# Patient Record
Sex: Male | Born: 1960 | Race: White | Hispanic: No | Marital: Married | Smoking: Former smoker
Health system: Southern US, Community
[De-identification: ages and names within clinical notes are randomized; demographics above are authoritative.]

## PROBLEM LIST (undated history)

## (undated) DIAGNOSIS — E785 Hyperlipidemia, unspecified: Secondary | ICD-10-CM

## (undated) DIAGNOSIS — G4730 Sleep apnea, unspecified: Secondary | ICD-10-CM

## (undated) DIAGNOSIS — T7840XA Allergy, unspecified, initial encounter: Secondary | ICD-10-CM

## (undated) DIAGNOSIS — G709 Myoneural disorder, unspecified: Secondary | ICD-10-CM

## (undated) DIAGNOSIS — N183 Chronic kidney disease, stage 3 unspecified: Secondary | ICD-10-CM

## (undated) DIAGNOSIS — N186 End stage renal disease: Secondary | ICD-10-CM

## (undated) DIAGNOSIS — T8859XA Other complications of anesthesia, initial encounter: Secondary | ICD-10-CM

## (undated) DIAGNOSIS — I3139 Other pericardial effusion (noninflammatory): Secondary | ICD-10-CM

## (undated) DIAGNOSIS — E119 Type 2 diabetes mellitus without complications: Secondary | ICD-10-CM

## (undated) DIAGNOSIS — G4733 Obstructive sleep apnea (adult) (pediatric): Secondary | ICD-10-CM

## (undated) DIAGNOSIS — I313 Pericardial effusion (noninflammatory): Secondary | ICD-10-CM

## (undated) DIAGNOSIS — I10 Essential (primary) hypertension: Secondary | ICD-10-CM

## (undated) DIAGNOSIS — Z992 Dependence on renal dialysis: Secondary | ICD-10-CM

## (undated) DIAGNOSIS — I872 Venous insufficiency (chronic) (peripheral): Secondary | ICD-10-CM

## (undated) DIAGNOSIS — D649 Anemia, unspecified: Secondary | ICD-10-CM

## (undated) DIAGNOSIS — I509 Heart failure, unspecified: Secondary | ICD-10-CM

## (undated) HISTORY — DX: Obstructive sleep apnea (adult) (pediatric): G47.33

## (undated) HISTORY — DX: Myoneural disorder, unspecified: G70.9

## (undated) HISTORY — DX: Anemia, unspecified: D64.9

## (undated) HISTORY — DX: Hyperlipidemia, unspecified: E78.5

## (undated) HISTORY — PX: COLONOSCOPY: SHX174

## (undated) HISTORY — DX: Venous insufficiency (chronic) (peripheral): I87.2

## (undated) HISTORY — DX: Allergy, unspecified, initial encounter: T78.40XA

## (undated) HISTORY — DX: Essential (primary) hypertension: I10

## (undated) HISTORY — DX: Sleep apnea, unspecified: G47.30

## (undated) HISTORY — PX: CARDIAC CATHETERIZATION: SHX172

## (undated) HISTORY — PX: KNEE CARTILAGE SURGERY: SHX688

## (undated) HISTORY — PX: POLYPECTOMY: SHX149

## (undated) HISTORY — DX: Type 2 diabetes mellitus without complications: E11.9

## (undated) HISTORY — PX: OTHER SURGICAL HISTORY: SHX169

---
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
## 1984-01-27 HISTORY — PX: LUMBAR DISC SURGERY: SHX700

## 1985-01-26 HISTORY — PX: OTHER SURGICAL HISTORY: SHX169

## 1998-05-10 ENCOUNTER — Ambulatory Visit (HOSPITAL_COMMUNITY): Admission: RE | Admit: 1998-05-10 | Discharge: 1998-05-10 | Payer: Self-pay | Admitting: Family Medicine

## 1998-05-10 ENCOUNTER — Encounter: Payer: Self-pay | Admitting: Family Medicine

## 2000-03-02 ENCOUNTER — Ambulatory Visit (HOSPITAL_BASED_OUTPATIENT_CLINIC_OR_DEPARTMENT_OTHER): Admission: RE | Admit: 2000-03-02 | Discharge: 2000-03-02 | Payer: Self-pay | Admitting: Family Medicine

## 2000-04-01 ENCOUNTER — Ambulatory Visit (HOSPITAL_BASED_OUTPATIENT_CLINIC_OR_DEPARTMENT_OTHER): Admission: RE | Admit: 2000-04-01 | Discharge: 2000-04-01 | Payer: Self-pay | Admitting: *Deleted

## 2001-01-26 DIAGNOSIS — E119 Type 2 diabetes mellitus without complications: Secondary | ICD-10-CM

## 2001-01-26 HISTORY — DX: Type 2 diabetes mellitus without complications: E11.9

## 2001-06-28 ENCOUNTER — Encounter: Payer: Self-pay | Admitting: *Deleted

## 2001-06-28 ENCOUNTER — Encounter: Admission: RE | Admit: 2001-06-28 | Discharge: 2001-06-28 | Payer: Self-pay | Admitting: *Deleted

## 2003-12-19 ENCOUNTER — Emergency Department (HOSPITAL_COMMUNITY): Admission: EM | Admit: 2003-12-19 | Discharge: 2003-12-19 | Payer: Self-pay | Admitting: Emergency Medicine

## 2004-06-26 ENCOUNTER — Encounter: Admission: RE | Admit: 2004-06-26 | Discharge: 2004-06-26 | Payer: Self-pay | Admitting: Family Medicine

## 2004-07-15 ENCOUNTER — Ambulatory Visit: Payer: Self-pay | Admitting: Internal Medicine

## 2004-08-12 ENCOUNTER — Ambulatory Visit: Payer: Self-pay | Admitting: Internal Medicine

## 2005-09-05 ENCOUNTER — Other Ambulatory Visit: Payer: Self-pay

## 2005-09-05 ENCOUNTER — Inpatient Hospital Stay: Payer: Self-pay | Admitting: Internal Medicine

## 2005-09-08 ENCOUNTER — Ambulatory Visit: Payer: Self-pay | Admitting: Internal Medicine

## 2005-12-14 ENCOUNTER — Ambulatory Visit: Payer: Self-pay | Admitting: Internal Medicine

## 2007-02-05 ENCOUNTER — Encounter: Payer: Self-pay | Admitting: Internal Medicine

## 2007-02-10 ENCOUNTER — Ambulatory Visit: Payer: Self-pay | Admitting: Internal Medicine

## 2007-02-10 DIAGNOSIS — E785 Hyperlipidemia, unspecified: Secondary | ICD-10-CM | POA: Insufficient documentation

## 2007-02-10 DIAGNOSIS — I10 Essential (primary) hypertension: Secondary | ICD-10-CM | POA: Insufficient documentation

## 2007-02-25 ENCOUNTER — Encounter: Payer: Self-pay | Admitting: Internal Medicine

## 2007-03-18 ENCOUNTER — Telehealth: Payer: Self-pay | Admitting: Internal Medicine

## 2007-03-18 ENCOUNTER — Telehealth (INDEPENDENT_AMBULATORY_CARE_PROVIDER_SITE_OTHER): Payer: Self-pay | Admitting: *Deleted

## 2007-03-31 ENCOUNTER — Ambulatory Visit: Payer: Self-pay | Admitting: Internal Medicine

## 2007-03-31 DIAGNOSIS — J329 Chronic sinusitis, unspecified: Secondary | ICD-10-CM | POA: Insufficient documentation

## 2007-04-18 ENCOUNTER — Telehealth (INDEPENDENT_AMBULATORY_CARE_PROVIDER_SITE_OTHER): Payer: Self-pay | Admitting: *Deleted

## 2007-04-29 ENCOUNTER — Ambulatory Visit: Payer: Self-pay | Admitting: Internal Medicine

## 2007-08-18 ENCOUNTER — Telehealth (INDEPENDENT_AMBULATORY_CARE_PROVIDER_SITE_OTHER): Payer: Self-pay | Admitting: *Deleted

## 2007-09-05 ENCOUNTER — Encounter: Payer: Self-pay | Admitting: Internal Medicine

## 2007-09-12 ENCOUNTER — Ambulatory Visit: Payer: Self-pay | Admitting: Internal Medicine

## 2007-09-12 LAB — CONVERTED CEMR LAB: Hgb A1c MFr Bld: 7.8 % — ABNORMAL HIGH (ref 4.6–6.0)

## 2007-09-15 ENCOUNTER — Ambulatory Visit: Payer: Self-pay | Admitting: Internal Medicine

## 2007-09-15 DIAGNOSIS — R5383 Other fatigue: Secondary | ICD-10-CM

## 2007-09-15 DIAGNOSIS — R5381 Other malaise: Secondary | ICD-10-CM | POA: Insufficient documentation

## 2007-09-20 LAB — CONVERTED CEMR LAB
Alkaline Phosphatase: 73 units/L (ref 39–117)
Basophils Absolute: 0.1 10*3/uL (ref 0.0–0.1)
Basophils Relative: 1.3 % (ref 0.0–3.0)
Eosinophils Absolute: 0.2 10*3/uL (ref 0.0–0.7)
Eosinophils Relative: 2.8 % (ref 0.0–5.0)
GFR calc non Af Amer: 58 mL/min
HDL: 29.9 mg/dL — ABNORMAL LOW (ref >39.0)
Hgb A1c MFr Bld: 7.8 % — ABNORMAL HIGH (ref 4.6–6.0)
MCHC: 34.4 g/dL (ref 30.0–36.0)
Monocytes Relative: 3.6 % (ref 3.0–12.0)
Neutrophils Relative %: 66.7 % (ref 43.0–77.0)
Platelets: 202 10*3/uL (ref 150–400)
Potassium: 4.1 meq/L (ref 3.5–5.1)
Total Protein: 6.7 g/dL (ref 6.0–8.3)
Triglycerides: 274 mg/dL (ref 0–149)
WBC: 6.6 10*3/uL (ref 4.5–10.5)

## 2007-10-27 ENCOUNTER — Encounter: Payer: Self-pay | Admitting: Internal Medicine

## 2007-10-27 ENCOUNTER — Ambulatory Visit: Payer: Self-pay | Admitting: Internal Medicine

## 2007-10-27 ENCOUNTER — Ambulatory Visit: Payer: Self-pay | Admitting: Cardiology

## 2007-10-27 ENCOUNTER — Observation Stay (HOSPITAL_COMMUNITY): Admission: EM | Admit: 2007-10-27 | Discharge: 2007-10-28 | Payer: Self-pay | Admitting: Emergency Medicine

## 2007-11-03 ENCOUNTER — Encounter: Payer: Self-pay | Admitting: Internal Medicine

## 2007-11-09 ENCOUNTER — Telehealth: Payer: Self-pay | Admitting: Internal Medicine

## 2007-11-09 ENCOUNTER — Encounter: Payer: Self-pay | Admitting: Internal Medicine

## 2007-11-30 ENCOUNTER — Telehealth: Payer: Self-pay | Admitting: Internal Medicine

## 2008-01-05 ENCOUNTER — Ambulatory Visit: Payer: Self-pay | Admitting: Family Medicine

## 2008-01-05 DIAGNOSIS — R1013 Epigastric pain: Secondary | ICD-10-CM | POA: Insufficient documentation

## 2008-01-11 LAB — CONVERTED CEMR LAB
AST: 22 units/L (ref 0–37)
Albumin: 3.2 g/dL — ABNORMAL LOW (ref 3.5–5.2)
BUN: 16 mg/dL (ref 6–23)
Basophils Absolute: 0 10*3/uL (ref 0.0–0.1)
Chloride: 105 meq/L (ref 96–112)
Creatinine, Ser: 1 mg/dL (ref 0.4–1.5)
Eosinophils Absolute: 0.2 10*3/uL (ref 0.0–0.7)
Eosinophils Relative: 2.5 % (ref 0.0–5.0)
GFR calc Af Amer: 103 mL/min
GFR calc non Af Amer: 86 mL/min
Glucose, Bld: 121 mg/dL — ABNORMAL HIGH (ref 70–99)
HCT: 46.2 % (ref 39.0–52.0)
Lipase: 33 units/L (ref 11.0–59.0)
Lymphocytes Relative: 20.3 % (ref 12.0–46.0)
MCHC: 34.6 g/dL (ref 30.0–36.0)
MCV: 82.9 fL (ref 78.0–100.0)
Monocytes Absolute: 0.6 10*3/uL (ref 0.1–1.0)
Monocytes Relative: 6.8 % (ref 3.0–12.0)
Neutro Abs: 6 10*3/uL (ref 1.4–7.7)
RDW: 12.9 % (ref 11.5–14.6)
Sodium: 142 meq/L (ref 135–145)
WBC: 8.5 10*3/uL (ref 4.5–10.5)

## 2008-02-17 ENCOUNTER — Ambulatory Visit: Payer: Self-pay | Admitting: Internal Medicine

## 2008-02-17 DIAGNOSIS — N471 Phimosis: Secondary | ICD-10-CM | POA: Insufficient documentation

## 2008-02-17 DIAGNOSIS — N478 Other disorders of prepuce: Secondary | ICD-10-CM

## 2008-02-21 LAB — CONVERTED CEMR LAB
ALT: 44 units/L (ref 0–53)
AST: 39 units/L — ABNORMAL HIGH (ref 0–37)
BUN: 16 mg/dL (ref 6–23)
Calcium: 9.4 mg/dL (ref 8.4–10.5)
Cholesterol: 85 mg/dL (ref 0–200)
GFR calc non Af Amer: 77 mL/min
Hgb A1c MFr Bld: 8.5 % — ABNORMAL HIGH (ref 4.6–6.0)
Phosphorus: 3.8 mg/dL (ref 2.3–4.6)
Total Bilirubin: 1.7 mg/dL — ABNORMAL HIGH (ref 0.3–1.2)
Triglycerides: 128 mg/dL (ref 0–149)
VLDL: 26 mg/dL (ref 0–40)

## 2008-03-19 ENCOUNTER — Ambulatory Visit: Payer: Self-pay | Admitting: Internal Medicine

## 2008-03-20 LAB — CONVERTED CEMR LAB
BUN: 17 mg/dL (ref 6–23)
GFR calc Af Amer: 103 mL/min
Phosphorus: 3.6 mg/dL (ref 2.3–4.6)
Potassium: 3.5 meq/L (ref 3.5–5.1)

## 2008-06-08 ENCOUNTER — Ambulatory Visit: Payer: Self-pay | Admitting: Internal Medicine

## 2008-06-08 DIAGNOSIS — L538 Other specified erythematous conditions: Secondary | ICD-10-CM | POA: Insufficient documentation

## 2008-06-18 ENCOUNTER — Telehealth: Payer: Self-pay | Admitting: Internal Medicine

## 2008-08-02 ENCOUNTER — Telehealth: Payer: Self-pay | Admitting: Internal Medicine

## 2008-08-06 ENCOUNTER — Ambulatory Visit: Payer: Self-pay | Admitting: Internal Medicine

## 2008-08-07 LAB — CONVERTED CEMR LAB
ALT: 32 units/L (ref 0–53)
Albumin: 3.8 g/dL (ref 3.5–5.2)
Alkaline Phosphatase: 91 units/L (ref 39–117)
Basophils Absolute: 0 10*3/uL (ref 0.0–0.1)
Calcium: 9.6 mg/dL (ref 8.4–10.5)
Cholesterol: 159 mg/dL (ref 0–200)
Creatinine,U: 166.1 mg/dL
Eosinophils Relative: 3.1 % (ref 0.0–5.0)
Glucose, Bld: 106 mg/dL — ABNORMAL HIGH (ref 70–99)
HCT: 49.5 % (ref 39.0–52.0)
HDL: 37.9 mg/dL — ABNORMAL LOW (ref >39.00)
Hemoglobin: 17 g/dL (ref 13.0–17.0)
Hgb A1c MFr Bld: 10.3 % — ABNORMAL HIGH (ref 4.6–6.5)
MCV: 84.5 fL (ref 78.0–100.0)
Microalb, Ur: 142.2 mg/dL — ABNORMAL HIGH (ref 0.0–1.9)
Monocytes Relative: 3.5 % (ref 3.0–12.0)
Neutro Abs: 5.1 10*3/uL (ref 1.4–7.7)
Platelets: 194 10*3/uL (ref 150.0–400.0)
RBC: 5.85 M/uL — ABNORMAL HIGH (ref 4.22–5.81)
RDW: 13.7 % (ref 11.5–14.6)
TSH: 0.67 microintl units/mL (ref 0.35–5.50)
Total Bilirubin: 1.6 mg/dL — ABNORMAL HIGH (ref 0.3–1.2)
Total CHOL/HDL Ratio: 4
Total Protein: 7 g/dL (ref 6.0–8.3)
Triglycerides: 296 mg/dL — ABNORMAL HIGH (ref 0.0–149.0)
VLDL: 59.2 mg/dL — ABNORMAL HIGH (ref 0.0–40.0)

## 2008-08-28 ENCOUNTER — Encounter: Payer: Self-pay | Admitting: Internal Medicine

## 2008-08-30 ENCOUNTER — Encounter (INDEPENDENT_AMBULATORY_CARE_PROVIDER_SITE_OTHER): Payer: Self-pay | Admitting: *Deleted

## 2008-09-27 ENCOUNTER — Ambulatory Visit (HOSPITAL_BASED_OUTPATIENT_CLINIC_OR_DEPARTMENT_OTHER): Admission: RE | Admit: 2008-09-27 | Discharge: 2008-09-27 | Payer: Self-pay | Admitting: Urology

## 2008-10-05 ENCOUNTER — Encounter: Payer: Self-pay | Admitting: Internal Medicine

## 2008-11-12 ENCOUNTER — Ambulatory Visit: Payer: Self-pay | Admitting: Internal Medicine

## 2009-01-01 ENCOUNTER — Telehealth: Payer: Self-pay | Admitting: Internal Medicine

## 2009-01-28 ENCOUNTER — Encounter: Payer: Self-pay | Admitting: Internal Medicine

## 2009-01-28 ENCOUNTER — Ambulatory Visit: Payer: Self-pay | Admitting: Family Medicine

## 2009-01-28 ENCOUNTER — Encounter: Admission: RE | Admit: 2009-01-28 | Discharge: 2009-04-28 | Payer: Self-pay | Admitting: Internal Medicine

## 2009-01-28 DIAGNOSIS — M5380 Other specified dorsopathies, site unspecified: Secondary | ICD-10-CM | POA: Insufficient documentation

## 2009-02-06 ENCOUNTER — Ambulatory Visit: Payer: Self-pay | Admitting: Internal Medicine

## 2009-02-06 ENCOUNTER — Telehealth: Payer: Self-pay | Admitting: Internal Medicine

## 2009-03-01 ENCOUNTER — Telehealth: Payer: Self-pay | Admitting: Family Medicine

## 2009-03-04 ENCOUNTER — Telehealth: Payer: Self-pay | Admitting: Internal Medicine

## 2009-03-25 ENCOUNTER — Ambulatory Visit: Payer: Self-pay | Admitting: Internal Medicine

## 2009-04-05 ENCOUNTER — Ambulatory Visit: Payer: Self-pay | Admitting: Internal Medicine

## 2009-04-05 DIAGNOSIS — R109 Unspecified abdominal pain: Secondary | ICD-10-CM | POA: Insufficient documentation

## 2009-04-05 LAB — CONVERTED CEMR LAB
Protein, U semiquant: >=300
Urobilinogen, UA: 0.2

## 2009-10-11 ENCOUNTER — Ambulatory Visit: Payer: Self-pay | Admitting: Internal Medicine

## 2009-10-11 DIAGNOSIS — J019 Acute sinusitis, unspecified: Secondary | ICD-10-CM | POA: Insufficient documentation

## 2009-10-15 ENCOUNTER — Encounter: Admission: RE | Admit: 2009-10-15 | Discharge: 2009-10-15 | Payer: Self-pay | Admitting: Occupational Medicine

## 2009-11-13 ENCOUNTER — Ambulatory Visit: Payer: Self-pay | Admitting: Internal Medicine

## 2009-11-13 LAB — HM DIABETES FOOT EXAM

## 2009-11-14 LAB — CONVERTED CEMR LAB
Albumin: 4 g/dL (ref 3.5–5.2)
Alkaline Phosphatase: 114 units/L (ref 39–117)
BUN: 27 mg/dL — ABNORMAL HIGH (ref 6–23)
Basophils Relative: 0.5 % (ref 0.0–3.0)
Bilirubin, Direct: 0.2 mg/dL (ref 0.0–0.3)
CO2: 30 meq/L (ref 19–32)
Chloride: 99 meq/L (ref 96–112)
Cholesterol: 123 mg/dL (ref 0–200)
Creatinine, Ser: 1.1 mg/dL (ref 0.4–1.5)
GFR calc non Af Amer: 80.01 mL/min (ref >60)
Glucose, Bld: 383 mg/dL — ABNORMAL HIGH (ref 70–99)
HCT: 50.6 % (ref 39.0–52.0)
Hgb A1c MFr Bld: 11.9 % — ABNORMAL HIGH (ref 4.6–6.5)
Lymphocytes Relative: 25.8 % (ref 12.0–46.0)
Neutrophils Relative %: 62.3 % (ref 43.0–77.0)
Phosphorus: 3.7 mg/dL (ref 2.3–4.6)
Platelets: 199 10*3/uL (ref 150.0–400.0)
RBC: 5.94 M/uL — ABNORMAL HIGH (ref 4.22–5.81)
TSH: 0.74 microintl units/mL (ref 0.35–5.50)
Total Bilirubin: 1.4 mg/dL — ABNORMAL HIGH (ref 0.3–1.2)
Total Protein: 6.9 g/dL (ref 6.0–8.3)
Triglycerides: 736 mg/dL — ABNORMAL HIGH (ref 0.0–149.0)
VLDL: 147.2 mg/dL — ABNORMAL HIGH (ref 0.0–40.0)

## 2010-01-06 ENCOUNTER — Encounter: Payer: Self-pay | Admitting: Internal Medicine

## 2010-01-06 LAB — HM DIABETES EYE EXAM

## 2010-02-16 ENCOUNTER — Encounter: Payer: Self-pay | Admitting: Family Medicine

## 2010-02-25 NOTE — Progress Notes (Signed)
Summary: needs written scripts  Phone Note Call from Patient Call back at [phone]   Caller: Patient Call For: Claris Gower MD Summary of Call: Pt is going to [place] next month and needs written scripts for all his meds that he can show to officials there.  He is not in a hurry for this. Please call when ready. Initial call taken by: Marty Heck CMA,  March 04, 2009 10:51 AM  Follow-up for Phone Call        does he just need a list or actual prescriptions Claris Gower MD  March 04, 2009 1:27 PM   patient just need a list that show's that Dr. Silvio Pate prescribed these rx's. Per Dr. Silvio Pate ok to send list to patient's home address. Follow-up by: Edwin Dada CMA Deborra Medina),  March 05, 2009 12:20 PM
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
## 2010-02-25 NOTE — Letter (Signed)
Summary: Patient Did Not Keep Appt./[facility]  Patient Did Not Keep Appt./[facility]   Imported By: Edmonia [name] 02/11/2009 10:41:38  _____________________________________________________________________  External Attachment:    Type:   Image     Comment:   External Document  Appended Document: Patient Did Not Keep Appt./[facility] cancelled due to pulled back

## 2010-02-25 NOTE — Letter (Signed)
Summary: [facility]  [facility]   Imported By: Edmonia [name] 01/30/2009 10:06:31  _____________________________________________________________________  External Attachment:    Type:   Image     Comment:   External Document  Appended Document: [facility] initial eval done
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
## 2010-02-25 NOTE — Progress Notes (Signed)
Summary: Muscle spasms  Phone Note Call from Patient   Caller: Patient Call For: Claris Gower MD Summary of Call: Patient called and said that he is still having muscle spasms in his back and it is no better since last week.  Offered him an appt with Dr. Lorelei Pont tomorrow, but he advised me that he has a 2:00 appt today with Dr. Silvio Pate.   Initial call taken by: Sherrian Divers CMA [facility]),  February 06, 2009 11:13 AM
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
## 2010-02-25 NOTE — Progress Notes (Signed)
Summary: Lisinopril   Phone Note Refill Request Call back at [phone] Message from:  Patient on March 01, 2009 9:43 AM  Refills Requested: Medication #1:  LISINOPRIL-HYDROCHLOROTHIAZIDE 20-12.5 MG TABS 2 tabs daily [facility]  Initial call taken by: Lacretia Nicks,  March 01, 2009 9:43 AM  Follow-up for Phone Call        Refilled by other means. Follow-up by: Christena Deem CMA (AAMA),  March 01, 2009 10:29 AM    Prescriptions: LISINOPRIL-HYDROCHLOROTHIAZIDE 20-12.5 MG TABS (LISINOPRIL-HYDROCHLOROTHIAZIDE) 2 tabs daily  #60 x 12   Entered and Authorized by:   Arnette Norris MD   Signed by:   Arnette Norris MD on 03/01/2009   Method used:   [facility]        [facility] [facility]       [address]       [place], [place]  [address]       Ph: [phone] or [phone]       Fax: [phone]   RxID:   [id number]
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
## 2010-03-03 ENCOUNTER — Other Ambulatory Visit: Payer: Self-pay

## 2010-03-05 NOTE — Letter (Signed)
Summary: [place]  [facility]   Imported By: Jamelle Haring 02/28/2010 11:38:57  _____________________________________________________________________  External Attachment:    Type:   Image     Comment:   External Document  Appended Document: [facility]    Clinical Lists Changes  Observations: Added new observation of DIAB EYE EX: No diabetic retinopathy.   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

## 2010-03-12 ENCOUNTER — Encounter: Payer: Self-pay | Admitting: *Deleted

## 2010-03-12 DIAGNOSIS — I872 Venous insufficiency (chronic) (peripheral): Secondary | ICD-10-CM

## 2010-03-12 DIAGNOSIS — G4733 Obstructive sleep apnea (adult) (pediatric): Secondary | ICD-10-CM

## 2010-03-12 DIAGNOSIS — E119 Type 2 diabetes mellitus without complications: Secondary | ICD-10-CM

## 2010-03-12 DIAGNOSIS — E785 Hyperlipidemia, unspecified: Secondary | ICD-10-CM

## 2010-03-12 DIAGNOSIS — I10 Essential (primary) hypertension: Secondary | ICD-10-CM

## 2010-04-26 ENCOUNTER — Other Ambulatory Visit: Payer: Self-pay | Admitting: Internal Medicine

## 2010-04-28 ENCOUNTER — Other Ambulatory Visit: Payer: Self-pay | Admitting: *Deleted

## 2010-04-29 ENCOUNTER — Other Ambulatory Visit: Payer: Self-pay | Admitting: *Deleted

## 2010-04-29 MED ORDER — "INSULIN SYRINGE-NEEDLE U-100 30G X 1/2"" 1 ML MISC": Status: DC

## 2010-05-02 LAB — POCT I-STAT 4, (NA,K, GLUC, HGB,HCT)
HCT: 49 % (ref 39.0–52.0)
Sodium: 140 mEq/L (ref 135–145)

## 2010-05-07 ENCOUNTER — Ambulatory Visit (INDEPENDENT_AMBULATORY_CARE_PROVIDER_SITE_OTHER): Payer: 59 | Admitting: Internal Medicine

## 2010-05-07 ENCOUNTER — Encounter: Payer: Self-pay | Admitting: Internal Medicine

## 2010-05-07 VITALS — BP 148/80 | HR 70 | Temp 98.6°F | Ht 72.0 in | Wt 282.0 lb

## 2010-05-07 DIAGNOSIS — I10 Essential (primary) hypertension: Secondary | ICD-10-CM

## 2010-05-07 DIAGNOSIS — Z0000 Encounter for general adult medical examination without abnormal findings: Secondary | ICD-10-CM | POA: Insufficient documentation

## 2010-05-07 DIAGNOSIS — IMO0001 Reserved for inherently not codable concepts without codable children: Secondary | ICD-10-CM

## 2010-05-07 DIAGNOSIS — E785 Hyperlipidemia, unspecified: Secondary | ICD-10-CM

## 2010-05-07 LAB — CBC WITH DIFFERENTIAL/PLATELET
Basophils Absolute: 0 10*3/uL (ref 0.0–0.1)
Eosinophils Absolute: 0.4 10*3/uL (ref 0.0–0.7)
Eosinophils Relative: 4.9 % (ref 0.0–5.0)
HCT: 49.3 % (ref 39.0–52.0)
Hemoglobin: 16.9 g/dL (ref 13.0–17.0)
Lymphs Abs: 2.1 10*3/uL (ref 0.7–4.0)
MCHC: 34.3 g/dL (ref 30.0–36.0)
MCV: 85.4 fl (ref 78.0–100.0)
Monocytes Absolute: 0.5 10*3/uL (ref 0.1–1.0)
Neutrophils Relative %: 62.5 % (ref 43.0–77.0)

## 2010-05-07 LAB — LIPID PANEL
Cholesterol: 110 mg/dL (ref 0–200)
Triglycerides: 471 mg/dL — ABNORMAL HIGH (ref 0.0–149.0)
VLDL: 94.2 mg/dL — ABNORMAL HIGH (ref 0.0–40.0)

## 2010-05-07 LAB — HEPATIC FUNCTION PANEL
ALT: 32 U/L (ref 0–53)
Albumin: 3.7 g/dL (ref 3.5–5.2)
Alkaline Phosphatase: 92 U/L (ref 39–117)
Total Protein: 6.7 g/dL (ref 6.0–8.3)

## 2010-05-07 LAB — MICROALBUMIN / CREATININE URINE RATIO
Creatinine,U: 89.9 mg/dL
Microalb, Ur: 100 mg/dL — ABNORMAL HIGH (ref 0.0–1.9)

## 2010-05-07 LAB — BASIC METABOLIC PANEL
Creatinine, Ser: 1.2 mg/dL (ref 0.4–1.5)
GFR: 68.45 mL/min (ref >60.00)

## 2010-05-07 LAB — TSH: TSH: 0.74 u[IU]/mL (ref 0.35–5.50)

## 2010-05-07 LAB — HEMOGLOBIN A1C: Hgb A1c MFr Bld: 12.2 % — ABNORMAL HIGH (ref 4.6–6.5)
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
## 2010-05-13 ENCOUNTER — Telehealth: Payer: Self-pay | Admitting: *Deleted

## 2010-05-13 NOTE — Telephone Encounter (Signed)
Spoke with patient and schedule lab appt in 66mths

## 2010-05-13 NOTE — Telephone Encounter (Signed)
Message copied by Edwin Dada on Tue May 13, 2010  2:31 PM ------      Message from: Viviana Simpler      Created: Fri May 09, 2010  8:10 AM       Please set up an A1c in about 3 months

## 2010-05-15 ENCOUNTER — Other Ambulatory Visit: Payer: Self-pay | Admitting: Internal Medicine

## 2010-05-20 ENCOUNTER — Other Ambulatory Visit: Payer: Self-pay | Admitting: Internal Medicine

## 2010-06-10 NOTE — Op Note (Signed)
NAME:  Samuel Reed, Samuel Reed               ACCOUNT NO.:  [id number]   MEDICAL RECORD NO.:  [id number]          PATIENT TYPE:  AMB   LOCATION:  [facility]                         FACILITY:  [facility]   PHYSICIAN:  Sigmund I. Gaynelle Arabian, M.D.DATE OF BIRTH:  1960/02/18   DATE OF PROCEDURE:  09/27/2008  DATE OF DISCHARGE:                               OPERATIVE REPORT   PREOPERATIVE DIAGNOSIS:  Diabetic phimosis.   POSTOPERATIVE DIAGNOSIS:  Diabetic phimosis.   OPERATION:  Circumcision.   SURGEON:  Sigmund I. Gaynelle Arabian, M.D.   ANESTHESIA:  General LMA.   PREPARATION:  After appropriate preanesthesia, the patient was brought  to the operating room and placed on the operating room table in the  dorsal supine position where general LMA anesthesia was introduced.  He  remained in the supine position, but in some Trendelenburg position,  where the penis was prepped with Betadine solution and draped in the  usual fashion.  The foreskin could not be reduced over the glans,  therefore Betadine was left on the table to wash the penis when dorsal  slit circumcision was accomplished.   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   His history is a 50 year old diabetic male, with painful intercourse  secondary to scarring of the foreskin because of repeated trauma and  poor blood supply.  He is now for circumcision.   PROCEDURE:  A dorsal slit was accomplished after clamping the foreskin  at the 12 o'clock position, and incising the foreskin.  Circumcising  markings and incisions were made in the pericoronal and periglandular  area, and thickened, redundant tight foreskin was removed.  The base of  the penis was injected with 0.25 plain Marcaine.  Following this, no  bleeding was noted.  The penis was then closed with four separate  quadrants created with 4-0 Monocryl suture, and each quadrant closed  with interrupted 4-0 Monocryl suture.  Dermabond  was placed around the  penis.  The patient tolerated well.  He was awakened and taken to the  recovery room in good condition.      Sigmund I. Gaynelle Arabian, M.D.  Electronically Signed     SIT/MEDQ  D:  09/27/2008  T:  09/27/2008  Job:  [id number]   cc:   Earnstine Regal, Dr.  Dion Body

## 2010-06-13 NOTE — Discharge Summary (Signed)
NAMEMarland Kitchen  REID, BASTYR NO.:  [id number]   MEDICAL RECORD NO.:  [id number]          PATIENT TYPE:  OBV   LOCATION:  [id number]                         FACILITY:  [facility]   PHYSICIAN:  Valerie A. Asa Lente, MDDATE OF BIRTH:  [birth year]   DATE OF ADMISSION:  10/27/2007  DATE OF DISCHARGE:  10/28/2007                               DISCHARGE SUMMARY   DISCHARGE DIAGNOSES:  1. Chest pain of unclear etiology, cardiac enzymes negative at this      admission, a 2-D echo nondiagnostic with plans for outpatient      Myoview.  2. Hypertriglyceridemia.  3. Diabetes type 2.  4. Hypertension.  5. Chronic venous insufficiency.  6. Mild hypokalemia.  7. Obesity.   HISTORY OF PRESENT ILLNESS:  Mr. Raman is a 50 year old male who was  admitted on October 27, 2007, with a chief complaint of chest pain.  He  presented to the emergency department with a complaint of chest pain  which woke him from sleep at 1 a.m. in the morning.  He noted that the  pain started on the right side and radiated down to his right arm.  He  denies associated nausea.  Pain persisted until nitroglycerin was given  approximately 1 hour prior to his pill taking at which time, his pain  dropped to 2/10.  He noted positive shortness of breath last night, some  exacerbation of his chest pain with deep inspiration.  He also reported  negative cardiac catheterization 2 years ago at [facility] and negative  catheterization 4 years ago performed by Dr. Rollene Fare.  He was admitted  for further evaluation and treatment.   PAST MEDICAL HISTORY:  1. Diabetes type 2.  2. Hyperlipidemia.  3. Hypertension.  4. Chronic venous insufficiency.  5. Diskectomy in 1986.  6. Back procedure in 1987.  7. Cardiac cath 2006, reportedly negative with Dr. Rollene Fare.  8. Cardiac cath, August 2007, negative in [place] per patient.   COURSE OF HOSPITALIZATION:  Chest pain.  The patient was admitted.  He  underwent serial cardiac  enzymes which were negative x3.  A 2-D echo was  limited study during this admission with difficulty assessing all the  walls.  Cardiology consult was requested, and the patient was seen by  [facility].  It was recommended that he have an  outpatient Myoview which was scheduled by their team.  He was ultimately  discharged to home.   DISCHARGE MEDICATIONS:  1. Aspirin 81 mg p.o. daily.  2. Metoprolol 100 mg p.o. b.i.d.  3. Hydralazine 25 mg p.o. 4 times daily.  4. Metformin 1000 mg p.o. b.i.d.  5. Actos 45 mg p.o. daily.  6. Micardis HCT 80/12.5 p.o. b.i.d.  7. Novolin 70/30, 50 units each a.m. and 40 units each p.m.  8. TriCor 145 mg p.o. daily.  9. Crestor 20 mg p.o. daily.  10.Darvocet-N 100, 1-2 tab p.o. q.6 h. p.r.n. pain.   PERTINENT LABORATORY DATA:  At the time of his discharge, serial cardiac  enzymes negative x3.  Hemoglobin 16.1, hematocrit 47.9, BUN 16,  creatinine 1.1, and potassium 3.4.  FOLLOWUP:  The patient instructed to follow up with Dr. Donnelly Stager and to  have Myoview performed on November 03, 2007, at 8 a.m., to follow up on  November 09, 2007, with Dr. Einar Gip.      Debbrah Alar, NP      Jannifer Rodney. Asa Lente, MD  Electronically Signed    MO/MEDQ  D:  12/16/2007  T:  12/17/2007  Job:  [id number]

## 2010-06-24 ENCOUNTER — Other Ambulatory Visit: Payer: Self-pay | Admitting: *Deleted

## 2010-06-24 MED ORDER — FLUTICASONE PROPIONATE 50 MCG/ACT NA SUSP: 2.0000 | Freq: Two times a day (BID) | NASAL | Status: DC

## 2010-08-04 ENCOUNTER — Other Ambulatory Visit: Payer: 59

## 2010-08-11 ENCOUNTER — Other Ambulatory Visit (INDEPENDENT_AMBULATORY_CARE_PROVIDER_SITE_OTHER): Payer: 59 | Admitting: Internal Medicine

## 2010-08-11 DIAGNOSIS — E119 Type 2 diabetes mellitus without complications: Secondary | ICD-10-CM

## 2010-08-12 ENCOUNTER — Telehealth: Payer: Self-pay | Admitting: *Deleted

## 2010-08-12 MED ORDER — SITAGLIPTIN PHOSPHATE 100 MG PO TABS: 100.0000 mg | ORAL_TABLET | Freq: Every day | ORAL | Status: DC

## 2010-08-12 NOTE — Telephone Encounter (Signed)
Message copied by Despina Hidden on Tue Aug 12, 2010  9:08 AM ------      Message from: Viviana Simpler I      Created: Mon Aug 11, 2010  1:56 PM       Please call      It is very frustrating but the sugar control is not any better      A1c up to 12.7%      Please start [place] 100mg  daily (1 year Rx)      Please make sure he has follow up appt within 3 months----we will recheck labs then      He should call if any problems---stomach upset is probably the most likely side effect

## 2010-08-12 NOTE — Telephone Encounter (Signed)
rx sent to pharmacy by e-script , Spoke with patient and advised results

## 2010-09-17 ENCOUNTER — Other Ambulatory Visit: Payer: Self-pay | Admitting: Internal Medicine

## 2010-09-18 ENCOUNTER — Encounter: Payer: Self-pay | Admitting: Internal Medicine

## 2010-10-28 LAB — CBC
HCT: 47.9
MCHC: 33.7
MCV: 84.2
Platelets: 200
RDW: 13.8

## 2010-10-28 LAB — POCT I-STAT, CHEM 8
BUN: 16
Calcium, Ion: 1.07 — ABNORMAL LOW
Chloride: 107
Glucose, Bld: 198 — ABNORMAL HIGH

## 2010-10-28 LAB — GLUCOSE, CAPILLARY
Glucose-Capillary: 124 — ABNORMAL HIGH
Glucose-Capillary: 177 — ABNORMAL HIGH
Glucose-Capillary: 188 — ABNORMAL HIGH

## 2010-10-28 LAB — LIPID PANEL
HDL: 22 — ABNORMAL LOW
Triglycerides: 426 — ABNORMAL HIGH
VLDL: UNDETERMINED

## 2010-10-28 LAB — POCT CARDIAC MARKERS
CKMB, poc: <1 — ABNORMAL LOW
Myoglobin, poc: 78.2

## 2010-10-28 LAB — CARDIAC PANEL(CRET KIN+CKTOT+MB+TROPI)
CK, MB: 1.6
Relative Index: INVALID
Total CK: 82
Troponin I: 0.02

## 2010-10-28 LAB — DIFFERENTIAL
Basophils Absolute: 0
Eosinophils Absolute: 0.2
Eosinophils Relative: 3
Lymphocytes Relative: 28
Lymphs Abs: 1.7
Monocytes Absolute: 0.4

## 2010-10-28 LAB — HEMOGLOBIN A1C: Mean Plasma Glucose: 174

## 2010-11-10 ENCOUNTER — Ambulatory Visit: Payer: Self-pay | Admitting: Internal Medicine

## 2010-11-21 ENCOUNTER — Ambulatory Visit: Payer: Self-pay | Admitting: Internal Medicine

## 2010-11-28 ENCOUNTER — Ambulatory Visit: Payer: Self-pay | Admitting: Internal Medicine

## 2010-12-01 ENCOUNTER — Ambulatory Visit: Payer: Self-pay | Admitting: Internal Medicine

## 2010-12-05 ENCOUNTER — Ambulatory Visit: Payer: Self-pay | Admitting: Internal Medicine

## 2010-12-10 ENCOUNTER — Encounter: Payer: Self-pay | Admitting: Internal Medicine

## 2010-12-10 ENCOUNTER — Ambulatory Visit (INDEPENDENT_AMBULATORY_CARE_PROVIDER_SITE_OTHER): Payer: 59 | Admitting: Internal Medicine

## 2010-12-10 VITALS — BP 174/83 | HR 66 | Temp 98.1°F | Ht 72.0 in | Wt 280.0 lb

## 2010-12-10 DIAGNOSIS — G4733 Obstructive sleep apnea (adult) (pediatric): Secondary | ICD-10-CM

## 2010-12-10 DIAGNOSIS — IMO0001 Reserved for inherently not codable concepts without codable children: Secondary | ICD-10-CM

## 2010-12-10 DIAGNOSIS — I10 Essential (primary) hypertension: Secondary | ICD-10-CM

## 2010-12-10 DIAGNOSIS — N529 Male erectile dysfunction, unspecified: Secondary | ICD-10-CM

## 2010-12-10 DIAGNOSIS — E785 Hyperlipidemia, unspecified: Secondary | ICD-10-CM

## 2010-12-10 LAB — BASIC METABOLIC PANEL
BUN: 15 mg/dL (ref 6–23)
CO2: 29 mEq/L (ref 19–32)
Chloride: 101 mEq/L (ref 96–112)
Creatinine, Ser: 1.1 mg/dL (ref 0.4–1.5)

## 2010-12-10 LAB — HEMOGLOBIN A1C: Hgb A1c MFr Bld: 9.9 % — ABNORMAL HIGH (ref 4.6–6.5)

## 2010-12-10 MED ORDER — AMLODIPINE BESYLATE 10 MG PO TABS: 10.0000 mg | ORAL_TABLET | Freq: Every day | ORAL | Status: DC

## 2010-12-10 MED ORDER — VARDENAFIL HCL 20 MG PO TABS: 10.0000 mg | ORAL_TABLET | Freq: Every day | ORAL | Status: DC | PRN

## 2010-12-10 NOTE — Assessment & Plan Note (Signed)
Having significant problems with ED Will try levitra 20

## 2010-12-10 NOTE — Assessment & Plan Note (Signed)
Lab Results  Component Value Date   LDLCALC 29 02/17/2008   Chronically high triglycerides but excellent LDL

## 2010-12-10 NOTE — Patient Instructions (Signed)
Please increase the amlodipine to 10 mg daily
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
## 2010-12-10 NOTE — Assessment & Plan Note (Addendum)
BP Readings from Last 3 Encounters:  12/10/10 174/83  05/07/10 148/80  11/13/09 160/98   Repeat 170/90 by me on right Will double the amlodipine Plan BP check in 2 months if not going to endocrine

## 2010-12-10 NOTE — Assessment & Plan Note (Signed)
Lab Results  Component Value Date   HGBA1C 12.7* 08/11/2010   Tolerating januvia but I don't think it will bring his levels down adequately Still has some dietary indiscretions but hard to tell how much If A1c still above 10%, will refer to Dr Loanne Drilling

## 2010-12-10 NOTE — Assessment & Plan Note (Signed)
Does well on the CPAP Doesn't seem to be causing problems with appetite or BP

## 2010-12-15 ENCOUNTER — Other Ambulatory Visit: Payer: Self-pay | Admitting: *Deleted

## 2010-12-15 MED ORDER — FLUTICASONE PROPIONATE 50 MCG/ACT NA SUSP: 2.0000 | Freq: Two times a day (BID) | NASAL | Status: DC

## 2011-01-02 ENCOUNTER — Ambulatory Visit: Payer: 59 | Admitting: Internal Medicine

## 2011-01-16 ENCOUNTER — Ambulatory Visit (INDEPENDENT_AMBULATORY_CARE_PROVIDER_SITE_OTHER): Payer: 59 | Admitting: Family Medicine

## 2011-01-16 ENCOUNTER — Encounter: Payer: Self-pay | Admitting: Family Medicine

## 2011-01-16 VITALS — BP 152/88 | HR 76 | Temp 98.4°F | Wt 290.0 lb

## 2011-01-16 DIAGNOSIS — R21 Rash and other nonspecific skin eruption: Secondary | ICD-10-CM | POA: Insufficient documentation

## 2011-01-16 MED ORDER — ACYCLOVIR 800 MG PO TABS: 800.0000 mg | ORAL_TABLET | Freq: Every day | ORAL | Status: DC

## 2011-01-16 MED ORDER — FLUTICASONE PROPIONATE 50 MCG/ACT NA SUSP: 2.0000 | Freq: Two times a day (BID) | NASAL | Status: DC

## 2011-01-16 NOTE — Progress Notes (Signed)
  Subjective:    Patient ID: Samuel Reed, male    DOB: May 05, 1960, 50 y.o.   MRN: [id number]  HPI CC: ? Herpes  Having right sided abd discomfort.  Starts RUQ, runs to flank.  Very superficial, pain to touch not deep palpation.  No rash.  Discomfort described as burning tingling.  Has been going on for 3-4 days.  Feeling mild indigestion.  Not associated with any food.  No fevers/chills, n/v/d/c, BM changes, no urinary changes, dysuria, frequency or urgency.  No blood in stool.    Never had shingles.  Has had chicken pox as child.  No sick contacts at home.  No smokers at home.  Lab Results  Component Value Date   HGBA1C 9.9* 12/10/2010  Sugars running high.  Working hard on this.  Review of Systems Per HPI    Objective:   Physical Exam  Nursing note and vitals reviewed. Constitutional: He appears well-developed and well-nourished. No distress.       obese  Cardiovascular: Normal rate, regular rhythm, normal heart sounds and intact distal pulses.   No murmur heard. Pulmonary/Chest: Effort normal and breath sounds normal. No respiratory distress. He has no wheezes. He has no rales.  Abdominal: Soft. Bowel sounds are normal. He exhibits no distension. There is no tenderness. There is no guarding.  Musculoskeletal: He exhibits edema (1+ pitting edema BLE).  Skin: Skin is warm and dry.          Papules or tiny vesicles on left mid back, some in clusters with surrounding erythema  Psychiatric: He has a normal mood and affect.       Assessment & Plan:

## 2011-01-16 NOTE — Assessment & Plan Note (Signed)
Given story and new rash on back, will treat as shingles. Update [place] if not improving as expected. Pt opts for cheaper antiviral.

## 2011-01-16 NOTE — Patient Instructions (Signed)
Looks like mild case of shingles. Take acyclovir 800mg  5 times daily for 7 days. Update [place] if spreading or not improving as expected. For discomfort use ibuprofen or tylenol.  Shingles Shingles is caused by the same virus that causes chickenpox (varicella zoster virus or VZV). Shingles often occurs many years or decades after having chickenpox. That is why it is more common in adults older than 50 years. The virus reactivates and breaks out as an infection in a nerve root. SYMPTOMS   The initial feeling (sensations) may be pain. This pain is usually described as:   Burning.   Stabbing.   Throbbing.   Tingling in the nerve root.   A red rash will follow in a couple days. The rash may occur in any area of the body and is usually on one side (unilateral) of the body in a band or belt-like pattern. The rash usually starts out as very small blisters (vesicles). They will dry up after 7 to 10 days. This is not usually a significant problem except for the pain it causes.   Long-lasting (chronic) pain is more likely in an elderly person. It can last months to years. This condition is called postherpetic neuralgia.  Shingles can be an extremely severe infection in someone with AIDS, a weakened immune system, or with forms of leukemia. It can also be severe if you are taking transplant medicines or other medicines that weaken the immune system. TREATMENT  Your caregiver will often treat you with:  Antiviral drugs.   Anti-inflammatory drugs.   Pain medicines.  Bed rest is very important in preventing the pain associated with herpes zoster (postherpetic neuralgia). Application of heat in the form of a hot water bottle or electric heating pad or gentle pressure with the hand is recommended to help with the pain or discomfort. PREVENTION  A varicella zoster vaccine is available to help protect against the virus. The Food and Drug Administration approved the varicella zoster vaccine for individuals  89 years of age and older. HOME CARE INSTRUCTIONS   Cool compresses to the area of rash may be helpful.   Only take over-the-counter or prescription medicines for pain, discomfort, or fever as directed by your caregiver.   Avoid contact with:   Babies.   Pregnant women.   Children with eczema.   Elderly people with transplants.   People with chronic illnesses, such as leukemia and AIDS.   If the area involved is on your face, you may receive a referral for follow-up to a specialist. It is very important to keep all follow-up appointments. This will help avoid eye complications, chronic pain, or disability.  SEEK IMMEDIATE MEDICAL CARE IF:   You develop any pain (headache) in the area of the face or eye. This must be followed carefully by your caregiver or ophthalmologist. An infection in part of your eye (cornea) can be very serious. It could lead to blindness.   You do not have pain relief from prescribed medicines.   Your redness or swelling spreads.   The area involved becomes very swollen and painful.   You have a fever.   You notice any red or painful lines extending away from the affected area toward your heart (lymphangitis).   Your condition is worsening or has changed.  Document Released: 01/12/2005 Document Revised: 09/24/2010 Document Reviewed: 12/17/2008 [facility] Patient Information 2012 [place].

## 2011-01-23 ENCOUNTER — Encounter: Payer: Self-pay | Admitting: Internal Medicine

## 2011-01-23 ENCOUNTER — Ambulatory Visit (INDEPENDENT_AMBULATORY_CARE_PROVIDER_SITE_OTHER): Payer: 59 | Admitting: Internal Medicine

## 2011-01-23 VITALS — BP 138/70 | HR 72 | Temp 98.2°F | Ht 72.0 in | Wt 283.0 lb

## 2011-01-23 DIAGNOSIS — B029 Zoster without complications: Secondary | ICD-10-CM

## 2011-01-23 DIAGNOSIS — I10 Essential (primary) hypertension: Secondary | ICD-10-CM

## 2011-01-23 DIAGNOSIS — Z23 Encounter for immunization: Secondary | ICD-10-CM

## 2011-01-23 MED ORDER — ACYCLOVIR 800 MG PO TABS: 800.0000 mg | ORAL_TABLET | Freq: Every day | ORAL | Status: AC

## 2011-01-23 NOTE — Assessment & Plan Note (Signed)
BP Readings from Last 3 Encounters:  01/23/11 138/70  01/16/11 152/88  12/10/10 174/83   Better today No changes in meds for now

## 2011-01-23 NOTE — Assessment & Plan Note (Signed)
still with some active lesions Will extend the acyclovir another week
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
## 2011-02-28 ENCOUNTER — Other Ambulatory Visit: Payer: Self-pay | Admitting: Internal Medicine

## 2011-03-16 ENCOUNTER — Other Ambulatory Visit: Payer: Self-pay | Admitting: Internal Medicine

## 2011-03-16 ENCOUNTER — Telehealth: Payer: Self-pay | Admitting: Internal Medicine

## 2011-03-16 MED ORDER — INSULIN GLARGINE 100 UNIT/ML ~~LOC~~ SOLN: 100.0000 [IU] | Freq: Every day | SUBCUTANEOUS | Status: DC

## 2011-03-16 NOTE — Telephone Encounter (Signed)
rx sent to pharmacy by e-script  

## 2011-04-14 ENCOUNTER — Other Ambulatory Visit: Payer: Self-pay | Admitting: Internal Medicine

## 2011-05-13 ENCOUNTER — Other Ambulatory Visit: Payer: Self-pay | Admitting: Internal Medicine

## 2011-06-02 ENCOUNTER — Other Ambulatory Visit: Payer: Self-pay | Admitting: Internal Medicine

## 2011-06-03 ENCOUNTER — Other Ambulatory Visit: Payer: Self-pay | Admitting: Internal Medicine

## 2011-06-08 ENCOUNTER — Other Ambulatory Visit: Payer: Self-pay | Admitting: Internal Medicine

## 2011-06-19 ENCOUNTER — Encounter: Payer: 59 | Admitting: Internal Medicine

## 2011-06-26 ENCOUNTER — Ambulatory Visit (INDEPENDENT_AMBULATORY_CARE_PROVIDER_SITE_OTHER): Payer: 59 | Admitting: Internal Medicine

## 2011-06-26 ENCOUNTER — Encounter: Payer: Self-pay | Admitting: Internal Medicine

## 2011-06-26 VITALS — BP 144/80 | HR 69 | Temp 98.6°F | Ht 72.0 in | Wt 277.0 lb

## 2011-06-26 DIAGNOSIS — E785 Hyperlipidemia, unspecified: Secondary | ICD-10-CM

## 2011-06-26 DIAGNOSIS — Z0000 Encounter for general adult medical examination without abnormal findings: Secondary | ICD-10-CM

## 2011-06-26 DIAGNOSIS — IMO0001 Reserved for inherently not codable concepts without codable children: Secondary | ICD-10-CM

## 2011-06-26 DIAGNOSIS — I10 Essential (primary) hypertension: Secondary | ICD-10-CM

## 2011-06-26 DIAGNOSIS — G4733 Obstructive sleep apnea (adult) (pediatric): Secondary | ICD-10-CM

## 2011-06-26 LAB — LIPID PANEL
HDL: 32.9 mg/dL — ABNORMAL LOW (ref >39.00)
VLDL: 80 mg/dL — ABNORMAL HIGH (ref 0.0–40.0)

## 2011-06-26 LAB — CBC WITH DIFFERENTIAL/PLATELET
Basophils Absolute: 0 10*3/uL (ref 0.0–0.1)
Eosinophils Absolute: 0.2 10*3/uL (ref 0.0–0.7)
Lymphocytes Relative: 29.5 % (ref 12.0–46.0)
MCHC: 33.8 g/dL (ref 30.0–36.0)
MCV: 84.8 fl (ref 78.0–100.0)
Monocytes Absolute: 0.5 10*3/uL (ref 0.1–1.0)
Neutro Abs: 4.2 10*3/uL (ref 1.4–7.7)
Neutrophils Relative %: 59.8 % (ref 43.0–77.0)
RDW: 13.2 % (ref 11.5–14.6)

## 2011-06-26 LAB — HEPATIC FUNCTION PANEL
ALT: 32 U/L (ref 0–53)
Bilirubin, Direct: 0 mg/dL (ref 0.0–0.3)
Total Bilirubin: 1.6 mg/dL — ABNORMAL HIGH (ref 0.3–1.2)
Total Protein: 6.6 g/dL (ref 6.0–8.3)

## 2011-06-26 LAB — BASIC METABOLIC PANEL
CO2: 30 mEq/L (ref 19–32)
Calcium: 9.1 mg/dL (ref 8.4–10.5)
Chloride: 100 mEq/L (ref 96–112)
Creatinine, Ser: 1.2 mg/dL (ref 0.4–1.5)
Glucose, Bld: 220 mg/dL — ABNORMAL HIGH (ref 70–99)

## 2011-06-26 LAB — MICROALBUMIN / CREATININE URINE RATIO
Creatinine,U: 116.4 mg/dL
Microalb Creat Ratio: 143.5 mg/g — ABNORMAL HIGH (ref 0.0–30.0)
Microalb, Ur: 167.1 mg/dL — ABNORMAL HIGH (ref 0.0–1.9)

## 2011-06-26 LAB — HEMOGLOBIN A1C: Hgb A1c MFr Bld: 12 % — ABNORMAL HIGH (ref 4.6–6.5)

## 2011-06-26 NOTE — Assessment & Plan Note (Signed)
Doing well on CPAP

## 2011-06-26 NOTE — Assessment & Plan Note (Signed)
Doing better Working on fitness and has lost weight Cancer screening starts next year

## 2011-06-26 NOTE — Assessment & Plan Note (Signed)
BP Readings from Last 3 Encounters:  06/26/11 144/80  01/23/11 138/70  01/16/11 152/88   Not quite at goal No changes for now If persistent proteinuria, consider doxazosin

## 2011-06-26 NOTE — Assessment & Plan Note (Signed)
Lab Results  Component Value Date   LDLCALC 29 02/17/2008   Will recheck labs
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
## 2011-06-26 NOTE — Assessment & Plan Note (Signed)
hopefully still better control If A1c still over 9%, will increase and split his lantus dose

## 2011-07-02 ENCOUNTER — Other Ambulatory Visit: Payer: Self-pay | Admitting: *Deleted

## 2011-07-02 MED ORDER — DOXAZOSIN MESYLATE 4 MG PO TABS: 4.0000 mg | ORAL_TABLET | Freq: Every day | ORAL | Status: DC

## 2011-07-09 ENCOUNTER — Telehealth: Payer: Self-pay

## 2011-07-09 NOTE — Telephone Encounter (Signed)
Pt going to [place] 07/30/11; wants to know immunizations needed.[employer] travel health recommendations. Pt to go to [place] for assistance with immunizations.(we do not have needed immunizations).

## 2011-07-28 ENCOUNTER — Other Ambulatory Visit: Payer: Self-pay | Admitting: *Deleted

## 2011-07-28 MED ORDER — SITAGLIPTIN PHOSPHATE 100 MG PO TABS: 100.0000 mg | ORAL_TABLET | Freq: Every day | ORAL | Status: DC

## 2011-07-29 ENCOUNTER — Telehealth: Payer: Self-pay

## 2011-07-29 ENCOUNTER — Encounter: Payer: Self-pay | Admitting: *Deleted

## 2011-07-29 NOTE — Telephone Encounter (Signed)
Pt needs med list on letterhead to be picked up this afternoon; pt leaving for [place] in AM. Letterhead with notation copy of current med list attached. Pt said that would be OK and will pick up this afternoon. Letter and list  left at front desk.

## 2011-08-25 ENCOUNTER — Other Ambulatory Visit: Payer: Self-pay | Admitting: *Deleted

## 2011-08-25 MED ORDER — SITAGLIPTIN PHOSPHATE 100 MG PO TABS: 100.0000 mg | ORAL_TABLET | Freq: Every day | ORAL | Status: DC

## 2011-09-24 ENCOUNTER — Ambulatory Visit (INDEPENDENT_AMBULATORY_CARE_PROVIDER_SITE_OTHER): Payer: 59 | Admitting: Internal Medicine

## 2011-09-24 ENCOUNTER — Encounter: Payer: Self-pay | Admitting: Internal Medicine

## 2011-09-24 VITALS — BP 150/80 | HR 81 | Temp 98.5°F | Ht 72.0 in | Wt 281.0 lb

## 2011-09-24 DIAGNOSIS — IMO0001 Reserved for inherently not codable concepts without codable children: Secondary | ICD-10-CM

## 2011-09-24 DIAGNOSIS — M7660 Achilles tendinitis, unspecified leg: Secondary | ICD-10-CM

## 2011-09-24 DIAGNOSIS — L989 Disorder of the skin and subcutaneous tissue, unspecified: Secondary | ICD-10-CM
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
## 2011-09-24 NOTE — Assessment & Plan Note (Signed)
May have had slight folliculitis  Better now Will just observe

## 2011-09-24 NOTE — Assessment & Plan Note (Signed)
Has increased insulin May need to get referral to endocrine for more aggressive regimen He would like to wait till next appt to work harder on his lifestyle

## 2011-09-24 NOTE — Patient Instructions (Signed)
Please set up appointment with Dr Lorelei Pont to evaluate left Achilles pain

## 2011-09-24 NOTE — Assessment & Plan Note (Signed)
Will try ice and he has been using aleve Will set up with Dr Lorelei Pont for evaluation

## 2011-09-26 ENCOUNTER — Other Ambulatory Visit: Payer: Self-pay | Admitting: Internal Medicine

## 2011-09-30 ENCOUNTER — Ambulatory Visit: Payer: 59 | Admitting: Family Medicine

## 2011-10-01 ENCOUNTER — Ambulatory Visit (INDEPENDENT_AMBULATORY_CARE_PROVIDER_SITE_OTHER): Payer: 59 | Admitting: Family Medicine

## 2011-10-01 ENCOUNTER — Encounter: Payer: Self-pay | Admitting: Family Medicine

## 2011-10-01 VITALS — BP 124/74 | HR 92 | Temp 98.8°F | Resp 18 | Wt 284.5 lb

## 2011-10-01 DIAGNOSIS — M7660 Achilles tendinitis, unspecified leg: Secondary | ICD-10-CM

## 2011-10-01 MED ORDER — NITROGLYCERIN 0.2 MG/HR TD PT24: MEDICATED_PATCH | TRANSDERMAL | Status: DC
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
## 2011-10-01 NOTE — Patient Instructions (Signed)
Achilles Rehab  Calf raises on a step First lower and then raise on 1 foot If this is painful lower on 1 foot but do the heel raise on both feet  Begin with 3 sets of 10 repetitions  Increase by 5 repetitions every 3 days  Goal is 3 sets of 30 repetitions  Do with both knee straight and knee at 20 degrees of flexion  If pain persists at 3 sets of 30 - add backpack with 5 lbs Increase by 5 lbs per week to max of 30 lbs  F/u 5-6 weeks

## 2011-10-05 ENCOUNTER — Ambulatory Visit: Payer: 59 | Admitting: Family Medicine

## 2011-10-12 ENCOUNTER — Telehealth: Payer: Self-pay | Admitting: *Deleted

## 2011-10-12 ENCOUNTER — Telehealth: Payer: Self-pay | Admitting: Internal Medicine

## 2011-10-12 MED ORDER — INSULIN GLARGINE 100 UNIT/ML ~~LOC~~ SOLN: 65.0000 [IU] | Freq: Two times a day (BID) | SUBCUTANEOUS | Status: DC

## 2011-10-12 NOTE — Telephone Encounter (Signed)
Patient calling stating that he's running out of lantus early, per pt he takes 65 units in the morning and 65 units in the evening, pt states he have not increased and he didn't check to see if his vials where completely full when he left the pharmacy, pt will run out today and can't get refills until 10/22/2011, would like to know if we have any samples of lantus? If we don't he will be out of lantus for 9 days. Please advise

## 2011-10-12 NOTE — Telephone Encounter (Signed)
This was already handled.

## 2011-10-12 NOTE — Telephone Encounter (Signed)
Spoke with patient and advised results rx sent to pharmacy by e-script  

## 2011-10-12 NOTE — Telephone Encounter (Signed)
Caller: Shaurya/Patient; Phone: [phone]; Reason for Call: Patient calling to get a refill on his insulin, states not able to get it refilled until the 18th of Sept.  Patient is completely out, states doctor has him taking 65 units in the morning and night.  Please call him back.  Thanks

## 2011-10-12 NOTE — Telephone Encounter (Signed)
If we have samples, okay to give to hold him through  With 130 units daily, he needs 4000 units per month but that may not be quite enough if he doesn't use up every single bit out of the vials May want to refill at 70 bid so he can get 5000 units a month  Phone in enough for him to get to hold him over, even if he has to pay cash. Make sure pharmacist will fill

## 2011-11-13 ENCOUNTER — Other Ambulatory Visit: Payer: Self-pay

## 2011-11-13 MED ORDER — FLUTICASONE PROPIONATE 50 MCG/ACT NA SUSP: NASAL | Status: DC

## 2011-11-13 NOTE — Telephone Encounter (Signed)
Yes, he takes it bid Okay to fill double quantity for a year

## 2011-11-13 NOTE — Telephone Encounter (Signed)
Pt request larger quantity to 62 gto [facility]. Presently 16 g only last 1/2 a month.Please advise.

## 2011-11-13 NOTE — Telephone Encounter (Signed)
rx sent to pharmacy by e-script  

## 2011-11-18 ENCOUNTER — Ambulatory Visit: Payer: 59 | Admitting: Family Medicine

## 2011-11-18 DIAGNOSIS — Z0289 Encounter for other administrative examinations: Secondary | ICD-10-CM

## 2011-12-27 ENCOUNTER — Other Ambulatory Visit: Payer: Self-pay | Admitting: Internal Medicine

## 2011-12-30 ENCOUNTER — Other Ambulatory Visit: Payer: Self-pay | Admitting: Internal Medicine

## 2012-01-01 ENCOUNTER — Ambulatory Visit: Payer: 59 | Admitting: Internal Medicine

## 2012-01-22 ENCOUNTER — Ambulatory Visit (INDEPENDENT_AMBULATORY_CARE_PROVIDER_SITE_OTHER): Payer: 59 | Admitting: Internal Medicine

## 2012-01-22 ENCOUNTER — Encounter: Payer: Self-pay | Admitting: Internal Medicine

## 2012-01-22 VITALS — BP 158/80 | HR 74 | Temp 98.2°F | Ht 72.0 in | Wt 290.0 lb

## 2012-01-22 DIAGNOSIS — E785 Hyperlipidemia, unspecified: Secondary | ICD-10-CM

## 2012-01-22 DIAGNOSIS — I10 Essential (primary) hypertension: Secondary | ICD-10-CM

## 2012-01-22 DIAGNOSIS — E1129 Type 2 diabetes mellitus with other diabetic kidney complication: Secondary | ICD-10-CM

## 2012-01-22 DIAGNOSIS — E1165 Type 2 diabetes mellitus with hyperglycemia: Secondary | ICD-10-CM

## 2012-01-22 MED ORDER — "INSULIN SYRINGE-NEEDLE U-100 30G X 1/2"" 1 ML MISC": Status: DC

## 2012-01-22 MED ORDER — HYDRALAZINE HCL 25 MG PO TABS: 25.0000 mg | ORAL_TABLET | Freq: Three times a day (TID) | ORAL | Status: DC

## 2012-01-22 NOTE — Assessment & Plan Note (Signed)
BP Readings from Last 3 Encounters:  01/22/12 158/80  10/01/11 124/74  09/24/11 150/80   Recheck on right 162/90 Will add hydralazine 25 tid

## 2012-01-22 NOTE — Assessment & Plan Note (Signed)
Persistent elevated sugars probably (hasn't been checking) + microalbuminuria Not taking care of himself Will go ahead with referral to endocrine
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
## 2012-01-22 NOTE — Assessment & Plan Note (Signed)
Lab Results  Component Value Date   LDLCALC 29 02/17/2008   At least this is at goal  LDL in May 2013-- 30 Has high triglycerides from metabolic syndrome

## 2012-01-26 ENCOUNTER — Encounter: Payer: Self-pay | Admitting: *Deleted

## 2012-02-05 ENCOUNTER — Encounter: Payer: Self-pay | Admitting: Internal Medicine

## 2012-02-05 ENCOUNTER — Ambulatory Visit (INDEPENDENT_AMBULATORY_CARE_PROVIDER_SITE_OTHER): Payer: 59 | Admitting: Internal Medicine

## 2012-02-05 VITALS — BP 138/80 | HR 77 | Temp 98.3°F | Resp 16 | Ht 73.0 in | Wt 294.0 lb

## 2012-02-05 DIAGNOSIS — E119 Type 2 diabetes mellitus without complications: Secondary | ICD-10-CM

## 2012-02-05 DIAGNOSIS — E1165 Type 2 diabetes mellitus with hyperglycemia: Secondary | ICD-10-CM

## 2012-02-05 DIAGNOSIS — E1129 Type 2 diabetes mellitus with other diabetic kidney complication: Secondary | ICD-10-CM

## 2012-02-05 NOTE — Patient Instructions (Addendum)
Please return in a month with your sugar log. Download MyFitnessPal - and try to use it. Continue your current diabetic regimen.  Patient instructions for Diabetes mellitus type 2:  DIET AND EXERCISE Diet and exercise is an important part of diabetic treatment.  We recommended aerobic exercise in the form of brisk walking (working between 40-60% of maximal aerobic capacity, similar to brisk walking) for 150 minutes per week (such as 30 minutes five days per week) along with 3 times per week performing 'resistance' training (using various gauge rubber tubes with handles) 5-10 exercises involving the major muscle groups (upper body, lower body and core) performing 10-15 repetitions (or near fatigue) each exercise. Start at half the above goal but build slowly to reach the above goals. If limited by weight, joint pain, or disability, we recommend daily walking in a swimming pool with water up to waist to reduce pressure from joints while allow for adequate exercise.    BLOOD GLUCOSES Monitoring your blood glucoses is important for continued management of your diabetes. Please check your blood glucoses: fasting, before meals and at bedtime (you can rotate these measurements)  HYPOGLYCEMIA (low blood sugar) Hypoglycemia is usually a reaction to not eating, exercising, or taking too much insulin/ other diabetes drugs.  Symptoms include tremors, sweating, hunger, confusion, headache, etc. Treat IMMEDIATELY with 15 grams of Carbs:   4 glucose tablets    cup regular juice/soda   2 tablespoons raisins   4 teaspoons sugar   1 tablespoon honey Recheck blood glucose in 15 mins and repeat above if still symptomatic/blood glucose <100. Please contact our office at [phone] if you have questions about how to next handle your insulin.  RECOMMENDATIONS TO REDUCE YOUR RISK OF DIABETIC COMPLICATIONS: * Take your prescribed MEDICATION(S). * Follow a DIABETIC diet: Complex carbs, fiber rich foods, heart  healthy fish twice weekly, (monounsaturated and polyunsaturated) fats * AVOID saturated/trans fats, high fat foods, >2,300 mg salt per day. * EXERCISE at least 5 times a week for 30 minutes or preferably daily.  * DO NOT SMOKE OR DRINK more than 1 drink a day. * Check your FEET every day. Do not wear tightfitting shoes. Contact us if you develop an ulcer * See your EYE doctor once a year or more if needed * Get a FLU shot once a year * Get a PNEUMONIA vaccine every 5 years and once after age 31 years  GOALS:  * Your Hemoglobin A1c of 7%  * Your Systolic BP should be [id number] or lower  * Your Diastolic BP should be 80 or lower  * Your HDL (Good Cholesterol) should be 40 or higher  * Your LDL (Bad Cholesterol) should be 100 or lower  * Your Triglycerides should be 150 or lower  * Your Urine microalbumin (kidney function) of <30 * Your Body Mass Index should be 25 or lower  We will be glad to help you achieve these goals. Our telephone number is: [phone].
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
## 2012-02-17 ENCOUNTER — Other Ambulatory Visit: Payer: Self-pay | Admitting: Family Medicine

## 2012-02-17 MED ORDER — GLUCOSE BLOOD VI STRP: 1.0000 | ORAL_STRIP | Status: DC | PRN

## 2012-02-17 MED ORDER — GLUCOSE BLOOD VI STRP: ORAL_STRIP | Status: DC

## 2012-02-19 ENCOUNTER — Other Ambulatory Visit: Payer: Self-pay | Admitting: Internal Medicine

## 2012-02-22 ENCOUNTER — Encounter: Payer: Self-pay | Admitting: Internal Medicine

## 2012-02-26 ENCOUNTER — Encounter: Payer: 59 | Attending: Internal Medicine | Admitting: *Deleted

## 2012-02-26 ENCOUNTER — Encounter: Payer: Self-pay | Admitting: *Deleted

## 2012-02-26 VITALS — Ht 73.0 in | Wt 291.2 lb

## 2012-02-26 DIAGNOSIS — Z713 Dietary counseling and surveillance: Secondary | ICD-10-CM | POA: Insufficient documentation

## 2012-02-26 DIAGNOSIS — E1129 Type 2 diabetes mellitus with other diabetic kidney complication: Secondary | ICD-10-CM

## 2012-02-26 DIAGNOSIS — N058 Unspecified nephritic syndrome with other morphologic changes: Secondary | ICD-10-CM | POA: Insufficient documentation

## 2012-02-26 DIAGNOSIS — E1165 Type 2 diabetes mellitus with hyperglycemia: Secondary | ICD-10-CM | POA: Insufficient documentation

## 2012-02-26 DIAGNOSIS — E119 Type 2 diabetes mellitus without complications: Secondary | ICD-10-CM

## 2012-02-26 NOTE — Patient Instructions (Addendum)
Plan:  Aim for 3 Carb Choices per meal (45 grams) +/- 1 either way  Aim for 1-2 Carbs per snack if hungry  Consider reading food labels for Total Carbohydrate of foods Continue with your current activity plan as tolerated Consider checking BG at alternate times per day as directed by MD

## 2012-02-26 NOTE — Progress Notes (Signed)
  Medical Nutrition Therapy:  Appt start time: 0800 end time:  0930.  Assessment:  Primary concerns today: patient here for diabetes education. He is here with his wife who appears supportive. 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 Enjoys being outside in warmer weather. Wife shops and prepares the meals. SMBG once a day usually after work and if feels funny. Range is improving from 260 mg/dl down in low 100's. He does report a low BG of 65 mg/dl during the night this past week.  MEDICATIONS: see list   DIETARY INTAKE:  Usual eating pattern includes 3 meals and 1-3 snacks per day.  Everyday foods include good variety of all food groups with excessive restriction this past month.  Avoided foods include high fat and high sugar foods.    24-hr recall: these are since last appt with MD 1.5 months ago B ( AM): skips usually, trying to eat 1 egg on 1 piece of whole wheat bread, coffee with creamer or glass of 2% milk  Snk ( AM): [place] yogurt or fresh fruit  L ( PM): brings from home a salad with oil based dressing, diet Coke Snk ( PM): more fresh fruit or raw vegetables D ( PM): lean meat, sauteed vegetables, salad (avoiding starches now) Snk ( PM): 1 graham cracker OR yogurt OR fresh fruit Beverages: coffee, diet coke,   Usual physical activity: very physical with job, goes to gym 3 days a week where he walks 2 miles on treadmill & 25 minutes of weight lifting  Estimated energy needs: 1600 calories 180 g carbohydrates 120 g protein 44 g fat  Progress Towards Goal(s):  In progress.   Nutritional Diagnosis:  NB-1.1 Food and nutrition-related knowledge deficit As related to diabetes management.  As evidenced by A1c of 10.2% on 01/22/12. NI-1.5 Excessive energy intake As related to activity level.  As evidenced by BMI of 38.5.    Intervention:  Nutrition counseling and diabetes education initiated. Discussed basic physiology of  diabetes, SMBG and rationale of checking BG at alternate times of day, A1c, Carb Counting and reading food labels, and benefits of increased activity. Also provided handout and brief explanation of his current diabetes medications.  Plan:  Aim for 3 Carb Choices per meal (45 grams) +/- 1 either way  Aim for 1-2 Carbs per snack if hungry  Consider reading food labels for Total Carbohydrate of foods Continue with your current activity plan as tolerated Consider checking BG at alternate times per day as directed by MD   Handouts given during visit include: Living Well with Diabetes Carb Counting and Food Label handouts Meal Plan Card  Discusses [employer] for phone for more nutrition information  Monitoring/Evaluation:  Dietary intake, exercise, reading food labels, and body weight in 2 month(s).

## 2012-03-01 ENCOUNTER — Other Ambulatory Visit: Payer: Self-pay

## 2012-03-01 DIAGNOSIS — N529 Male erectile dysfunction, unspecified: Secondary | ICD-10-CM

## 2012-03-01 NOTE — Telephone Encounter (Signed)
Pt left v/m requesting refill on levitra to [facility] [address].Please advise.

## 2012-03-02 MED ORDER — VARDENAFIL HCL 20 MG PO TABS: 10.0000 mg | ORAL_TABLET | Freq: Every day | ORAL | Status: DC | PRN

## 2012-03-02 NOTE — Telephone Encounter (Signed)
rx sent to pharmacy by e-script  

## 2012-03-02 NOTE — Telephone Encounter (Signed)
Okay to refill #6 x 11

## 2012-03-09 NOTE — Telephone Encounter (Signed)
Pt wants to know status of Levitra refill; advised pt it was sent on 03/02/12 to pharmacy and Margarita Grizzle at [facility] said rx ready for pick up. Pt advised.

## 2012-03-11 ENCOUNTER — Ambulatory Visit: Payer: 59 | Admitting: Internal Medicine

## 2012-04-04 ENCOUNTER — Telehealth: Payer: Self-pay | Admitting: Internal Medicine

## 2012-04-04 MED ORDER — METFORMIN HCL 1000 MG PO TABS: 1000.0000 mg | ORAL_TABLET | Freq: Two times a day (BID) | ORAL | Status: DC

## 2012-04-04 NOTE — Telephone Encounter (Signed)
The patient missed his last appointment due to snow and now has rescheduled for 04/22/12, but he is out of metformin and needs a rx sent to the [facility] in [place].  The patient may be reached at [phone] with any questions.

## 2012-04-14 ENCOUNTER — Ambulatory Visit (INDEPENDENT_AMBULATORY_CARE_PROVIDER_SITE_OTHER): Payer: 59 | Admitting: Family Medicine

## 2012-04-14 ENCOUNTER — Ambulatory Visit (INDEPENDENT_AMBULATORY_CARE_PROVIDER_SITE_OTHER)
Admission: RE | Admit: 2012-04-14 | Discharge: 2012-04-14 | Disposition: A | Discharge status: Home / Self Care | Payer: 59 | Source: Ambulatory Visit | Attending: Family Medicine | Admitting: Family Medicine

## 2012-04-14 VITALS — BP 140/70 | HR 69 | Temp 98.1°F | Ht 73.0 in | Wt 288.5 lb

## 2012-04-14 DIAGNOSIS — M25579 Pain in unspecified ankle and joints of unspecified foot: Secondary | ICD-10-CM

## 2012-04-14 DIAGNOSIS — M25572 Pain in left ankle and joints of left foot: Secondary | ICD-10-CM

## 2012-04-14 DIAGNOSIS — R21 Rash and other nonspecific skin eruption: Secondary | ICD-10-CM

## 2012-04-14 MED ORDER — FLUOCINONIDE 0.05 % EX OINT: TOPICAL_OINTMENT | Freq: Two times a day (BID) | CUTANEOUS | Status: DC
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
## 2012-04-15 ENCOUNTER — Emergency Department (HOSPITAL_COMMUNITY)
Admission: EM | Admit: 2012-04-15 | Discharge: 2012-04-15 | Disposition: A | Discharge status: Home / Self Care | Payer: 59 | Attending: Emergency Medicine | Admitting: Emergency Medicine

## 2012-04-15 ENCOUNTER — Emergency Department (HOSPITAL_COMMUNITY): Payer: 59

## 2012-04-15 ENCOUNTER — Encounter: Payer: Self-pay | Admitting: Family Medicine

## 2012-04-15 ENCOUNTER — Encounter (HOSPITAL_COMMUNITY): Payer: Self-pay

## 2012-04-15 DIAGNOSIS — Z7982 Long term (current) use of aspirin: Secondary | ICD-10-CM | POA: Insufficient documentation

## 2012-04-15 DIAGNOSIS — E785 Hyperlipidemia, unspecified: Secondary | ICD-10-CM | POA: Insufficient documentation

## 2012-04-15 DIAGNOSIS — R109 Unspecified abdominal pain: Secondary | ICD-10-CM | POA: Insufficient documentation

## 2012-04-15 DIAGNOSIS — R000 Tachycardia, unspecified: Secondary | ICD-10-CM | POA: Insufficient documentation

## 2012-04-15 DIAGNOSIS — Z79899 Other long term (current) drug therapy: Secondary | ICD-10-CM | POA: Insufficient documentation

## 2012-04-15 DIAGNOSIS — Z794 Long term (current) use of insulin: Secondary | ICD-10-CM | POA: Insufficient documentation

## 2012-04-15 DIAGNOSIS — E119 Type 2 diabetes mellitus without complications: Secondary | ICD-10-CM | POA: Insufficient documentation

## 2012-04-15 DIAGNOSIS — IMO0002 Reserved for concepts with insufficient information to code with codable children: Secondary | ICD-10-CM | POA: Insufficient documentation

## 2012-04-15 DIAGNOSIS — R0602 Shortness of breath: Secondary | ICD-10-CM | POA: Insufficient documentation

## 2012-04-15 DIAGNOSIS — I10 Essential (primary) hypertension: Secondary | ICD-10-CM | POA: Insufficient documentation

## 2012-04-15 DIAGNOSIS — G4733 Obstructive sleep apnea (adult) (pediatric): Secondary | ICD-10-CM | POA: Insufficient documentation

## 2012-04-15 DIAGNOSIS — R071 Chest pain on breathing: Secondary | ICD-10-CM | POA: Insufficient documentation

## 2012-04-15 DIAGNOSIS — R630 Anorexia: Secondary | ICD-10-CM | POA: Insufficient documentation

## 2012-04-15 DIAGNOSIS — I872 Venous insufficiency (chronic) (peripheral): Secondary | ICD-10-CM | POA: Insufficient documentation

## 2012-04-15 DIAGNOSIS — R1110 Vomiting, unspecified: Secondary | ICD-10-CM

## 2012-04-15 LAB — CBC WITH DIFFERENTIAL/PLATELET
Basophils Absolute: 0 10*3/uL (ref 0.0–0.1)
Eosinophils Relative: 2 % (ref 0–5)
Lymphocytes Relative: 5 % — ABNORMAL LOW (ref 12–46)
Lymphs Abs: 0.7 10*3/uL (ref 0.7–4.0)
MCV: 78.5 fL (ref 78.0–100.0)
Neutro Abs: 11.3 10*3/uL — ABNORMAL HIGH (ref 1.7–7.7)
Platelets: 182 10*3/uL (ref 150–400)
RBC: 6.08 MIL/uL — ABNORMAL HIGH (ref 4.22–5.81)
RDW: 12.8 % (ref 11.5–15.5)
WBC: 12.8 10*3/uL — ABNORMAL HIGH (ref 4.0–10.5)

## 2012-04-15 LAB — POCT I-STAT TROPONIN I: Troponin i, poc: 0 ng/mL (ref 0.00–0.08)

## 2012-04-15 LAB — COMPREHENSIVE METABOLIC PANEL
ALT: 28 U/L (ref 0–53)
AST: 20 U/L (ref 0–37)
Alkaline Phosphatase: 107 U/L (ref 39–117)
CO2: 24 mEq/L (ref 19–32)
Chloride: 100 mEq/L (ref 96–112)
GFR calc Af Amer: >90 mL/min (ref >90)
GFR calc non Af Amer: 83 mL/min — ABNORMAL LOW (ref >90)
Glucose, Bld: 409 mg/dL — ABNORMAL HIGH (ref 70–99)
Potassium: 3.6 mEq/L (ref 3.5–5.1)
Sodium: 138 mEq/L (ref 135–145)
Total Bilirubin: 1.5 mg/dL — ABNORMAL HIGH (ref 0.3–1.2)

## 2012-04-15 LAB — URINE MICROSCOPIC-ADD ON

## 2012-04-15 LAB — URINALYSIS, ROUTINE W REFLEX MICROSCOPIC
Bilirubin Urine: NEGATIVE
Glucose, UA: >1000 mg/dL — AB
Ketones, ur: NEGATIVE mg/dL
Protein, ur: >300 mg/dL — AB
Urobilinogen, UA: 0.2 mg/dL (ref 0.0–1.0)

## 2012-04-15 MED ORDER — SODIUM CHLORIDE 0.9 % IV BOLUS (SEPSIS)
1000.0000 mL | Freq: Once | INTRAVENOUS | Status: AC
  Administered 2012-04-15: 1000 mL via INTRAVENOUS

## 2012-04-15 MED ORDER — ONDANSETRON 4 MG PO TBDP: ORAL_TABLET | ORAL | Status: DC

## 2012-04-15 MED ORDER — MORPHINE SULFATE 4 MG/ML IJ SOLN
4.0000 mg | Freq: Once | INTRAMUSCULAR | Status: AC
  Administered 2012-04-15: 4 mg via INTRAVENOUS
  Filled 2012-04-15: qty 1

## 2012-04-15 MED ORDER — HYDROMORPHONE HCL PF 1 MG/ML IJ SOLN
1.0000 mg | Freq: Once | INTRAMUSCULAR | Status: AC
  Administered 2012-04-15: 1 mg via INTRAVENOUS
  Filled 2012-04-15: qty 1

## 2012-04-15 MED ORDER — OXYCODONE-ACETAMINOPHEN 5-325 MG PO TABS: 2.0000 | ORAL_TABLET | ORAL | Status: DC | PRN

## 2012-04-15 MED ORDER — ONDANSETRON HCL 4 MG/2ML IJ SOLN
4.0000 mg | Freq: Once | INTRAMUSCULAR | Status: AC
  Administered 2012-04-15: 4 mg via INTRAVENOUS
  Filled 2012-04-15: qty 2

## 2012-04-15 MED ORDER — IOHEXOL 300 MG/ML  SOLN
100.0000 mL | Freq: Once | INTRAMUSCULAR | Status: AC | PRN
  Administered 2012-04-15: 100 mL via INTRAVENOUS

## 2012-04-15 MED ORDER — IOHEXOL 300 MG/ML  SOLN
50.0000 mL | Freq: Once | INTRAMUSCULAR | Status: AC | PRN
  Administered 2012-04-15: 50 mL via ORAL
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
## 2012-04-15 NOTE — ED Notes (Signed)
Pt presents with 3 day h/o epigastric pain.  Pt reports pain has been constant and radiates through to his back.  +nausea; pt reports abdominal distension.  +shortness of breath and bilateral lower leg edema

## 2012-04-15 NOTE — ED Notes (Signed)
Patient transported to CT 

## 2012-04-15 NOTE — ED Provider Notes (Signed)
Care assumed at sign out from Dr. Maryan Rued. Sign out was to reassess patient after CT ab/pel. CT unremarkable, patient felt better now, still slightly tender in RLQ but appendix normal. I think he may have gastroenteritis. Will d/c home on zofran prn and percocet for pain. I recommend GI f/u if his pain still persists after a week.   Wandra Arthurs, MD 04/15/12 [phone]

## 2012-04-22 ENCOUNTER — Ambulatory Visit: Payer: 59 | Admitting: Internal Medicine

## 2012-04-26 ENCOUNTER — Telehealth: Payer: Self-pay

## 2012-04-26 NOTE — Telephone Encounter (Signed)
Pt request call back with ankle xray report.Please advise.

## 2012-04-26 NOTE — Telephone Encounter (Signed)
Call  I apologize, i thought that I had reviewed in the office. There is no significant arthritis. Unremarkable xray.   Owens Loffler, MD 04/26/2012, 4:32 PM

## 2012-04-27 NOTE — Telephone Encounter (Signed)
Patient advised.

## 2012-04-29 ENCOUNTER — Ambulatory Visit: Payer: 59 | Admitting: Internal Medicine

## 2012-04-29 ENCOUNTER — Ambulatory Visit: Payer: 59 | Admitting: *Deleted

## 2012-05-06 ENCOUNTER — Ambulatory Visit: Payer: 59 | Admitting: Internal Medicine

## 2012-05-06 LAB — HM DIABETES EYE EXAM

## 2012-05-10 ENCOUNTER — Ambulatory Visit: Payer: 59 | Admitting: Family Medicine

## 2012-05-20 ENCOUNTER — Encounter: Payer: Self-pay | Admitting: Internal Medicine

## 2012-05-20 ENCOUNTER — Ambulatory Visit: Payer: 59 | Admitting: *Deleted

## 2012-05-20 ENCOUNTER — Ambulatory Visit (INDEPENDENT_AMBULATORY_CARE_PROVIDER_SITE_OTHER): Payer: 59 | Admitting: Internal Medicine

## 2012-05-20 VITALS — BP 140/80 | HR 67 | Temp 97.9°F | Wt 290.0 lb

## 2012-05-20 DIAGNOSIS — I10 Essential (primary) hypertension: Secondary | ICD-10-CM

## 2012-05-20 DIAGNOSIS — M25562 Pain in left knee: Secondary | ICD-10-CM | POA: Insufficient documentation

## 2012-05-20 DIAGNOSIS — J329 Chronic sinusitis, unspecified: Secondary | ICD-10-CM

## 2012-05-20 DIAGNOSIS — M25569 Pain in unspecified knee: Secondary | ICD-10-CM

## 2012-05-20 DIAGNOSIS — E1129 Type 2 diabetes mellitus with other diabetic kidney complication: Secondary | ICD-10-CM

## 2012-05-20 DIAGNOSIS — E1165 Type 2 diabetes mellitus with hyperglycemia: Secondary | ICD-10-CM

## 2012-05-20 NOTE — Assessment & Plan Note (Signed)
BP Readings from Last 3 Encounters:  05/20/12 140/80  04/15/12 151/71  04/14/12 140/70   Acceptable for now  Hopefully can improve with lifestyle changes

## 2012-05-20 NOTE — Assessment & Plan Note (Signed)
Limiting his activity Exam suggests meniscus injury Will set up with ortho

## 2012-05-20 NOTE — Assessment & Plan Note (Signed)
Dr Cruzita Lederer now managing Discussed that it is essential that he work towards weight loss

## 2012-05-20 NOTE — Patient Instructions (Signed)
Please try loratadine 10mg  1-2 tabs daily or cetirizine 10mg  daily for your allergy symptoms.
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
## 2012-05-20 NOTE — Assessment & Plan Note (Signed)
Worse now with pollen Discussed antihistamines

## 2012-05-23 ENCOUNTER — Telehealth: Payer: Self-pay | Admitting: *Deleted

## 2012-05-23 NOTE — Telephone Encounter (Signed)
Discussed with him Not sure MRI is clearly indicated but he didn't think the doc did much of an exam  Will set him up with Dr Lorelei Pont instead of another orthopedist because I know he will do a thorough exam  Samuel Reed, Please set him up an appt with Dr Lorelei Pont to evaluate his knee

## 2012-05-23 NOTE — Telephone Encounter (Signed)
Patient calling asking to be sent to another ortho dr., per pt they didn't see eye to eye, pt wanted an MRI but the dr. Lenon Ahmadi he didn't need one, he got a left ankle brace and knee brace and per pt he told them he couldn't wear the ankle brace because his ankle swells and it cut into his ankle and pt took it off. Pt would like to go to a dr that will give him a MRI. Pt states they also wanted to give him cortisone injections and he didn't want that because it takes 2 weeks to work and it will run his blood sugar up per pt. Please advise

## 2012-05-24 ENCOUNTER — Other Ambulatory Visit: Payer: Self-pay | Admitting: Internal Medicine

## 2012-05-24 NOTE — Telephone Encounter (Signed)
I spoke with patient and he scheduled appointment with Dr. Lorelei Pont on 05/26/12 at 9:00.

## 2012-05-26 ENCOUNTER — Ambulatory Visit (INDEPENDENT_AMBULATORY_CARE_PROVIDER_SITE_OTHER): Payer: 59 | Admitting: Family Medicine

## 2012-05-26 ENCOUNTER — Ambulatory Visit (INDEPENDENT_AMBULATORY_CARE_PROVIDER_SITE_OTHER)
Admission: RE | Admit: 2012-05-26 | Discharge: 2012-05-26 | Disposition: A | Discharge status: Home / Self Care | Payer: 59 | Source: Ambulatory Visit | Attending: Family Medicine | Admitting: Family Medicine

## 2012-05-26 ENCOUNTER — Encounter: Payer: Self-pay | Admitting: Family Medicine

## 2012-05-26 VITALS — BP 136/78 | HR 78 | Temp 98.3°F | Wt 293.0 lb

## 2012-05-26 DIAGNOSIS — M7660 Achilles tendinitis, unspecified leg: Secondary | ICD-10-CM

## 2012-05-26 DIAGNOSIS — M25569 Pain in unspecified knee: Secondary | ICD-10-CM

## 2012-05-26 DIAGNOSIS — M7662 Achilles tendinitis, left leg: Secondary | ICD-10-CM

## 2012-05-26 DIAGNOSIS — M25562 Pain in left knee: Secondary | ICD-10-CM

## 2012-05-26 MED ORDER — NITROGLYCERIN 0.2 MG/HR TD PT24: MEDICATED_PATCH | TRANSDERMAL | Status: DC
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
## 2012-05-26 NOTE — Patient Instructions (Addendum)
F/u 6 weeks  REFERRAL: GO THE THE FRONT ROOM AT THE ENTRANCE OF OUR CLINIC, NEAR CHECK IN. ASK FOR MARION. SHE WILL HELP YOU SET UP YOUR REFERRAL. DATE: TIME: 

## 2012-05-27 ENCOUNTER — Ambulatory Visit (INDEPENDENT_AMBULATORY_CARE_PROVIDER_SITE_OTHER): Payer: 59 | Admitting: Internal Medicine

## 2012-05-27 ENCOUNTER — Encounter: Payer: Self-pay | Admitting: Internal Medicine

## 2012-05-27 VITALS — BP 132/68 | HR 68 | Temp 98.6°F | Resp 10 | Wt 288.0 lb

## 2012-05-27 DIAGNOSIS — E1129 Type 2 diabetes mellitus with other diabetic kidney complication: Secondary | ICD-10-CM

## 2012-05-27 DIAGNOSIS — E119 Type 2 diabetes mellitus without complications: Secondary | ICD-10-CM

## 2012-05-27 LAB — HEMOGLOBIN A1C: Hgb A1c MFr Bld: 9 % — ABNORMAL HIGH (ref 4.6–6.5)

## 2012-05-27 NOTE — Progress Notes (Signed)
Subjective:     Patient ID: Samuel Reed, male   DOB: [birth year], 52 y.o.   MRN: [id number]  HPI [name] is a 52 y/o man returning for management of DM2, dx 2003, insulin-dependent, uncontrolled, with complications (nephropathy: GFR 72, MAU-143 in 05/2011; retinopathy OU per eye exam 08/25/2011, neuropathy - numbness and tingling in feet). He missed his last appt with me.  Last HbA1C: Lab Results  Component Value Date   HGBA1C 10.2* 01/22/2012   He is on a regimen of: - Lantus 65 units q12h - Metformin 1000 mg bid - Januvia 100 mg daily - Glipizide XL 10 mg daily  He was in the ED on 04/15/2012 for N/V/AP, consider to have had a gall bladder stone that caused pancreatitis. He was given oxycodone, but he does not like it, no pain now. Sugars were high, 409. He had a steroid injection in left knee.   Not checking sugars in the last month. He did very good after he saw me last, lost 8 lbs - to 281 lbs, changed his diet, started to exercise at the [facility]. He and his wife saw nutrition mid-February.   He has hypoglycemia awareness, but unclear at what sugar level. It does mean that he was able to feel CBGs at 90. No recent lows.   He had an eye exam 3 weeks ago, reportedly mild DR. He is on Lisinopril 40 mg daily for BP management and microalbuminuria (MAU). Last ACR value 143, previously 111, previously 856. He also has HyperTG-emia, last TG level 400 in 05/2011 (prev. 471, prev. 736). LDL levels are low, 29, on Crestor 20 mg daily. He also has OSA and is on CPAP.   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 Dr Rollene Fare saw him in the past for CP >> a cath was clean. He has OSA and wears CPAP every night. CP resolved after addition of CPAP.   Review of Systems Constitutional: no weight gain, no fatigue, no subjective hyperthermia/hypothermia; no increased thirst or urination Eyes: no blurry vision, no xerophthalmia ENT: no sore throat, no nodules palpated in throat, no  dysphagia/odynophagia, no hoarseness Cardiovascular: no CP/SOB/palpitations/ + leg swelling Respiratory: no cough/SOB,  has a stuffy nose Gastrointestinal: no N/V/D/C Musculoskeletal: + muscle/+ joint aches Skin: no rashes Neurological: no tremors/numbness/tingling/dizziness Psychiatric: no depression/anxiety Low libido  I reviewed pt's medications, allergies, PMH, social hx, family hx and no changes required, except as mentioned aboveabove.  Objective:   Physical Exam BP 132/68  Pulse 68  Temp(Src) 98.6 F (37 C) (Oral)  Resp 10  Wt 288 lb (130.636 kg)  BMI 38.01 kg/m2  SpO2 97% Wt Readings from Last 3 Encounters:  05/27/12 288 lb (130.636 kg)  05/26/12 293 lb (132.904 kg)  05/20/12 290 lb (131.543 kg)   Constitutional: obese, in NAD Eyes: PERRLA, EOMI, no exophthalmos ENT: moist mucous membranes, no thyromegaly, no cervical lymphadenopathy Cardiovascular: RRR, No MRG, LE swelling pitting 2+ bilat. Respiratory: CTA B Gastrointestinal: abdomen soft, NT, ND, BS+ Musculoskeletal: no deformities, strength intact in all 4 Skin: moist, warm, stasis dermatitis bilat. LE Neurological: no tremor with outstretched hands, DTR normal in all 4  Assessment:     1. 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 he was doing great approximately a month after with our last visit, per his report, with sugars that were almost at goal, mostly <120. However, since, he has fallen off the wagon, gained few pounds back, and could not exercise due to his left knee pain. He also got a steroid injection. He was in the emergency room with what appeared to be gastroenteritis, he initially told me that it was attributed to  pancreatitis, however, per my review of the chart, this was ruled out with a normal lipase and a normal CT of the abdomen/pelvis. We did discuss about the fact that Januvia is not recommended in patient with history of pancreatitis. - we discussed briefly about mealtime insulin, we might need to do this in the near future - I strongly encouraged him to get back to the dietary changes that he started at her last visit (he also had nutrition advice right after our visit and he feels that this helped a lot) - I encouraged him to try to exercise, by going to the gym, and trying at least water aerobics which are milder on his knee and would help his venous stasis which is quite severe, with bilateral lower extremity edema and stasis dermatitis - I gave him new sugar logs and advised him to check his sugars twice a day, in a.m. And before main meal is at bedtime, rotating check times - will continue same diabetic regimen for now - would check her hemoglobin A1c today - will see him back in 2 weeks with his log   Office Visit on 05/27/2012  Component Date Value Range Status  . HM Diabetic Eye Exam 05/06/2012 Reportedly mild DR - Dr. Suzanna Obey   Final  . Hemoglobin A1C 05/27/2012 9.0* 4.6 - 6.5 % Final   Glycemic Control Guidelines for People with Diabetes:Non Diabetic:  <6%Goal of Therapy: <7%Additional Action Suggested:  >8%    HbA1C improved.

## 2012-05-27 NOTE — Patient Instructions (Addendum)
Please return in 2 weeks for another appointment (06/10/2012 at 8 AM). Please do not forget your sugar log. You can continue to check twice a day, first check in a.m., and the rest before the other meals or before bedtime, rotating checks. I will send you the results of your hemoglobin A1c through my chart.

## 2012-06-10 ENCOUNTER — Ambulatory Visit (INDEPENDENT_AMBULATORY_CARE_PROVIDER_SITE_OTHER): Payer: 59 | Admitting: Internal Medicine

## 2012-06-10 ENCOUNTER — Encounter: Payer: Self-pay | Admitting: Internal Medicine

## 2012-06-10 VITALS — BP 138/82 | HR 68 | Temp 98.4°F | Resp 10 | Ht 72.0 in | Wt 281.0 lb

## 2012-06-10 DIAGNOSIS — E1165 Type 2 diabetes mellitus with hyperglycemia: Secondary | ICD-10-CM

## 2012-06-10 DIAGNOSIS — E1129 Type 2 diabetes mellitus with other diabetic kidney complication: Secondary | ICD-10-CM

## 2012-06-10 MED ORDER — INSULIN GLARGINE 100 UNIT/ML ~~LOC~~ SOLN: 60.0000 [IU] | Freq: Two times a day (BID) | SUBCUTANEOUS | Status: DC

## 2012-06-10 NOTE — Patient Instructions (Signed)
Please return in 2 months with your sugar log. KEEP UP THE GOOD WORK!

## 2012-06-10 NOTE — Progress Notes (Signed)
Subjective:     Patient ID: Samuel Reed, male   DOB: 04-21-1960, 52 y.o.   MRN: [id number]  HPI [name] is a 52 y/o man returning for management of DM2, dx 2003, insulin-dependent, uncontrolled, with complications (nephropathy: GFR 72, MAU-143 in 05/2011; retinopathy OU per eye exam 08/25/2011, neuropathy - numbness and tingling in feet). Last visit was 2 weeks ago.  Last HbA1C: Lab Results  Component Value Date   HGBA1C 9.0* 05/27/2012   He is on a regimen of: - Lantus 60 units q12h, pt decreased the does from 65 since last visit due to lows - Metformin 1000 mg bid - Januvia 100 mg daily - Glipizide XL 10 mg daily  He lost 12 lbs in last 2 weeks, by watching his diet, and cutting down on starches. He also entered a weight loss competition with one of his coworkers. He won the competition this month.  He brings a sugar log for the last week, and the sugars are: - A.m.: 166, 132, 174, 386, 204, 132 - Before dinner: 92, 252, 117, 213, 97 - Nighttime: 123, 164, 116 He has hypoglycemia awareness, but unclear at what sugar level. It does mean that he was able to feel CBGs at 90. No recent lows that I can see his log in the last week.   He had an eye exam 1 mo ago, reportedly mild DR. He is on Lisinopril 40 mg daily for BP management and microalbuminuria (MAU). Last ACR value 143, previously 111, previously 856. He also has HyperTG-emia, last TG level 400 in 05/2011 (prev. 471, prev. 736). LDL levels are low, 29, on Crestor 20 mg daily. He also has OSA and is on CPAP. 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 Dr Rollene Fare saw him in the past for CP >> a cath was clean. He has OSA and wears CPAP every night. CP resolved after addition of CPAP.   Review of Systems Constitutional: no weight gain, no fatigue, no subjective hyperthermia/hypothermia; no increased thirst or urination Eyes: no blurry vision, no xerophthalmia ENT: no sore throat, no nodules palpated in throat, no  dysphagia/odynophagia, no hoarseness Cardiovascular: no CP/SOB/palpitations/ + leg swelling Respiratory: no cough/SOB,  has a stuffy nose Gastrointestinal: no N/V/D/C Musculoskeletal: + muscle/+ joint aches Skin: no rashes Neurological: no tremors/numbness/tingling/dizziness Psychiatric: no depression/anxiety Low libido  I reviewed pt's medications, allergies, PMH, social hx, family hx and no changes required, except as mentioned above.  Objective:   Physical Exam BP 138/82  Pulse 68  Temp(Src) 98.4 F (36.9 C) (Oral)  Resp 10  Ht 6' (1.829 m)  Wt 281 lb (127.461 kg)  BMI 38.1 kg/m2  SpO2 96% Wt Readings from Last 3 Encounters:  06/10/12 281 lb (127.461 kg)  05/27/12 288 lb (130.636 kg)  05/26/12 293 lb (132.904 kg)   Constitutional: obese, in NAD Eyes: PERRLA, EOMI, no exophthalmos ENT: moist mucous membranes, no thyromegaly, no cervical lymphadenopathy Cardiovascular: RRR, No MRG, LE swelling pitting 2+ bilat. Respiratory: CTA B Gastrointestinal: abdomen soft, NT, ND, BS+ Musculoskeletal: no deformities, strength intact in all 4 Skin: moist, warm, stasis dermatitis bilat. LE Neurological: no tremor with outstretched hands, DTR normal in all 4 Foot exam performed at last visit Assessment:     1. 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 We reviewed patient's log together, and he has good sugars except for occasional spikes in the 200s and even one 386. He notices that these sugars appear when he overeats or when he eats starches (bread). He has cut down on these and doing a great job losing weight. He lost 12 pounds since last visit 2 weeks ago, which is excellent. I congratulated him  about this - He is trying to avoid the foods that he noticed that will cause the high sugars, so for now, I would not change his regimen, but we discussed about the need to cut down on his Lantus and stop the glipizide if his sugars start to drop. He did not start to walk yet due to the pain in his left knee and ankle, but he plans to start doing this soon. - sent Lantus refill to his pharmacy (vials) - HbA1C was checked last time and this improved, although it is still high, at 9%. - given more sugar logs - Return to clinic in 2 months with his sugar log

## 2012-06-11 ENCOUNTER — Other Ambulatory Visit: Payer: Self-pay | Admitting: Internal Medicine

## 2012-06-25 ENCOUNTER — Other Ambulatory Visit: Payer: Self-pay | Admitting: Internal Medicine

## 2012-07-12 ENCOUNTER — Other Ambulatory Visit: Payer: Self-pay | Admitting: Internal Medicine

## 2012-07-24 ENCOUNTER — Other Ambulatory Visit: Payer: Self-pay | Admitting: Internal Medicine

## 2012-08-20 ENCOUNTER — Other Ambulatory Visit: Payer: Self-pay | Admitting: Internal Medicine

## 2012-08-22 ENCOUNTER — Other Ambulatory Visit: Payer: Self-pay | Admitting: Internal Medicine

## 2012-09-04 ENCOUNTER — Other Ambulatory Visit: Payer: Self-pay | Admitting: Family Medicine

## 2012-09-05 NOTE — Telephone Encounter (Signed)
Are Samuel instructions for this correct? Please advise

## 2012-09-05 NOTE — Telephone Encounter (Signed)
Dr Lorelei Pont prescribed this and knows how he wanted to use it  I will refer the request to him

## 2012-09-06 ENCOUNTER — Encounter: Payer: Self-pay | Admitting: *Deleted

## 2012-09-06 NOTE — Telephone Encounter (Signed)
Yes, instructions are correct. This is the intro dose, you can use 1/4 - 1 patch, but with the #30, it gives them some leeway.  This can be continued indefinitely, and some of the chronic achilles patients with large nodules find it helps a lot with pain. (angiogenesis)  Dee, please refill #30, 2 refills

## 2012-09-07 NOTE — Telephone Encounter (Signed)
rx sent to pharmacy by e-script  

## 2012-09-07 NOTE — Telephone Encounter (Signed)
Okay to send 

## 2012-10-05 ENCOUNTER — Encounter: Payer: Self-pay | Admitting: Family Medicine

## 2012-10-05 ENCOUNTER — Ambulatory Visit (INDEPENDENT_AMBULATORY_CARE_PROVIDER_SITE_OTHER): Payer: 59 | Admitting: Family Medicine

## 2012-10-05 VITALS — BP 128/70 | HR 56 | Temp 98.0°F | Ht 73.0 in | Wt 290.2 lb

## 2012-10-05 DIAGNOSIS — IMO0002 Reserved for concepts with insufficient information to code with codable children: Secondary | ICD-10-CM

## 2012-10-05 DIAGNOSIS — M25569 Pain in unspecified knee: Secondary | ICD-10-CM

## 2012-10-05 DIAGNOSIS — S83207A Unspecified tear of unspecified meniscus, current injury, left knee, initial encounter: Secondary | ICD-10-CM

## 2012-10-05 DIAGNOSIS — M25562 Pain in left knee: Secondary | ICD-10-CM

## 2012-10-05 DIAGNOSIS — M2392 Unspecified internal derangement of left knee: Secondary | ICD-10-CM

## 2012-10-05 DIAGNOSIS — M2390 Unspecified internal derangement of unspecified knee: Secondary | ICD-10-CM

## 2012-10-05 NOTE — Patient Instructions (Addendum)
REFERRAL: GO THE THE FRONT ROOM AT THE ENTRANCE OF OUR CLINIC, NEAR CHECK IN. ASK FOR MARION. SHE WILL HELP YOU SET UP YOUR REFERRAL. DATE: TIME:  

## 2012-10-05 NOTE — Progress Notes (Addendum)
Therapist, music at [facility] [place] [address] Phone: [phone] Fax: [phone]  Date:  10/05/2012   Name:  Samuel Reed   DOB:  1960-07-21   MRN:  [id number] Gender: male Age: 52 y.o.  Primary Physician:  Viviana Simpler, MD  Evaluating MD: Owens Loffler, MD  Chief Complaint: Knee Pain   History of Present Illness:  Samuel Reed is a 52 y.o. very pleasant male patient who presents with the following:  Patient presents with approx 1 year h/o L sided knee pain after no occult injury. No audible pop was heard. The patient has not had an effusion. No symptomatic giving-way. No mechanical clicking. Joint has not locked up. Patient has been able to walk but is limping sometimes. The patient does have pain going up and down stairs or rising from a seated position.  It started hurting a lot more when he had to use a sledgehammer in the last few weeks.  We did 1 intraarticular injection a few months which helped only for a short time.  Pain location:  Anterior and lateral and medial Current physical activity: minimal, but squats and bends a lot with work Prior Knee Surgery: none Current pain meds: none, prn motrin Bracing: hinged brace intermittently  Left knee is worsening. Nothing done differently.  Ankle and achilles are great.   Past Medical History, Surgical History, Social History, Family History, Problem List, Medications, and Allergies have been reviewed and updated if relevant.  Current Outpatient Prescriptions on File Prior to Visit  Medication Sig Dispense Refill  . amLODipine (NORVASC) 10 MG tablet Take 10 mg by mouth daily.      [name] aspirin 325 MG EC tablet Take 325 mg by mouth daily.      . CRESTOR 20 MG tablet TAKE 1 BY MOUTH ONCE DAILY  30 tablet  11  . doxazosin (CARDURA) 4 MG tablet TAKE 1 TABLET (4 MG TOTAL) BY MOUTH AT BEDTIME.  90 tablet  2  . fluticasone (FLONASE) 50 MCG/ACT nasal spray Place 2 sprays into the nose 2 (two)  times daily.      [name] glipiZIDE (GLUCOTROL XL) 10 MG 24 hr tablet Take 10 mg by mouth daily.      [name] glucose blood (FREESTYLE LITE) test strip Use as instructed.  Test 3 times daily.  100 each  1  . hydrALAZINE (APRESOLINE) 25 MG tablet Take 1 tablet (25 mg total) by mouth 3 (three) times daily.  90 tablet  11  . Insulin Syringe-Needle U-100 (B-D INS SYR ULTRAFINE 1CC/30G) 30G X 1/2" 1 ML MISC Use to check blood glucose levels twice daily  100 each  11  . JANUVIA 100 MG tablet TAKE 1 TABLET (100 MG TOTAL) BY MOUTH DAILY.  90 tablet  3  . Lancets (FREESTYLE) lancets 1 each by Other route 3 (three) times daily. Use as instructed       . LANTUS 100 UNIT/ML injection INJECT 65 UNITS INTO THE SKIN 2 (TWO) TIMES DAILY. INCREASE AS DIRECTED  30 mL  11  . lisinopril-hydrochlorothiazide (PRINZIDE,ZESTORETIC) 20-12.5 MG per tablet Take 2 tablets by mouth daily.      . metFORMIN (GLUCOPHAGE) 1000 MG tablet Take 1 tablet (1,000 mg total) by mouth 2 (two) times daily with a meal.  60 tablet  5  . metoprolol (LOPRESSOR) 100 MG tablet Take 100 mg by mouth 2 (two) times daily.      . rosuvastatin (CRESTOR) 20 MG tablet Take 20 mg  by mouth daily.      . fluocinonide ointment (LIDEX) 0.05 % Apply topically 2 (two) times daily.  60 g  1  . nitroGLYCERIN (NITRODUR - DOSED IN MG/24 HR) 0.2 mg/hr patch APPLY 1/4 OF A PATCH TO THE AFFECTED AREA AND CHANGE EVERY 24 HOURS (RE: TENDINOPATHY)  30 patch  2  . ondansetron (ZOFRAN ODT) 4 MG disintegrating tablet 4mg  ODT q4 hours prn nausea/vomit  8 tablet  0  . oxyCODONE-acetaminophen (PERCOCET) 5-325 MG per tablet Take 2 tablets by mouth every 4 (four) hours as needed for pain.  10 tablet  0  . vardenafil (LEVITRA) 20 MG tablet Take 10-20 mg by mouth daily as needed for erectile dysfunction.       No current facility-administered medications on file prior to visit.    Review of Systems:  GEN: No fevers, chills. Nontoxic. Primarily MSK c/o today. MSK: Detailed in the  HPI GI: tolerating PO intake without difficulty Neuro: No numbness, parasthesias, or tingling associated. Otherwise the pertinent positives of the ROS are noted above.    Physical Examination: BP 128/70  Pulse 56  Temp(Src) 98 F (36.7 C) (Oral)  Ht 6\' 1"  (1.854 m)  Wt 290 lb 4 oz (131.657 kg)  BMI 38.3 kg/m2  GEN: WDWN, NAD, Non-toxic, Alert & Oriented x 3 HEENT: Atraumatic, Normocephalic.   Ears and Nose: No external deformity. EXTR: No clubbing/cyanosis/edema   PSYCH: Normally interactive. Conversant. Not depressed or anxious appearing.  Calm demeanor.   Knee:  L Gait: Normal heel toe pattern, notable antalgia ROM: loss 3 deg ext -105 Effusion: neg Echymosis or edema: none Patellar tendon NT Painful PLICA: neg Patellar grind: negative Medial and lateral patellar facet loading: PAIN medial and lateral joint lines: LATERAL PAIN and now medial pain Mcmurray's painful Flexion-pinch painful Varus and valgus stress: stable Lachman: neg Ant and Post drawer: neg Hip abduction, IR, ER: WNL Hip flexion str: 5/5 Hip abd: 5/5 Quad: 5/5 VMO atrophy:No Hamstring concentric and eccentric: 5/5    Dg Knee Ap/lat W/sunrise Left  05/26/2012  *RADIOLOGY REPORT*  Clinical Data: Pain  DG KNEE - 3 VIEWS  Comparison: None.  Findings: Frontal, lateral, and sunrise patellar images were obtained.  There is no fracture, dislocation, or effusion.  Joint spaces appear intact.  No erosive change.  There is a prominent spur arising from the anterior superior patella.  IMPRESSION: Spur arising from the anterior superior patella.  There is no appreciable joint space narrowing.  No fracture or effusion.   Original Report Authenticated By: Lowella Grip, M.D.       Assessment and Plan:  Left knee pain - Plan: MR Knee Left  Wo Contrast  Derangement, knee internal, left - Plan: MR Knee Left  Wo Contrast  Failure to improve with conservative care. Plain films with minimal OA in a highly active  patient. Obtain an MRI of the left knee to evaluate for occult internal derangement, most likely meniscal tear.    Addendum: MRI returned.  Consult orthopedic surgery for meniscal tear. Multiple other minor findings as well.  Mr Knee Left  Wo Contrast  10/09/2012   CLINICAL DATA:  Knee pain.  EXAM: MRI OF THE LEFT KNEE WITHOUT CONTRAST  TECHNIQUE: Multiplanar, multisequence MR imaging of the knee was performed. No intravenous contrast was administered.  COMPARISON:  Radiographs 05/26/2012  FINDINGS: MENISCI  Medial meniscus: Free edge and inferior articular surface tears involving the posterior horn along with intrasubstance mucinous degeneration.  Lateral meniscus: Mild degenerative  changes but no discrete meniscal tear.  LIGAMENTS  Cruciates:  Intact. Mild mucoid degeneration of the ACL.  Collaterals:  Intact. Mild MCL and pes anserine bursitis.  CARTILAGE  Patellofemoral:  Mild degenerative chondrosis.  Medial:  Moderate degenerative chondrosis.  Lateral:  Mild to moderate degenerative chondrosis.  Joint:  A small joint effusion is noted. Mild synovial thickening.  Popliteal Fossa:  Small Baker's cyst.  Extensor Mechanism: The patella retinacular structures are intact and the quadriceps and patellar tendons are intact.  Bones: No acute bony findings. Minimal tricompartmental spurring changes. No osteochondral abnormality.  IMPRESSION: Mild tricompartmental degenerative changes.  Free edge and inferior articular surface tears involving the posterior horn of the medial meniscus.  Intact ligamentous structures. Mild mucoid degeneration of the ACL.  Mild MCL and pes anserine bursitis.  Small joint effusion and small Baker's cyst.   Electronically Signed   By: Kalman Jewels M.D.   On: 10/09/2012 12:38    Orders Today:  Orders Placed This Encounter  Procedures  . MR Knee Left  Wo Contrast    290 LBS/NO NEEDS [facility]    Standing Status: Future     Number of Occurrences:      Standing  Expiration Date: 12/05/2013    Order Specific Question:  Does the patient have a pacemaker, internal devices, implants, aneury    Answer:  No    Order Specific Question:  Preferred imaging location?    Answer:  [facility]    Order Specific Question:  Reason for exam:    Answer:  L knee MRI, concern for medial meniscal tear    Updated Medication List: (Includes new medications, updates to list, dose adjustments) No orders of the defined types were placed in this encounter.    Medications Discontinued: Medications Discontinued During This Encounter  Medication Reason  . JANUVIA 123XX123 MG tablet Duplicate  . insulin glargine (LANTUS) 100 UNIT/ML injection Duplicate  . LANTUS 100 UNIT/ML injection Duplicate  . metoprolol (LOPRESSOR) 123XX123 MG tablet Duplicate  . metoprolol (LOPRESSOR) 123XX123 MG tablet Duplicate      Signed, Ami Thornsberry T. Azaya Goedde, MD 10/05/2012 1:44 PM

## 2012-10-07 ENCOUNTER — Ambulatory Visit
Admission: RE | Admit: 2012-10-07 | Discharge: 2012-10-07 | Disposition: A | Discharge status: Home / Self Care | Payer: 59 | Source: Ambulatory Visit | Attending: Family Medicine | Admitting: Family Medicine

## 2012-10-07 ENCOUNTER — Other Ambulatory Visit: Payer: Self-pay | Admitting: Family Medicine

## 2012-10-07 DIAGNOSIS — Z139 Encounter for screening, unspecified: Secondary | ICD-10-CM

## 2012-10-09 ENCOUNTER — Ambulatory Visit
Admission: RE | Admit: 2012-10-09 | Discharge: 2012-10-09 | Disposition: A | Discharge status: Home / Self Care | Payer: 59 | Source: Ambulatory Visit | Attending: Family Medicine | Admitting: Family Medicine

## 2012-10-09 DIAGNOSIS — M25562 Pain in left knee: Secondary | ICD-10-CM

## 2012-10-09 DIAGNOSIS — M2392 Unspecified internal derangement of left knee: Secondary | ICD-10-CM

## 2012-10-11 ENCOUNTER — Telehealth: Payer: Self-pay

## 2012-10-11 NOTE — Addendum Note (Signed)
Addended by: Owens Loffler on: 10/11/2012 10:40 AM   Modules accepted: Orders

## 2012-10-11 NOTE — Telephone Encounter (Signed)
Pt left v/m; pt said lt knee was causing pt more pain; pt request call back ASAP with MRI results.Please advise.

## 2012-10-12 NOTE — Telephone Encounter (Signed)
Sent via mychart - you may need to explain to him

## 2012-10-12 NOTE — Telephone Encounter (Signed)
Pt called and having problems getting into mychart. Patient notified as instructed by telephone from MRI results. Pt said pain in knee increasing and Rosaria Ferries is speaking with pt now about ortho referral.

## 2012-10-12 NOTE — Telephone Encounter (Signed)
Patient notified as instructed by telephone.  Awaiting call from [facility] with appointment to orthopedist.

## 2012-10-15 ENCOUNTER — Other Ambulatory Visit: Payer: Self-pay | Admitting: Internal Medicine

## 2012-10-18 ENCOUNTER — Ambulatory Visit (INDEPENDENT_AMBULATORY_CARE_PROVIDER_SITE_OTHER): Payer: 59 | Admitting: Internal Medicine

## 2012-10-18 ENCOUNTER — Encounter: Payer: Self-pay | Admitting: Internal Medicine

## 2012-10-18 VITALS — BP 130/86 | HR 65 | Temp 96.7°F | Wt 283.8 lb

## 2012-10-18 DIAGNOSIS — Z01810 Encounter for preprocedural cardiovascular examination: Secondary | ICD-10-CM

## 2012-10-18 NOTE — Assessment & Plan Note (Signed)
No symptoms No change in exercise tolerance---though knee has limited him some EKG shows early repolarization changes---no signs of ischemia  No contraindications to this low risk procedure No limitations
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
## 2012-11-03 ENCOUNTER — Other Ambulatory Visit: Payer: Self-pay | Admitting: Internal Medicine

## 2012-11-04 ENCOUNTER — Other Ambulatory Visit: Payer: Self-pay | Admitting: *Deleted

## 2012-11-04 MED ORDER — VARDENAFIL HCL 20 MG PO TABS: 10.0000 mg | ORAL_TABLET | Freq: Every day | ORAL | Status: DC | PRN

## 2012-11-04 NOTE — Telephone Encounter (Signed)
Sent in

## 2012-11-04 NOTE — Telephone Encounter (Signed)
Samuel Reed requests refill.  Samuel Reed states Dr. Lorelei Pont gave him some patches for his knee and told him not to mix the Levitra with that medication but he has not used the patches in 2 months so there is no contraindication now.  Please advise.

## 2012-11-25 ENCOUNTER — Ambulatory Visit (INDEPENDENT_AMBULATORY_CARE_PROVIDER_SITE_OTHER): Payer: 59 | Admitting: Internal Medicine

## 2012-11-25 ENCOUNTER — Encounter: Payer: Self-pay | Admitting: Internal Medicine

## 2012-11-25 VITALS — BP 142/88 | HR 67 | Temp 98.5°F | Ht 72.25 in | Wt 285.0 lb

## 2012-11-25 DIAGNOSIS — I10 Essential (primary) hypertension: Secondary | ICD-10-CM

## 2012-11-25 DIAGNOSIS — Z1211 Encounter for screening for malignant neoplasm of colon: Secondary | ICD-10-CM

## 2012-11-25 DIAGNOSIS — Z125 Encounter for screening for malignant neoplasm of prostate: Secondary | ICD-10-CM

## 2012-11-25 DIAGNOSIS — E1121 Type 2 diabetes mellitus with diabetic nephropathy: Secondary | ICD-10-CM

## 2012-11-25 DIAGNOSIS — Z0000 Encounter for general adult medical examination without abnormal findings: Secondary | ICD-10-CM

## 2012-11-25 DIAGNOSIS — E785 Hyperlipidemia, unspecified: Secondary | ICD-10-CM

## 2012-11-25 DIAGNOSIS — I872 Venous insufficiency (chronic) (peripheral): Secondary | ICD-10-CM

## 2012-11-25 DIAGNOSIS — Z23 Encounter for immunization: Secondary | ICD-10-CM

## 2012-11-25 DIAGNOSIS — E1129 Type 2 diabetes mellitus with other diabetic kidney complication: Secondary | ICD-10-CM

## 2012-11-25 DIAGNOSIS — N058 Unspecified nephritic syndrome with other morphologic changes: Secondary | ICD-10-CM

## 2012-11-25 LAB — BASIC METABOLIC PANEL
CO2: 31 mEq/L (ref 19–32)
Chloride: 101 mEq/L (ref 96–112)
Creatinine, Ser: 1.1 mg/dL (ref 0.4–1.5)
Potassium: 4 mEq/L (ref 3.5–5.1)
Sodium: 140 mEq/L (ref 135–145)

## 2012-11-25 LAB — CBC WITH DIFFERENTIAL/PLATELET
Basophils Relative: 0.6 % (ref 0.0–3.0)
Eosinophils Absolute: 0.5 10*3/uL (ref 0.0–0.7)
Eosinophils Relative: 6.5 % — ABNORMAL HIGH (ref 0.0–5.0)
HCT: 43.2 % (ref 39.0–52.0)
Hemoglobin: 14.7 g/dL (ref 13.0–17.0)
Lymphs Abs: 2 10*3/uL (ref 0.7–4.0)
MCHC: 34.1 g/dL (ref 30.0–36.0)
MCV: 83.5 fl (ref 78.0–100.0)
Monocytes Absolute: 0.4 10*3/uL (ref 0.1–1.0)
Neutro Abs: 4.3 10*3/uL (ref 1.4–7.7)
RBC: 5.17 Mil/uL (ref 4.22–5.81)

## 2012-11-25 LAB — HEPATIC FUNCTION PANEL
ALT: 29 U/L (ref 0–53)
AST: 26 U/L (ref 0–37)
Total Protein: 6.6 g/dL (ref 6.0–8.3)

## 2012-11-25 LAB — LIPID PANEL
Cholesterol: 157 mg/dL (ref 0–200)
Total CHOL/HDL Ratio: 4

## 2012-11-25 LAB — PSA: PSA: 0.65 ng/mL (ref 0.10–4.00)

## 2012-11-25 LAB — LDL CHOLESTEROL, DIRECT: Direct LDL: 64.3 mg/dL

## 2012-11-25 NOTE — Assessment & Plan Note (Signed)
Discussed fitness Will check PSA after discussion colonoscopy

## 2012-11-25 NOTE — Assessment & Plan Note (Signed)
Microalbuminuria On ACEI

## 2012-11-25 NOTE — Assessment & Plan Note (Signed)
BP Readings from Last 3 Encounters:  11/25/12 142/88  10/18/12 130/86  10/05/12 128/70   Up slightly Working on weight loss

## 2012-11-25 NOTE — Assessment & Plan Note (Signed)
Hopefully better control Will check labs
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
## 2012-11-25 NOTE — Addendum Note (Signed)
Addended by: Tammi Sou on: 11/25/2012 12:29 PM   Modules accepted: Orders

## 2012-11-25 NOTE — Assessment & Plan Note (Signed)
On statin Due for labs

## 2012-11-25 NOTE — Assessment & Plan Note (Signed)
Mild edema since the knee surgery

## 2012-12-06 ENCOUNTER — Other Ambulatory Visit: Payer: Self-pay | Admitting: Internal Medicine

## 2013-01-31 ENCOUNTER — Other Ambulatory Visit: Payer: Self-pay | Admitting: Internal Medicine

## 2013-02-03 ENCOUNTER — Telehealth: Payer: Self-pay

## 2013-02-03 ENCOUNTER — Encounter: Payer: Self-pay | Admitting: Internal Medicine

## 2013-02-03 ENCOUNTER — Ambulatory Visit (AMBULATORY_SURGERY_CENTER): Payer: 59

## 2013-02-03 VITALS — Ht 72.0 in | Wt 295.5 lb

## 2013-02-03 DIAGNOSIS — Z1211 Encounter for screening for malignant neoplasm of colon: Secondary | ICD-10-CM

## 2013-02-03 MED ORDER — MOVIPREP 100 G PO SOLR: ORAL | Status: DC

## 2013-02-03 NOTE — Telephone Encounter (Signed)
Pt left v/m that currently at [facility] endoscopy for pre check for colonoscopy and pt request current med list faxed to [phone] Dr Hilarie Fredrickson is physician. Left v/m for pt done.

## 2013-02-09 ENCOUNTER — Other Ambulatory Visit: Payer: Self-pay | Admitting: Internal Medicine

## 2013-02-17 ENCOUNTER — Ambulatory Visit (AMBULATORY_SURGERY_CENTER): Payer: 59 | Admitting: Internal Medicine

## 2013-02-17 ENCOUNTER — Encounter: Payer: Self-pay | Admitting: Internal Medicine

## 2013-02-17 VITALS — BP 152/89 | HR 65 | Temp 96.7°F | Resp 19 | Ht 72.0 in | Wt 295.0 lb

## 2013-02-17 DIAGNOSIS — Z1211 Encounter for screening for malignant neoplasm of colon: Secondary | ICD-10-CM

## 2013-02-17 DIAGNOSIS — D126 Benign neoplasm of colon, unspecified: Secondary | ICD-10-CM

## 2013-02-17 LAB — GLUCOSE, CAPILLARY
GLUCOSE-CAPILLARY: 132 mg/dL — AB (ref 70–99)
Glucose-Capillary: 147 mg/dL — ABNORMAL HIGH (ref 70–99)

## 2013-02-17 MED ORDER — SODIUM CHLORIDE 0.9 % IV SOLN: 500.0000 mL | INTRAVENOUS | Status: DC

## 2013-02-17 NOTE — Progress Notes (Signed)
Called to room to assist during endoscopic procedure.  Patient ID and intended procedure confirmed with present staff. Received instructions for my participation in the procedure from the performing physician.  

## 2013-02-17 NOTE — Patient Instructions (Signed)
YOU HAD AN ENDOSCOPIC PROCEDURE TODAY AT THE [facility]: Refer to the procedure report that was given to you for any specific questions about what was found during the examination.  If the procedure report does not answer your questions, please call your gastroenterologist to clarify.  If you requested that your care partner not be given the details of your procedure findings, then the procedure report has been included in a sealed envelope for you to review at your convenience later.  YOU SHOULD EXPECT: Some feelings of bloating in the abdomen. Passage of more gas than usual.  Walking can help get rid of the air that was put into your GI tract during the procedure and reduce the bloating. If you had a lower endoscopy (such as a colonoscopy or flexible sigmoidoscopy) you may notice spotting of blood in your stool or on the toilet paper. If you underwent a bowel prep for your procedure, then you may not have a normal bowel movement for a few days.  DIET: Your first meal following the procedure should be a light meal and then it is ok to progress to your normal diet.  A half-sandwich or bowl of soup is an example of a good first meal.  Heavy or fried foods are harder to digest and may make you feel nauseous or bloated.  Likewise meals heavy in dairy and vegetables can cause extra gas to form and this can also increase the bloating.  Drink plenty of fluids but you should avoid alcoholic beverages for 24 hours.  ACTIVITY: Your care partner should take you home directly after the procedure.  You should plan to take it easy, moving slowly for the rest of the day.  You can resume normal activity the day after the procedure however you should NOT DRIVE or use heavy machinery for 24 hours (because of the sedation medicines used during the test).    SYMPTOMS TO REPORT IMMEDIATELY: A gastroenterologist can be reached at any hour.  During normal business hours, 8:30 AM to 5:00 PM Monday through Friday,  call [phone].  After hours and on weekends, please call the GI answering service at [phone] who will take a message and have the physician on call contact you.   Following lower endoscopy (colonoscopy or flexible sigmoidoscopy):  Excessive amounts of blood in the stool  Significant tenderness or worsening of abdominal pains  Swelling of the abdomen that is new, acute  Fever of 100F or higher  FOLLOW UP: If any biopsies were taken you will be contacted by phone or by letter within the next 1-3 weeks.  Call your gastroenterologist if you have not heard about the biopsies in 3 weeks.  Our staff will call the home number listed on your records the next business day following your procedure to check on you and address any questions or concerns that you may have at that time regarding the information given to you following your procedure. This is a courtesy call and so if there is no answer at the home number and we have not heard from you through the emergency physician on call, we will assume that you have returned to your regular daily activities without incident.  SIGNATURES/CONFIDENTIALITY: You and/or your care partner have signed paperwork which will be entered into your electronic medical record.  These signatures attest to the fact that that the information above on your After Visit Summary has been reviewed and is understood.  Full responsibility of the confidentiality of this   discharge information lies with you and/or your care-partner.  Recommendations Await pathology results Hold aspirin, aspirin products, and anti-inflammatory medication for 1 week Next colonoscopy determined by pathology results

## 2013-02-17 NOTE — Progress Notes (Signed)
Procedure ends, to recovery, report given and VSS. 

## 2013-02-17 NOTE — Op Note (Signed)
[place]  [address] [place], [address]   COLONOSCOPY PROCEDURE REPORT  [name], Samuel Reed  MR#: [id number] BIRTHDATE: 08-22-1960 , 52  yrs. old GENDER: Male ENDOSCOPIST: Jerene Bears, MD REFERRED [id number] Silvio Pate, M.D. PROCEDURE DATE:  02/17/2013 PROCEDURE:   Colonoscopy with snare polypectomy and Colonoscopy with cold biopsy polypectomy First Screening Colonoscopy - Avg.  risk and is 50 yrs.  old or older Yes.  Prior Negative Screening - Now for repeat screening. N/A  History of Adenoma - Now for follow-up colonoscopy & has been > or = to 3 yrs.  N/A  Polyps Removed Today? Yes. ASA CLASS:   Class III INDICATIONS:average risk screening and first colonoscopy. MEDICATIONS: MAC sedation, administered by CRNA and Propofol (Diprivan) 340 mg IV  DESCRIPTION OF PROCEDURE:   After the risks benefits and alternatives of the procedure were thoroughly explained, informed consent was obtained.  A digital rectal exam revealed no rectal mass.   The LB CF-H180AL Loaner [id number]  endoscope was introduced through the anus and advanced to the cecum, which was identified by both the appendix and ileocecal valve. No adverse events experienced.   The quality of the prep was good, using MoviPrep The instrument was then slowly withdrawn as the colon was fully examined.   COLON FINDINGS: Two sessile polyps measuring 3 mm in size were found at the cecum and in the transverse colon.  Polypectomy was performed with cold forceps.  All resections were complete and all polyp tissue was completely retrieved.   Two sessile polyps measuring 4-6 mm in size were found in the descending colon and sigmoid colon.  Polypectomy was performed using cold snare.  All resections were complete and all polyp tissue was completely retrieved.  Retroflexed views revealed no abnormalities. The time to cecum=3 minutes 13 seconds.  Withdrawal time=15 minutes 38 seconds.  The scope was withdrawn  and the procedure completed. COMPLICATIONS: There were no complications.  ENDOSCOPIC IMPRESSION: 1.   Two sessile polyps measuring 3 mm in size were found at the cecum and in the transverse colon; Polypectomy was performed with cold forceps 2.   Two sessile polyps measuring 4-6 mm in size were found in the descending colon and sigmoid colon; Polypectomy was performed using cold snare  RECOMMENDATIONS: 1.  Await pathology results 2.  Hold aspirin, aspirin products, and anti-inflammatory medication for 1 week. 3.  If the polyps removed today are proven to be adenomatous (pre-cancerous) polyps, you will need a colonoscopy in 3 years. Otherwise you should continue to follow colorectal cancer screening guidelines for "routine risk" patients with a colonoscopy in 10 years.  You will receive a letter within 1-2 weeks with the results of your biopsy as well as final recommendations.  Please call my office if you have not received a letter after 3 weeks.   eSigned:  Jerene Bears, MD 02/17/2013 9:09 AM   cc: The Patient and [name], MD   PATIENT NAME:  Samuel Reed, Samuel Reed MR#: [id number]

## 2013-02-20 ENCOUNTER — Telehealth: Payer: Self-pay | Admitting: *Deleted

## 2013-02-20 NOTE — Telephone Encounter (Signed)
  Follow up Call-  Call back number 02/17/2013  Post procedure Call Back phone  # [phone]  Permission to leave phone message Yes     Patient questions:  Do you have a fever, pain , or abdominal swelling? no Pain Score  0 *  Have you tolerated food without any problems? yes  Have you been able to return to your normal activities? yes  Do you have any questions about your discharge instructions: Diet   no Medications  no Follow up visit  no  Do you have questions or concerns about your Care? no  Actions: * If pain score is 4 or above: No action needed, pain <4.

## 2013-02-22 ENCOUNTER — Encounter: Payer: Self-pay | Admitting: Internal Medicine

## 2013-03-09 ENCOUNTER — Other Ambulatory Visit: Payer: Self-pay | Admitting: Internal Medicine

## 2013-03-09 ENCOUNTER — Other Ambulatory Visit: Payer: Self-pay | Admitting: Family Medicine

## 2013-03-09 NOTE — Telephone Encounter (Signed)
I will forward to Dr Lorelei Pont to see if he wants to continue this

## 2013-03-09 NOTE — Telephone Encounter (Signed)
i believe that he was on this for his achilles tendon, but this was some time ago. i think he should be able to stop it.

## 2013-03-09 NOTE — Telephone Encounter (Signed)
Last office visit 11/18/2012.  Ok to refill?

## 2013-03-09 NOTE — Telephone Encounter (Signed)
Please decline this

## 2013-05-09 ENCOUNTER — Other Ambulatory Visit: Payer: Self-pay | Admitting: Internal Medicine

## 2013-05-10 ENCOUNTER — Ambulatory Visit (INDEPENDENT_AMBULATORY_CARE_PROVIDER_SITE_OTHER): Payer: 59 | Admitting: Family Medicine

## 2013-05-10 ENCOUNTER — Encounter: Payer: Self-pay | Admitting: Family Medicine

## 2013-05-10 VITALS — BP 160/76 | HR 67 | Temp 97.9°F | Ht 72.25 in

## 2013-05-10 DIAGNOSIS — M25561 Pain in right knee: Secondary | ICD-10-CM

## 2013-05-10 DIAGNOSIS — M2391 Unspecified internal derangement of right knee: Secondary | ICD-10-CM

## 2013-05-10 DIAGNOSIS — M2390 Unspecified internal derangement of unspecified knee: Secondary | ICD-10-CM

## 2013-05-10 DIAGNOSIS — M25569 Pain in unspecified knee: Secondary | ICD-10-CM

## 2013-05-10 NOTE — Progress Notes (Signed)
Date:  05/10/2013   Name:  Weston Whittaker   DOB:  Mar 28, 1960   MRN:  [id number]  PCP:  Viviana Simpler, MD  Evaluating MD: Owens Loffler, MD  This 53 y.o. male patient presents with 1 month h/o R sided knee pain after no occult injury. No audible pop was heard. The patient has had minimal effusion. No symptomatic giving-way. No mechanical clicking. Joint has not locked up. Patient has been able to walk but is limping. The patient does have pain going up and down stairs or rising from a seated position.   Pain location: medial Current physical activity: mechanic Prior Knee Surgery: L arthroscopy, partial menisectomy and chondroplasty, 2014 by Dr. Mardelle Matte Current pain meds: none Bracing: none  The PMH, PSH, Social History, Family History, Medications, and allergies have been reviewed in [facility], and have been updated if relevant.  ROS: no acute illness or fever MSK: above GI: tol po intake without nausea or vomitting. Neuro: no numbness, tingling, or radiculopathy O/w per hpi  PHYSICAL EXAM  Blood pressure 160/76, pulse 67, temperature 97.9 F (36.6 C), temperature source Oral, height 6' 0.25" (1.835 m), SpO2 97.00%.  GEN: Well-developed,well-nourished,in no acute distress; alert,appropriate and cooperative throughout examination HEENT: Normocephalic and atraumatic without obvious abnormalities. Ears, externally no deformities PULM: Breathing comfortably in no respiratory distress EXT: No clubbing, cyanosis, or edema PSYCH: Normally interactive. Cooperative during the interview. Pleasant. Friendly and conversant. Not anxious or depressed appearing. Normal, full affect.  Gait: antalgic ROM:  0-110 Effusion: mild Echymosis or edema: none Patellar tendon NT Painful PLICA: neg Patellar grind: negative Medial and lateral patellar facet loading: negative medial and lateral joint lines: medial joint line tenderness Mcmurray's pain Flexion-pinch pos, mild Varus and valgus stress:  stable Lachman: neg Ant and Post drawer: neg Hip abduction, IR, ER: WNL Hip flexion str: 5/5 Hip abd: 5/5 Quad: 5/5 VMO atrophy:No Hamstring concentric and eccentric: 5/5    Assessment and Plan: Right knee pain  Internal derangement of right knee  Most likely medial meniscal tear. Cannot exclude pure arthritic flare. Treat conservatively with ice, anti-inflammatories and inject with corticosteroid.  Knee Injection, R Patient verbally consented to procedure. Risks (including potential rare risk of infection), benefits, and alternatives explained. Sterilely prepped with Chloraprep. Ethyl cholride used for anesthesia. 8 cc Lidocaine 1% mixed with 1 1/2 cc of Depo-Medrol 40 mg injected using the anteromedial approach without difficulty. No complications with procedure and tolerated well. Patient had decreased pain post-injection.   Signed,  Maud Deed. Maryfer Tauzin, MD, [place] at [facility] [place] [address] Phone: [phone] Fax: [phone]   Patient's Medications  New Prescriptions   No medications on file  Previous Medications   AMLODIPINE (NORVASC) 10 MG TABLET    TAKE 1 TABLET (10 MG TOTAL) BY MOUTH DAILY.   ASPIRIN 325 MG EC TABLET    Take 325 mg by mouth daily.   CRESTOR 20 MG TABLET    TAKE 1 BY MOUTH ONCE DAILY   DOXAZOSIN (CARDURA) 4 MG TABLET    TAKE 1 TABLET (4 MG TOTAL) BY MOUTH AT BEDTIME.   FLUTICASONE (FLONASE) 50 MCG/ACT NASAL SPRAY    USE 2 SPRAYS IN EACH NOSTRIL 2 TIMES A DAY   GLIPIZIDE (GLUCOTROL XL) 10 MG 24 HR TABLET    Take 10 mg by mouth daily.   GLUCOSE BLOOD (FREESTYLE LITE) TEST STRIP    Use as instructed.  Test 3 times daily.  HYDRALAZINE (APRESOLINE) 25 MG TABLET    TAKE 1 TABLET (25 MG TOTAL) BY MOUTH 3 (THREE) TIMES DAILY.   INSULIN SYRINGE-NEEDLE U-100 (B-D INS SYR ULTRAFINE 1CC/30G) 30G X 1/2" 1 ML MISC    Use to check blood glucose levels twice daily   JANUVIA 100 MG TABLET    TAKE 1  TABLET (100 MG TOTAL) BY MOUTH DAILY.   LANCETS (FREESTYLE) LANCETS    1 each by Other route 3 (three) times daily. Use as instructed    LANTUS 100 UNIT/ML INJECTION    INJECT 65 UNITS INTO THE SKIN 2 (TWO) TIMES DAILY. INCREASE AS DIRECTED   LISINOPRIL-HYDROCHLOROTHIAZIDE (PRINZIDE,ZESTORETIC) 20-12.5 MG PER TABLET    TAKE 2 TABLETS BY MOUTH EVERY DAY   METFORMIN (GLUCOPHAGE) 1000 MG TABLET    TAKE 1 TABLET (1,000 MG TOTAL) BY MOUTH 2 (TWO) TIMES DAILY WITH A MEAL.   METOPROLOL (LOPRESSOR) 100 MG TABLET    Take 100 mg by mouth 2 (two) times daily.  Modified Medications   No medications on file  Discontinued Medications   VARDENAFIL (LEVITRA) 20 MG TABLET    Take 0.5-1 tablets (10-20 mg total) by mouth daily as needed for erectile dysfunction.

## 2013-05-10 NOTE — Progress Notes (Signed)
Pre visit review using our clinic review tool, if applicable. No additional management support is needed unless otherwise documented below in the visit note. 

## 2013-05-19 ENCOUNTER — Ambulatory Visit: Payer: 59 | Admitting: Internal Medicine

## 2013-05-23 ENCOUNTER — Ambulatory Visit: Payer: 59 | Admitting: Internal Medicine

## 2013-05-30 ENCOUNTER — Other Ambulatory Visit: Payer: Self-pay | Admitting: *Deleted

## 2013-05-30 ENCOUNTER — Ambulatory Visit (INDEPENDENT_AMBULATORY_CARE_PROVIDER_SITE_OTHER): Payer: 59 | Admitting: Internal Medicine

## 2013-05-30 ENCOUNTER — Encounter: Payer: Self-pay | Admitting: Internal Medicine

## 2013-05-30 VITALS — BP 132/72 | HR 75 | Temp 98.7°F | Resp 12 | Wt 292.0 lb

## 2013-05-30 DIAGNOSIS — E1165 Type 2 diabetes mellitus with hyperglycemia: Principal | ICD-10-CM

## 2013-05-30 DIAGNOSIS — E1129 Type 2 diabetes mellitus with other diabetic kidney complication: Secondary | ICD-10-CM

## 2013-05-30 MED ORDER — ACCU-CHEK AVIVA PLUS W/DEVICE KIT: PACK | Status: DC

## 2013-05-30 MED ORDER — ACCU-CHEK SOFTCLIX LANCETS MISC: Status: DC

## 2013-05-30 MED ORDER — LINAGLIPTIN 5 MG PO TABS: 5.0000 mg | ORAL_TABLET | Freq: Every day | ORAL | Status: DC

## 2013-05-30 MED ORDER — GLUCOSE BLOOD VI STRP: ORAL_STRIP | Status: DC

## 2013-05-30 MED ORDER — INSULIN DETEMIR 100 UNIT/ML ~~LOC~~ SOLN: 60.0000 [IU] | Freq: Every day | SUBCUTANEOUS | Status: DC

## 2013-05-30 NOTE — Patient Instructions (Addendum)
Please stop Lantus and start Levemir 60 units 2x a day. Stop Januvia and start Tradjenta 5 mg in am. Continue Metformin 1000 mg 2x a day. Continue Glipizide XL 10 mg daily.  Start Checking sugars at least 2x a day, at different times of the day, rotating checks.  Please stop at the lab.  Please return in 1 month with your sugar log.

## 2013-05-30 NOTE — Progress Notes (Signed)
Subjective:     Patient ID: Samuel Reed, male   DOB: 1960/02/01, 53 y.o.   MRN: [id number]  HPI Samuel Reed is a 53 y.o. man returning for management of DM2, dx 2003, insulin-dependent, uncontrolled, with complications (nephropathy: GFR 72, MAU-143 in 05/2011; retinopathy OU per eye exam 08/25/2011, neuropathy - numbness and tingling in feet). Last visit was 1 year ago.  Last HbA1C: Lab Results  Component Value Date   HGBA1C 9.5* 11/25/2012   He is on a regimen of: - Lantus 60 units q12h (vial) - was on 65 >> lows - Metformin 1000 mg bid - Januvia 100 mg daily - Glipizide XL 10 mg daily  He does not check sugars >> does not have a meter (it is too old). Reviewed checks from last time: - A.m.: 166, 132, 174, 386, 204, 132 - Before dinner: 92, 252, 117, 213, 97 - Nighttime: 123, 164, 116 He has hypoglycemia awareness, but unclear at what sugar level.  He had an eye exam 11/2012 ago, reportedly mild DR.   He has mild CKD. He is on Lisinopril 40 mg daily for BP management and microalbuminuria (MAU).  Lab Results  Component Value Date   BUN 18 11/25/2012   CREATININE 1.1 11/25/2012   He also has HyperTG-emia. On Crestor 20 mg daily.  Lab Results  Component Value Date   CHOL 157 11/25/2012   HDL 36.00* 11/25/2012   LDLCALC 29 02/17/2008   LDLDIRECT 64.3 11/25/2012   TRIG 366.0* 11/25/2012   CHOLHDL 4 11/25/2012   He also has OSA and is on CPAP. 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 Dr Rollene Fare saw him in the past for CP >> a cath was clean. He has OSA and wears CPAP every night. CP resolved after addition of CPAP.   Review of Systems Constitutional: no weight gain, + fatigue, no subjective hyperthermia/hypothermia; no increased thirst or urination Eyes: no blurry vision, no xerophthalmia ENT: no sore throat, no nodules palpated in throat, no dysphagia/odynophagia, no hoarseness Cardiovascular: no CP/SOB/palpitations/ + leg swelling Respiratory: no cough/SOB,  has a  stuffy nose Gastrointestinal: no N/V/D/C Musculoskeletal: no muscle/ joint aches Skin: no rashes Neurological: no tremors/numbness/tingling/dizziness Psychiatric: no depression/anxiety Low libido, diff. With erections  I reviewed pt's medications, allergies, PMH, social hx, family hx and no changes required, except as mentioned above.  Objective:   Physical Exam BP 132/72  Pulse 75  Temp(Src) 98.7 F (37.1 C) (Oral)  Resp 12  Wt 292 lb (132.45 kg)  SpO2 96% Wt Readings from Last 3 Encounters:  05/30/13 292 lb (132.45 kg)  02/17/13 295 lb (133.811 kg)  02/03/13 295 lb 8 oz (134.038 kg)   Constitutional: obese, in NAD Eyes: PERRLA, EOMI, no exophthalmos ENT: moist mucous membranes, no thyromegaly, no cervical lymphadenopathy Cardiovascular: RRR, No MRG, LE swelling pitting 2+ bilat. Respiratory: CTA B Gastrointestinal: abdomen soft, NT, ND, BS+ Musculoskeletal: no deformities, strength intact in all 4 Skin: moist, warm, stasis dermatitis bilat. LE Neurological: no tremor with outstretched hands, DTR normal in all 4  Assessment:     1. 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  - we will maintain current regimen and advise him to start checking sugars - also referred to nutrition - we will change his regimen only to allow for lower tier level: Patient Instructions  Please stop Lantus and start Levemir 60 units 2x a day. Stop Januvia and start Tradjenta 5 mg in am. Continue Metformin 1000 mg 2x a day. Continue Glipizide XL 10 mg daily. Start Checking sugars at least 2x a day, at different times of the day, rotating checks. Please stop at  the lab. Please return in 1 month with your sugar log. - sent Levemir vials and Tradjenta to the pharmacy - will check HbA1C and BMP today - given more sugar logs - Return to clinic in 1 months with his sugar log   Office Visit on 05/30/2013  Component Date Value Ref Range Status  . Hemoglobin A1C 05/30/2013 9.9* 4.6 - 6.5 % Final   Glycemic Control Guidelines for People with Diabetes:Non Diabetic:  <6%Goal of Therapy: <7%Additional Action Suggested:  >8%   . Sodium 05/30/2013 139  135 - 145 mEq/L Final  . Potassium 05/30/2013 3.8  3.5 - 5.1 mEq/L Final  . Chloride 05/30/2013 102  96 - 112 mEq/L Final  . CO2 05/30/2013 27  19 - 32 mEq/L Final  . Glucose, Bld 05/30/2013 336* 70 - 99 mg/dL Final  . BUN 05/30/2013 33* 6 - 23 mg/dL Final  . Creatinine, Ser 05/30/2013 1.9* 0.4 - 1.5 mg/dL Final  . Calcium 05/30/2013 9.2  8.4 - 10.5 mg/dL Final  . GFR 05/30/2013 41.02* >60.00 mL/min Final   Msg sent: Dear Samuel Reed,  The HbA1c has increased and the blood sugar is also very high. As a consequence, the kidney function is down. Please check your sugars, stay well hydrated and continue your medicines. If, at next visit, the creatinine (kidney test) is still higher than 1.5, we need to stop Metformin.  Sincerely,  Philemon Kingdom MD

## 2013-05-31 LAB — BASIC METABOLIC PANEL
BUN: 33 mg/dL — ABNORMAL HIGH (ref 6–23)
CALCIUM: 9.2 mg/dL (ref 8.4–10.5)
CHLORIDE: 102 meq/L (ref 96–112)
CO2: 27 meq/L (ref 19–32)
Creatinine, Ser: 1.9 mg/dL — ABNORMAL HIGH (ref 0.4–1.5)
GFR: 41.02 mL/min — ABNORMAL LOW (ref >60.00)
Glucose, Bld: 336 mg/dL — ABNORMAL HIGH (ref 70–99)
Potassium: 3.8 mEq/L (ref 3.5–5.1)
Sodium: 139 mEq/L (ref 135–145)

## 2013-05-31 LAB — HEMOGLOBIN A1C: HEMOGLOBIN A1C: 9.9 % — AB (ref 4.6–6.5)

## 2013-06-11 ENCOUNTER — Other Ambulatory Visit: Payer: Self-pay | Admitting: Internal Medicine

## 2013-06-22 ENCOUNTER — Ambulatory Visit: Payer: 59 | Admitting: Internal Medicine

## 2013-06-30 ENCOUNTER — Ambulatory Visit: Payer: 59 | Admitting: Internal Medicine

## 2013-07-04 ENCOUNTER — Telehealth: Payer: Self-pay | Admitting: Internal Medicine

## 2013-07-04 ENCOUNTER — Other Ambulatory Visit: Payer: Self-pay | Admitting: *Deleted

## 2013-07-04 MED ORDER — INSULIN DETEMIR 100 UNIT/ML ~~LOC~~ SOLN: 60.0000 [IU] | Freq: Every day | SUBCUTANEOUS | Status: DC

## 2013-07-04 NOTE — Telephone Encounter (Signed)
Patient states that Dr. Cruzita Lederer changed his insulin to Levemir  Its working great; patient takes 60 units and pen is only giving patient 15 days   Patient is needing a refill today as he will be out   Thank You :)

## 2013-07-20 ENCOUNTER — Ambulatory Visit: Payer: 59 | Admitting: Internal Medicine

## 2013-07-21 ENCOUNTER — Other Ambulatory Visit: Payer: Self-pay | Admitting: Internal Medicine

## 2013-08-04 ENCOUNTER — Ambulatory Visit: Payer: 59 | Admitting: *Deleted

## 2013-08-09 ENCOUNTER — Ambulatory Visit: Payer: 59 | Admitting: Family Medicine

## 2013-08-16 ENCOUNTER — Other Ambulatory Visit: Payer: Self-pay | Admitting: Internal Medicine

## 2013-08-18 ENCOUNTER — Ambulatory Visit: Payer: 59 | Admitting: Internal Medicine

## 2013-08-18 DIAGNOSIS — Z0289 Encounter for other administrative examinations: Secondary | ICD-10-CM

## 2013-08-23 ENCOUNTER — Telehealth: Payer: Self-pay | Admitting: Cardiovascular Disease

## 2013-08-24 ENCOUNTER — Telehealth: Payer: Self-pay | Admitting: Internal Medicine

## 2013-08-24 NOTE — Telephone Encounter (Signed)
Pt left voicemail at Triage: Pt received his last Cpap machine from [facility], but they are no longer in business, pt has called the [facility] times and no one is returning his call. Pt said he needs new Cpap machine and is requesting that Dr. Silvio Pate help him get it

## 2013-08-25 NOTE — Telephone Encounter (Signed)
Rx mailed to patient.

## 2013-08-25 NOTE — Telephone Encounter (Signed)
Message left notifying patient. Asked him to call and advise if he wanted me to mail Rx or if he wanted to pick it up.

## 2013-08-25 NOTE — Telephone Encounter (Signed)
Rx written  Some insurances require specific information about the initial sleep study. If he has a copy, or remembers where it was done, we should try to get a hold of that

## 2013-08-29 NOTE — Telephone Encounter (Signed)
Closed encounter °

## 2013-09-08 LAB — HM DIABETES EYE EXAM

## 2013-09-15 ENCOUNTER — Other Ambulatory Visit: Payer: Self-pay | Admitting: Internal Medicine

## 2013-09-19 ENCOUNTER — Encounter: Payer: Self-pay | Admitting: Internal Medicine

## 2013-09-23 ENCOUNTER — Other Ambulatory Visit: Payer: Self-pay | Admitting: Internal Medicine

## 2013-09-27 ENCOUNTER — Other Ambulatory Visit: Payer: Self-pay | Admitting: Internal Medicine

## 2013-10-10 ENCOUNTER — Other Ambulatory Visit: Payer: Self-pay | Admitting: Internal Medicine

## 2013-10-11 ENCOUNTER — Other Ambulatory Visit: Payer: Self-pay

## 2013-10-11 MED ORDER — METOPROLOL TARTRATE 100 MG PO TABS: 100.0000 mg | ORAL_TABLET | Freq: Two times a day (BID) | ORAL | Status: DC

## 2013-10-11 NOTE — Telephone Encounter (Signed)
Pt request 3 month refill metoprolol to [facility]; pt gets better pricing for 3 month rx and pt already has appt scheduled for 10/16/13. Advised pt done.

## 2013-10-13 ENCOUNTER — Ambulatory Visit: Payer: 59 | Admitting: Internal Medicine

## 2013-10-16 ENCOUNTER — Ambulatory Visit: Payer: 59 | Admitting: Internal Medicine

## 2013-10-18 ENCOUNTER — Encounter: Payer: Self-pay | Admitting: Internal Medicine

## 2013-10-18 ENCOUNTER — Ambulatory Visit (INDEPENDENT_AMBULATORY_CARE_PROVIDER_SITE_OTHER): Payer: 59 | Admitting: Internal Medicine

## 2013-10-18 VITALS — BP 134/66 | HR 69 | Temp 98.6°F | Wt 266.0 lb

## 2013-10-18 DIAGNOSIS — E1129 Type 2 diabetes mellitus with other diabetic kidney complication: Secondary | ICD-10-CM

## 2013-10-18 DIAGNOSIS — E785 Hyperlipidemia, unspecified: Secondary | ICD-10-CM

## 2013-10-18 DIAGNOSIS — E1165 Type 2 diabetes mellitus with hyperglycemia: Principal | ICD-10-CM

## 2013-10-18 DIAGNOSIS — I10 Essential (primary) hypertension: Secondary | ICD-10-CM

## 2013-10-18 LAB — CBC WITH DIFFERENTIAL/PLATELET
BASOS PCT: 0.5 % (ref 0.0–3.0)
Basophils Absolute: 0 10*3/uL (ref 0.0–0.1)
Eosinophils Absolute: 0.3 10*3/uL (ref 0.0–0.7)
Eosinophils Relative: 3.4 % (ref 0.0–5.0)
HCT: 42.9 % (ref 39.0–52.0)
Hemoglobin: 14.7 g/dL (ref 13.0–17.0)
LYMPHS PCT: 21.4 % (ref 12.0–46.0)
Lymphs Abs: 1.9 10*3/uL (ref 0.7–4.0)
MCHC: 34.3 g/dL (ref 30.0–36.0)
MCV: 83.3 fl (ref 78.0–100.0)
MONO ABS: 0.4 10*3/uL (ref 0.1–1.0)
Monocytes Relative: 5.1 % (ref 3.0–12.0)
NEUTROS PCT: 69.6 % (ref 43.0–77.0)
Neutro Abs: 6 10*3/uL (ref 1.4–7.7)
Platelets: 206 10*3/uL (ref 150.0–400.0)
RBC: 5.15 Mil/uL (ref 4.22–5.81)
RDW: 13.5 % (ref 11.5–15.5)
WBC: 8.6 10*3/uL (ref 4.0–10.5)

## 2013-10-18 LAB — COMPREHENSIVE METABOLIC PANEL
ALBUMIN: 4 g/dL (ref 3.5–5.2)
ALK PHOS: 99 U/L (ref 39–117)
ALT: 25 U/L (ref 0–53)
AST: 20 U/L (ref 0–37)
BUN: 37 mg/dL — ABNORMAL HIGH (ref 6–23)
CO2: 28 mEq/L (ref 19–32)
Calcium: 9.9 mg/dL (ref 8.4–10.5)
Chloride: 97 mEq/L (ref 96–112)
Creatinine, Ser: 1.5 mg/dL (ref 0.4–1.5)
GFR: 51.39 mL/min — ABNORMAL LOW (ref >60.00)
GLUCOSE: 396 mg/dL — AB (ref 70–99)
POTASSIUM: 4.6 meq/L (ref 3.5–5.1)
Sodium: 135 mEq/L (ref 135–145)
Total Bilirubin: 1 mg/dL (ref 0.2–1.2)
Total Protein: 7.5 g/dL (ref 6.0–8.3)

## 2013-10-18 LAB — LIPID PANEL
CHOL/HDL RATIO: 6
CHOLESTEROL: 123 mg/dL (ref 0–200)
HDL: 21.6 mg/dL — ABNORMAL LOW (ref >39.00)
NonHDL: 101.4
VLDL: 130.2 mg/dL — ABNORMAL HIGH (ref 0.0–40.0)

## 2013-10-18 LAB — HEMOGLOBIN A1C: Hgb A1c MFr Bld: 13.1 % — ABNORMAL HIGH (ref 4.6–6.5)

## 2013-10-18 LAB — LDL CHOLESTEROL, DIRECT: Direct LDL: 28.8 mg/dL

## 2013-10-18 LAB — T4, FREE: Free T4: 0.82 ng/dL (ref 0.60–1.60)
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
## 2013-10-18 NOTE — Assessment & Plan Note (Signed)
No problems with statin 

## 2013-10-18 NOTE — Assessment & Plan Note (Signed)
BP Readings from Last 3 Encounters:  10/18/13 134/66  05/30/13 132/72  05/10/13 160/76   Good control now

## 2013-10-18 NOTE — Progress Notes (Signed)
Pre visit review using our clinic review tool, if applicable. No additional management support is needed unless otherwise documented below in the visit note. 

## 2013-10-18 NOTE — Assessment & Plan Note (Signed)
Has really made lifestyle changes Will recheck a1c

## 2013-10-25 ENCOUNTER — Other Ambulatory Visit: Payer: Self-pay | Admitting: Internal Medicine

## 2013-10-27 ENCOUNTER — Ambulatory Visit (INDEPENDENT_AMBULATORY_CARE_PROVIDER_SITE_OTHER): Payer: 59 | Admitting: Internal Medicine

## 2013-10-27 ENCOUNTER — Encounter: Payer: Self-pay | Admitting: Internal Medicine

## 2013-10-27 VITALS — BP 132/64 | HR 86 | Temp 98.0°F | Resp 12 | Wt 269.0 lb

## 2013-10-27 DIAGNOSIS — IMO0002 Reserved for concepts with insufficient information to code with codable children: Secondary | ICD-10-CM

## 2013-10-27 DIAGNOSIS — E1165 Type 2 diabetes mellitus with hyperglycemia: Secondary | ICD-10-CM

## 2013-10-27 DIAGNOSIS — E1129 Type 2 diabetes mellitus with other diabetic kidney complication: Secondary | ICD-10-CM

## 2013-10-27 MED ORDER — INSULIN ASPART 100 UNIT/ML FLEXPEN: 7.0000 [IU] | PEN_INJECTOR | Freq: Three times a day (TID) | SUBCUTANEOUS | Status: DC

## 2013-10-27 MED ORDER — INSULIN PEN NEEDLE 32G X 4 MM MISC: Status: DC

## 2013-10-27 NOTE — Patient Instructions (Signed)
Continue: - Levemir 60 units once a day - Metformin 1000 mg 2x a day Stop: - Tradjenta - Glipizide XL  Start: - Insulin NovoLog:  7 units with a smaller meal 9 units with a regular meal 11 units with a larger meal Please inject NovoLog 15 min before a meal.  Please return in 1 month with your sugar log.

## 2013-10-27 NOTE — Progress Notes (Signed)
Subjective:     Patient ID: Samuel Reed, male   DOB: 1960-12-22, 53 y.o.   MRN: [id number]  Rash   Mr Badenhop is a 53 y.o. man returning for management of DM2, dx 2003, insulin-dependent, uncontrolled, with complications (nephropathy: GFR 72, MAU-143 in 05/2011; retinopathy OU per eye exam 08/25/2011, neuropathy - numbness and tingling in feet). Last visit was 5 mo ago!  Last HbA1C: Lab Results  Component Value Date   HGBA1C 13.1* 10/18/2013   HGBA1C 9.9* 05/30/2013   HGBA1C 9.5* 11/25/2012   He is on a regimen of: - Levemir 60 units once a day - Metformin 1000 mg bid - Tradjenta 5 mg daily - Glipizide XL 10 mg daily  He did not bring log >> sugars range 125-225, 442 x1  - A.m.: 166, 132, 174, 386, 204, 132 >> n/c - 2h after lunch: 125-225 - Before dinner: 92, 252, 117, 213, 97 >> n/c - Nighttime: 123, 164, 116 ?? >> n/c He has hypoglycemia awareness, but unclear at what sugar level.  He had an eye exam 11/2012, reportedly mild DR.   Melas: - b'fast: cheerios, cornflake, splenda, 2% milk (drinks a lot of milk) - lunch: sandwich + chips or sweet carrots - dinner: pizza; chicken + green beans; salad + chicken breast; less starches - snacks: nutri grain bar; cottage cheese  He has mild CKD. He is on Lisinopril 40 mg daily for BP management and microalbuminuria (MAU).  Lab Results  Component Value Date   BUN 37* 10/18/2013   CREATININE 1.5 10/18/2013   He also has HyperTG-emia. On Crestor 20 mg daily.  Lab Results  Component Value Date   CHOL 123 10/18/2013   HDL 21.60* 10/18/2013   LDLCALC 29 02/17/2008   LDLDIRECT 28.8 10/18/2013   TRIG 651.0 Triglyceride is over 400; calculations on Lipids are invalid.* 10/18/2013   CHOLHDL 6 10/18/2013   He also has OSA and is on CPAP. 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 Dr Rollene Fare saw him in the past for CP >> a cath was clean. He has OSA and wears CPAP every night. CP resolved after addition of CPAP.    Review of Systems    Constitutional: + weight loss, no fatigue, no subjective hyperthermia/hypothermia; + increased urination at night Eyes: no blurry vision, no xerophthalmia ENT: no sore throat, no nodules palpated in throat, no dysphagia/odynophagia, no hoarseness Cardiovascular: no CP/SOB/palpitations/ + leg swelling Respiratory: no cough/SOB,  has a stuffy nose Gastrointestinal: no N/V/D/C Musculoskeletal: no muscle/ joint aches Skin: no rashes Neurological: no tremors/numbness/tingling/dizziness +diff. With erections  I reviewed pt's medications, allergies, PMH, social hx, family hx and no changes required, except as mentioned above.  Objective:   Physical Exam BP 132/64  Pulse 86  Temp(Src) 98 F (36.7 C) (Oral)  Resp 12  Wt 269 lb (122.018 kg)  SpO2 97% Wt Readings from Last 3 Encounters:  10/27/13 269 lb (122.018 kg)  10/18/13 266 lb (120.657 kg)  05/30/13 292 lb (132.45 kg)   Constitutional: obese, in NAD Eyes: PERRLA, EOMI, no exophthalmos ENT: moist mucous membranes, no thyromegaly, no cervical lymphadenopathy Cardiovascular: RRR, No MRG, LE swelling pitting 2+ bilat. Respiratory: CTA B Gastrointestinal: abdomen soft, NT, ND, BS+ Musculoskeletal: no deformities, strength intact in all 4 Skin: moist, warm, stasis dermatitis bilat. LE Neurological: no tremor with outstretched hands, DTR normal in all 4  Assessment:     1. 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 Sugars much worse per HbA1c. - We will need mealtime insulin: Patient Instructions  Continue: - Levemir 60 units once a day - Metformin 1000 mg 2x a day Stop: - Tradjenta - Glipizide XL  Start: - Insulin NovoLog:  7 units  with a smaller meal 9 units with a regular meal 11 units with a larger meal Please inject NovoLog 15 min before a meal.  Please return in 1 month with your sugar log.   - sent pens and pen needles to the pharmacy - given more sugar logs - Return to clinic in 1 month with his sugar log

## 2013-10-30 ENCOUNTER — Other Ambulatory Visit: Payer: Self-pay | Admitting: Internal Medicine

## 2013-10-30 ENCOUNTER — Encounter: Payer: Self-pay | Admitting: Internal Medicine

## 2013-11-08 ENCOUNTER — Other Ambulatory Visit: Payer: Self-pay | Admitting: Internal Medicine

## 2013-11-30 ENCOUNTER — Other Ambulatory Visit: Payer: Self-pay | Admitting: *Deleted

## 2013-11-30 MED ORDER — METFORMIN HCL 1000 MG PO TABS: 1000.0000 mg | ORAL_TABLET | Freq: Two times a day (BID) | ORAL | Status: DC

## 2013-12-01 ENCOUNTER — Ambulatory Visit (INDEPENDENT_AMBULATORY_CARE_PROVIDER_SITE_OTHER): Payer: 59 | Admitting: Internal Medicine

## 2013-12-01 ENCOUNTER — Encounter: Payer: Self-pay | Admitting: Internal Medicine

## 2013-12-01 VITALS — BP 120/68 | HR 80 | Temp 98.5°F | Wt 264.6 lb

## 2013-12-01 VITALS — BP 146/72 | HR 88 | Temp 98.0°F | Ht 72.0 in | Wt 265.0 lb

## 2013-12-01 DIAGNOSIS — E08319 Diabetes mellitus due to underlying condition with unspecified diabetic retinopathy without macular edema: Secondary | ICD-10-CM

## 2013-12-01 DIAGNOSIS — E1121 Type 2 diabetes mellitus with diabetic nephropathy: Secondary | ICD-10-CM

## 2013-12-01 DIAGNOSIS — E1129 Type 2 diabetes mellitus with other diabetic kidney complication: Secondary | ICD-10-CM

## 2013-12-01 DIAGNOSIS — IMO0002 Reserved for concepts with insufficient information to code with codable children: Secondary | ICD-10-CM

## 2013-12-01 DIAGNOSIS — E785 Hyperlipidemia, unspecified: Secondary | ICD-10-CM

## 2013-12-01 DIAGNOSIS — E11319 Type 2 diabetes mellitus with unspecified diabetic retinopathy without macular edema: Secondary | ICD-10-CM | POA: Insufficient documentation

## 2013-12-01 DIAGNOSIS — I10 Essential (primary) hypertension: Secondary | ICD-10-CM

## 2013-12-01 DIAGNOSIS — E1165 Type 2 diabetes mellitus with hyperglycemia: Secondary | ICD-10-CM

## 2013-12-01 DIAGNOSIS — Z0000 Encounter for general adult medical examination without abnormal findings: Secondary | ICD-10-CM

## 2013-12-01 DIAGNOSIS — E781 Pure hyperglyceridemia: Secondary | ICD-10-CM

## 2013-12-01 LAB — HM DIABETES FOOT EXAM

## 2013-12-01 MED ORDER — GLUCOSE BLOOD VI STRP: ORAL_STRIP | Status: DC

## 2013-12-01 MED ORDER — INSULIN DETEMIR 100 UNIT/ML ~~LOC~~ SOLN: SUBCUTANEOUS | Status: DC

## 2013-12-01 MED ORDER — SILDENAFIL CITRATE 20 MG PO TABS: 60.0000 mg | ORAL_TABLET | Freq: Every day | ORAL | Status: DC | PRN

## 2013-12-01 MED ORDER — INSULIN ASPART 100 UNIT/ML FLEXPEN: 9.0000 [IU] | PEN_INJECTOR | Freq: Three times a day (TID) | SUBCUTANEOUS | Status: DC

## 2013-12-01 NOTE — Assessment & Plan Note (Signed)
UTD on preventative care and immunizations Really working on lifestyle
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
## 2013-12-01 NOTE — Assessment & Plan Note (Signed)
Mild  Following with eye doctor  Also mild neuropathy

## 2013-12-01 NOTE — Assessment & Plan Note (Signed)
BP Readings from Last 3 Encounters:  12/01/13 146/72  10/27/13 132/64  10/18/13 134/66   Up slightly today Will hold off on med changes since generally okay

## 2013-12-01 NOTE — Progress Notes (Signed)
Pre visit review using our clinic review tool, if applicable. No additional management support is needed unless otherwise documented below in the visit note. 

## 2013-12-01 NOTE — Assessment & Plan Note (Signed)
Lab Results  Component Value Date   LDLCALC 29 02/17/2008   Good control

## 2013-12-01 NOTE — Assessment & Plan Note (Signed)
On ACEI  stable

## 2013-12-01 NOTE — Assessment & Plan Note (Signed)
Seems to be improving with new regimen Follows with Dr Cruzita Lederer

## 2013-12-01 NOTE — Progress Notes (Signed)
Subjective:     Patient ID: Samuel Reed, male   DOB: August 15, 1960, 53 y.o.   MRN: [id number]  Rash   Samuel Reed is a 53 y.o. man returning for management of DM2, dx 2003, insulin-dependent, uncontrolled, with complications (nephropathy: GFR 72, MAU-143 in 05/2011; retinopathy OU per eye exam 08/25/2011, neuropathy - numbness and tingling in feet). Last visit was 1 mo ago.  Last HbA1C: Lab Results  Component Value Date   HGBA1C 13.1* 10/18/2013   HGBA1C 9.9* 05/30/2013   HGBA1C 9.5* 11/25/2012   He is on a regimen of: - Levemir 60 units once a day - Metformin 1000 mg bid - Mealtime insulin NovoLog - started 10/2013:  7 units with a smaller meal 9 units with a regular meal 11 units with a larger meal We stopped Tradjenta and Glipizide XL in 10/2013.  He did bring log >> improved: - A.m.: 166, 132, 174, 386, 204, 132 >> n/c >> 117-220, 249, 283 - before lunch: 179, 198 - 2h after lunch: 125-225 >> n/c - Before dinner: 92, 252, 117, 213, 97 >> n/c >> 191, 195, 255 - Nighttime: 123, 164, 116 ?? >> n/c >> 151,  205, 340 He has hypoglycemia awareness, but unclear at what sugar level.  He had an eye exam 09/09/2013, mild DR.   Melas: - b'fast: cheerios, cornflake, splenda, 2% milk (drinks a lot of milk) - lunch: sandwich + chips or sweet carrots - dinner: pizza; chicken + green beans; salad + chicken breast; less starches - snacks: nutri grain bar; cottage cheese  He is working out at [employer] 3x a week.  He has CKD. He is on Lisinopril 40 mg daily for BP management and microalbuminuria (MAU).  Lab Results  Component Value Date   BUN 37* 10/18/2013   CREATININE 1.5 10/18/2013   He also has HyperTG-emia. On Crestor 20 mg daily.  Lab Results  Component Value Date   CHOL 123 10/18/2013   HDL 21.60* 10/18/2013   LDLCALC 29 02/17/2008   LDLDIRECT 28.8 10/18/2013   TRIG * 10/18/2013    651.0 Triglyceride is over 400; calculations on Lipids are invalid.   CHOLHDL 6  10/18/2013   He also has OSA and is on CPAP. 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 Dr Rollene Fare saw him in the past for CP >> a cath was clean. He has OSA and wears CPAP every night. CP resolved after addition of CPAP.   Review of Systems Constitutional: + weight loss, no fatigue, no subjective hyperthermia/hypothermia ENT: no sore throat, no nodules palpated in throat, no dysphagia/odynophagia, no hoarseness Cardiovascular: no CP/SOB/palpitations/ + leg swelling Respiratory: no cough/SOB,  has a stuffy nose Gastrointestinal: no N/V/D/C Musculoskeletal: no muscle/+ joint aches Skin: no rashes Neurological: no tremors/numbness/tingling/dizziness +diff. With erections, + low libido   I reviewed pt's medications, allergies, PMH, social hx, family hx and no changes required, except as mentioned above.  Objective:   Physical Exam BP 120/68 mmHg  Pulse 80  Temp(Src) 98.5 F (36.9 C) (Oral)  Wt 264 lb 9.6 oz (120.022 kg) Body mass index is 35.88 kg/(m^2).  Wt Readings from Last 3 Encounters:  12/01/13 264 lb 9.6 oz (120.022 kg)  12/01/13 265 lb (120.203 kg)  10/27/13 269 lb (122.018 kg)   Constitutional: obese, in NAD Eyes: PERRLA, EOMI, no exophthalmos ENT: moist mucous membranes, no thyromegaly, no cervical lymphadenopathy Cardiovascular: RRR, No MRG, LE swelling pitting 2+ bilat. Respiratory: CTA B Gastrointestinal: abdomen  soft, NT, ND, BS+ Musculoskeletal: no deformities, strength intact in all 4 Skin: moist, warm, stasis dermatitis bilat. LE Neurological: no tremor with outstretched hands, DTR normal in all 4  Assessment:     1. DM2, insulin-dependent, uncontrolled, with complications  - nephropathy (GFR 72, MAU) - on Lisinopril - background retinopathy OU with small infarcts in each eye - no tx other than CBG control for now - [facility] - neuropathy  2. HTG    Plan:  1. OP - pt with uncontrolled DM2, returning after another long absence, w/o a  sugar log. Sugars much better (per log review) after starting mealtime insulin. - We will need mealtime insulin:  Patient Instructions  Continue: - Levemir 60 units once a day - Metformin 1000 mg 2x a day Increase  - Insulin NovoLog as follows:  9 units with a smaller meal 12 units with a regular meal 15 units with a larger meal Please inject NovoLog 15 min before a meal.  Please return in 2 months with your sugar log.   - had the flu vaccine this season - given more sugar logs - refilled strips and Levemir vials - no labs for today, reviewed HbA1c from last visit - Return to clinic in 1 month with his sugar log  2. HTG - reviewed his latest Lipid panel >> Tg high - suggested Fish oil

## 2013-12-01 NOTE — Patient Instructions (Signed)
Continue: - Levemir 60 units once a day - Metformin 1000 mg 2x a day Increase  - Insulin NovoLog as follows:  9 units with a smaller meal 12 units with a regular meal 15 units with a larger meal Please inject NovoLog 15 min before a meal.  Please return in 2 months with your sugar log.

## 2014-01-18 ENCOUNTER — Other Ambulatory Visit: Payer: Self-pay | Admitting: Internal Medicine

## 2014-02-02 ENCOUNTER — Ambulatory Visit: Payer: 59 | Admitting: Internal Medicine

## 2014-02-12 ENCOUNTER — Other Ambulatory Visit: Payer: Self-pay | Admitting: Internal Medicine

## 2014-02-13 ENCOUNTER — Encounter: Payer: Self-pay | Admitting: Internal Medicine

## 2014-02-13 ENCOUNTER — Ambulatory Visit (INDEPENDENT_AMBULATORY_CARE_PROVIDER_SITE_OTHER): Payer: 59 | Admitting: Internal Medicine

## 2014-02-13 VITALS — BP 144/82 | HR 73 | Temp 98.4°F | Wt 273.0 lb

## 2014-02-13 DIAGNOSIS — J0110 Acute frontal sinusitis, unspecified: Secondary | ICD-10-CM

## 2014-02-13 MED ORDER — AZITHROMYCIN 250 MG PO TABS: ORAL_TABLET | ORAL | Status: DC
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
## 2014-02-13 NOTE — Progress Notes (Signed)
Pre visit review using our clinic review tool, if applicable. No additional management support is needed unless otherwise documented below in the visit note. 

## 2014-02-13 NOTE — Patient Instructions (Signed)

## 2014-02-18 ENCOUNTER — Other Ambulatory Visit: Payer: Self-pay | Admitting: Internal Medicine

## 2014-02-28 ENCOUNTER — Other Ambulatory Visit: Payer: Self-pay | Admitting: Internal Medicine

## 2014-03-05 ENCOUNTER — Telehealth: Payer: Self-pay | Admitting: Internal Medicine

## 2014-03-05 ENCOUNTER — Other Ambulatory Visit: Payer: Self-pay | Admitting: *Deleted

## 2014-03-05 MED ORDER — INSULIN ASPART 100 UNIT/ML FLEXPEN
9.0000 [IU] | PEN_INJECTOR | Freq: Three times a day (TID) | SUBCUTANEOUS | Status: DC
Start: 2014-03-05 — End: 2014-06-26

## 2014-03-05 NOTE — Telephone Encounter (Signed)
Done

## 2014-03-05 NOTE — Telephone Encounter (Signed)
Will see him then 

## 2014-03-05 NOTE — Telephone Encounter (Signed)
[facility] scheduled appt with Dr Silvio Pate 03/06/14 at 12:30 pm for bilateral leg swelling.

## 2014-03-05 NOTE — Telephone Encounter (Signed)
Pt needs approval for his insulin today he is out

## 2014-03-05 NOTE — Telephone Encounter (Signed)
PLEASE NOTE: All timestamps contained within this report are represented as [place] Standard Time. CONFIDENTIALTY NOTICE: This fax transmission is intended only for the addressee. It contains information that is legally privileged, confidential or otherwise protected from use or disclosure. If you are not the intended recipient, you are strictly prohibited from reviewing, disclosing, copying using or disseminating any of this information or taking any action in reliance on or regarding this information. If you have received this fax in error, please notify us immediately by telephone so that we can arrange for its return to [place]. Phone: [phone], Toll-Free: [phone], Fax: [phone] Page: 1 of 1 Call Id: [id number] [place] Patient Name: SETHE GALYEN DOB: 02-25-1960 Initial Comment caller states that he has swelling in both feet and legs, chest discomfort Nurse Assessment Nurse: Loletta Specter, RN, Wells Guiles Date/Time (Eastern Time): 03/05/2014 3:10:18 PM Confirm and document reason for call. If symptomatic, describe symptoms. ---caller states that he has swelling in both feet and legs, occasional chest discomfort but not today.. Has the patient traveled out of the country within the last 30 days? ---Not Applicable Does the patient require triage? ---Yes Related visit to physician within the last 2 weeks? ---Yes Does the PT have any chronic conditions? (i.e. diabetes, asthma, etc.) ---Yes List chronic conditions. ---HTN, diabetes. Guidelines Guideline Title Affirmed Question Affirmed Notes Chest Pain [1] Chest pain lasts > 5 minutes AND [2] occurred > 3 days ago (72 hours) AND [3] NO chest pain or cardiac symptoms now Final Disposition User See Physician within [place], Therapist, sports, [employer]

## 2014-03-06 ENCOUNTER — Encounter: Payer: Self-pay | Admitting: Internal Medicine

## 2014-03-06 ENCOUNTER — Ambulatory Visit (INDEPENDENT_AMBULATORY_CARE_PROVIDER_SITE_OTHER): Payer: 59 | Admitting: Internal Medicine

## 2014-03-06 VITALS — BP 154/80 | HR 72 | Temp 98.4°F | Wt 270.2 lb

## 2014-03-06 DIAGNOSIS — I872 Venous insufficiency (chronic) (peripheral): Secondary | ICD-10-CM | POA: Insufficient documentation
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
## 2014-03-06 NOTE — Patient Instructions (Signed)
Please get support (compression) socks to wear on days when you will be on your feet a lot.  Low-Sodium Eating Plan Sodium raises blood pressure and causes water to be held in the body. Getting less sodium from food will help lower your blood pressure, reduce any swelling, and protect your heart, liver, and kidneys. We get sodium by adding salt (sodium chloride) to food. Most of our sodium comes from canned, boxed, and frozen foods. Restaurant foods, fast foods, and pizza are also very high in sodium. Even if you take medicine to lower your blood pressure or to reduce fluid in your body, getting less sodium from your food is important. WHAT IS MY PLAN? Most people should limit their sodium intake to 2,300 mg a day. Your health care provider recommends that you limit your sodium intake to __________ a day.  WHAT DO I NEED TO KNOW ABOUT THIS EATING PLAN? For the low-sodium eating plan, you will follow these general guidelines:  Choose foods with a % Daily Value for sodium of less than 5% (as listed on the food label).   Use salt-free seasonings or herbs instead of table salt or sea salt.   Check with your health care provider or pharmacist before using salt substitutes.   Eat fresh foods.  Eat more vegetables and fruits.  Limit canned vegetables. If you do use them, rinse them well to decrease the sodium.   Limit cheese to 1 oz (28 g) per day.   Eat lower-sodium products, often labeled as "lower sodium" or "no salt added."  Avoid foods that contain monosodium glutamate (MSG). MSG is sometimes added to [place] food and some canned foods.  Check food labels (Nutrition Facts labels) on foods to learn how much sodium is in one serving.  Eat more home-cooked food and less restaurant, buffet, and fast food.  When eating at a restaurant, ask that your food be prepared with less salt or none, if possible.  HOW DO I READ FOOD LABELS FOR SODIUM INFORMATION? The Nutrition Facts label  lists the amount of sodium in one serving of the food. If you eat more than one serving, you must multiply the listed amount of sodium by the number of servings. Food labels may also identify foods as:  Sodium free--Less than 5 mg in a serving.  Very low sodium--35 mg or less in a serving.  Low sodium--140 mg or less in a serving.  Light in sodium--50% less sodium in a serving. For example, if a food that usually has 300 mg of sodium is changed to become light in sodium, it will have 150 mg of sodium.  Reduced sodium--25% less sodium in a serving. For example, if a food that usually has 400 mg of sodium is changed to reduced sodium, it will have 300 mg of sodium. WHAT FOODS CAN I EAT? Grains Low-sodium cereals, including oats, puffed wheat and rice, and shredded wheat cereals. Low-sodium crackers. Unsalted rice and pasta. Lower-sodium bread.  Vegetables Frozen or fresh vegetables. Low-sodium or reduced-sodium canned vegetables. Low-sodium or reduced-sodium tomato sauce and paste. Low-sodium or reduced-sodium tomato and vegetable juices.  Fruits Fresh, frozen, and canned fruit. Fruit juice.  Meat and Other Protein Products Low-sodium canned tuna and salmon. Fresh or frozen meat, poultry, seafood, and fish. Lamb. Unsalted nuts. Dried beans, peas, and lentils without added salt. Unsalted canned beans. Homemade soups without salt. Eggs.  Dairy Milk. Soy milk. Ricotta cheese. Low-sodium or reduced-sodium cheeses. Yogurt.  Condiments Fresh and dried herbs and  spices. Salt-free seasonings. Onion and garlic powders. Low-sodium varieties of mustard and ketchup. Lemon juice.  Fats and Oils Reduced-sodium salad dressings. Unsalted butter.  Other Unsalted popcorn and pretzels.  The items listed above may not be a complete list of recommended foods or beverages. Contact your dietitian for more options. WHAT FOODS ARE NOT RECOMMENDED? Grains Instant hot cereals. Bread stuffing, pancake,  and biscuit mixes. Croutons. Seasoned rice or pasta mixes. Noodle soup cups. Boxed or frozen macaroni and cheese. Self-rising flour. Regular salted crackers. Vegetables Regular canned vegetables. Regular canned tomato sauce and paste. Regular tomato and vegetable juices. Frozen vegetables in sauces. Salted french fries. Olives. Samuel Reed. Relishes. Sauerkraut. Salsa. Meat and Other Protein Products Salted, canned, smoked, spiced, or pickled meats, seafood, or fish. Bacon, ham, sausage, hot dogs, corned beef, chipped beef, and packaged luncheon meats. Salt pork. Jerky. Pickled herring. Anchovies, regular canned tuna, and sardines. Salted nuts. Dairy Processed cheese and cheese spreads. Cheese curds. Blue cheese and cottage cheese. Buttermilk.  Condiments Onion and garlic salt, seasoned salt, table salt, and sea salt. Canned and packaged gravies. Worcestershire sauce. Tartar sauce. Barbecue sauce. Teriyaki sauce. Soy sauce, including reduced sodium. Steak sauce. Fish sauce. Oyster sauce. Cocktail sauce. Horseradish. Regular ketchup and mustard. Meat flavorings and tenderizers. Bouillon cubes. Hot sauce. Tabasco sauce. Marinades. Taco seasonings. Relishes. Fats and Oils Regular salad dressings. Salted butter. Margarine. Ghee. Bacon fat.  Other Potato and tortilla chips. Corn chips and puffs. Salted popcorn and pretzels. Canned or dried soups. Pizza. Frozen entrees and pot pies.  The items listed above may not be a complete list of foods and beverages to avoid. Contact your dietitian for more information. Document Released: 07/04/2001 Document Revised: 01/17/2013 Document Reviewed: 11/16/2012 [facility] Patient Information 2015 [place], [place]. This information is not intended to replace advice given to you by your health care provider. Make sure you discuss any questions you have with your health care provider.

## 2014-03-06 NOTE — Assessment & Plan Note (Signed)
Goes back 5 years but now worse Stopped using salt Had fairly normal cath ~ 5 years ago [facility]) Normal renal/hepatic function in September  Reassured Low salt diet Support socks

## 2014-03-06 NOTE — Progress Notes (Signed)
Pre visit review using our clinic review tool, if applicable. No additional management support is needed unless otherwise documented below in the visit note. 

## 2014-03-11 ENCOUNTER — Other Ambulatory Visit: Payer: Self-pay | Admitting: Internal Medicine

## 2014-03-14 ENCOUNTER — Other Ambulatory Visit: Payer: Self-pay | Admitting: Internal Medicine

## 2014-03-16 ENCOUNTER — Ambulatory Visit: Payer: 59 | Admitting: Internal Medicine

## 2014-04-05 ENCOUNTER — Ambulatory Visit: Payer: 59 | Admitting: Internal Medicine

## 2014-04-08 ENCOUNTER — Other Ambulatory Visit: Payer: Self-pay | Admitting: Internal Medicine

## 2014-05-17 ENCOUNTER — Ambulatory Visit (INDEPENDENT_AMBULATORY_CARE_PROVIDER_SITE_OTHER): Payer: 59 | Admitting: Internal Medicine

## 2014-05-17 ENCOUNTER — Encounter: Payer: Self-pay | Admitting: Internal Medicine

## 2014-05-17 VITALS — BP 144/76 | HR 83 | Temp 98.5°F | Resp 12 | Wt 273.6 lb

## 2014-05-17 DIAGNOSIS — E1129 Type 2 diabetes mellitus with other diabetic kidney complication: Secondary | ICD-10-CM

## 2014-05-17 DIAGNOSIS — IMO0002 Reserved for concepts with insufficient information to code with codable children: Secondary | ICD-10-CM

## 2014-05-17 DIAGNOSIS — E1165 Type 2 diabetes mellitus with hyperglycemia: Secondary | ICD-10-CM | POA: Diagnosis not present

## 2014-05-17 LAB — HEMOGLOBIN A1C: Hgb A1c MFr Bld: 13 % — ABNORMAL HIGH (ref 4.6–6.5)

## 2014-05-17 NOTE — Patient Instructions (Signed)
Please continue: - Metformin 1000 mg 2x a day - Levemir 60 units at bedtime - Mealtime insulin NovoLog:  9 units with a smaller meal 12-15 with a regular meal 20 units with a larger meal  Please stop at the lab.  Please return in 1 month with your sugar log.

## 2014-05-17 NOTE — Progress Notes (Signed)
Subjective:     Patient ID: [name], male   DOB: Jan 14, 1961, 54 y.o.   MRN: [id number]  HPI Mr Wellmann is a 54 y.o. man returning for management of DM2, dx 2003, insulin-dependent, uncontrolled, with complications (nephropathy: GFR 72, MAU-143 in 05/2011; retinopathy OU per eye exam 08/25/2011, neuropathy - numbness and tingling in feet). Last visit was 5 mo ago.  Last HbA1C: Lab Results  Component Value Date   HGBA1C 13.1* 10/18/2013   HGBA1C 9.9* 05/30/2013   HGBA1C 9.5* 11/25/2012   He is on a regimen of: - Metformin 1000 mg bid - Levemir 60 units at bedtime - Mealtime insulin NovoLog - started 10/2013:  9 units with a smaller meal 12-15 with a regular meal 20 units with a larger meal We stopped Tradjenta and Glipizide XL in 10/2013.  He does not bring a sugar log, did not check sugars for the last 3 months. - A.m.: 166, 132, 174, 386, 204, 132 >> n/c >> 117-220, 249, 283 - before lunch: 179, 198 - 2h after lunch: 125-225 >> n/c - Before dinner: 92, 252, 117, 213, 97 >> n/c >> 191, 195, 255 - Nighttime: 123, 164, 116 ?? >> n/c >> 151,  205, 340 He has hypoglycemia awareness, but unclear at what sugar level.  Meals - he increased meal portions since last visit.  No blurry vision, increased urination or thirst or weight loss.  He has CKD. He is on Lisinopril 40 mg daily for BP management and microalbuminuria (MAU).  Lab Results  Component Value Date   BUN 37* 10/18/2013   CREATININE 1.5 10/18/2013   He also has HyperTG-emia. On Crestor 20 mg daily.  Lab Results  Component Value Date   CHOL 123 10/18/2013   HDL 21.60* 10/18/2013   LDLCALC 29 02/17/2008   LDLDIRECT 28.8 10/18/2013   TRIG * 10/18/2013    651.0 Triglyceride is over 400; calculations on Lipids are invalid.   CHOLHDL 6 10/18/2013   Last eye exam was in 08/2013.  He also has OSA and is on CPAP. 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 Dr Rollene Fare saw him in the past for CP >> a cath was  clean. He has OSA and wears CPAP every night. CP resolved after addition of CPAP.   Review of Systems Constitutional: + weight gain, no fatigue, no subjective hyperthermia/hypothermia, + nocturia 1-2x (chronic) ENT: no sore throat, no nodules palpated in throat, no dysphagia/odynophagia, no hoarseness Cardiovascular: no CP/SOB/palpitations/ + leg swelling Respiratory: no cough/SOB,  has a stuffy nose Gastrointestinal: no N/V/D/C Musculoskeletal: no muscle/+ joint aches Skin: no rashes Neurological: no tremors/numbness/tingling/dizziness +diff. With erections, + low libido  I reviewed pt's medications, allergies, PMH, social hx, family hx, and changes were documented in the history of present illness. Otherwise, unchanged from my initial visit note.  Objective:   Physical Exam BP 144/76 mmHg  Pulse 83  Temp(Src) 98.5 F (36.9 C) (Oral)  Resp 12  Wt 273 lb 9.6 oz (124.104 kg)  SpO2 96% Body mass index is 37.1 kg/(m^2).  Wt Readings from Last 3 Encounters:  05/17/14 273 lb 9.6 oz (124.104 kg)  03/06/14 270 lb 4 oz (122.585 kg)  02/13/14 273 lb (123.832 kg)   Constitutional: obese, in NAD Eyes: PERRLA, EOMI, no exophthalmos ENT: moist mucous membranes, no thyromegaly, no cervical lymphadenopathy Cardiovascular: RRR, No MRG, LE swelling pitting 1+ bilat. Respiratory: CTA B Gastrointestinal: abdomen soft, NT, ND, BS+ Musculoskeletal: no deformities, strength intact  in all 4 Skin: moist, warm, stasis dermatitis bilat. LE Neurological: no tremor with outstretched hands, DTR normal in all 4  Assessment:     1. 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 DM2 - pt with uncontrolled DM2, returning after another long absence, w/o a sugar log and w/o checking sugars in the last  3 mo. Strongly advised him to get back on track with checking sugars and I will see him in 1 mo. I cannot adjust his regimen yet.  Patient Instructions  Please continue: - Metformin 1000 mg 2x a day - Levemir 60 units at bedtime - Mealtime insulin NovoLog:  9 units with a smaller meal 12-15 with a regular meal 20 units with a larger meal  Please stop at the lab.  Please return in 1 month with your sugar log.   - given more sugar logs - check HbA1c today - Return to clinic in 1 month with his sugar log  Office Visit on 05/17/2014  Component Date Value Ref Range Status  . Hgb A1c MFr Bld 05/17/2014 13.0* 4.6 - 6.5 % Final   Glycemic Control Guidelines for People with Diabetes:Non Diabetic:  <6%Goal of Therapy: <7%Additional Action Suggested:  >8%    HbA1c very high. See plan above.

## 2014-05-23 ENCOUNTER — Other Ambulatory Visit: Payer: Self-pay | Admitting: Internal Medicine

## 2014-05-30 ENCOUNTER — Other Ambulatory Visit: Payer: Self-pay | Admitting: Internal Medicine

## 2014-06-12 ENCOUNTER — Other Ambulatory Visit: Payer: Self-pay | Admitting: Internal Medicine

## 2014-06-15 ENCOUNTER — Other Ambulatory Visit: Payer: Self-pay | Admitting: Internal Medicine

## 2014-06-16 ENCOUNTER — Emergency Department
Admission: EM | Admit: 2014-06-16 | Discharge: 2014-06-16 | Disposition: A | Discharge status: Home / Self Care | Payer: 59 | Attending: Emergency Medicine | Admitting: Emergency Medicine

## 2014-06-16 ENCOUNTER — Encounter: Payer: Self-pay | Admitting: *Deleted

## 2014-06-16 DIAGNOSIS — I10 Essential (primary) hypertension: Secondary | ICD-10-CM | POA: Insufficient documentation

## 2014-06-16 DIAGNOSIS — H5711 Ocular pain, right eye: Secondary | ICD-10-CM

## 2014-06-16 DIAGNOSIS — E119 Type 2 diabetes mellitus without complications: Secondary | ICD-10-CM | POA: Diagnosis not present

## 2014-06-16 DIAGNOSIS — Z7951 Long term (current) use of inhaled steroids: Secondary | ICD-10-CM | POA: Insufficient documentation

## 2014-06-16 DIAGNOSIS — Z79899 Other long term (current) drug therapy: Secondary | ICD-10-CM | POA: Diagnosis not present

## 2014-06-16 DIAGNOSIS — Z794 Long term (current) use of insulin: Secondary | ICD-10-CM | POA: Diagnosis not present

## 2014-06-16 DIAGNOSIS — R51 Headache: Secondary | ICD-10-CM | POA: Insufficient documentation

## 2014-06-16 DIAGNOSIS — Z7982 Long term (current) use of aspirin: Secondary | ICD-10-CM | POA: Diagnosis not present

## 2014-06-16 MED ORDER — SULFAMETHOXAZOLE-TRIMETHOPRIM 800-160 MG PO TABS
1.0000 | ORAL_TABLET | Freq: Once | ORAL | Status: AC
  Administered 2014-06-16: 1 via ORAL

## 2014-06-16 MED ORDER — SULFAMETHOXAZOLE-TRIMETHOPRIM 800-160 MG PO TABS
ORAL_TABLET | ORAL | Status: AC
  Filled 2014-06-16: qty 1

## 2014-06-16 MED ORDER — OXYCODONE-ACETAMINOPHEN 5-325 MG PO TABS
ORAL_TABLET | ORAL | Status: DC
Start: 2014-06-16 — End: 2014-06-16
  Filled 2014-06-16: qty 2

## 2014-06-16 MED ORDER — OXYCODONE-ACETAMINOPHEN 5-325 MG PO TABS
2.0000 | ORAL_TABLET | Freq: Once | ORAL | Status: AC
  Administered 2014-06-16: 2 via ORAL

## 2014-06-16 MED ORDER — OXYCODONE-ACETAMINOPHEN 5-325 MG PO TABS: 1.0000 | ORAL_TABLET | Freq: Four times a day (QID) | ORAL | Status: DC | PRN

## 2014-06-16 MED ORDER — SULFAMETHOXAZOLE-TRIMETHOPRIM 800-160 MG PO TABS: 1.0000 | ORAL_TABLET | Freq: Two times a day (BID) | ORAL | Status: DC
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
## 2014-06-16 NOTE — Discharge Instructions (Signed)
Sinus Headache A sinus headache happens when your sinuses become clogged or puffy (swollen). Sinus headaches can be mild or severe. HOME CARE  Take your medicines (antibiotics) as told. Finish them even if you start to feel better.  Only take medicine as told by your doctor.  Use a nose spray if you feel stuffed up (congested). GET HELP RIGHT AWAY IF:  You have a fever.  You have trouble seeing.  You suddenly have pain in your face or head.  You start to twitch or shake (seizure).  You are confused.  You get headaches more than once a week.  Light or sound bothers you.  You feel sick to your stomach (nauseous) or throw up (vomit).  Your headaches do not get better with treatment. MAKE SURE YOU:  Understand these instructions.  Will watch your condition.  Will get help right away if you are not doing well or get worse. Document Released: 05/14/2010 Document Revised: 04/06/2011 Document Reviewed: 05/14/2010 [facility] Patient Information 2015 [place], [place]. This information is not intended to replace advice given to you by your health care provider. Make sure you discuss any questions you have with your health care provider.

## 2014-06-16 NOTE — ED Notes (Addendum)
Pt c/o sinus pain, hx of sinus infections. Pt states pain started yesterday midday, pain woke him an hour ago. Pt took ibuprofen 400 mg when he woke an hour ago w/o relief.

## 2014-06-19 ENCOUNTER — Ambulatory Visit: Payer: 59 | Admitting: Internal Medicine

## 2014-06-26 ENCOUNTER — Other Ambulatory Visit: Payer: Self-pay | Admitting: Internal Medicine

## 2014-07-03 ENCOUNTER — Other Ambulatory Visit: Payer: Self-pay | Admitting: Internal Medicine

## 2014-07-05 ENCOUNTER — Telehealth: Payer: Self-pay | Admitting: *Deleted

## 2014-07-05 MED ORDER — METFORMIN HCL 1000 MG PO TABS: ORAL_TABLET | ORAL | Status: DC

## 2014-07-05 NOTE — Telephone Encounter (Signed)
Refill sent to pharmacy.   

## 2014-07-10 ENCOUNTER — Other Ambulatory Visit: Payer: Self-pay | Admitting: Internal Medicine

## 2014-07-23 ENCOUNTER — Other Ambulatory Visit: Payer: Self-pay

## 2014-07-25 ENCOUNTER — Other Ambulatory Visit: Payer: Self-pay | Admitting: Internal Medicine

## 2014-08-20 ENCOUNTER — Ambulatory Visit: Payer: 59 | Admitting: Internal Medicine

## 2014-09-01 ENCOUNTER — Other Ambulatory Visit: Payer: Self-pay | Admitting: Internal Medicine

## 2014-09-07 ENCOUNTER — Other Ambulatory Visit (INDEPENDENT_AMBULATORY_CARE_PROVIDER_SITE_OTHER): Payer: 59 | Admitting: *Deleted

## 2014-09-07 ENCOUNTER — Ambulatory Visit (INDEPENDENT_AMBULATORY_CARE_PROVIDER_SITE_OTHER): Payer: 59 | Admitting: Internal Medicine

## 2014-09-07 ENCOUNTER — Encounter: Payer: Self-pay | Admitting: Internal Medicine

## 2014-09-07 VITALS — BP 138/70 | HR 77 | Temp 98.9°F | Resp 12 | Wt 256.8 lb

## 2014-09-07 DIAGNOSIS — E1165 Type 2 diabetes mellitus with hyperglycemia: Secondary | ICD-10-CM | POA: Diagnosis not present

## 2014-09-07 DIAGNOSIS — E1129 Type 2 diabetes mellitus with other diabetic kidney complication: Secondary | ICD-10-CM

## 2014-09-07 DIAGNOSIS — IMO0002 Reserved for concepts with insufficient information to code with codable children: Secondary | ICD-10-CM

## 2014-09-07 LAB — POCT GLYCOSYLATED HEMOGLOBIN (HGB A1C): Hemoglobin A1C: 13.3

## 2014-09-07 NOTE — Patient Instructions (Signed)
Please continue: - Metformin 1000 mg 2x a day - Levemir 60 units at bedtime - Mealtime insulin NovoLog:  9 units with a smaller meal 12-15 with a regular meal 20 units with a larger meal  Please return in 1 month with your sugar log.   Please send me your sugar log in 2 weeks.

## 2014-09-07 NOTE — Progress Notes (Signed)
Subjective:     Patient ID: Samuel Reed, male   DOB: [birth year], 54 y.o.   MRN: [id number]  HPI Samuel Reed is a 54 y.o. man returning for management of DM2, dx 2003, insulin-dependent, uncontrolled, with complications (nephropathy: GFR 72, MAU-143 in 05/2011; retinopathy OU per eye exam 08/25/2011, neuropathy - numbness and tingling in feet). Last visit was 4 mo ago.  He started to cut back on carbs, drinking more water. Also started to exercise 3x a week - situps, squats, leg lifts, stretching, bending, walking.   Last HbA1C: Lab Results  Component Value Date   HGBA1C 13.0* 05/17/2014   HGBA1C 13.1* 10/18/2013   HGBA1C 9.9* 05/30/2013   He is on a regimen of: - Metformin 1000 mg bid - Levemir 60 units at bedtime - Mealtime insulin NovoLog - started 10/2013 - may miss the lunch insulin.  9 units with a smaller meal 12-15 with a regular meal 20 units with a larger meal We stopped Tradjenta and Glipizide XL in 10/2013.  He does not bring a sugar log, he again did not check sugars for the last 4 months. Reviewed sugars from last year: - A.m.: 166, 132, 174, 386, 204, 132 >> n/c >> 117-220, 249, 283 - before lunch: 179, 198 - 2h after lunch: 125-225 >> n/c - Before dinner: 92, 252, 117, 213, 97 >> n/c >> 191, 195, 255 - Nighttime: 123, 164, 116 ?? >> n/c >> 151,  205, 340 He has hypoglycemia awareness, but unclear at what sugar level.  He has CKD. He is on Lisinopril 40 mg daily for BP management and microalbuminuria (MAU).  Lab Results  Component Value Date   BUN 37* 10/18/2013   CREATININE 1.5 10/18/2013   He also has HyperTG-emia. On Crestor 20 mg daily.  Lab Results  Component Value Date   CHOL 123 10/18/2013   HDL 21.60* 10/18/2013   LDLCALC 29 02/17/2008   LDLDIRECT 28.8 10/18/2013   TRIG * 10/18/2013    651.0 Triglyceride is over 400; calculations on Lipids are invalid.   CHOLHDL 6 10/18/2013   Last eye exam was in 08/2013.  He also has OSA and is on CPAP. 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 Dr Rollene Fare saw him in the past for CP >> a cath was clean. He has OSA and wears CPAP every night. CP resolved after addition of CPAP.   Review of Systems  Skin: Positive for rash.  Error >> no rash.  Constitutional: + weight loss, no fatigue, no subjective hyperthermia/hypothermia ENT: no sore throat, no nodules palpated in throat, no dysphagia/odynophagia, no hoarseness Cardiovascular: no CP/SOB/palpitations/ + leg swelling Respiratory: no cough/SOB,  has a stuffy nose Gastrointestinal: no N/V/D/C Musculoskeletal: no muscle/+ joint aches Skin: no rashes Neurological: no tremors/numbness/tingling/dizziness +diff. With erections, + low libido  I reviewed pt's medications, allergies, PMH, social hx, family hx, and changes were documented in the history of present illness. Otherwise, unchanged from my initial visit note.  Objective:   Physical Exam BP 138/70 mmHg  Pulse 77  Temp(Src) 98.9 F (37.2 C) (Oral)  Resp 12  Wt 256 lb 12.8 oz (116.484 kg)  SpO2 97% Body mass index is 35.83 kg/(m^2).  Wt Readings from Last 3 Encounters:  09/07/14 256 lb 12.8 oz (116.484 kg)  06/16/14 271 lb (122.925 kg)  05/17/14 273 lb 9.6 oz (124.104 kg)   Constitutional: obese, in NAD Eyes: PERRLA, EOMI, no exophthalmos ENT: moist mucous membranes, no thyromegaly,  no cervical lymphadenopathy Cardiovascular: RRR, No MRG, LE swelling pitting 1+ bilat. Respiratory: CTA B Gastrointestinal: abdomen soft, NT, ND, BS+ Musculoskeletal: no deformities, strength intact in all 4 Skin: moist, warm, stasis dermatitis bilat. LE Neurological: no tremor with outstretched hands, DTR normal in all 4  Assessment:     1. 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 DM2 - pt with uncontrolled DM2, returning after 4 rather than 1 mo, w/o a sugar log and w/o checking sugars in the last 4 mo. Strongly advised him to get back on track with checking sugars and I will see him in 1 mo. I cannot adjust his regimen yet. I advised him to fax me his log in 2 weeks. Patient Instructions  Please continue: - Metformin 1000 mg 2x a day - Levemir 60 units at bedtime - Mealtime insulin NovoLog:  9 units with a smaller meal 12-15 with a regular meal 20 units with a larger meal  Please return in 1 month with your sugar log.   Please send me your sugar log in 2 weeks.  - given more sugar logs - check HbA1c today >> 13.3% (higher!) - Return to clinic in 1 month with his sugar log

## 2014-09-11 ENCOUNTER — Other Ambulatory Visit: Payer: Self-pay | Admitting: Internal Medicine

## 2014-09-21 LAB — HM DIABETES EYE EXAM

## 2014-09-28 ENCOUNTER — Other Ambulatory Visit: Payer: Self-pay | Admitting: Internal Medicine

## 2014-10-17 ENCOUNTER — Encounter: Payer: Self-pay | Admitting: Internal Medicine

## 2014-10-18 ENCOUNTER — Telehealth: Payer: Self-pay | Admitting: Internal Medicine

## 2014-10-18 ENCOUNTER — Telehealth: Payer: Self-pay | Admitting: *Deleted

## 2014-10-18 MED ORDER — INSULIN DETEMIR 100 UNIT/ML ~~LOC~~ SOLN: 65.0000 [IU] | Freq: Every day | SUBCUTANEOUS | Status: DC

## 2014-10-18 NOTE — Telephone Encounter (Signed)
OK to call it in with 65 units daily.

## 2014-10-18 NOTE — Telephone Encounter (Signed)
Pt called stating that he has to pay 2 co-pays when he gets his Levemir. The pharmacy advised him that b/c he is taking 60 units at bedtime, the amount is not enough to justify the ins to cover for the amount that he needs to have only 1 co-pay. He asked if he could get an rx for 5 more units (total of 65 units), so he will only have to pay 1 co-pay for it? Pt stated his blood sugar is running around 130's in the AM. Please advise.

## 2014-10-22 NOTE — Telephone Encounter (Signed)
Error

## 2014-10-26 ENCOUNTER — Ambulatory Visit: Payer: 59 | Admitting: Internal Medicine

## 2014-11-12 ENCOUNTER — Ambulatory Visit: Payer: 59 | Admitting: Primary Care

## 2014-11-28 ENCOUNTER — Other Ambulatory Visit: Payer: Self-pay | Admitting: Internal Medicine

## 2014-12-07 ENCOUNTER — Encounter: Payer: Self-pay | Admitting: Internal Medicine

## 2014-12-07 ENCOUNTER — Ambulatory Visit (INDEPENDENT_AMBULATORY_CARE_PROVIDER_SITE_OTHER): Payer: 59 | Admitting: Internal Medicine

## 2014-12-07 VITALS — BP 158/80 | HR 86 | Temp 97.5°F | Ht 71.0 in | Wt 267.0 lb

## 2014-12-07 DIAGNOSIS — E1142 Type 2 diabetes mellitus with diabetic polyneuropathy: Secondary | ICD-10-CM

## 2014-12-07 DIAGNOSIS — Z0000 Encounter for general adult medical examination without abnormal findings: Secondary | ICD-10-CM

## 2014-12-07 DIAGNOSIS — Z794 Long term (current) use of insulin: Secondary | ICD-10-CM

## 2014-12-07 DIAGNOSIS — N183 Chronic kidney disease, stage 3 (moderate): Secondary | ICD-10-CM | POA: Diagnosis not present

## 2014-12-07 DIAGNOSIS — E1122 Type 2 diabetes mellitus with diabetic chronic kidney disease: Secondary | ICD-10-CM

## 2014-12-07 DIAGNOSIS — IMO0002 Reserved for concepts with insufficient information to code with codable children: Secondary | ICD-10-CM

## 2014-12-07 DIAGNOSIS — E1165 Type 2 diabetes mellitus with hyperglycemia: Secondary | ICD-10-CM

## 2014-12-07 DIAGNOSIS — E785 Hyperlipidemia, unspecified: Secondary | ICD-10-CM

## 2014-12-07 DIAGNOSIS — I10 Essential (primary) hypertension: Secondary | ICD-10-CM | POA: Diagnosis not present

## 2014-12-07 LAB — CBC WITH DIFFERENTIAL/PLATELET
Basophils Absolute: 0 10*3/uL (ref 0.0–0.1)
Basophils Relative: 0.5 % (ref 0.0–3.0)
EOS PCT: 4.4 % (ref 0.0–5.0)
Eosinophils Absolute: 0.4 10*3/uL (ref 0.0–0.7)
HCT: 45.5 % (ref 39.0–52.0)
Hemoglobin: 15.1 g/dL (ref 13.0–17.0)
LYMPHS ABS: 2 10*3/uL (ref 0.7–4.0)
Lymphocytes Relative: 21.1 % (ref 12.0–46.0)
MCHC: 33.3 g/dL (ref 30.0–36.0)
MCV: 84.6 fl (ref 78.0–100.0)
MONO ABS: 0.5 10*3/uL (ref 0.1–1.0)
MONOS PCT: 5.7 % (ref 3.0–12.0)
NEUTROS ABS: 6.4 10*3/uL (ref 1.4–7.7)
NEUTROS PCT: 68.3 % (ref 43.0–77.0)
PLATELETS: 217 10*3/uL (ref 150.0–400.0)
RBC: 5.38 Mil/uL (ref 4.22–5.81)
RDW: 13.5 % (ref 11.5–15.5)
WBC: 9.3 10*3/uL (ref 4.0–10.5)

## 2014-12-07 LAB — COMPREHENSIVE METABOLIC PANEL
ALK PHOS: 95 U/L (ref 39–117)
ALT: 25 U/L (ref 0–53)
AST: 19 U/L (ref 0–37)
Albumin: 3.5 g/dL (ref 3.5–5.2)
BILIRUBIN TOTAL: 0.8 mg/dL (ref 0.2–1.2)
BUN: 26 mg/dL — AB (ref 6–23)
CO2: 30 meq/L (ref 19–32)
Calcium: 9.4 mg/dL (ref 8.4–10.5)
Chloride: 99 mEq/L (ref 96–112)
Creatinine, Ser: 1.51 mg/dL — ABNORMAL HIGH (ref 0.40–1.50)
GFR: 51.56 mL/min — AB (ref >60.00)
GLUCOSE: 328 mg/dL — AB (ref 70–99)
Potassium: 3.5 mEq/L (ref 3.5–5.1)
SODIUM: 138 meq/L (ref 135–145)
TOTAL PROTEIN: 6.3 g/dL (ref 6.0–8.3)

## 2014-12-07 LAB — HEMOGLOBIN A1C: Hgb A1c MFr Bld: 11.4 % — ABNORMAL HIGH (ref 4.6–6.5)

## 2014-12-07 LAB — LDL CHOLESTEROL, DIRECT: Direct LDL: 47 mg/dL

## 2014-12-07 LAB — LIPID PANEL
CHOLESTEROL: 227 mg/dL — AB (ref 0–200)
HDL: 27.6 mg/dL — ABNORMAL LOW (ref >39.00)
Total CHOL/HDL Ratio: 8
Triglycerides: 822 mg/dL — ABNORMAL HIGH (ref 0.0–149.0)

## 2014-12-07 LAB — HM DIABETES FOOT EXAM

## 2014-12-07 MED ORDER — SILDENAFIL CITRATE 20 MG PO TABS: 60.0000 mg | ORAL_TABLET | Freq: Every day | ORAL | Status: DC | PRN

## 2014-12-07 MED ORDER — HYDRALAZINE HCL 50 MG PO TABS: 50.0000 mg | ORAL_TABLET | Freq: Three times a day (TID) | ORAL | Status: DC
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
## 2014-12-07 NOTE — Assessment & Plan Note (Addendum)
BP Readings from Last 3 Encounters:  12/07/14 158/80  09/07/14 138/70  06/16/14 159/76   High today Plans to work on holistic program to bring down BP--diet, etc Will increase hydralazine and may do more if not down with his visits with DR Cruzita Lederer

## 2014-12-07 NOTE — Assessment & Plan Note (Signed)
Very early Just need to work on diabetic control

## 2014-12-07 NOTE — Assessment & Plan Note (Signed)
Will recheck labs 

## 2014-12-07 NOTE — Assessment & Plan Note (Signed)
Working with Dr Cruzita Lederer

## 2014-12-07 NOTE — Progress Notes (Signed)
Pre visit review using our clinic review tool, if applicable. No additional management support is needed unless otherwise documented below in the visit note. 

## 2014-12-07 NOTE — Assessment & Plan Note (Signed)
Needs to continue to work on fitness Had flu vaccine Discussed PSA--will defer to next year Colon due 1/18 due to polyps

## 2014-12-12 ENCOUNTER — Other Ambulatory Visit: Payer: Self-pay | Admitting: Internal Medicine

## 2014-12-12 ENCOUNTER — Encounter: Payer: Self-pay | Admitting: Internal Medicine

## 2014-12-12 ENCOUNTER — Ambulatory Visit (INDEPENDENT_AMBULATORY_CARE_PROVIDER_SITE_OTHER): Payer: 59 | Admitting: Internal Medicine

## 2014-12-12 VITALS — BP 138/82 | HR 71 | Temp 98.4°F | Resp 12 | Wt 275.6 lb

## 2014-12-12 DIAGNOSIS — N183 Chronic kidney disease, stage 3 (moderate): Secondary | ICD-10-CM

## 2014-12-12 DIAGNOSIS — IMO0002 Reserved for concepts with insufficient information to code with codable children: Secondary | ICD-10-CM

## 2014-12-12 DIAGNOSIS — E1165 Type 2 diabetes mellitus with hyperglycemia: Secondary | ICD-10-CM | POA: Diagnosis not present

## 2014-12-12 DIAGNOSIS — Z794 Long term (current) use of insulin: Secondary | ICD-10-CM

## 2014-12-12 DIAGNOSIS — E1122 Type 2 diabetes mellitus with diabetic chronic kidney disease: Secondary | ICD-10-CM

## 2014-12-12 NOTE — Patient Instructions (Addendum)
Please continue: - Metformin 1000 mg 2x a day - Levemir 65 units at bedtime  Increase: - Mealtime insulin NovoLog:  9 units with a smaller meal 12 with a regular meal 15-18 units with a larger meal  Please return in 1.5 month with your sugar log.

## 2014-12-12 NOTE — Progress Notes (Signed)
Subjective:     Patient ID: Samuel Reed, male   DOB: [birth year], 54 y.o.   MRN: [id number]  HPI Samuel Reed is a 54 y.o. man returning for management of DM2, dx 2003, insulin-dependent, uncontrolled, with complications (nephropathy; retinopathy OU; neuropathy). Last visit was 3 mo ago, did not return in 1 month as advised.  He is walking 2 mi 2x a week. He is also working on his diet. He is now determined to get his DM under control.  Last HbA1C: Lab Results  Component Value Date   HGBA1C 11.4* 12/07/2014   HGBA1C 13.3 09/07/2014   HGBA1C 13.0* 05/17/2014   He is on a regimen of: - Metformin 1000 mg bid - Levemir 65 units at bedtime - Mealtime insulin NovoLog - started 10/2013 - may miss the lunch insulin.  9 units with a smaller meal 12-15 with a regular meal 20 units with a larger meal We stopped Tradjenta and Glipizide XL in 10/2013.  He does not bring a sugar log (at last visit he had no checks...) >> better: - A.m.: 166, 132, 174, 386, 204, 132 >> n/c >> 117-220, 249, 283 >> 130-180, 310 (snacked at night) - before lunch: 179, 198 >> 150s - 2h after lunch: 125-225 >> n/c - Before dinner: 92, 252, 117, 213, 97 >> n/c >> 191, 195, 255 >> 130s - Nighttime: 123, 164, 116 ?? >> n/c >> 151,  205, 340 >> 150-175 He has hypoglycemia awareness, but unclear at what sugar level.  He has CKD. He is on Lisinopril 40 mg daily for BP management and microalbuminuria (MAU).  Lab Results  Component Value Date   BUN 26* 12/07/2014   CREATININE 1.51* 12/07/2014   He also has HyperTG-emia. On Crestor 20 mg daily.  Lab Results  Component Value Date   CHOL 227* 12/07/2014   HDL 27.60* 12/07/2014   LDLCALC 29 02/17/2008   LDLDIRECT 47.0 12/07/2014   TRIG * 12/07/2014    822.0 Triglyceride is over 400; calculations on Lipids are invalid.   CHOLHDL 8 12/07/2014   Last eye exam was in 09/21/2014. Improved retinopathy.  He also has OSA and is on CPAP. He has been found to have an old  MI on an EKG. Dr Rollene Fare saw him in the past for CP >> a cath was clean. He has OSA and wears CPAP every night. CP resolved after addition of CPAP.   Review of Systems  Constitutional: + weight gain, no fatigue, no subjective hyperthermia/hypothermia ENT: no sore throat, no nodules palpated in throat, no dysphagia/odynophagia, no hoarseness Cardiovascular: no CP/SOB/palpitations/ + B leg swelling Respiratory: no cough/SOB,  has a stuffy nose Gastrointestinal: no N/V/D/C Musculoskeletal: no muscle/+ joint aches Skin: no rashes Neurological: no tremors/numbness/tingling/dizziness + diff with erections  I reviewed pt's medications, allergies, PMH, social hx, family hx, and changes were documented in the history of present illness. Otherwise, unchanged from my initial visit note.  Objective:   Physical Exam BP 138/82 mmHg  Pulse 71  Temp(Src) 98.4 F (36.9 C) (Oral)  Resp 12  Wt 275 lb 9.6 oz (125.011 kg)  SpO2 95% Body mass index is 38.46 kg/(m^2).  Wt Readings from Last 3 Encounters:  12/12/14 275 lb 9.6 oz (125.011 kg)  12/07/14 267 lb (121.11 kg)  09/07/14 256 lb 12.8 oz (116.484 kg)   Constitutional: obese, in NAD Eyes: PERRLA, EOMI, no exophthalmos ENT: moist mucous membranes, no thyromegaly, no cervical lymphadenopathy Cardiovascular: RRR, No MRG, LE swelling pitting 1+ bilat.  Respiratory: CTA B Gastrointestinal: abdomen soft, NT, ND, BS+ Musculoskeletal: no deformities, strength intact in all 4 Skin: moist, warm, stasis dermatitis bilat. LE Neurological: no tremor with outstretched hands, DTR normal in all 4  Assessment:     1. DM2, insulin-dependent, uncontrolled, with complications  - nephropathy (GFR 72, MAU) - on Lisinopril - background retinopathy OU with small infarcts in each eye - no tx other than CBG control for now - [facility] [place] - neuropathy    Plan:  1. DM2 - pt with uncontrolled DM2, returning after 3 rather than 1 mo, but sugars are  better and his HbA1c checked few days ago was lower by 2%! Will need slightly higher meatime insulin.  Patient Instructions  Please continue: - Metformin 1000 mg 2x a day - Levemir 65 units at bedtime  Increase: - Mealtime insulin NovoLog:  9 units with a smaller meal 12 with a regular meal 15-18 units with a larger meal  Please return in 1.5 month with your sugar log.  - given more sugar logs - reviewed HbA1c trend: 13.3% >> 11.4% (improving) - UTD with eye exams and flu shot - Return to clinic in 1.5 month with his sugar log

## 2014-12-14 ENCOUNTER — Ambulatory Visit: Payer: 59 | Admitting: Internal Medicine

## 2015-01-01 ENCOUNTER — Other Ambulatory Visit: Payer: Self-pay | Admitting: Internal Medicine

## 2015-01-06 ENCOUNTER — Other Ambulatory Visit: Payer: Self-pay | Admitting: Internal Medicine

## 2015-01-16 ENCOUNTER — Other Ambulatory Visit: Payer: Self-pay

## 2015-01-16 MED ORDER — SILDENAFIL CITRATE 20 MG PO TABS: 60.0000 mg | ORAL_TABLET | Freq: Every day | ORAL | Status: DC | PRN

## 2015-01-16 NOTE — Telephone Encounter (Signed)
rx sent to pharmacy by e-script  

## 2015-01-16 NOTE — Telephone Encounter (Signed)
Approved:#[phone] x 11

## 2015-01-16 NOTE — Telephone Encounter (Signed)
Pt left v/m; pt has lost the sildenafil rx written in 11/2014 and pt request another rx sent to [facility].Please advise.

## 2015-02-01 ENCOUNTER — Other Ambulatory Visit: Payer: Self-pay | Admitting: Internal Medicine

## 2015-02-08 ENCOUNTER — Ambulatory Visit: Payer: 59 | Admitting: Internal Medicine

## 2015-02-11 ENCOUNTER — Other Ambulatory Visit: Payer: Self-pay | Admitting: Internal Medicine

## 2015-02-12 ENCOUNTER — Encounter (HOSPITAL_COMMUNITY)
Admission: EM | Disposition: A | Discharge status: Home / Self Care | Payer: 59 | Source: Home / Self Care | Attending: Emergency Medicine

## 2015-02-12 ENCOUNTER — Observation Stay (HOSPITAL_COMMUNITY)
Admission: EM | Admit: 2015-02-12 | Discharge: 2015-02-12 | Disposition: A | Discharge status: Home / Self Care | Payer: 59 | Attending: Cardiology | Admitting: Cardiology

## 2015-02-12 ENCOUNTER — Telehealth: Payer: Self-pay | Admitting: Internal Medicine

## 2015-02-12 ENCOUNTER — Encounter (HOSPITAL_COMMUNITY): Payer: Self-pay | Admitting: Emergency Medicine

## 2015-02-12 ENCOUNTER — Emergency Department (HOSPITAL_COMMUNITY): Payer: 59

## 2015-02-12 DIAGNOSIS — Z7982 Long term (current) use of aspirin: Secondary | ICD-10-CM | POA: Diagnosis not present

## 2015-02-12 DIAGNOSIS — E1122 Type 2 diabetes mellitus with diabetic chronic kidney disease: Secondary | ICD-10-CM | POA: Insufficient documentation

## 2015-02-12 DIAGNOSIS — Z79899 Other long term (current) drug therapy: Secondary | ICD-10-CM | POA: Insufficient documentation

## 2015-02-12 DIAGNOSIS — I2510 Atherosclerotic heart disease of native coronary artery without angina pectoris: Secondary | ICD-10-CM | POA: Diagnosis not present

## 2015-02-12 DIAGNOSIS — Z7951 Long term (current) use of inhaled steroids: Secondary | ICD-10-CM | POA: Diagnosis not present

## 2015-02-12 DIAGNOSIS — Z794 Long term (current) use of insulin: Secondary | ICD-10-CM | POA: Diagnosis not present

## 2015-02-12 DIAGNOSIS — I2584 Coronary atherosclerosis due to calcified coronary lesion: Secondary | ICD-10-CM | POA: Insufficient documentation

## 2015-02-12 DIAGNOSIS — R079 Chest pain, unspecified: Secondary | ICD-10-CM | POA: Diagnosis present

## 2015-02-12 DIAGNOSIS — E1165 Type 2 diabetes mellitus with hyperglycemia: Secondary | ICD-10-CM | POA: Insufficient documentation

## 2015-02-12 DIAGNOSIS — E782 Mixed hyperlipidemia: Secondary | ICD-10-CM | POA: Insufficient documentation

## 2015-02-12 DIAGNOSIS — I129 Hypertensive chronic kidney disease with stage 1 through stage 4 chronic kidney disease, or unspecified chronic kidney disease: Secondary | ICD-10-CM | POA: Insufficient documentation

## 2015-02-12 DIAGNOSIS — I872 Venous insufficiency (chronic) (peripheral): Secondary | ICD-10-CM | POA: Diagnosis not present

## 2015-02-12 DIAGNOSIS — E668 Other obesity: Secondary | ICD-10-CM | POA: Diagnosis not present

## 2015-02-12 DIAGNOSIS — N183 Chronic kidney disease, stage 3 (moderate): Secondary | ICD-10-CM | POA: Insufficient documentation

## 2015-02-12 DIAGNOSIS — I252 Old myocardial infarction: Secondary | ICD-10-CM | POA: Insufficient documentation

## 2015-02-12 DIAGNOSIS — G4733 Obstructive sleep apnea (adult) (pediatric): Secondary | ICD-10-CM | POA: Insufficient documentation

## 2015-02-12 DIAGNOSIS — IMO0002 Reserved for concepts with insufficient information to code with codable children: Secondary | ICD-10-CM

## 2015-02-12 DIAGNOSIS — R0789 Other chest pain: Secondary | ICD-10-CM | POA: Diagnosis not present

## 2015-02-12 HISTORY — PX: CARDIAC CATHETERIZATION: SHX172

## 2015-02-12 LAB — I-STAT TROPONIN, ED
TROPONIN I, POC: 0.03 ng/mL (ref 0.00–0.08)
Troponin i, poc: 0.01 ng/mL (ref 0.00–0.08)

## 2015-02-12 LAB — BASIC METABOLIC PANEL
Anion gap: 11 (ref 5–15)
BUN: 29 mg/dL — AB (ref 6–20)
CHLORIDE: 103 mmol/L (ref 101–111)
CO2: 23 mmol/L (ref 22–32)
CREATININE: 1.81 mg/dL — AB (ref 0.61–1.24)
Calcium: 9.2 mg/dL (ref 8.9–10.3)
GFR calc Af Amer: 48 mL/min — ABNORMAL LOW (ref >60)
GFR calc non Af Amer: 41 mL/min — ABNORMAL LOW (ref >60)
Glucose, Bld: 484 mg/dL — ABNORMAL HIGH (ref 65–99)
Potassium: 3.9 mmol/L (ref 3.5–5.1)
SODIUM: 137 mmol/L (ref 135–145)

## 2015-02-12 LAB — GLUCOSE, CAPILLARY: Glucose-Capillary: 339 mg/dL — ABNORMAL HIGH (ref 65–99)

## 2015-02-12 LAB — CBC
HCT: 41.9 % (ref 39.0–52.0)
Hemoglobin: 14.9 g/dL (ref 13.0–17.0)
MCH: 28.9 pg (ref 26.0–34.0)
MCHC: 35.6 g/dL (ref 30.0–36.0)
MCV: 81.4 fL (ref 78.0–100.0)
PLATELETS: 198 10*3/uL (ref 150–400)
RBC: 5.15 MIL/uL (ref 4.22–5.81)
RDW: 13 % (ref 11.5–15.5)
WBC: 8.1 10*3/uL (ref 4.0–10.5)

## 2015-02-12 LAB — PROTIME-INR
INR: 1.02 (ref 0.00–1.49)
PROTHROMBIN TIME: 13.6 s (ref 11.6–15.2)

## 2015-02-12 SURGERY — LEFT HEART CATH AND CORONARY ANGIOGRAPHY
Anesthesia: LOCAL

## 2015-02-12 MED ORDER — AMLODIPINE BESYLATE 5 MG PO TABS: 10.0000 mg | ORAL_TABLET | Freq: Every day | ORAL | Status: DC

## 2015-02-12 MED ORDER — MIDAZOLAM HCL 2 MG/2ML IJ SOLN
INTRAMUSCULAR | Status: DC | PRN
  Administered 2015-02-12: 2 mg via INTRAVENOUS

## 2015-02-12 MED ORDER — IOHEXOL 350 MG/ML SOLN
INTRAVENOUS | Status: DC | PRN
  Administered 2015-02-12: 30 mL via INTRA_ARTICULAR

## 2015-02-12 MED ORDER — HEPARIN (PORCINE) IN NACL 2-0.9 UNIT/ML-% IJ SOLN
INTRAMUSCULAR | Status: AC
  Filled 2015-02-12: qty 1000

## 2015-02-12 MED ORDER — SODIUM CHLORIDE 0.9 % IJ SOLN
3.0000 mL | Freq: Two times a day (BID) | INTRAMUSCULAR | Status: DC
Start: 2015-02-12 — End: 2015-02-12

## 2015-02-12 MED ORDER — METFORMIN HCL 1000 MG PO TABS: ORAL_TABLET | ORAL | Status: DC

## 2015-02-12 MED ORDER — INSULIN DETEMIR 100 UNIT/ML ~~LOC~~ SOLN: 60.0000 [IU] | Freq: Every day | SUBCUTANEOUS | Status: DC

## 2015-02-12 MED ORDER — INSULIN ASPART 100 UNIT/ML FLEXPEN: 12.0000 [IU] | PEN_INJECTOR | Freq: Three times a day (TID) | SUBCUTANEOUS | Status: DC

## 2015-02-12 MED ORDER — HYDRALAZINE HCL 20 MG/ML IJ SOLN: 10.0000 mg | Freq: Four times a day (QID) | INTRAMUSCULAR | Status: DC | PRN

## 2015-02-12 MED ORDER — LABETALOL HCL 5 MG/ML IV SOLN
INTRAVENOUS | Status: AC
  Filled 2015-02-12: qty 4

## 2015-02-12 MED ORDER — SODIUM CHLORIDE 0.9 % WEIGHT BASED INFUSION: 3.0000 mL/kg/h | INTRAVENOUS | Status: DC

## 2015-02-12 MED ORDER — LABETALOL HCL 5 MG/ML IV SOLN
10.0000 mg | Freq: Once | INTRAVENOUS | Status: AC
  Administered 2015-02-12: 10 mg via INTRAVENOUS

## 2015-02-12 MED ORDER — HEPARIN SODIUM (PORCINE) 1000 UNIT/ML IJ SOLN
INTRAMUSCULAR | Status: DC | PRN
  Administered 2015-02-12: 8000 [IU] via INTRAVENOUS

## 2015-02-12 MED ORDER — SODIUM CHLORIDE 0.9 % IV BOLUS (SEPSIS)
1000.0000 mL | Freq: Once | INTRAVENOUS | Status: AC
  Administered 2015-02-12: 1000 mL via INTRAVENOUS

## 2015-02-12 MED ORDER — OMEPRAZOLE 40 MG PO CPDR: 40.0000 mg | DELAYED_RELEASE_CAPSULE | Freq: Every day | ORAL | Status: DC

## 2015-02-12 MED ORDER — FLUTICASONE PROPIONATE 50 MCG/ACT NA SUSP: 2.0000 | Freq: Every day | NASAL | Status: DC

## 2015-02-12 MED ORDER — SODIUM CHLORIDE 0.9 % IV SOLN: 250.0000 mL | INTRAVENOUS | Status: DC | PRN

## 2015-02-12 MED ORDER — HEPARIN BOLUS VIA INFUSION
4000.0000 [IU] | Freq: Once | INTRAVENOUS | Status: AC
  Administered 2015-02-12: 4000 [IU] via INTRAVENOUS
  Filled 2015-02-12: qty 4000

## 2015-02-12 MED ORDER — HEPARIN SODIUM (PORCINE) 1000 UNIT/ML IJ SOLN
INTRAMUSCULAR | Status: AC
  Filled 2015-02-12: qty 1

## 2015-02-12 MED ORDER — METOPROLOL TARTRATE 25 MG PO TABS: 100.0000 mg | ORAL_TABLET | Freq: Two times a day (BID) | ORAL | Status: DC

## 2015-02-12 MED ORDER — VERAPAMIL HCL 2.5 MG/ML IV SOLN
INTRA_ARTERIAL | Status: DC | PRN
  Administered 2015-02-12: 17:00:00 via INTRA_ARTERIAL

## 2015-02-12 MED ORDER — NITROGLYCERIN 0.4 MG SL SUBL
0.4000 mg | SUBLINGUAL_TABLET | SUBLINGUAL | Status: DC | PRN
  Administered 2015-02-12 (×2): 0.4 mg via SUBLINGUAL
  Filled 2015-02-12: qty 1

## 2015-02-12 MED ORDER — HYDRALAZINE HCL 25 MG PO TABS
50.0000 mg | ORAL_TABLET | Freq: Three times a day (TID) | ORAL | Status: DC
Start: 2015-02-12 — End: 2015-02-12

## 2015-02-12 MED ORDER — AMLODIPINE BESYLATE 5 MG PO TABS
ORAL_TABLET | ORAL | Status: AC
Start: 2015-02-12 — End: 2015-02-12
  Administered 2015-02-12: 5 mg
  Filled 2015-02-12: qty 2

## 2015-02-12 MED ORDER — MIDAZOLAM HCL 2 MG/2ML IJ SOLN
INTRAMUSCULAR | Status: AC
  Filled 2015-02-12: qty 2

## 2015-02-12 MED ORDER — HEPARIN (PORCINE) IN NACL 100-0.45 UNIT/ML-% IJ SOLN
1400.0000 [IU]/h | INTRAMUSCULAR | Status: DC
  Administered 2015-02-12: 1400 [IU]/h via INTRAVENOUS
  Filled 2015-02-12: qty 250

## 2015-02-12 MED ORDER — ASPIRIN EC 325 MG PO TBEC: 325.0000 mg | DELAYED_RELEASE_TABLET | Freq: Every day | ORAL | Status: DC

## 2015-02-12 MED ORDER — SODIUM CHLORIDE 0.9 % IJ SOLN: 3.0000 mL | INTRAMUSCULAR | Status: DC | PRN

## 2015-02-12 MED ORDER — AMLODIPINE BESYLATE 10 MG PO TABS: 10.0000 mg | ORAL_TABLET | Freq: Every day | ORAL | Status: DC

## 2015-02-12 MED ORDER — FENTANYL CITRATE (PF) 100 MCG/2ML IJ SOLN
INTRAMUSCULAR | Status: DC | PRN
  Administered 2015-02-12: 50 ug via INTRAVENOUS

## 2015-02-12 MED ORDER — DOXAZOSIN MESYLATE 4 MG PO TABS: 4.0000 mg | ORAL_TABLET | Freq: Every day | ORAL | Status: DC

## 2015-02-12 MED ORDER — LISINOPRIL-HYDROCHLOROTHIAZIDE 20-12.5 MG PO TABS: 2.0000 | ORAL_TABLET | Freq: Every day | ORAL | Status: DC

## 2015-02-12 MED ORDER — LIDOCAINE HCL (PF) 1 % IJ SOLN
INTRAMUSCULAR | Status: AC
  Filled 2015-02-12: qty 30

## 2015-02-12 MED ORDER — GLUCOSE BLOOD VI STRP: 1.0000 | ORAL_STRIP | Status: DC | PRN

## 2015-02-12 MED ORDER — FENTANYL CITRATE (PF) 100 MCG/2ML IJ SOLN
INTRAMUSCULAR | Status: AC
  Filled 2015-02-12: qty 2

## 2015-02-12 MED ORDER — NITROGLYCERIN IN D5W 200-5 MCG/ML-% IV SOLN
5.0000 ug/min | Freq: Once | INTRAVENOUS | Status: AC
  Administered 2015-02-12: 5 ug/min via INTRAVENOUS
  Filled 2015-02-12: qty 250

## 2015-02-12 MED ORDER — SODIUM CHLORIDE 0.9 % WEIGHT BASED INFUSION: 1.0000 mL/kg/h | INTRAVENOUS | Status: DC

## 2015-02-12 MED ORDER — ROSUVASTATIN CALCIUM 20 MG PO TABS: 20.0000 mg | ORAL_TABLET | Freq: Every day | ORAL | Status: DC

## 2015-02-12 MED ORDER — METFORMIN HCL 500 MG PO TABS: 1000.0000 mg | ORAL_TABLET | Freq: Two times a day (BID) | ORAL | Status: DC

## 2015-02-12 SURGICAL SUPPLY — 8 items
CATH OPTITORQUE TIG 4.0 5F (CATHETERS) ×2 IMPLANT
DEVICE RAD COMP TR BAND LRG (VASCULAR PRODUCTS) ×2 IMPLANT
GLIDESHEATH SLEND A-KIT 6F 20G (SHEATH) ×2 IMPLANT
KIT HEART LEFT (KITS) ×2 IMPLANT
PACK CARDIAC CATHETERIZATION (CUSTOM PROCEDURE TRAY) ×2 IMPLANT
TRANSDUCER W/STOPCOCK (MISCELLANEOUS) ×2 IMPLANT
TUBING CIL FLEX 10 FLL-RA (TUBING) ×2 IMPLANT
WIRE SAFE-T 1.5MM-J .035X260CM (WIRE) ×2 IMPLANT

## 2015-02-12 NOTE — Interval H&P Note (Signed)
History and Physical Interval Note:  02/12/2015 4:14 PM  Samuel Reed  has presented today for surgery, with the diagnosis of CP  The various methods of treatment have been discussed with the patient and family. After consideration of risks, benefits and other options for treatment, the patient has consented to  Procedure(s): Left Heart Cath and Coronary Angiography (N/A) and possible PCI as a surgical intervention .  The patient's history has been reviewed, patient examined, no change in status, stable for surgery.  I have reviewed the patient's chart and labs.  Questions were answered to the patient's satisfaction.   Cath Lab Visit (complete for each Cath Lab visit)  Clinical Evaluation Leading to the Procedure:   ACS: Yes.    Non-ACS:    Anginal Classification: CCS IV  Anti-ischemic medical therapy: Maximal Therapy (2 or more classes of medications)  Non-Invasive Test Results: No non-invasive testing performed  Prior CABG: No previous CABG        Adrian Prows
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
## 2015-02-12 NOTE — ED Notes (Signed)
Pt requesting to hold off on 3rd NTG, PA aware.

## 2015-02-12 NOTE — ED Notes (Signed)
Cards at bedside, to keep pt NPO, possible cath this evening.

## 2015-02-12 NOTE — H&P (Signed)
Samuel Reed is an 55 y.o. male.   Chief Complaint: Chest pain HPI: Samuel Reed  is a 55 y.o. male with a history of MI in 2005, states he had a cath at that time and no stents were placed. He also has a history of uncontrolled diabetes, last HbA1c >11%, HTN, and mixed hyperlipidemia. He states he woke this morning around 4:30am with midsternal chest pain with radiation to the right chest. He states this felt similar to the pain he had when he presented in 2005. He has continued to have symptoms over the years but nothing as severe as this morning. He took a Administrator and symptoms improved so he went to work. While there, he had to walk from one building to another and chest pain became significantly worse and was then associated with shortness of breath so he came to the ED. Ntg gtt was started with almost immediate relief in chest pain.   Past Medical History  Diagnosis Date  . Diabetes mellitus type II 2003  . HLD (hyperlipidemia)   . HTN (hypertension) 1980's  . Chronic venous insufficiency   . OSA (obstructive sleep apnea)     Past Surgical History  Procedure Laterality Date  . Lumbar disc surgery  1986  . "bone removal" from back  1987  . Knee cartilage surgery      left knee  . Cardiac catheterization      Family History  Problem Relation Age of Onset  . COPD Father   . Cancer Father     prostate  . Prostate cancer Father   . Diabetes Mother   . Coronary artery disease Paternal Grandfather    Social History:  reports that he has never smoked. He has never used smokeless tobacco. He reports that he does not drink alcohol or use illicit drugs.  Allergies: No Known Allergies  Review of Systems - History obtained from the patient General ROS: negative for - chills, fatigue or fever Respiratory ROS: positive for - shortness of breath negative for - cough, hemoptysis or orthopnea Cardiovascular ROS: positive for - chest pain and edema negative for - palpitations or rapid  heart rate Neurological ROS: no TIA or stroke symptoms  Blood pressure 167/74, pulse 78, temperature 97.9 F (36.6 C), temperature source Oral, resp. rate 15, weight 125 kg (275 lb 9.2 oz), SpO2 98 %.   General appearance: alert, cooperative, appears stated age and moderately obese Eyes: negative Neck: no adenopathy, no carotid bruit, no JVD, supple, symmetrical, trachea midline and thyroid not enlarged, symmetric, no tenderness/mass/nodules Resp: clear to auscultation bilaterally Chest wall: no tenderness Cardio: regular rate and rhythm, S1, S2 normal, no murmur, click, rub or gallop GI: soft, non-tender; bowel sounds normal; no masses,  no organomegaly Extremities: edema 2+ bilaterally, chronic Pulses: 2+ and symmetric Skin: Skin color, texture, turgor normal. No rashes or lesions Neurologic: Grossly normal  Results for orders placed or performed during the hospital encounter of 02/12/15 (from the past 48 hour(s))  Basic metabolic panel     Status: Abnormal   Collection Time: 02/12/15 10:04 AM  Result Value Ref Range   Sodium 137 135 - 145 mmol/L   Potassium 3.9 3.5 - 5.1 mmol/L   Chloride 103 101 - 111 mmol/L   CO2 23 22 - 32 mmol/L   Glucose, Bld 484 (H) 65 - 99 mg/dL   BUN 29 (H) 6 - 20 mg/dL   Creatinine, Ser 1.81 (H) 0.61 - 1.24 mg/dL  Calcium 9.2 8.9 - 10.3 mg/dL   GFR calc non Af Amer 41 (L) >60 mL/min   GFR calc Af Amer 48 (L) >60 mL/min    Comment: (NOTE) The eGFR has been calculated using the CKD EPI equation. This calculation has not been validated in all clinical situations. eGFR's persistently <60 mL/min signify possible Chronic Kidney Disease.    Anion gap 11 5 - 15  CBC     Status: None   Collection Time: 02/12/15 10:04 AM  Result Value Ref Range   WBC 8.1 4.0 - 10.5 K/uL   RBC 5.15 4.22 - 5.81 MIL/uL   Hemoglobin 14.9 13.0 - 17.0 g/dL   HCT 41.9 39.0 - 52.0 %   MCV 81.4 78.0 - 100.0 fL   MCH 28.9 26.0 - 34.0 pg   MCHC 35.6 30.0 - 36.0 g/dL   RDW  13.0 11.5 - 15.5 %   Platelets 198 150 - 400 K/uL  I-stat troponin, ED (not at [facility])     Status: None   Collection Time: 02/12/15 10:12 AM  Result Value Ref Range   Troponin i, poc 0.03 0.00 - 0.08 ng/mL   Comment 3            Comment: Due to the release kinetics of cTnI, a negative result within the first hours of the onset of symptoms does not rule out myocardial infarction with certainty. If myocardial infarction is still suspected, repeat the test at appropriate intervals.    Dg Chest 2 View  02/12/2015  CLINICAL DATA:  Right side chest pain and shortness of breath for 6 hours, history of diabetes EXAM: CHEST  2 VIEW COMPARISON:  10/27/2007 FINDINGS: Cardiomediastinal silhouette is unremarkable. No acute infiltrate or pleural effusion. No pulmonary edema. Mild degenerative changes thoracic spine. IMPRESSION: No active cardiopulmonary disease. Mild degenerative changes thoracic spine. Electronically Signed   By: Lahoma Crocker M.D.   On: 02/12/2015 10:44    Labs:   Lab Results  Component Value Date   WBC 8.1 02/12/2015   HGB 14.9 02/12/2015   HCT 41.9 02/12/2015   MCV 81.4 02/12/2015   PLT 198 02/12/2015    Recent Labs Lab 02/12/15 1004  NA 137  K 3.9  CL 103  CO2 23  BUN 29*  CREATININE 1.81*  CALCIUM 9.2  GLUCOSE 484*    Lipid Panel     Component Value Date/Time   CHOL 227* 12/07/2014 1123   TRIG * 12/07/2014 1123    822.0 Triglyceride is over 400; calculations on Lipids are invalid.   HDL 27.60* 12/07/2014 1123   CHOLHDL 8 12/07/2014 1123   VLDL 130.2* 10/18/2013 1056   LDLCALC 29 02/17/2008 1320    BNP (last 3 results) No results for input(s): BNP in the last 8760 hours.  HEMOGLOBIN A1C Lab Results  Component Value Date   HGBA1C 11.4* 12/07/2014   MPG 246* 01/22/2012    Cardiac Panel (last 3 results) No results for input(s): CKTOTAL, CKMB, TROPONINI, RELINDX in the last 8760 hours.  Lab Results  Component Value Date   CKTOTAL 69 10/28/2007    CKMB 1.5 10/28/2007   TROPONINI 0.02        NO INDICATION OF MYOCARDIAL INJURY. 10/28/2007     TSH No results for input(s): TSH in the last 8760 hours.  EKG 02/12/2015: SR with 1st degree AV block at a rate of 70bpm, LAD, LAFB, PRWP. No change from prior EKG.   (Not in a hospital admission)  Current facility-administered medications:  .  amLODipine (NORVASC) tablet 10 mg, 10 mg, Oral, Daily, Larene Pickett, PA-C .  aspirin EC tablet 325 mg, 325 mg, Oral, Daily, Larene Pickett, PA-C .  doxazosin (CARDURA) tablet 4 mg, 4 mg, Oral, QHS, Larene Pickett, PA-C .  fluticasone [facility]) 50 MCG/ACT nasal spray 2 spray, 2 spray, Each Nare, Daily, Larene Pickett, PA-C .  glucose blood test strip STRP 1 each, 1 each, Other, PRN, Larene Pickett, PA-C .  heparin ADULT infusion 100 units/mL (25000 units/250 mL), 1,400 Units/hr, Intravenous, Continuous, Lauren D Bajbus, RPH .  heparin bolus via infusion 4,000 Units, 4,000 Units, Intravenous, Once, Lauren D Bajbus, RPH .  hydrALAZINE (APRESOLINE) tablet 50 mg, 50 mg, Oral, TID, Larene Pickett, PA-C .  insulin aspart (NOVOLOG) FlexPen 12-20 Units, 12-20 Units, Subcutaneous, TID WC, Larene Pickett, PA-C .  insulin detemir (LEVEMIR) injection 60 Units, 60 Units, Subcutaneous, QHS, Larene Pickett, PA-C .  lisinopril-hydrochlorothiazide (PRINZIDE,ZESTORETIC) 20-12.5 MG per tablet 2 tablet, 2 tablet, Oral, Daily, Larene Pickett, PA-C .  metFORMIN (GLUCOPHAGE) tablet 1,000 mg, 1,000 mg, Oral, BID WC, Larene Pickett, PA-C .  metoprolol tartrate (LOPRESSOR) tablet 100 mg, 100 mg, Oral, BID, Larene Pickett, PA-C .  nitroGLYCERIN (NITROSTAT) SL tablet 0.4 mg, 0.4 mg, Sublingual, Q5 Min x 3 PRN, Larene Pickett, PA-C, 0.4 mg at 02/12/15 1316 .  rosuvastatin (CRESTOR) tablet 20 mg, 20 mg, Oral, Daily, Larene Pickett, PA-C  Current outpatient prescriptions:  .  amLODipine (NORVASC) 10 MG tablet, TAKE 1 TABLET (10 MG TOTAL) BY MOUTH DAILY., Disp: 90 tablet,  Rfl: 3 .  aspirin 325 MG EC tablet, Take 325 mg by mouth daily., Disp: , Rfl:  .  CRESTOR 20 MG tablet, TAKE 1 BY MOUTH ONCE DAILY, Disp: 30 tablet, Rfl: 5 .  doxazosin (CARDURA) 4 MG tablet, TAKE 1 TABLET (4 MG TOTAL) BY MOUTH AT BEDTIME., Disp: 90 tablet, Rfl: 3 .  fluticasone (FLONASE) 50 MCG/ACT nasal spray, USE 2 SPRAYS IN EACH NOSTRIL 2 TIMES A DAY, Disp: 32 g, Rfl: 3 .  hydrALAZINE (APRESOLINE) 50 MG tablet, Take 1 tablet (50 mg total) by mouth 3 (three) times daily., Disp: 90 tablet, Rfl: 11 .  LEVEMIR 100 UNIT/ML injection, INJECT 0.6 MLS (60 UNITS TOTAL) INTO THE SKIN AT BEDTIME., Disp: 20 mL, Rfl: 1 .  lisinopril-hydrochlorothiazide (PRINZIDE,ZESTORETIC) 20-12.5 MG per tablet, TAKE 2 TABLETS BY MOUTH EVERY DAY, Disp: 60 tablet, Rfl: 11 .  metFORMIN (GLUCOPHAGE) 1000 MG tablet, TAKE 1 TABLET (1,000 MG TOTAL) BY MOUTH 2 (TWO) TIMES DAILY WITH A MEAL., Disp: 60 tablet, Rfl: 2 .  metoprolol (LOPRESSOR) 100 MG tablet, TAKE 1 TABLET (100 MG TOTAL) BY MOUTH 2 (TWO) TIMES DAILY., Disp: 180 tablet, Rfl: 3 .  NOVOLOG FLEXPEN 100 UNIT/ML FlexPen, INJECT 12-20 UNITS INTO THE SKIN 3 (THREE) TIMES DAILY WITH MEALS., Disp: 15 pen, Rfl: 0 .  sildenafil (REVATIO) 20 MG tablet, Take 3-5 tablets (60-100 mg total) by mouth daily as needed., Disp: 50 tablet, Rfl: 11 .  ACCU-CHEK SOFTCLIX LANCETS lancets, Use to test blood sugar 2 times daily as instructed. Dx code: 20.42, Disp: 100 each, Rfl: 10 .  glucose blood (ACCU-CHEK AVIVA PLUS) test strip, Use to test blood sugar 3 times daily as instructed. Dx code: 80.42, Disp: 300 each, Rfl: 3 .  Insulin Pen Needle 32G X 4 MM MISC, Use 3x a day, Disp: 100 each, Rfl: 11 .  Insulin  Syringe-Needle U-100 (B-D INS SYR ULTRAFINE 1CC/30G) 30G X 1/2" 1 ML MISC, Use to inject insulin once daily dx: E11.29, Disp: 100 each, Rfl: 3    Assessment/Plan 1. Chest Pain 2. Uncontrolled Diabetes, Type II, on insulin 3. Mixed Hyperlipidemia 4. Benign Essential Hypertension 5.  OSA on CPAP  Recommendation: Symptoms suggestive of [place] and is nitrate responsive in a very high risk pt with uncontrolled DM, accelerated hypertension, and mixed hyperlipidemia. Schedule for coronary angiogram and possible angioplasty. We discussed regarding risks, benefits, alternatives to this including stress testing, CTA and continued medical therapy. Patient wants to proceed. Understands <1-2% risk of death, stroke, MI, urgent CABG, bleeding, infection, renal failure but not limited to these. He took 3107m ASA this morning. Heparin gtt and Ntg gtt initiated.  ARachel Bo NP-C 02/12/2015, 2:39 PM [facility] Pager: [phone]: [phone]

## 2015-02-12 NOTE — ED Notes (Signed)
2 RN unable to obtain 2nd IV, NP at bedside attempting

## 2015-02-12 NOTE — Telephone Encounter (Signed)
Patient Name: Samuel Reed  DOB: Jan 12, 1961    Initial Comment caller states he is having right chest pain and right neck pain - had a EKG at the clinic at his work site - was told he wasnt having a heart attack but they are sending him home - states he had a heart attack in [place]: [name], RN, Alveta Heimlich Date/Time (Eastern Time): 02/12/2015 9:17:52 AM  Confirm and document reason for call. If symptomatic, describe symptoms. You must click the next button to save text entered. ---Caller states that he has chest pressure achy pain in his right chest area which began this AM. He has this on occasions. They did an EKG at work and was told it was not a heart attack. The pain goes up into his neck. This leaves him tired. He still has the pain at this time. Denies difficulty breathing.  Has the patient traveled out of the country within the last 30 days? ---No  Does the patient have any new or worsening symptoms? ---Yes  Will a triage be completed? ---Yes  Related visit to physician within the last 2 weeks? ---No  Does the PT have any chronic conditions? (i.e. diabetes, asthma, etc.) ---Yes  List chronic conditions. ---Diabetes, HTN  Is this a behavioral health or substance abuse call? ---No     Guidelines    Guideline Title Affirmed Question Affirmed Notes  Chest Pain [1] Chest pain lasts > 5 minutes AND [2] history of heart disease (i.e., heart attack, bypass surgery, angina, angioplasty, CHF; not just a heart murmur)    Final Disposition User   Call EMS 911 Now [name], RN, [place] states that he does not want to call 911, he wants to go to see the doctor instead. Advised him that with his symptoms and his prior history of MI with HTN, I have to recommend he call 911. Over the phone, I cannot eliminate anything. Its up to him if he wants to call them or not. He does not. I advised him that someone from the office will be contacting him. He verbalized understanding.   I called the backline and gave the information to [place]. She transferred me to [facility]. I provided her with the information including contact info. She states that they do not have any appointments available, but she will call him.   Disagree/Comply: Disagree  Disagree/Comply Reason: Disagree with instructions

## 2015-02-12 NOTE — Telephone Encounter (Signed)
I will check on his records at the ER

## 2015-02-12 NOTE — Progress Notes (Signed)
ANTICOAGULATION CONSULT NOTE - Initial Consult  Pharmacy Consult for heparin Indication: chest pain/ACS  No Known Allergies  Patient Measurements: Weight: 275 lb 9.2 oz (125 kg) Heparin Dosing Weight: 104kg  Vital Signs: Temp: 97.9 F (36.6 C) (01/17 0959) Temp Source: Oral (01/17 0959) BP: 160/83 mmHg (01/17 1415) Pulse Rate: 67 (01/17 1415)  Labs:  Recent Labs  02/12/15 1004  HGB 14.9  HCT 41.9  PLT 198  CREATININE 1.81*    Estimated Creatinine Clearance: 63.6 mL/min (by C-G formula based on Cr of 1.81).   Medical History: Past Medical History  Diagnosis Date  . Diabetes mellitus type II 2003  . HLD (hyperlipidemia)   . HTN (hypertension) 1980's  . Chronic venous insufficiency   . OSA (obstructive sleep apnea)     Assessment: 30 YOM with history of CAD who had CP at work- EKG done at work did not show ST changes. Pt refused to call 911- boss brought him to ED. To start IV heparin for ACS. He is not on anticoagulation PTA. Baseline Hgb 14.9, plts 198- no bleeding noted.   Goal of Therapy:  Heparin level 0.3-0.7 units/ml Monitor platelets by anticoagulation protocol: Yes   Plan:  -heparin bolus with 4000 units IV x1, then start infusion at 1400 units/hr -first level in 6h -daily HL and CBC -follow cards plans -follow s/s bleeding  Keyairra Kolinski D. Joliyah Lippens, PharmD, BCPS Clinical Pharmacist Pager: [phone] 02/12/2015 2:28 PM

## 2015-02-12 NOTE — ED Notes (Signed)
Pt sts right sided CP with radiation into neck starting this am

## 2015-02-12 NOTE — Discharge Instructions (Signed)
Radial Site Care °Refer to this sheet in the next few weeks. These instructions provide you with information about caring for yourself after your procedure. Your health care provider may also give you more specific instructions. Your treatment has been planned according to current medical practices, but problems sometimes occur. Call your health care provider if you have any problems or questions after your procedure. °WHAT TO EXPECT AFTER THE PROCEDURE °After your procedure, it is typical to have the following: °· Bruising at the radial site that usually fades within 1-2 weeks. °· Blood collecting in the tissue (hematoma) that may be painful to the touch. It should usually decrease in size and tenderness within 1-2 weeks. °HOME CARE INSTRUCTIONS °· Take medicines only as directed by your health care provider. °· You may shower 24-48 hours after the procedure or as directed by your health care provider. Remove the bandage (dressing) and gently wash the site with plain soap and water. Pat the area dry with a clean towel. Do not rub the site, because this may cause bleeding. °· Do not take baths, swim, or use a hot tub until your health care provider approves. °· Check your insertion site every day for redness, swelling, or drainage. °· Do not apply powder or lotion to the site. °· Do not flex or bend the affected arm for 24 hours or as directed by your health care provider. °· Do not push or pull heavy objects with the affected arm for 24 hours or as directed by your health care provider. °· Do not lift over 10 lb (4.5 kg) for 5 days after your procedure or as directed by your health care provider. °· Ask your health care provider when it is okay to: °¨ Return to work or school. °¨ Resume usual physical activities or sports. °¨ Resume sexual activity. °· Do not drive home if you are discharged the same day as the procedure. Have someone else drive you. °· You may drive 24 hours after the procedure unless otherwise  instructed by your health care provider. °· Do not operate machinery or power tools for 24 hours after the procedure. °· If your procedure was done as an outpatient procedure, which means that you went home the same day as your procedure, a responsible adult should be with you for the first 24 hours after you arrive home. °· Keep all follow-up visits as directed by your health care provider. This is important. °SEEK MEDICAL CARE IF: °· You have a fever. °· You have chills. °· You have increased bleeding from the radial site. Hold pressure on the site. CALL 911 °SEEK IMMEDIATE MEDICAL CARE IF: °· You have unusual pain at the radial site. °· You have redness, warmth, or swelling at the radial site. °· You have drainage (other than a small amount of blood on the dressing) from the radial site. °· The radial site is bleeding, and the bleeding does not stop after 30 minutes of holding steady pressure on the site. °· Your arm or hand becomes pale, cool, tingly, or numb. °  °This information is not intended to replace advice given to you by your health care provider. Make sure you discuss any questions you have with your health care provider. °  °Document Released: 02/14/2010 Document Revised: 02/02/2014 Document Reviewed: 07/31/2013 °Elsevier Interactive Patient Education ©2016 [employer] ° °

## 2015-02-12 NOTE — Telephone Encounter (Signed)
Spoke with pt; presently pt is feeling worse and he will not call 911 but pts supervisor is taking him to [facility] ED now.

## 2015-02-12 NOTE — Discharge Summary (Signed)
Physician Discharge Summary  Patient ID: Samuel Reed MRN: [id number] DOB/AGE: 1960-04-01 55 y.o.  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 Normal LVEF.  Hospital Course: Patient was admitted this afternoon with chest pain radiation to the neck, in 2005 he has had coronary angiography similar presentation and was found to have no significant CAD. He kept having chest discomfort which is relieved with nitroglycerin, due to his multiple cardio vascular risk factors it is best felt that he should be directly taken to the cardiac catheterization lab to invalid coronary anatomy. He underwent coronary angiography with 30 mL of contrast, no significant CAD, no ulcerated lesions or thrombus was evident. Hence felt that the chest pain was probably noncardiac, probably related to either esophageal spasm or GERD hence felt stable for discharge.   Recommendations on discharge: I have started the patient on omeprazole 40 mg by mouth every before meals daily. I will see back in the office in 2 weeks.  Discharge Exam: Blood pressure 167/87, pulse 87, temperature 97.9 F (36.6 C), temperature source Oral, resp. rate 18, weight 125 kg (275 lb 9.2 oz), SpO2 0 %.  General appearance: alert, cooperative, appears stated age and moderately obese Eyes: negative Neck: no adenopathy, no carotid bruit, no JVD, supple, symmetrical, trachea midline and thyroid not enlarged, symmetric, no tenderness/mass/nodules Resp: clear to auscultation bilaterally Chest wall: no tenderness Cardio: regular rate and rhythm, S1, S2 normal, no murmur, click, rub or gallop GI: soft,  non-tender; bowel sounds normal; no masses, no organomegaly Extremities: edema 2+ bilaterally, chronic Pulses: 2+ and symmetric Skin: Skin color, texture, turgor normal. No rashes or lesions Neurologic: Grossly normal  Labs:   Lab Results  Component Value Date   WBC 8.1 02/12/2015   HGB 14.9 02/12/2015   HCT 41.9 02/12/2015   MCV 81.4 02/12/2015   PLT 198 02/12/2015    Recent Labs Lab 02/12/15 1004  NA 137  K 3.9  CL 103  CO2 23  BUN 29*  CREATININE 1.81*  CALCIUM 9.2  GLUCOSE 484*    Lipid Panel     Component Value Date/Time   CHOL 227* 12/07/2014 1123   TRIG * 12/07/2014 1123    822.0 Triglyceride is over 400; calculations on Lipids are invalid.   HDL 27.60* 12/07/2014 1123   CHOLHDL 8 12/07/2014 1123   VLDL 130.2* 10/18/2013 1056   LDLCALC 29 02/17/2008 1320    BNP (last 3 results) No results for input(s): BNP in the last 8760 hours.  HEMOGLOBIN A1C Lab Results  Component Value Date   HGBA1C 11.4* 12/07/2014   MPG 246* 01/22/2012    Cardiac Panel (last 3 results) No results for input(s): CKTOTAL, CKMB, TROPONINI, RELINDX in the last 8760 hours.  Lab Results  Component Value Date   CKTOTAL 69 10/28/2007   CKMB 1.5 10/28/2007   TROPONINI 0.02        NO INDICATION OF MYOCARDIAL INJURY. 10/28/2007     EKG: Sinus rhythm with first-degree AV block at a rate of 72 bpm, left axis deviation, left anterior fascicular block. Poor R-wave progression, IVCD, borderline criteria for LVH. Normal QT interval..   Radiology: Dg Chest 2 View  02/12/2015  CLINICAL DATA:  Right side  chest pain and shortness of breath for 6 hours, history of diabetes EXAM: CHEST  2 VIEW COMPARISON:  10/27/2007 FINDINGS: Cardiomediastinal silhouette is unremarkable. No acute infiltrate or pleural effusion. No pulmonary edema. Mild degenerative changes thoracic spine. IMPRESSION: No active cardiopulmonary disease. Mild degenerative changes thoracic spine. Electronically Signed   By:  Lahoma Crocker M.D.   On: 02/12/2015 10:44    FOLLOW UP PLANS AND APPOINTMENTS: Follow-up with Dr. Einar Gip in 2 weeks. Patient can return to work on Monday, 02/18/2015.    Medication List    TAKE these medications        ACCU-CHEK SOFTCLIX LANCETS lancets  Use to test blood sugar 2 times daily as instructed. Dx code: 250.42     amLODipine 10 MG tablet  Commonly known as:  NORVASC  TAKE 1 TABLET (10 MG TOTAL) BY MOUTH DAILY.     aspirin 325 MG EC tablet  Take 325 mg by mouth daily.     CRESTOR 20 MG tablet  Generic drug:  rosuvastatin  TAKE 1 BY MOUTH ONCE DAILY     doxazosin 4 MG tablet  Commonly known as:  CARDURA  TAKE 1 TABLET (4 MG TOTAL) BY MOUTH AT BEDTIME.     fluticasone 50 MCG/ACT nasal spray  Commonly known as:  FLONASE  USE 2 SPRAYS IN EACH NOSTRIL 2 TIMES A DAY     glucose blood test strip  Commonly known as:  ACCU-CHEK AVIVA PLUS  Use to test blood sugar 3 times daily as instructed. Dx code: 250.42     hydrALAZINE 50 MG tablet  Commonly known as:  APRESOLINE  Take 1 tablet (50 mg total) by mouth 3 (three) times daily.     Insulin Pen Needle 32G X 4 MM Misc  Use 3x a day     Insulin Syringe-Needle U-100 30G X 1/2" 1 ML Misc  Commonly known as:  B-D INS SYR ULTRAFINE 1CC/30G  Use to inject insulin once daily dx: E11.29     LEVEMIR 100 UNIT/ML injection  Generic drug:  insulin detemir  INJECT 0.6 MLS (60 UNITS TOTAL) INTO THE SKIN AT BEDTIME.     lisinopril-hydrochlorothiazide 20-12.5 MG tablet  Commonly known as:  PRINZIDE,ZESTORETIC  TAKE 2 TABLETS BY MOUTH EVERY DAY     metFORMIN 1000 MG tablet  Commonly known as:  GLUCOPHAGE  TAKE 1 TABLET (1,000 MG TOTAL) BY MOUTH 2 (TWO) TIMES DAILY WITH A MEAL.  Start taking on:  02/14/2015     metoprolol 100 MG tablet  Commonly known as:  LOPRESSOR  TAKE 1 TABLET (100 MG TOTAL) BY MOUTH 2 (TWO) TIMES DAILY.     NOVOLOG FLEXPEN 100 UNIT/ML FlexPen  Generic drug:  insulin aspart  INJECT 12-20 UNITS INTO THE  SKIN 3 (THREE) TIMES DAILY WITH MEALS.     omeprazole 40 MG capsule  Commonly known as:  PRILOSEC  Take 1 capsule (40 mg total) by mouth daily.     sildenafil 20 MG tablet  Commonly known as:  REVATIO  Take 3-5 tablets (60-100 mg total) by mouth daily as needed.          Adrian Prows, MD 02/12/2015, 4:56 PM  Pager: [phone] Office: [phone] If no answer: [phone]

## 2015-02-12 NOTE — Progress Notes (Signed)
See mar/ meds given

## 2015-02-12 NOTE — Progress Notes (Signed)
Dr Einar Gip was called regarding HTN and glucose, orders followed for htn, pt to continue DM meds at home.

## 2015-02-13 ENCOUNTER — Encounter (HOSPITAL_COMMUNITY): Payer: Self-pay | Admitting: Cardiology

## 2015-02-13 MED FILL — Heparin Sodium (Porcine) 2 Unit/ML in Sodium Chloride 0.9%: INTRAMUSCULAR | Qty: 500 | Status: AC

## 2015-02-23 ENCOUNTER — Other Ambulatory Visit: Payer: Self-pay | Admitting: Internal Medicine

## 2015-03-06 ENCOUNTER — Other Ambulatory Visit: Payer: Self-pay | Admitting: Internal Medicine

## 2015-03-25 ENCOUNTER — Ambulatory Visit: Payer: 59 | Admitting: Internal Medicine

## 2015-04-08 ENCOUNTER — Other Ambulatory Visit: Payer: Self-pay | Admitting: Internal Medicine

## 2015-04-21 ENCOUNTER — Other Ambulatory Visit: Payer: Self-pay | Admitting: Internal Medicine

## 2015-04-26 ENCOUNTER — Other Ambulatory Visit: Payer: Self-pay | Admitting: Internal Medicine

## 2015-04-26 NOTE — Telephone Encounter (Signed)
Pt requests his novolog be sent into [facility] in [place]

## 2015-05-06 ENCOUNTER — Other Ambulatory Visit: Payer: Self-pay | Admitting: Internal Medicine

## 2015-06-03 ENCOUNTER — Other Ambulatory Visit: Payer: Self-pay | Admitting: Internal Medicine

## 2015-06-04 ENCOUNTER — Other Ambulatory Visit: Payer: Self-pay | Admitting: Internal Medicine

## 2015-06-15 ENCOUNTER — Other Ambulatory Visit: Payer: Self-pay | Admitting: Internal Medicine

## 2015-06-23 ENCOUNTER — Other Ambulatory Visit: Payer: Self-pay | Admitting: Internal Medicine

## 2015-06-28 ENCOUNTER — Encounter: Payer: Self-pay | Admitting: Internal Medicine

## 2015-06-28 ENCOUNTER — Ambulatory Visit (INDEPENDENT_AMBULATORY_CARE_PROVIDER_SITE_OTHER): Payer: 59 | Admitting: Internal Medicine

## 2015-06-28 VITALS — BP 136/72 | HR 65 | Temp 98.0°F | Resp 12 | Wt 275.0 lb

## 2015-06-28 DIAGNOSIS — E1165 Type 2 diabetes mellitus with hyperglycemia: Secondary | ICD-10-CM

## 2015-06-28 DIAGNOSIS — N183 Chronic kidney disease, stage 3 (moderate): Secondary | ICD-10-CM

## 2015-06-28 DIAGNOSIS — E119 Type 2 diabetes mellitus without complications: Secondary | ICD-10-CM | POA: Insufficient documentation

## 2015-06-28 DIAGNOSIS — E1142 Type 2 diabetes mellitus with diabetic polyneuropathy: Secondary | ICD-10-CM | POA: Insufficient documentation

## 2015-06-28 DIAGNOSIS — N185 Chronic kidney disease, stage 5: Secondary | ICD-10-CM | POA: Insufficient documentation

## 2015-06-28 DIAGNOSIS — E1122 Type 2 diabetes mellitus with diabetic chronic kidney disease: Secondary | ICD-10-CM | POA: Diagnosis not present

## 2015-06-28 DIAGNOSIS — Z794 Long term (current) use of insulin: Secondary | ICD-10-CM

## 2015-06-28 DIAGNOSIS — IMO0002 Reserved for concepts with insufficient information to code with codable children: Secondary | ICD-10-CM

## 2015-06-28 LAB — LIPID PANEL
CHOL/HDL RATIO: 4
CHOLESTEROL: 136 mg/dL (ref 0–200)
HDL: 32 mg/dL — ABNORMAL LOW (ref >39.00)
NonHDL: 103.56
Triglycerides: 325 mg/dL — ABNORMAL HIGH (ref 0.0–149.0)
VLDL: 65 mg/dL — ABNORMAL HIGH (ref 0.0–40.0)

## 2015-06-28 LAB — LDL CHOLESTEROL, DIRECT: Direct LDL: 36 mg/dL

## 2015-06-28 LAB — COMPLETE METABOLIC PANEL WITH GFR
ALBUMIN: 3.4 g/dL — AB (ref 3.6–5.1)
ALT: 17 U/L (ref 9–46)
AST: 14 U/L (ref 10–35)
Alkaline Phosphatase: 91 U/L (ref 40–115)
BUN: 30 mg/dL — AB (ref 7–25)
CALCIUM: 8.8 mg/dL (ref 8.6–10.3)
CHLORIDE: 103 mmol/L (ref 98–110)
CO2: 24 mmol/L (ref 20–31)
CREATININE: 1.8 mg/dL — AB (ref 0.70–1.33)
GFR, Est African American: 49 mL/min — ABNORMAL LOW (ref >=60)
GFR, Est Non African American: 42 mL/min — ABNORMAL LOW (ref >=60)
Glucose, Bld: 202 mg/dL — ABNORMAL HIGH (ref 65–99)
Potassium: 3.8 mmol/L (ref 3.5–5.3)
SODIUM: 139 mmol/L (ref 135–146)
Total Bilirubin: 0.8 mg/dL (ref 0.2–1.2)
Total Protein: 5.8 g/dL — ABNORMAL LOW (ref 6.1–8.1)

## 2015-06-28 LAB — POCT GLYCOSYLATED HEMOGLOBIN (HGB A1C): HEMOGLOBIN A1C: 11.3

## 2015-06-28 MED ORDER — METFORMIN HCL 1000 MG PO TABS: ORAL_TABLET | ORAL | Status: DC

## 2015-06-28 NOTE — Patient Instructions (Signed)
Please continue: - Metformin 1000 mg 2x a day - Levemir 65 units at bedtime - Mealtime insulin NovoLog 10-20 units depending on the size of the meal.  Please stop at the lab.  Please return in 3 months with your sugar log.   Please consider the following ways to cut down carbs and fat and increase fiber and micronutrients in your diet: - substitute whole grain for white bread or pasta - substitute brown rice for white rice - substitute 90-calorie flatbread pieces for slices of bread when possible - substitute sweet potatoes or yams for white potatoes - substitute humus for margarine - substitute tofu for cheese when possible - substitute almond or rice milk for regular milk - substitute dark chocolate for other sweets when possible - substitute water - can add lemon/orange/lime/kiwi slices for taste - for diet sodas (artificial sweeteners will trick your body that you can eat sweets without getting calories and will lead you to overeating and weight gain in the long run) - do not skip breakfast or other meals (this will slow down the metabolism and will result in more weight gain over time)  - can try smoothies made from fruit and almond/rice milk in am instead of regular breakfast - can also try old-fashioned (not instant) oatmeal made with almond/rice milk in am - order the dressing on the side when eating salad at a restaurant (pour less than half of the dressing on the salad) - eat as little meat as possible  - can try juicing, but should not forget that juicing will get rid of the fiber, so would alternate with eating raw veg./fruits or drinking smoothies - use as little oil as possible, even when using olive oil - can dress a salad with a mix of balsamic vinegar and lemon juice, for e.g. - use agave nectar, stevia sugar, or regular sugar rather than artificial sweateners - steam or broil/roast veggies  - snack on veggies/fruit/nuts (unsalted, preferably) when possible, rather than  processed foods - reduce or eliminate aspartame in diet (it is in diet sodas, chewing gum, etc) Read the labels!  Try to read Dr. Janene Harvey book: "Program for Reversing Diabetes" for the vegan concept and other ideas for healthy eating.

## 2015-06-28 NOTE — Progress Notes (Signed)
Subjective:     Patient ID: Samuel Reed, male   DOB: 1960/09/26, 55 y.o.   MRN: [id number]  Diabetes   Samuel Reed is a 55 y.o. man returning for management of DM2, dx 2003, insulin-dependent, uncontrolled, with complications (nephropathy; retinopathy OU; neuropathy). Last visit was 7 mo ago, did not return in 1.5 months as advised...  Patient was admitted with chest pain in 01/2015. He had a cardiac cath in 01/2015 >> no stents or other interventions suggested. His cardiologist is Dr. Einar Gip: Conclusion     Prox to Mid LAD lesion, 30% mildly stenosed, but otherwise normal coronary arteries.  The left ventricular systolic function is normal. 30 mL contrast used.   Last HbA1C: Lab Results  Component Value Date   HGBA1C 11.4* 12/07/2014   HGBA1C 13.3 09/07/2014   HGBA1C 13.0* 05/17/2014   He is on a regimen of: - Metformin 1000 mg bid - Levemir 65 units at bedtime - Mealtime insulin NovoLog - started 10/2013 - may miss the lunch insulin.  9 units with a smaller meal 10-12 with a regular meal >> 20 units with a larger meal We stopped Tradjenta and Glipizide XL in 10/2013.  He does not bring a sugar log (at last visit he had no checks...) >> not checking now. From last visit: - A.m.: 166, 132, 174, 386, 204, 132 >> n/c >> 117-220, 249, 283 >> 130-180, 310 (snacked at night)  - before lunch: 179, 198 >> 150s - 2h after lunch: 125-225 >> n/c - Before dinner: 92, 252, 117, 213, 97 >> n/c >> 191, 195, 255 >> 130s - Nighttime: 123, 164, 116 ?? >> n/c >> 151,  205, 340 >> 150-175 He has hypoglycemia awareness, but unclear at what sugar level.  He has CKD. He is on Lisinopril 40 mg daily for BP management and microalbuminuria (MAU).  Lab Results  Component Value Date   BUN 29* 02/12/2015   CREATININE 1.81* 02/12/2015  On Lisinopril. He also has HyperTG-emia. On Crestor 20 mg daily.  Lab Results  Component Value Date   CHOL 227* 12/07/2014   HDL 27.60* 12/07/2014   LDLCALC 29  02/17/2008   LDLDIRECT 47.0 12/07/2014   TRIG * 12/07/2014    822.0 Triglyceride is over 400; calculations on Lipids are invalid.   CHOLHDL 8 12/07/2014  On Rosuvastatin 20 mg daily. Last eye exam was in 09/21/2014. Improved retinopathy.  He also has OSA and is on CPAP. 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 Dr Rollene Fare saw him in the past for CP >> a cath was clean. He has OSA and wears CPAP every night.   Review of Systems  Constitutional: + weight gain, no fatigue, no subjective hyperthermia/hypothermia ENT: no sore throat, no nodules palpated in throat, no dysphagia/odynophagia, no hoarseness Cardiovascular: no CP/SOB/palpitations/ + B leg swelling Respiratory: no cough/SOB,  has a stuffy nose Gastrointestinal: no N/V/D/C Musculoskeletal: no muscle/+ joint aches Skin: + rash - L leg (post.) Neurological: no tremors/numbness/tingling/dizziness  I reviewed pt's medications, allergies, PMH, social hx, family hx, and changes were documented in the history of present illness. Otherwise, unchanged from my initial visit note.  Objective:   Physical Exam Pulse 65  Temp(Src) 98 F (36.7 C) (Oral)  Resp 12  Wt 275 lb (124.739 kg)  SpO2 97% Body mass index is 38.37 kg/(m^2).  Wt Readings from Last 3 Encounters:  06/28/15 275 lb (124.739 kg)  02/12/15 275 lb 9.2 oz (125 kg)  12/12/14  275 lb 9.6 oz (125.011 kg)   Constitutional: obese, in NAD Eyes: PERRLA, EOMI, no exophthalmos ENT: moist mucous membranes, no thyromegaly, no cervical lymphadenopathy Cardiovascular: RRR, No MRG, LE swelling pitting 1+ bilat. Respiratory: CTA B Gastrointestinal: abdomen soft, NT, ND, BS+ Musculoskeletal: no deformities, strength intact in all 4 Skin: moist, warm, stasis dermatitis bilat. 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 DM2 - pt with uncontrolled DM2, returning after a longer absence. He is not compliant with office visits and sugar checks ("I am in denial about my diabetes"). HbA1c is 11.2% today, terrible. We have a long discussion about consequences of uncontrolled DM2 and the need to start gaining control over this. He agrees and would also want to start a vegetarian diet, about which he heard a lot of good things. I gave him materials about this and strongly advised him to do it.  - He also needs to start checking sugars, w/o which I cannot adjust his insulin doses... - will check a CMP and Lipid panel. If GFR still low, may need to decrease Metformin dose. Patient Instructions  Please continue: - Metformin 1000 mg 2x a day - Levemir 65 units at bedtime - Mealtime insulin NovoLog 10-20 units depending on the size of the meal.  Please stop at the lab.  Please return in 3 months with your sugar log.   - given more sugar logs - UTD with eye exams and flu shot - Return to clinic in 3 month with his sugar log  Office Visit on 06/28/2015  Component Date Value Ref Range Status  . Hemoglobin A1C 06/28/2015 11.3   Final  . Sodium 06/28/2015 139  135 - 146 mmol/L Final  . Potassium 06/28/2015 3.8  3.5 - 5.3 mmol/L Final  . Chloride 06/28/2015 103  98 - 110 mmol/L Final  . CO2 06/28/2015 24  20 - 31 mmol/L Final  . Glucose, Bld 06/28/2015 202* 65 - 99 mg/dL Final  . BUN 06/28/2015 30* 7 - 25 mg/dL Final  . Creat 06/28/2015 1.80* 0.70 - 1.33 mg/dL Final  . Total Bilirubin 06/28/2015 0.8  0.2 - 1.2 mg/dL Final  . Alkaline Phosphatase 06/28/2015 91  40 - 115 U/L Final  . AST 06/28/2015 14  10 - 35 U/L Final  . ALT 06/28/2015 17  9 - 46 U/L Final  . Total Protein 06/28/2015 5.8* 6.1 - 8.1 g/dL Final  . Albumin  06/28/2015 3.4* 3.6 - 5.1 g/dL Final  . Calcium 06/28/2015 8.8  8.6 - 10.3 mg/dL Final  . GFR, Est African American 06/28/2015 49* >=60 mL/min Final  . GFR, Est Non African American 06/28/2015 42* >=60 mL/min Final   Comment:   The estimated GFR is a calculation valid for adults (>=16 years old) that uses the CKD-EPI algorithm to adjust for age and sex. It is   not to be used for children, pregnant women, hospitalized patients,    patients on dialysis, or with rapidly changing kidney function. According to the NKDEP, eGFR >89 is normal, 60-89 shows mild impairment, 30-59 shows moderate impairment, 15-29 shows severe impairment and <15 is ESRD.     [name]  Cholesterol 06/28/2015 136  0 - 200 mg/dL Final   ATP III Classification       Desirable:  < 200 mg/dL               Borderline High:  200 - 239 mg/dL          High:  > = 240 mg/dL  . Triglycerides 06/28/2015 325.0* 0.0 - 149.0 mg/dL Final   Normal:  <150 mg/dLBorderline High:  150 - 199 mg/dL  . HDL 06/28/2015 32.00* >39.00 mg/dL Final  . VLDL 06/28/2015 65.0* 0.0 - 40.0 mg/dL Final  . Total CHOL/HDL Ratio 06/28/2015 4   Final                  Men          Women1/2 Average Risk     3.4          3.3Average Risk          5.0          4.42X Average Risk          9.6          7.13X Average Risk          15.0          11.0                      . NonHDL 06/28/2015 103.56   Final   NOTE:  Non-HDL goal should be 30 mg/dL higher than patient's LDL goal (i.e. LDL goal of < 70 mg/dL, would have non-HDL goal of < 100 mg/dL)  . Direct LDL 06/28/2015 36.0   Final   Optimal:  <100 mg/dLNear or Above Optimal:  100-129 mg/dLBorderline High:  130-159 mg/dLHigh:  160-189 mg/dLVery High:  >190 mg/dL   TG improved, but still high. LDL at goal. High glucose, decreased kidney function, consistent with prior. Decreased serum proteins. Very high HbA1c.

## 2015-07-01 ENCOUNTER — Ambulatory Visit (INDEPENDENT_AMBULATORY_CARE_PROVIDER_SITE_OTHER): Payer: 59 | Admitting: Internal Medicine

## 2015-07-01 ENCOUNTER — Encounter: Payer: Self-pay | Admitting: Internal Medicine

## 2015-07-01 VITALS — BP 158/78 | HR 73 | Temp 98.5°F | Wt 277.0 lb

## 2015-07-01 DIAGNOSIS — N184 Chronic kidney disease, stage 4 (severe): Secondary | ICD-10-CM | POA: Insufficient documentation

## 2015-07-01 DIAGNOSIS — I8312 Varicose veins of left lower extremity with inflammation: Secondary | ICD-10-CM | POA: Diagnosis not present

## 2015-07-01 DIAGNOSIS — E1122 Type 2 diabetes mellitus with diabetic chronic kidney disease: Secondary | ICD-10-CM

## 2015-07-01 DIAGNOSIS — I872 Venous insufficiency (chronic) (peripheral): Secondary | ICD-10-CM

## 2015-07-01 DIAGNOSIS — N183 Chronic kidney disease, stage 3 unspecified: Secondary | ICD-10-CM

## 2015-07-01 DIAGNOSIS — IMO0002 Reserved for concepts with insufficient information to code with codable children: Secondary | ICD-10-CM

## 2015-07-01 DIAGNOSIS — E1165 Type 2 diabetes mellitus with hyperglycemia: Secondary | ICD-10-CM | POA: Diagnosis not present

## 2015-07-01 DIAGNOSIS — N186 End stage renal disease: Secondary | ICD-10-CM | POA: Insufficient documentation

## 2015-07-01 DIAGNOSIS — N185 Chronic kidney disease, stage 5: Secondary | ICD-10-CM | POA: Insufficient documentation

## 2015-07-01 DIAGNOSIS — Z794 Long term (current) use of insulin: Secondary | ICD-10-CM

## 2015-07-01 MED ORDER — FUROSEMIDE 40 MG PO TABS: 40.0000 mg | ORAL_TABLET | Freq: Every day | ORAL | Status: DC
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
## 2015-07-01 NOTE — Assessment & Plan Note (Addendum)
Creatinine up since my last visit Will probably go up more with the furosemide Also, BP periodically elevated Will set up with for initial evaluation with nephrologist

## 2015-07-01 NOTE — Assessment & Plan Note (Signed)
Mild Will start lasix Discussed moisturizers but no other Rx indicated at present

## 2015-07-01 NOTE — Progress Notes (Signed)
Pre visit review using our clinic review tool, if applicable. No additional management support is needed unless otherwise documented below in the visit note. 

## 2015-07-13 ENCOUNTER — Other Ambulatory Visit: Payer: Self-pay | Admitting: Internal Medicine

## 2015-08-06 ENCOUNTER — Telehealth: Payer: Self-pay | Admitting: Internal Medicine

## 2015-08-06 MED ORDER — INSULIN ASPART 100 UNIT/ML FLEXPEN: PEN_INJECTOR | SUBCUTANEOUS | Status: DC

## 2015-08-06 NOTE — Telephone Encounter (Signed)
rx submitted per pt's request.  

## 2015-08-06 NOTE — Telephone Encounter (Signed)
[facility] [place] [facility] flexpen

## 2015-08-11 ENCOUNTER — Telehealth: Payer: Self-pay | Admitting: Internal Medicine

## 2015-08-12 ENCOUNTER — Telehealth: Payer: Self-pay | Admitting: Internal Medicine

## 2015-08-12 ENCOUNTER — Other Ambulatory Visit: Payer: Self-pay | Admitting: Internal Medicine

## 2015-08-12 ENCOUNTER — Other Ambulatory Visit: Payer: Self-pay

## 2015-08-12 MED ORDER — INSULIN DETEMIR 100 UNIT/ML ~~LOC~~ SOLN
65.0000 [IU] | Freq: Every day | SUBCUTANEOUS | Status: DC
Start: 2015-08-12 — End: 2015-10-11

## 2015-08-12 NOTE — Telephone Encounter (Signed)
Reordered Levemir into pharmacy.

## 2015-08-12 NOTE — Telephone Encounter (Signed)
Is [facility] part of [facility]? If so, can just ask them about making a change for him. If not, I can put in consult for [facility] for [facility]ib[facility] (unless you can just do it for me)

## 2015-08-12 NOTE — Telephone Encounter (Signed)
[facility] [place]

## 2015-08-12 NOTE — Telephone Encounter (Signed)
Patient would like to be referred to cardiologist Dr. Peter Martinique instead of his present cardiologist.

## 2015-08-14 ENCOUNTER — Other Ambulatory Visit: Payer: Self-pay | Admitting: Internal Medicine

## 2015-08-15 NOTE — Telephone Encounter (Signed)
Please put the Cardiology Referral in for Dr Peter Martinique. He is taking New patients and is 3 months out but the patient still says he wants to see him. Please refer.

## 2015-08-15 NOTE — Telephone Encounter (Signed)
That is not much and usually doesn't require special attention

## 2015-08-15 NOTE — Telephone Encounter (Signed)
Sorry, but please call him back The recent catheterization didn't show any real significant coronary artery disease. I don't think he needs a cardiologist at present. When he comes back to see me, I can review all this and make a new referral if that seems to be appropriate.

## 2015-08-15 NOTE — Telephone Encounter (Signed)
The patient has a physical with you in November and he will discuss with you then. He said Dr Cruzita Lederer is the one who told him he has a 30 % blockage.

## 2015-09-02 ENCOUNTER — Other Ambulatory Visit: Payer: Self-pay

## 2015-09-02 MED ORDER — INSULIN ASPART 100 UNIT/ML FLEXPEN: PEN_INJECTOR | SUBCUTANEOUS | 2 refills | Status: DC

## 2015-09-02 NOTE — Telephone Encounter (Signed)
PT does not have enough Novolog to get him to his 09/27/15 appt, needs [facility]ill [facility]

## 2015-09-07 ENCOUNTER — Other Ambulatory Visit: Payer: Self-pay | Admitting: Internal Medicine

## 2015-09-27 ENCOUNTER — Ambulatory Visit: Payer: 59 | Admitting: Internal Medicine

## 2015-10-05 ENCOUNTER — Other Ambulatory Visit: Payer: Self-pay | Admitting: Internal Medicine

## 2015-10-11 ENCOUNTER — Other Ambulatory Visit: Payer: Self-pay | Admitting: Internal Medicine

## 2015-10-18 LAB — HM DIABETES EYE EXAM

## 2015-10-23 ENCOUNTER — Encounter: Payer: Self-pay | Admitting: Internal Medicine

## 2015-10-27 ENCOUNTER — Other Ambulatory Visit: Payer: Self-pay | Admitting: Internal Medicine

## 2015-10-31 ENCOUNTER — Ambulatory Visit (INDEPENDENT_AMBULATORY_CARE_PROVIDER_SITE_OTHER): Payer: 59 | Admitting: Internal Medicine

## 2015-10-31 ENCOUNTER — Encounter: Payer: Self-pay | Admitting: Internal Medicine

## 2015-10-31 VITALS — BP 128/80 | HR 70 | Ht 72.0 in | Wt 258.0 lb

## 2015-10-31 DIAGNOSIS — N183 Chronic kidney disease, stage 3 (moderate): Secondary | ICD-10-CM | POA: Diagnosis not present

## 2015-10-31 DIAGNOSIS — IMO0002 Reserved for concepts with insufficient information to code with codable children: Secondary | ICD-10-CM

## 2015-10-31 DIAGNOSIS — E1122 Type 2 diabetes mellitus with diabetic chronic kidney disease: Secondary | ICD-10-CM | POA: Diagnosis not present

## 2015-10-31 DIAGNOSIS — Z794 Long term (current) use of insulin: Secondary | ICD-10-CM | POA: Diagnosis not present

## 2015-10-31 DIAGNOSIS — Z23 Encounter for immunization: Secondary | ICD-10-CM

## 2015-10-31 DIAGNOSIS — E1165 Type 2 diabetes mellitus with hyperglycemia: Secondary | ICD-10-CM

## 2015-10-31 LAB — POCT GLYCOSYLATED HEMOGLOBIN (HGB A1C): Hemoglobin A1C: 10.1

## 2015-10-31 NOTE — Progress Notes (Signed)
Subjective:     Patient ID: Samuel Reed, male   DOB: [birth year], 55 y.o.   MRN: [id number]  Diabetes    Samuel Reed is a 55 y.o. man returning for management of DM2, dx 2003, insulin-dependent, uncontrolled, with complications (nephropathy; retinopathy OU; neuropathy). Last visit was 4 mo ago.  He lost 20 lbs since last visit.  Last HbA1C: Lab Results  Component Value Date   HGBA1C 11.3 06/28/2015   HGBA1C 11.4 (H) 12/07/2014   HGBA1C 13.3 09/07/2014   He is on a regimen of: - Metformin 1000 mg bid - Levemir 65 units at bedtime - Mealtime insulin NovoLog - started 10/2013 - usually take 14 units per meal (20 units before a meal if goes out to eat).  We stopped Tradjenta and Glipizide XL in 10/2013.  He brings log: - A.m.: 166, 132, 174, 386, 204, 132 >> n/c >> 117-220, 249, 283 >> 130-180, 310 (snacked at night) >> 159, 205 - after b'fast: 113-325 - before lunch: 179, 198 >> 150s >> 190, 210 - 2h after lunch: 125-225 >> n/c >> 208-303 - Before dinner: 92, 252, 117, 213, 97 >> n/c >> 191, 195, 255 >> 130s >> 153, 157 - Nighttime: 123, 164, 116 ?? >> n/c >> 151,  205, 340 >> 150-175 >> n/c He has hypoglycemia awareness, but unclear at what sugar level.  He has CKD. He is on Lisinopril 40 mg daily for BP management and microalbuminuria (MAU).  Lab Results  Component Value Date   BUN 30 (H) 06/28/2015   CREATININE 1.80 (H) 06/28/2015  On Lisinopril. He also has HyperTG-emia. On Crestor 20 mg daily.  Lab Results  Component Value Date   CHOL 136 06/28/2015   HDL 32.00 (L) 06/28/2015   LDLCALC 29 02/17/2008   LDLDIRECT 36.0 06/28/2015   TRIG 325.0 (H) 06/28/2015   CHOLHDL 4 06/28/2015  On Rosuvastatin 20 mg daily. Last eye exam was: Component Date Value  . HM Diabetic Eye Exam 10/18/2015 Retinopathy*   He also has OSA and is on CPAP. 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 Dr Rollene Fare saw him in the past for CP >> a cath was clean. He has OSA and wears CPAP  every night.  Patient was admitted with chest pain in 01/2015. He had a cardiac cath in 01/2015 >> no stents or other interventions suggested. His cardiologist is Dr. Einar Gip: Conclusion     Prox to Mid LAD lesion, 30% mildly stenosed, but otherwise normal coronary arteries.  The left ventricular systolic function is normal. 30 mL contrast used.   Review of Systems  Constitutional: + weight loss, no fatigue, no subjective hyperthermia/hypothermia ENT: no sore throat, no nodules palpated in throat, no dysphagia/odynophagia, no hoarseness Cardiovascular: no CP/SOB/palpitations/ + B leg swelling Respiratory: no cough/SOB Gastrointestinal: no N/V/D/C Musculoskeletal: no muscle/ joint aches Skin: no rash Neurological: no tremors/numbness/tingling/dizziness  I reviewed pt's medications, allergies, PMH, social hx, family hx, and changes were documented in the history of present illness. Otherwise, unchanged from my initial visit note.  Objective:   Physical Exam BP 128/80 (BP Location: Left Arm, Patient Position: Sitting)   Pulse 70   Ht 6' (1.829 m)   Wt 258 lb (117 kg)   SpO2 97%   BMI 34.99 kg/m  Body mass index is 34.99 kg/m.  Wt Readings from Last 3 Encounters:  10/31/15 258 lb (117 kg)  07/01/15 277 lb (125.6 kg)  06/28/15 275 lb (124.7 kg)  Constitutional: obese, in NAD Eyes: PERRLA, EOMI, no exophthalmos ENT: moist mucous membranes, no thyromegaly, no cervical lymphadenopathy Cardiovascular: RRR, No MRG, LE swelling pitting 1+ bilat. Respiratory: CTA B Gastrointestinal: abdomen soft, NT, ND, BS+ Musculoskeletal: no deformities, strength intact in all 4 Skin: moist, warm, stasis dermatitis bilat. 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 DM2 - pt with uncontrolled DM2. He was not compliant with office visits and sugar checks but now he is determined to start improving his DM. At last visit HbA1c was very high: 11.2% at last visit. At last visit we had a long discussion about consequences of uncontrolled DM2 and the need to start gaining control over this. He wanted to start a vegetarian diet, about which he heard a lot of good things. I strongly encouraged him to do this. He is not vegetarian now, but continue to improve his diet >> lost ~20 lbs since last visit.  - sugars are still variable and still high, but better >> will increase Novolog - At this visit, I suggested to: Patient Instructions  Please continue: - Metformin 1000 mg 2x a day - Levemir 65 units at bedtime  Please increase: - Mealtime insulin NovoLog: 14 units before a smaller meal 18-20 units before a larger meal 24 units before a very large meal  Please check sugars before meals and at bedtime.  Please return in 1.5 months with your sugar log.   - given more sugar logs - UTD with eye exams  - will give glu shot today - Return to clinic in 1.5 month with his sugar log  Philemon Kingdom, MD PhD [facility] Endocrinology

## 2015-10-31 NOTE — Patient Instructions (Addendum)
Please continue: - Metformin 1000 mg 2x a day - Levemir 65 units at bedtime  Please increase: - Mealtime insulin NovoLog: 14 units before a smaller meal 18-20 units before a larger meal 24 units before a very large meal  Please check sugars before meals and at bedtime.  Please return in 1.5 months with your sugar log.

## 2015-11-08 ENCOUNTER — Other Ambulatory Visit: Payer: Self-pay

## 2015-11-08 ENCOUNTER — Telehealth: Payer: Self-pay | Admitting: Internal Medicine

## 2015-11-08 ENCOUNTER — Other Ambulatory Visit: Payer: Self-pay | Admitting: Internal Medicine

## 2015-11-08 MED ORDER — INSULIN ASPART 100 UNIT/ML FLEXPEN: PEN_INJECTOR | SUBCUTANEOUS | 2 refills | Status: DC

## 2015-11-08 NOTE — Telephone Encounter (Signed)
Pt needs his increased dosage of Novolog sent into the pharmacy, he said that Dr. Cruzita Lederer upped his dosage 20 units and that is why he has been running out so quickly.  Please send refills in as soon as possible, he will be out tomorrow.

## 2015-11-08 NOTE — Telephone Encounter (Signed)
Ordered the increased amount of Novolog for the patient and called and left a voice mail to let the patient know the prescription was sent in

## 2015-11-08 NOTE — Telephone Encounter (Signed)
Patient ask if you could up his Novalog to last him through out the month.his b/s has been running low. [id number] [place], [place] [phone] (Phone) [phone] (Fax)

## 2015-11-26 ENCOUNTER — Encounter: Payer: Self-pay | Admitting: *Deleted

## 2015-11-26 ENCOUNTER — Other Ambulatory Visit: Payer: Self-pay | Admitting: Internal Medicine

## 2015-11-27 ENCOUNTER — Telehealth: Payer: Self-pay | Admitting: Internal Medicine

## 2015-11-27 NOTE — Telephone Encounter (Signed)
Pt called in and said that his wife lost her job and it has become difficult to afford his Novolog.  He would like to know if there might be an alternative that he can be prescribed that would be more affordable.  Please advise.

## 2015-11-27 NOTE — Telephone Encounter (Signed)
He has, we can use Novolin R (if this is not covered, then Humulin R) at the same doses as NovoLog, but please advise him to take this insulin 30 minutes, rather than 15 minutes before a meal.

## 2015-11-28 ENCOUNTER — Other Ambulatory Visit: Payer: Self-pay

## 2015-11-28 MED ORDER — INSULIN REGULAR HUMAN 100 UNIT/ML IJ SOLN: INTRAMUSCULAR | 3 refills | Status: DC

## 2015-11-28 NOTE — Telephone Encounter (Signed)
Called and notified patient of insulin change, and advised of when to take it. Patient had no questions at this time. I advised him to call back if any questions or concerns.

## 2015-12-03 ENCOUNTER — Other Ambulatory Visit: Payer: Self-pay | Admitting: Internal Medicine

## 2015-12-03 DIAGNOSIS — IMO0002 Reserved for concepts with insufficient information to code with codable children: Secondary | ICD-10-CM

## 2015-12-03 DIAGNOSIS — E1165 Type 2 diabetes mellitus with hyperglycemia: Principal | ICD-10-CM

## 2015-12-03 DIAGNOSIS — N183 Chronic kidney disease, stage 3 (moderate): Principal | ICD-10-CM

## 2015-12-03 DIAGNOSIS — E1122 Type 2 diabetes mellitus with diabetic chronic kidney disease: Secondary | ICD-10-CM

## 2015-12-03 DIAGNOSIS — Z794 Long term (current) use of insulin: Principal | ICD-10-CM

## 2015-12-13 ENCOUNTER — Encounter: Payer: 59 | Admitting: Internal Medicine

## 2015-12-17 ENCOUNTER — Ambulatory Visit: Payer: 59 | Admitting: Internal Medicine

## 2015-12-17 DIAGNOSIS — Z0289 Encounter for other administrative examinations: Secondary | ICD-10-CM

## 2015-12-25 ENCOUNTER — Telehealth: Payer: Self-pay | Admitting: Internal Medicine

## 2015-12-25 ENCOUNTER — Encounter: Payer: Self-pay | Admitting: Internal Medicine

## 2015-12-25 NOTE — Telephone Encounter (Signed)
His GFR is at 30. He is concerned that the colonoscopy could cause further renal damage.  What are your thoughts?  Would you like to see him in person to discuss?

## 2015-12-26 NOTE — Telephone Encounter (Signed)
I am comfortable proceeding to repeat colonoscopy. Renal dysfunction is noted and the prep and sedation medications are felt safe for patients with chronic renal insuff. If this explanation is okay for him he can come for direct recall, if not, I am happy to see him in clinic 1st

## 2015-12-26 NOTE — Telephone Encounter (Signed)
Patient notified.  He is comfortable proceeding with the procedure as a direct.  He will call back for any additional questions or concerns.

## 2015-12-31 ENCOUNTER — Other Ambulatory Visit: Payer: Self-pay | Admitting: Internal Medicine

## 2016-01-02 ENCOUNTER — Other Ambulatory Visit: Payer: Self-pay | Admitting: Internal Medicine

## 2016-01-09 LAB — HM DIABETES EYE EXAM

## 2016-01-13 ENCOUNTER — Other Ambulatory Visit: Payer: Self-pay | Admitting: Internal Medicine

## 2016-01-18 ENCOUNTER — Other Ambulatory Visit: Payer: Self-pay | Admitting: Internal Medicine

## 2016-01-31 ENCOUNTER — Other Ambulatory Visit: Payer: Self-pay | Admitting: Internal Medicine

## 2016-02-01 ENCOUNTER — Other Ambulatory Visit: Payer: Self-pay | Admitting: Internal Medicine

## 2016-02-12 ENCOUNTER — Encounter: Payer: 59 | Admitting: Internal Medicine

## 2016-02-14 DIAGNOSIS — E1122 Type 2 diabetes mellitus with diabetic chronic kidney disease: Secondary | ICD-10-CM | POA: Diagnosis not present

## 2016-02-14 DIAGNOSIS — I10 Essential (primary) hypertension: Secondary | ICD-10-CM | POA: Diagnosis not present

## 2016-02-14 DIAGNOSIS — N183 Chronic kidney disease, stage 3 (moderate): Secondary | ICD-10-CM | POA: Diagnosis not present

## 2016-02-17 ENCOUNTER — Telehealth: Payer: Self-pay | Admitting: *Deleted

## 2016-02-17 NOTE — Telephone Encounter (Signed)
Dr. Hilarie Fredrickson,  I noted your TE about this pt from 12-25-15.  Just wanted to make sure you are ok with Suprep for this pt.  Thanks, [employer]

## 2016-02-18 NOTE — Telephone Encounter (Signed)
Okay for [employer]

## 2016-02-18 NOTE — Telephone Encounter (Signed)
Noted  

## 2016-02-19 ENCOUNTER — Ambulatory Visit (AMBULATORY_SURGERY_CENTER): Payer: Self-pay | Admitting: *Deleted

## 2016-02-19 VITALS — Ht 72.0 in | Wt 271.4 lb

## 2016-02-19 DIAGNOSIS — Z86010 Personal history of colonic polyps: Secondary | ICD-10-CM

## 2016-02-19 MED ORDER — NA SULFATE-K SULFATE-MG SULF 17.5-3.13-1.6 GM/177ML PO SOLN: 1.0000 | Freq: Once | ORAL | 0 refills | Status: AC

## 2016-02-19 NOTE — Progress Notes (Signed)
Denies allergies to eggs or soy products. Denies complications with sedation or anesthesia. Denies O2 use. Denies use of diet or weight loss medications.  Emmi instructions given for colonoscopy.  

## 2016-02-23 ENCOUNTER — Other Ambulatory Visit: Payer: Self-pay | Admitting: Internal Medicine

## 2016-02-26 ENCOUNTER — Ambulatory Visit (AMBULATORY_SURGERY_CENTER): Payer: 59 | Admitting: Internal Medicine

## 2016-02-26 ENCOUNTER — Encounter: Payer: Self-pay | Admitting: Internal Medicine

## 2016-02-26 ENCOUNTER — Other Ambulatory Visit: Payer: Self-pay | Admitting: Internal Medicine

## 2016-02-26 VITALS — BP 119/69 | HR 65 | Temp 96.8°F | Resp 18 | Ht 72.0 in | Wt 272.0 lb

## 2016-02-26 DIAGNOSIS — I2510 Atherosclerotic heart disease of native coronary artery without angina pectoris: Secondary | ICD-10-CM | POA: Diagnosis not present

## 2016-02-26 DIAGNOSIS — D125 Benign neoplasm of sigmoid colon: Secondary | ICD-10-CM | POA: Diagnosis not present

## 2016-02-26 DIAGNOSIS — Z86010 Personal history of colonic polyps: Secondary | ICD-10-CM | POA: Diagnosis not present

## 2016-02-26 DIAGNOSIS — E119 Type 2 diabetes mellitus without complications: Secondary | ICD-10-CM | POA: Diagnosis not present

## 2016-02-26 DIAGNOSIS — D123 Benign neoplasm of transverse colon: Secondary | ICD-10-CM | POA: Diagnosis not present

## 2016-02-26 DIAGNOSIS — D124 Benign neoplasm of descending colon: Secondary | ICD-10-CM

## 2016-02-26 DIAGNOSIS — K635 Polyp of colon: Secondary | ICD-10-CM

## 2016-02-26 DIAGNOSIS — Z1211 Encounter for screening for malignant neoplasm of colon: Secondary | ICD-10-CM | POA: Diagnosis not present

## 2016-02-26 LAB — GLUCOSE, CAPILLARY
Glucose-Capillary: 175 mg/dL — ABNORMAL HIGH (ref 65–99)
Glucose-Capillary: 185 mg/dL — ABNORMAL HIGH (ref 65–99)

## 2016-02-26 MED ORDER — SODIUM CHLORIDE 0.9 % IV SOLN: 500.0000 mL | INTRAVENOUS | Status: DC

## 2016-02-26 NOTE — Patient Instructions (Signed)
Discharge instructions given. Handout on polyps. Resume [previous  Medications. YOU HAD AN ENDOSCOPIC PROCEDURE TODAY AT [facility]:   Refer to the procedure report that was given to you for any specific questions about what was found during the examination.  If the procedure report does not answer your questions, please call your gastroenterologist to clarify.  If you requested that your care partner not be given the details of your procedure findings, then the procedure report has been included in a sealed envelope for you to review at your convenience later.  YOU SHOULD EXPECT: Some feelings of bloating in the abdomen. Passage of more gas than usual.  Walking can help get rid of the air that was put into your GI tract during the procedure and reduce the bloating. If you had a lower endoscopy (such as a colonoscopy or flexible sigmoidoscopy) you may notice spotting of blood in your stool or on the toilet paper. If you underwent a bowel prep for your procedure, you may not have a normal bowel movement for a few days.  Please Note:  You might notice some irritation and congestion in your nose or some drainage.  This is from the oxygen used during your procedure.  There is no need for concern and it should clear up in a day or so.  SYMPTOMS TO REPORT IMMEDIATELY:   Following lower endoscopy (colonoscopy or flexible sigmoidoscopy):  Excessive amounts of blood in the stool  Significant tenderness or worsening of abdominal pains  Swelling of the abdomen that is new, acute  Fever of 100F or higher   For urgent or emergent issues, a gastroenterologist can be reached at any hour by calling [phone].   DIET:  We do recommend a small meal at first, but then you may proceed to your regular diet.  Drink plenty of fluids but you should avoid alcoholic beverages for 24 hours.  ACTIVITY:  You should plan to take it easy for the rest of today and you should NOT DRIVE or use heavy  machinery until tomorrow (because of the sedation medicines used during the test).    FOLLOW UP: Our staff will call the number listed on your records the next business day following your procedure to check on you and address any questions or concerns that you may have regarding the information given to you following your procedure. If we do not reach you, we will leave a message.  However, if you are feeling well and you are not experiencing any problems, there is no need to return our call.  We will assume that you have returned to your regular daily activities without incident.  If any biopsies were taken you will be contacted by phone or by letter within the next 1-3 weeks.  Please call us at [phone] if you have not heard about the biopsies in 3 weeks.    SIGNATURES/CONFIDENTIALITY: You and/or your care partner have signed paperwork which will be entered into your electronic medical record.  These signatures attest to the fact that that the information above on your After Visit Summary has been reviewed and is understood.  Full responsibility of the confidentiality of this discharge information lies with you and/or your care-partner.

## 2016-02-26 NOTE — Progress Notes (Signed)
Spontaneous respirations throughout. VSS. Resting comfortably. To PACU on room air. Report to  Celia RN. 

## 2016-02-26 NOTE — Progress Notes (Signed)
Called to room to assist during endoscopic procedure.  Patient ID and intended procedure confirmed with present staff. Received instructions for my participation in the procedure from the performing physician.  

## 2016-02-26 NOTE — Op Note (Signed)
[place] Patient Name: Samuel Reed Procedure Date: 02/26/2016 7:55 AM MRN: [id number] Endoscopist: Jerene Bears , MD Age: 56 Referring MD:  Date of Birth: January 15, 1961 Gender: Male Account #: [id number] Procedure:                Colonoscopy Indications:              Surveillance: Personal history of adenomatous                            polyps on last colonoscopy 3 years ago Medicines:                Monitored Anesthesia Care Procedure:                Pre-Anesthesia Assessment:                           - Prior to the procedure, a History and Physical                            was performed, and patient medications and                            allergies were reviewed. The patient's tolerance of                            previous anesthesia was also reviewed. The risks                            and benefits of the procedure and the sedation                            options and risks were discussed with the patient.                            All questions were answered, and informed consent                            was obtained. Prior Anticoagulants: The patient has                            taken no previous anticoagulant or antiplatelet                            agents. ASA Grade Assessment: II - A patient with                            mild systemic disease. After reviewing the risks                            and benefits, the patient was deemed in                            satisfactory condition to undergo the procedure.  After obtaining informed consent, the colonoscope                            was passed under direct vision. Throughout the                            procedure, the patient's blood pressure, pulse, and                            oxygen saturations were monitored continuously. The                            Model CF-HQ190L [id number]) scope was introduced                            through the anus and advanced  to the the cecum,                            identified by appendiceal orifice and ileocecal                            valve. The colonoscopy was performed without                            difficulty. The patient tolerated the procedure                            well. The quality of the bowel preparation was                            good. The ileocecal valve, appendiceal orifice, and                            rectum were photographed. Scope In: 8:07:57 AM Scope Out: 8:26:17 AM Scope Withdrawal Time: 0 hours 15 minutes 59 seconds  Total Procedure Duration: 0 hours 18 minutes 20 seconds  Findings:                 The perianal and digital rectal examinations were                            normal.                           Five sessile polyps were found in the sigmoid colon                            (1), descending colon (3) and transverse colon (1).                            The polyps were 2 to 4 mm in size. These polyps                            were removed with a cold snare. Resection and  retrieval were complete.                           The exam was otherwise without abnormality on                            direct and retroflexion views. Complications:            No immediate complications. Estimated Blood Loss:     Estimated blood loss was minimal. Impression:               - Five 2 to 4 mm polyps in the sigmoid colon, in                            the descending colon and in the transverse colon,                            removed with a cold snare. Resected and retrieved.                           - The examination was otherwise normal on direct                            and retroflexion views. Recommendation:           - Patient has a contact number available for                            emergencies. The signs and symptoms of potential                            delayed complications were discussed with the                             patient. Return to normal activities tomorrow.                            Written discharge instructions were provided to the                            patient.                           - Resume previous diet.                           - Continue present medications.                           - Await pathology results.                           - Repeat colonoscopy is recommended for                            surveillance. The colonoscopy date will be  determined after pathology results from today's                            exam become available for review. Jerene Bears, MD 02/26/2016 8:30:12 AM This report has been signed electronically.

## 2016-02-27 ENCOUNTER — Telehealth: Payer: Self-pay

## 2016-02-27 ENCOUNTER — Ambulatory Visit: Payer: 59 | Admitting: Internal Medicine

## 2016-02-27 ENCOUNTER — Other Ambulatory Visit: Payer: Self-pay | Admitting: Internal Medicine

## 2016-02-27 NOTE — Telephone Encounter (Signed)
  Follow up Call-  Call back number 02/26/2016  Post procedure Call Back phone  # [phone]  Permission to leave phone message Yes  Some recent data might be hidden    Patient was called for follow up after his procedure on 02/26/2016. No answer at the number given for follow up phone call. A message was left on the answering machine.

## 2016-02-27 NOTE — Telephone Encounter (Signed)
  Follow up Call-  Call back number 02/26/2016  Post procedure Call Back phone  # [phone]  Permission to leave phone message Yes  Some recent data might be hidden    Patient was called for follow up after his procedure on 02/26/2016. I spoke to the patients wife and she reports that Samuel Reed has returned to his normal daily activities without any complications.

## 2016-02-28 ENCOUNTER — Other Ambulatory Visit: Payer: Self-pay | Admitting: Internal Medicine

## 2016-03-03 ENCOUNTER — Encounter: Payer: Self-pay | Admitting: Internal Medicine

## 2016-03-03 ENCOUNTER — Telehealth: Payer: Self-pay | Admitting: Internal Medicine

## 2016-03-03 NOTE — Telephone Encounter (Signed)
Pathology reviewed with pt and he will receive a letter in the mail regarding results. Pt verbalized understanding.

## 2016-03-13 ENCOUNTER — Other Ambulatory Visit: Payer: Self-pay | Admitting: Internal Medicine

## 2016-03-21 ENCOUNTER — Other Ambulatory Visit: Payer: Self-pay | Admitting: Internal Medicine

## 2016-04-02 DIAGNOSIS — G4733 Obstructive sleep apnea (adult) (pediatric): Secondary | ICD-10-CM | POA: Diagnosis not present

## 2016-04-08 ENCOUNTER — Other Ambulatory Visit: Payer: Self-pay | Admitting: Internal Medicine

## 2016-04-08 DIAGNOSIS — E1122 Type 2 diabetes mellitus with diabetic chronic kidney disease: Secondary | ICD-10-CM

## 2016-04-08 DIAGNOSIS — Z794 Long term (current) use of insulin: Principal | ICD-10-CM

## 2016-04-08 DIAGNOSIS — IMO0002 Reserved for concepts with insufficient information to code with codable children: Secondary | ICD-10-CM

## 2016-04-08 DIAGNOSIS — E1165 Type 2 diabetes mellitus with hyperglycemia: Principal | ICD-10-CM

## 2016-04-08 DIAGNOSIS — N183 Chronic kidney disease, stage 3 (moderate): Principal | ICD-10-CM

## 2016-04-20 ENCOUNTER — Encounter: Payer: Self-pay | Admitting: Internal Medicine

## 2016-04-20 ENCOUNTER — Ambulatory Visit (INDEPENDENT_AMBULATORY_CARE_PROVIDER_SITE_OTHER): Payer: 59 | Admitting: Internal Medicine

## 2016-04-20 ENCOUNTER — Other Ambulatory Visit: Payer: Self-pay

## 2016-04-20 VITALS — BP 132/82 | HR 71 | Ht 72.0 in | Wt 268.0 lb

## 2016-04-20 DIAGNOSIS — E1122 Type 2 diabetes mellitus with diabetic chronic kidney disease: Secondary | ICD-10-CM

## 2016-04-20 DIAGNOSIS — IMO0002 Reserved for concepts with insufficient information to code with codable children: Secondary | ICD-10-CM

## 2016-04-20 DIAGNOSIS — N183 Chronic kidney disease, stage 3 (moderate): Secondary | ICD-10-CM | POA: Diagnosis not present

## 2016-04-20 DIAGNOSIS — E1165 Type 2 diabetes mellitus with hyperglycemia: Secondary | ICD-10-CM

## 2016-04-20 DIAGNOSIS — Z794 Long term (current) use of insulin: Secondary | ICD-10-CM

## 2016-04-20 LAB — POCT GLYCOSYLATED HEMOGLOBIN (HGB A1C): HEMOGLOBIN A1C: 8.9

## 2016-04-20 MED ORDER — FREESTYLE LIBRE SENSOR SYSTEM MISC: 1.0000 | 11 refills | Status: DC

## 2016-04-20 MED ORDER — "INSULIN SYRINGE-NEEDLE U-100 30G X 1/2"" 1 ML MISC": 3 refills | Status: DC

## 2016-04-20 MED ORDER — FREESTYLE LIBRE READER DEVI: 1.0000 | Freq: Three times a day (TID) | 1 refills | Status: DC

## 2016-04-20 NOTE — Patient Instructions (Addendum)
Please continue: - Metformin 1000 mg 2x a day - Levemir 65 units at bedtime  Please increase: - Mealtime insulin R 14 >> 16 units before a smaller meal 18-20 >> 22 units before a larger meal 24 >> 26 units before a very large meal  Please check sugars before meals and at bedtime.  Try to get the [place] CGM.  Please return in 3 months with your sugar log.

## 2016-04-20 NOTE — Progress Notes (Signed)
Subjective:     Patient ID: Samuel Reed, male   DOB: [birth year], 56 y.o.   MRN: [id number]  Diabetes    Samuel Reed is a 56 y.o. man returning for management of DM2, dx 2003, insulin-dependent, uncontrolled, with complications (nephropathy; retinopathy OU; neuropathy). Last visit was 4 mo ago.  Last HbA1C: Lab Results  Component Value Date   HGBA1C 10.1 10/31/2015   HGBA1C 11.3 06/28/2015   HGBA1C 11.4 (H) 12/07/2014   He is on a regimen of: - Metformin 1000 mg bid - Levemir 65 units at bedtime - Mealtime insulin R 14 units before a smaller meal 18-20 units before a larger meal 24 units before a very large meal We stopped Tradjenta and Glipizide XL in 10/2013.  He brings log: - A.m.:  117-220, 249, 283 >> 130-180, 310 (snacked at night) >> 159, 205 >> 122-160 (if late meal) - after b'fast: 113-325 >> n/c - before lunch: 179, 198 >> 150s >> 190, 210 >> 115-120 - 2h after lunch: 125-225 >> n/c >> 208-303 >> n/c - Before dinner: 92, 252, 117, 213, 97 >> n/c >> 191, 195, 255 >> 130s >> 153, 157 >> n/c - Nighttime: 123, 164, 116 ?? >> n/c >> 151,  205, 340 >> 150-175 >> n/c >> highest, up to 200s He has hypoglycemia awareness, but unclear at what sugar level.  He has CKD.   Lab Results  Component Value Date   BUN 30 (H) 06/28/2015   CREATININE 1.80 (H) 06/28/2015  He is on Lisinopril 40 mg daily for BP management and microalbuminuria (MAU). He also has HyperTG-emia. On Crestor 20 mg daily.  Lab Results  Component Value Date   CHOL 136 06/28/2015   HDL 32.00 (L) 06/28/2015   LDLCALC 29 02/17/2008   LDLDIRECT 36.0 06/28/2015   TRIG 325.0 (H) 06/28/2015   CHOLHDL 4 06/28/2015  On Rosuvastatin 20 mg daily. Last eye exam was: Component Date Value  . HM Diabetic Eye Exam 10/18/2015 Retinopathy*   He also has OSA and is on CPAP. 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 Dr Rollene Fare saw him in the past for CP >> a cath was clean. He has OSA and wears CPAP every night.   Patient was admitted with chest pain in 01/2015. He had a cardiac cath in 01/2015 >> no stents or other interventions suggested. His cardiologist is Dr. Einar Gip: Conclusion     Prox to Mid LAD lesion, 30% mildly stenosed, but otherwise normal coronary arteries.  The left ventricular systolic function is normal. 30 mL contrast used.   Review of Systems  Constitutional: + weight loss, no fatigue, no subjective hyperthermia/hypothermia ENT: no sore throat, no nodules palpated in throat, no dysphagia/odynophagia, no hoarseness Cardiovascular: no CP/SOB/palpitations/ + B leg swelling Respiratory: no cough/SOB Gastrointestinal: no N/V/D/C Musculoskeletal: no muscle/ joint aches Skin: no rash Neurological: no tremors/numbness/tingling/dizziness + diff with erections  I reviewed pt's medications, allergies, PMH, social hx, family hx, and changes were documented in the history of present illness. Otherwise, unchanged from my initial visit note.   Objective:   Physical Exam BP 132/82 (BP Location: Left Arm, Patient Position: Sitting)   Pulse 71   Ht 6' (1.829 m)   Wt 268 lb (121.6 kg)   SpO2 97%   BMI 36.35 kg/m  Body mass index is 36.35 kg/m.  Wt Readings from Last 3 Encounters:  04/20/16 268 lb (121.6 kg)  02/26/16 272 lb (123.4 kg)  02/19/16 271  lb 6.4 oz (123.1 kg)   Constitutional: obese, in NAD Eyes: PERRLA, EOMI, no exophthalmos ENT: moist mucous membranes, no thyromegaly, no cervical lymphadenopathy Cardiovascular: RRR, No MRG, LE swelling pitting 1+ bilat. Respiratory: CTA B Gastrointestinal: abdomen soft, NT, ND, BS+ Musculoskeletal: no deformities, strength intact in all 4 Skin: moist, warm, stasis dermatitis bilat. 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 DM2 - pt with uncontrolled DM2. He has a history of noncompliance and sugar checks but now returns for this visit, as advised, at 1.5 months.  At last visit HbA1c was very high: 10.1%.  this was an improvement from a previous HbA1c of 11.3%. We increased his NovoLog at last visit, and his sugars are better. His HbA1c is also. better, at 8.9%. We'll increase his mealtime insulin a little more - He is complaining that he is not at checking his sugars as he hates sticking himself. I suggested the new FreeStyle Libre CGM, and he agrees to try it. - At this visit, I suggested to: Patient Instructions  Please continue: - Metformin 1000 mg 2x a day - Levemir 65 units at bedtime  Please increase: - Mealtime insulin ROr NovoLog:  14 >> 16 units before a smaller meal 18-20 >> 22 units before a larger meal 24 >> 26 units before a very large meal  Please check sugars before meals and at bedtime.  Try to get the [place] CGM.  Please return in 3 months with your sugar log.   - given more sugar logs - UTD with eye exams  - given flu shot at last visit - Return to clinic in 3 month with his sugar log  Philemon Kingdom, MD PhD [facility] Endocrinology

## 2016-04-29 ENCOUNTER — Other Ambulatory Visit: Payer: Self-pay | Admitting: Internal Medicine

## 2016-05-21 ENCOUNTER — Other Ambulatory Visit: Payer: Self-pay | Admitting: Internal Medicine

## 2016-06-04 ENCOUNTER — Emergency Department (HOSPITAL_COMMUNITY): Payer: Worker's Compensation

## 2016-06-04 ENCOUNTER — Emergency Department (HOSPITAL_COMMUNITY)
Admission: EM | Admit: 2016-06-04 | Discharge: 2016-06-04 | Disposition: A | Discharge status: Home / Self Care | Payer: Worker's Compensation | Attending: Emergency Medicine | Admitting: Emergency Medicine

## 2016-06-04 ENCOUNTER — Encounter (HOSPITAL_COMMUNITY): Payer: Self-pay

## 2016-06-04 DIAGNOSIS — Z7982 Long term (current) use of aspirin: Secondary | ICD-10-CM | POA: Diagnosis not present

## 2016-06-04 DIAGNOSIS — I129 Hypertensive chronic kidney disease with stage 1 through stage 4 chronic kidney disease, or unspecified chronic kidney disease: Secondary | ICD-10-CM | POA: Diagnosis not present

## 2016-06-04 DIAGNOSIS — Z794 Long term (current) use of insulin: Secondary | ICD-10-CM | POA: Insufficient documentation

## 2016-06-04 DIAGNOSIS — M79671 Pain in right foot: Secondary | ICD-10-CM

## 2016-06-04 DIAGNOSIS — W268XXA Contact with other sharp object(s), not elsewhere classified, initial encounter: Secondary | ICD-10-CM | POA: Insufficient documentation

## 2016-06-04 DIAGNOSIS — N183 Chronic kidney disease, stage 3 (moderate): Secondary | ICD-10-CM | POA: Insufficient documentation

## 2016-06-04 DIAGNOSIS — Y929 Unspecified place or not applicable: Secondary | ICD-10-CM | POA: Diagnosis not present

## 2016-06-04 DIAGNOSIS — Y999 Unspecified external cause status: Secondary | ICD-10-CM | POA: Insufficient documentation

## 2016-06-04 DIAGNOSIS — Y9301 Activity, walking, marching and hiking: Secondary | ICD-10-CM | POA: Diagnosis not present

## 2016-06-04 DIAGNOSIS — Z79899 Other long term (current) drug therapy: Secondary | ICD-10-CM | POA: Insufficient documentation

## 2016-06-04 DIAGNOSIS — S91311A Laceration without foreign body, right foot, initial encounter: Secondary | ICD-10-CM | POA: Insufficient documentation

## 2016-06-04 DIAGNOSIS — E1122 Type 2 diabetes mellitus with diabetic chronic kidney disease: Secondary | ICD-10-CM | POA: Insufficient documentation

## 2016-06-04 DIAGNOSIS — S99921A Unspecified injury of right foot, initial encounter: Secondary | ICD-10-CM | POA: Diagnosis present

## 2016-06-04 MED ORDER — CEPHALEXIN 500 MG PO CAPS: 500.0000 mg | ORAL_CAPSULE | Freq: Four times a day (QID) | ORAL | 0 refills | Status: DC

## 2016-06-04 MED ORDER — ACETAMINOPHEN 500 MG PO TABS
500.0000 mg | ORAL_TABLET | Freq: Once | ORAL | Status: AC
  Administered 2016-06-04: 500 mg via ORAL
  Filled 2016-06-04: qty 1

## 2016-06-04 NOTE — ED Triage Notes (Signed)
Pt. Stepped on a piece of metal and took a piece out last night,  Now he has redness , swelling at the site and is sore.

## 2016-06-04 NOTE — Discharge Instructions (Signed)
There were no abnormalities noted on xray. Pain: May take ibuprofen or naproxen to reduce pain and inflammation. Take these types of medications with food to avoid upset stomach. Choose one of these medications, but not both.  Elevation: Keep the extremity elevated as often as possible to reduce pain and inflammation. Shoe: Wear the post op shoe for support and comfort. Wear this until pain resolves. Antibiotic: Please take all of your antibiotics until finished!   You may develop abdominal discomfort or diarrhea from the antibiotic.  You may help offset this with probiotics which you can buy or get in yogurt. Do not eat or take the probiotics until 2 hours after your antibiotic.  Follow: Follow up with the foot specialist. Call the number provided to set up an appointment.up

## 2016-06-04 NOTE — ED Notes (Signed)
Patient transported to X-ray 
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
## 2016-06-11 ENCOUNTER — Telehealth: Payer: Self-pay

## 2016-06-11 NOTE — Telephone Encounter (Signed)
LVM for pt to call me back and schedule CPE  [facility] c/b # [phone]

## 2016-06-11 NOTE — Telephone Encounter (Signed)
Ebony Hail, can you help me get him scheduled for a regular CPE with Dr Silvio Pate. Thank you

## 2016-06-11 NOTE — Telephone Encounter (Signed)
Called pt to find out how he was doing after his recent ER Trip for foot pain. He said he is doing better.  He said he needs to schedule a CPE with Dr Silvio Pate. Advised him I would send a message to someone to call him to set up a CPE.

## 2016-06-19 ENCOUNTER — Ambulatory Visit: Payer: Self-pay | Admitting: Podiatry

## 2016-06-29 ENCOUNTER — Ambulatory Visit: Payer: 59 | Admitting: Internal Medicine

## 2016-06-30 ENCOUNTER — Other Ambulatory Visit: Payer: Self-pay | Admitting: Internal Medicine

## 2016-07-03 NOTE — Telephone Encounter (Signed)
Scheduled 08/07/16

## 2016-07-03 NOTE — Telephone Encounter (Signed)
Awesome! Thank you!

## 2016-07-06 ENCOUNTER — Other Ambulatory Visit: Payer: Self-pay | Admitting: Internal Medicine

## 2016-07-07 ENCOUNTER — Other Ambulatory Visit: Payer: Self-pay | Admitting: Internal Medicine

## 2016-07-08 DIAGNOSIS — G4733 Obstructive sleep apnea (adult) (pediatric): Secondary | ICD-10-CM | POA: Diagnosis not present

## 2016-07-11 ENCOUNTER — Other Ambulatory Visit: Payer: Self-pay | Admitting: Internal Medicine

## 2016-07-20 ENCOUNTER — Other Ambulatory Visit: Payer: Self-pay | Admitting: Internal Medicine

## 2016-07-20 DIAGNOSIS — E1122 Type 2 diabetes mellitus with diabetic chronic kidney disease: Secondary | ICD-10-CM

## 2016-07-20 DIAGNOSIS — Z794 Long term (current) use of insulin: Principal | ICD-10-CM

## 2016-07-20 DIAGNOSIS — N183 Chronic kidney disease, stage 3 (moderate): Principal | ICD-10-CM

## 2016-07-20 DIAGNOSIS — E1165 Type 2 diabetes mellitus with hyperglycemia: Principal | ICD-10-CM

## 2016-07-20 DIAGNOSIS — IMO0002 Reserved for concepts with insufficient information to code with codable children: Secondary | ICD-10-CM

## 2016-07-31 ENCOUNTER — Other Ambulatory Visit: Payer: Self-pay | Admitting: Internal Medicine

## 2016-08-07 ENCOUNTER — Encounter: Payer: Self-pay | Admitting: Internal Medicine

## 2016-08-07 ENCOUNTER — Ambulatory Visit (INDEPENDENT_AMBULATORY_CARE_PROVIDER_SITE_OTHER): Payer: 59 | Admitting: Internal Medicine

## 2016-08-07 VITALS — BP 130/66 | HR 65 | Temp 98.7°F | Ht 71.5 in | Wt 261.0 lb

## 2016-08-07 DIAGNOSIS — I10 Essential (primary) hypertension: Secondary | ICD-10-CM

## 2016-08-07 DIAGNOSIS — Z125 Encounter for screening for malignant neoplasm of prostate: Secondary | ICD-10-CM

## 2016-08-07 DIAGNOSIS — E1165 Type 2 diabetes mellitus with hyperglycemia: Secondary | ICD-10-CM

## 2016-08-07 DIAGNOSIS — Z0000 Encounter for general adult medical examination without abnormal findings: Secondary | ICD-10-CM | POA: Diagnosis not present

## 2016-08-07 DIAGNOSIS — E1122 Type 2 diabetes mellitus with diabetic chronic kidney disease: Secondary | ICD-10-CM

## 2016-08-07 DIAGNOSIS — Z794 Long term (current) use of insulin: Secondary | ICD-10-CM | POA: Diagnosis not present

## 2016-08-07 DIAGNOSIS — IMO0002 Reserved for concepts with insufficient information to code with codable children: Secondary | ICD-10-CM

## 2016-08-07 DIAGNOSIS — N183 Chronic kidney disease, stage 3 unspecified: Secondary | ICD-10-CM

## 2016-08-07 DIAGNOSIS — Z23 Encounter for immunization: Secondary | ICD-10-CM | POA: Diagnosis not present

## 2016-08-07 LAB — CBC WITH DIFFERENTIAL/PLATELET
BASOS ABS: 0.1 10*3/uL (ref 0.0–0.1)
Basophils Relative: 1.1 % (ref 0.0–3.0)
Eosinophils Absolute: 0.4 10*3/uL (ref 0.0–0.7)
Eosinophils Relative: 4.3 % (ref 0.0–5.0)
HCT: 37.6 % — ABNORMAL LOW (ref 39.0–52.0)
Hemoglobin: 12.6 g/dL — ABNORMAL LOW (ref 13.0–17.0)
LYMPHS ABS: 1.7 10*3/uL (ref 0.7–4.0)
Lymphocytes Relative: 19.4 % (ref 12.0–46.0)
MCHC: 33.5 g/dL (ref 30.0–36.0)
MCV: 81.6 fl (ref 78.0–100.0)
MONOS PCT: 5.5 % (ref 3.0–12.0)
Monocytes Absolute: 0.5 10*3/uL (ref 0.1–1.0)
NEUTROS ABS: 6.2 10*3/uL (ref 1.4–7.7)
NEUTROS PCT: 69.7 % (ref 43.0–77.0)
PLATELETS: 218 10*3/uL (ref 150.0–400.0)
RBC: 4.61 Mil/uL (ref 4.22–5.81)
RDW: 14.7 % (ref 11.5–15.5)
WBC: 8.9 10*3/uL (ref 4.0–10.5)

## 2016-08-07 LAB — COMPREHENSIVE METABOLIC PANEL
ALT: 21 U/L (ref 0–53)
AST: 19 U/L (ref 0–37)
Albumin: 4.2 g/dL (ref 3.5–5.2)
Alkaline Phosphatase: 65 U/L (ref 39–117)
BILIRUBIN TOTAL: 0.9 mg/dL (ref 0.2–1.2)
BUN: 59 mg/dL — ABNORMAL HIGH (ref 6–23)
CO2: 26 meq/L (ref 19–32)
Calcium: 9.8 mg/dL (ref 8.4–10.5)
Chloride: 107 mEq/L (ref 96–112)
Creatinine, Ser: 2.78 mg/dL — ABNORMAL HIGH (ref 0.40–1.50)
GFR: 25.33 mL/min — AB (ref >60.00)
GLUCOSE: 146 mg/dL — AB (ref 70–99)
Potassium: 4.4 mEq/L (ref 3.5–5.1)
SODIUM: 144 meq/L (ref 135–145)
Total Protein: 6.5 g/dL (ref 6.0–8.3)

## 2016-08-07 LAB — HM DIABETES FOOT EXAM

## 2016-08-07 LAB — HEMOGLOBIN A1C: Hgb A1c MFr Bld: 8.5 % — ABNORMAL HIGH (ref 4.6–6.5)

## 2016-08-07 LAB — LIPID PANEL
Cholesterol: 80 mg/dL (ref 0–200)
HDL: 29.6 mg/dL — ABNORMAL LOW (ref >39.00)
LDL Cholesterol: 23 mg/dL (ref 0–99)
NONHDL: 50.89
TRIGLYCERIDES: 141 mg/dL (ref 0.0–149.0)
Total CHOL/HDL Ratio: 3
VLDL: 28.2 mg/dL (ref 0.0–40.0)

## 2016-08-07 LAB — PSA: PSA: 0.93 ng/mL (ref 0.10–4.00)

## 2016-08-07 LAB — T4, FREE: Free T4: 0.82 ng/dL (ref 0.60–1.60)
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
## 2016-08-07 NOTE — Assessment & Plan Note (Signed)
BP Readings from Last 3 Encounters:  08/07/16 130/66  06/04/16 (!) 143/65  04/20/16 132/82   Good control

## 2016-08-07 NOTE — Addendum Note (Signed)
Addended by: Pilar Grammes on: 08/07/2016 12:57 PM   Modules accepted: Orders

## 2016-08-07 NOTE — Assessment & Plan Note (Signed)
On ACEI Sees nephrologist

## 2016-08-07 NOTE — Assessment & Plan Note (Signed)
Much better Working on lifestyle Continues with Dr Cruzita Lederer

## 2016-08-07 NOTE — Assessment & Plan Note (Signed)
Doing better with lifestyle Colon due 2021 Discussed PSA--- we will check Yearly flu vaccine prevnar today

## 2016-08-10 ENCOUNTER — Other Ambulatory Visit: Payer: Self-pay | Admitting: Internal Medicine

## 2016-08-11 ENCOUNTER — Other Ambulatory Visit: Payer: Self-pay | Admitting: Internal Medicine

## 2016-08-19 ENCOUNTER — Encounter: Payer: Self-pay | Admitting: Family Medicine

## 2016-08-19 ENCOUNTER — Ambulatory Visit (INDEPENDENT_AMBULATORY_CARE_PROVIDER_SITE_OTHER): Payer: 59 | Admitting: Family Medicine

## 2016-08-19 DIAGNOSIS — K5900 Constipation, unspecified: Secondary | ICD-10-CM

## 2016-08-19 DIAGNOSIS — IMO0002 Reserved for concepts with insufficient information to code with codable children: Secondary | ICD-10-CM

## 2016-08-19 DIAGNOSIS — E1165 Type 2 diabetes mellitus with hyperglycemia: Secondary | ICD-10-CM

## 2016-08-19 DIAGNOSIS — N183 Chronic kidney disease, stage 3 (moderate): Secondary | ICD-10-CM | POA: Diagnosis not present

## 2016-08-19 DIAGNOSIS — E1122 Type 2 diabetes mellitus with diabetic chronic kidney disease: Secondary | ICD-10-CM

## 2016-08-19 DIAGNOSIS — Z794 Long term (current) use of insulin: Secondary | ICD-10-CM

## 2016-08-19 NOTE — Progress Notes (Signed)
Constipation.  In the last week he had several days w/o a BM.  This was a recent change.  He used MOM on the meantime, then passed a hard stool, then softer stools.  Then 3-4 days later, he had return of similar sx.  He used more MOM (this was about 4 days ago), with relief.  He had to use MOM again yesterday with BM this AM.  He didn't have severe pain but felt bloated prior to BMs.  No new meds  He is working on his diet, exercising more and walking more.  He has intentional weight loss. No blood in stool.  No black stools.  No vomiting.  No fevers.  He doesn't feel unwell.    He has been getting some lower sugar readings with weight loss.  His sugar was 69 yesterday AM and he has had several lows recently.    Meds, vitals, and allergies reviewed.   ROS: Per HPI unless specifically indicated in ROS section   GEN: nad, alert and oriented HEENT: mucous membranes moist NECK: supple w/o LA CV: rrr PULM: ctab, no inc wob ABD: soft, +bs EXT: no edema SKIN: no acute rash

## 2016-08-19 NOTE — Patient Instructions (Signed)
If you have a fasting AM sugar below 80 then cut back 1 unit on your next dose of levemir.  Stop MOM and change to daily (if needed) miralax.  You can take it daily but you may only need it a few times a week or less.  Take care.  Glad to see you.  Update [place] as needed.

## 2016-08-20 DIAGNOSIS — K5900 Constipation, unspecified: Secondary | ICD-10-CM | POA: Insufficient documentation

## 2016-08-20 NOTE — Assessment & Plan Note (Signed)
Reasonable to stop milk of magnesia and change over to MiraLAX. He can take daily if needed. He may only need a few times a week, example Monday Wednesday Friday, or maybe just when necessary dosing. Discussed with patient. He has significant intentional weight loss. Unclear if the constipation is related to overall changes in diet. Unclear if some of his medicines such as the beta blocker could be contributing. Discussed with patient. He will update [place] as needed. No alarming symptoms. Colonoscopy up-to-date. No reason to suspect ominous diagnosis.

## 2016-08-20 NOTE — Assessment & Plan Note (Signed)
Likely okay to slowly taper his Levemir by 1 unit per day depending on his morning sugar readings. See after visit summary. Discussed with patient. He agrees.

## 2016-08-23 ENCOUNTER — Encounter: Payer: Self-pay | Admitting: Family Medicine

## 2016-08-23 ENCOUNTER — Encounter: Payer: Self-pay | Admitting: Internal Medicine

## 2016-08-24 ENCOUNTER — Telehealth: Payer: Self-pay | Admitting: *Deleted

## 2016-08-24 NOTE — Telephone Encounter (Signed)
Patient left a voicemail stating that he is still having problems with constipation and wants to know if he needs to come back in for this?

## 2016-08-25 NOTE — Telephone Encounter (Signed)
I would take a double dose of MiraLAX daily to see if that helps. He can add on Senokot if he has not already done so. If still having trouble then let us know. Routed to PCP as FYI. Thanks.

## 2016-08-25 NOTE — Telephone Encounter (Signed)
Per Dr Alla German note 08-24-16: The miralax doesn't work right away. You need to take it regularly and eventually it will work. Once a day may not be enough though--you might want to increase it to twice a day. Sometimes, even that is not enough and you can use a stimulant like milk of magnesia, dulcolax or an enema (like every 2-3 days if you don't go with just the miralax). I also recommend adding senna-s, 2 tabs once or twice a day, to the miralax as the combination will often work better and keep you from needing to use a stimulant.   Spoke to pt. He said he did the above and it worked!!! 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

## 2016-08-25 NOTE — Telephone Encounter (Signed)
Look at my other note with instructions (might have been email) and confirm with patient

## 2016-08-27 ENCOUNTER — Other Ambulatory Visit: Payer: Self-pay | Admitting: Internal Medicine

## 2016-08-28 ENCOUNTER — Other Ambulatory Visit: Payer: Self-pay | Admitting: Internal Medicine

## 2016-09-02 ENCOUNTER — Ambulatory Visit (INDEPENDENT_AMBULATORY_CARE_PROVIDER_SITE_OTHER): Payer: 59 | Admitting: Family Medicine

## 2016-09-02 ENCOUNTER — Ambulatory Visit (INDEPENDENT_AMBULATORY_CARE_PROVIDER_SITE_OTHER)
Admission: RE | Admit: 2016-09-02 | Discharge: 2016-09-02 | Disposition: A | Discharge status: Home / Self Care | Payer: 59 | Source: Ambulatory Visit | Attending: Family Medicine | Admitting: Family Medicine

## 2016-09-02 ENCOUNTER — Ambulatory Visit (INDEPENDENT_AMBULATORY_CARE_PROVIDER_SITE_OTHER): Payer: 59 | Admitting: Orthopedic Surgery

## 2016-09-02 ENCOUNTER — Encounter (INDEPENDENT_AMBULATORY_CARE_PROVIDER_SITE_OTHER): Payer: Self-pay | Admitting: Family

## 2016-09-02 ENCOUNTER — Encounter: Payer: Self-pay | Admitting: Family Medicine

## 2016-09-02 VITALS — Ht 71.5 in | Wt 250.0 lb

## 2016-09-02 VITALS — BP 160/72 | HR 86 | Ht 71.5 in | Wt 250.0 lb

## 2016-09-02 DIAGNOSIS — S99922A Unspecified injury of left foot, initial encounter: Secondary | ICD-10-CM | POA: Diagnosis not present

## 2016-09-02 DIAGNOSIS — S92902A Unspecified fracture of left foot, initial encounter for closed fracture: Secondary | ICD-10-CM

## 2016-09-02 DIAGNOSIS — S99192A Other physeal fracture of left metatarsal, initial encounter for closed fracture: Secondary | ICD-10-CM | POA: Diagnosis not present

## 2016-09-02 DIAGNOSIS — M79672 Pain in left foot: Secondary | ICD-10-CM | POA: Diagnosis not present

## 2016-09-02 LAB — URIC ACID: URIC ACID, SERUM: 8.2 mg/dL — AB (ref 4.0–7.8)

## 2016-09-02 NOTE — Patient Instructions (Signed)
Great to meet you. I will call you with your results from today.
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
## 2016-09-02 NOTE — Addendum Note (Signed)
Addended by: Lucille Passy on: 09/02/2016 09:23 AM   Modules accepted: Orders

## 2016-09-03 ENCOUNTER — Other Ambulatory Visit: Payer: Self-pay | Admitting: Internal Medicine
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
## 2016-09-04 MED ORDER — FLUTICASONE PROPIONATE 50 MCG/ACT NA SUSP: 1.0000 | Freq: Two times a day (BID) | NASAL | 11 refills | Status: DC

## 2016-09-04 NOTE — Addendum Note (Signed)
Addended by: Pilar Grammes on: 09/04/2016 04:07 PM   Modules accepted: Orders

## 2016-09-08 ENCOUNTER — Other Ambulatory Visit: Payer: Self-pay | Admitting: Internal Medicine

## 2016-09-22 ENCOUNTER — Other Ambulatory Visit: Payer: Self-pay | Admitting: Internal Medicine

## 2016-09-29 ENCOUNTER — Encounter: Payer: Self-pay | Admitting: Internal Medicine

## 2016-10-01 ENCOUNTER — Ambulatory Visit (INDEPENDENT_AMBULATORY_CARE_PROVIDER_SITE_OTHER): Payer: 59 | Admitting: Orthopedic Surgery

## 2016-10-01 ENCOUNTER — Telehealth (INDEPENDENT_AMBULATORY_CARE_PROVIDER_SITE_OTHER): Payer: Self-pay | Admitting: Orthopedic Surgery

## 2016-10-01 ENCOUNTER — Ambulatory Visit (INDEPENDENT_AMBULATORY_CARE_PROVIDER_SITE_OTHER): Payer: 59

## 2016-10-01 ENCOUNTER — Encounter (INDEPENDENT_AMBULATORY_CARE_PROVIDER_SITE_OTHER): Payer: Self-pay | Admitting: Orthopedic Surgery

## 2016-10-01 DIAGNOSIS — S99192D Other physeal fracture of left metatarsal, subsequent encounter for fracture with routine healing: Secondary | ICD-10-CM

## 2016-10-01 NOTE — Telephone Encounter (Signed)
Patient called advised the note he received this morning states he is out of work  For 3 wks but would like to be on light duty.  fax # [phone] Attn: Alfred Levins
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
## 2016-10-02 NOTE — Telephone Encounter (Signed)
Note for seated light duty work faxed to Santiago Glad at [phone]

## 2016-10-04 ENCOUNTER — Other Ambulatory Visit: Payer: Self-pay | Admitting: Internal Medicine

## 2016-10-05 ENCOUNTER — Ambulatory Visit (INDEPENDENT_AMBULATORY_CARE_PROVIDER_SITE_OTHER): Payer: Self-pay | Admitting: Internal Medicine

## 2016-10-05 ENCOUNTER — Encounter: Payer: Self-pay | Admitting: Internal Medicine

## 2016-10-05 VITALS — BP 142/80 | HR 74 | Wt 255.0 lb

## 2016-10-05 DIAGNOSIS — N183 Chronic kidney disease, stage 3 (moderate): Secondary | ICD-10-CM

## 2016-10-05 DIAGNOSIS — E669 Obesity, unspecified: Secondary | ICD-10-CM

## 2016-10-05 DIAGNOSIS — E1165 Type 2 diabetes mellitus with hyperglycemia: Secondary | ICD-10-CM

## 2016-10-05 DIAGNOSIS — Z23 Encounter for immunization: Secondary | ICD-10-CM

## 2016-10-05 DIAGNOSIS — E1122 Type 2 diabetes mellitus with diabetic chronic kidney disease: Secondary | ICD-10-CM

## 2016-10-05 DIAGNOSIS — IMO0002 Reserved for concepts with insufficient information to code with codable children: Secondary | ICD-10-CM

## 2016-10-05 DIAGNOSIS — Z6834 Body mass index (BMI) 34.0-34.9, adult: Secondary | ICD-10-CM

## 2016-10-05 DIAGNOSIS — Z794 Long term (current) use of insulin: Secondary | ICD-10-CM

## 2016-10-05 NOTE — Patient Instructions (Addendum)
Please continue: - Levemir 50 units at bedtime - Mealtime insulin 12-20 units before meals.  Please return in 3 months with your sugar log.

## 2016-10-05 NOTE — Progress Notes (Signed)
Subjective:     Patient ID: [name], male   DOB: 03-Jul-1960, 56 y.o.   MRN: [id number]  Diabetes    Mr Treanor is a 56 y.o. man returning for management of DM2, dx 2003, insulin-dependent, uncontrolled, with complications (nephropathy; retinopathy OU; neuropathy). Last visit was 6 mo ago.  He had a foot fracture 5 weeks ago >> healing.  He started a CGM [employer]) >> did not have accurate readings, but would like to try another sensor.  Last HbA1C: Lab Results  Component Value Date   HGBA1C 8.5 (H) 08/07/2016   HGBA1C 8.9 04/20/2016   HGBA1C 10.1 10/31/2015   He is on a regimen of:  stopped 07/2016 b/c high Cr - Levemir 65 >> 50 units at bedtime - Mealtime insulin 12-20 units before the meal We stopped Tradjenta and Glipizide XL in 10/2013.  He brings log: - A.m.: 130-180, 310 (snacked at night) >> 159, 205 >> 122-160 (if late meal) >> n/c - after b'fast: 113-325 >> n/c >> 110-130 - before lunch: 179, 198 >> 150s >> 190, 210 >> 115-120 >> 69 (skipped b'fast), 100-150 - 2h after lunch: 125-225 >> n/c >> 208-303 >> n/c - Before dinner: 191, 195, 255 >> 130s >> 153, 157 >> n/c >> 100-130 - Nighttime:  150-175 >> n/c >> highest, up to 200s >> 100-150s He has hypoglycemia awareness, but unclear at what sugar level.  He changed his diet >> more fruit and veggies.   + CKD: Lab Results  Component Value Date   BUN 59 (H) 08/07/2016   CREATININE 2.78 (H) 08/07/2016  He is on lisinopril.  08/07/2016: GFR: 25.33 He sees nephrology now. Lab Results  Component Value Date   GFRNONAA 42 (L) 06/28/2015   GFRNONAA 41 (L) 02/12/2015   GFRNONAA 83 (L) 04/15/2012   GFRNONAA 80.01 11/13/2009   GFRNONAA 86 03/19/2008   GFRNONAA 77 02/17/2008   GFRNONAA 86 01/05/2008   GFRNONAA 58 09/15/2007   He has MAU: Lab Results  Component Value Date   MICRALBCREAT 143.5 (H) 06/26/2011   MICRALBCREAT 111.2 (H) 05/07/2010   MICRALBCREAT 856.1 (H) 08/06/2008   He also has  HyperTG-emia: Lab Results  Component Value Date   CHOL 80 08/07/2016   HDL 29.60 (L) 08/07/2016   LDLCALC 23 08/07/2016   LDLDIRECT 36.0 06/28/2015   TRIG 141.0 08/07/2016   CHOLHDL 3 08/07/2016  On Crestor 20. Last eye exam was: 12/2015 >> + DR  He also has OSA and is on CPAP. 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 Dr Rollene Fare saw him in the past for CP >> a cath was clean. He has OSA and wears CPAP every night.  Patient was admitted with chest pain in 01/2015. He had a cardiac cath in 01/2015 >> no stents or other interventions suggested. His cardiologist is Dr. Einar Gip: Conclusion     Prox to Mid LAD lesion, 30% mildly stenosed, but otherwise normal coronary arteries.  The left ventricular systolic function is normal. 30 mL contrast used.   Review of Systems  Constitutional: + weight loss (lost >13 lbs since last visit - intentional), no fatigue, no subjective hyperthermia, no subjective hypothermia Eyes: no blurry vision, no xerophthalmia ENT: no sore throat, no nodules palpated in throat, no dysphagia, no odynophagia, no hoarseness Cardiovascular: no CP/no SOB/no palpitations/+ leg swelling Respiratory: no cough/no SOB/no wheezing Gastrointestinal: no N/no V/no D/+ C/no acid reflux Musculoskeletal: no muscle aches/no joint aches Skin: no rashes, no hair  loss Neurological: no tremors/no numbness/no tingling/no dizziness  I reviewed pt's medications, allergies, PMH, social hx, family hx, and changes were documented in the history of present illness. Otherwise, unchanged from my initial visit note.   Objective:   Physical Exam BP (!) 142/80 (BP Location: Left Arm, Patient Position: Sitting)   Pulse 74   Wt 255 lb (115.7 kg)   SpO2 97%   BMI 35.07 kg/m  Body mass index is 35.07 kg/m.  Wt Readings from Last 3 Encounters:  10/05/16 255 lb (115.7 kg)  09/02/16 250 lb (113.4 kg)  09/02/16 250 lb (113.4 kg)   Constitutional: overweight, in NAD Eyes: PERRLA, EOMI,  no exophthalmos ENT: moist mucous membranes, no thyromegaly, no cervical lymphadenopathy Cardiovascular: RRR, No MRG, + LE swelling +1 pitting edema, L foot in boot Respiratory: CTA B Gastrointestinal: abdomen soft, NT, ND, BS+ Musculoskeletal: no deformities, strength intact in all 4 Skin: moist, warm, stasis dermatitis bilat.  Neurological: no tremor with outstretched hands, DTR normal in all 4  Assessment:     1. 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 Obesity class 1 (has boot on now) BMI Classification:  < 18.5 underweight   18.5-24.9 normal weight   25.0-29.9 overweight   30.0-34.9 class I obesity   35.0-39.9 class II obesity   ? 40.0 class III obesity      Plan:  1. DM2 - pt with uncontrolled Type 2 diabetes, with improved blood sugar control since last visit despite his fracture and decreased activity afterwards. He was able to decrease his insulin doses after he started to improve his diet to more plant-based one we will not change the regimen for now as his sugars are close to goal. After he restarts exercise, we discussed that he may be able to reduce  his insulin doses even more. - We reviewed together his latest HbA1c, which has decreased further, to 8.5%. - At this visit, I suggested to: Patient Instructions  Please continue: - Levemir 50 units at bedtime - Mealtime insulin 12-20 units before meals.  Please return in 3 months with your sugar log.   - continue checking sugars at different times of the day - check 3x a day, rotating checks - advised for yearly eye exams >> he is UTD - Return to clinic in 3 mo with sugar log   2. Obesity class 1 - he started to lose weight intentionally after he changed his diet to a more plant-based one. - I congratulated  him and discussed about making further improvements. I suggested to cut out cheese Especially since it he is complaining of constipation  - His cast comes off soon and he is ready to restart walking, which she believes helped his diabetes a lot   Philemon Kingdom, MD PhD [facility]

## 2016-10-09 DIAGNOSIS — G4733 Obstructive sleep apnea (adult) (pediatric): Secondary | ICD-10-CM | POA: Diagnosis not present

## 2016-10-12 ENCOUNTER — Other Ambulatory Visit: Payer: Self-pay | Admitting: Internal Medicine

## 2016-10-12 DIAGNOSIS — E1129 Type 2 diabetes mellitus with other diabetic kidney complication: Secondary | ICD-10-CM | POA: Diagnosis not present

## 2016-10-12 DIAGNOSIS — I10 Essential (primary) hypertension: Secondary | ICD-10-CM | POA: Diagnosis not present

## 2016-10-12 DIAGNOSIS — E1165 Type 2 diabetes mellitus with hyperglycemia: Principal | ICD-10-CM

## 2016-10-12 DIAGNOSIS — N183 Chronic kidney disease, stage 3 (moderate): Principal | ICD-10-CM

## 2016-10-12 DIAGNOSIS — N184 Chronic kidney disease, stage 4 (severe): Secondary | ICD-10-CM | POA: Diagnosis not present

## 2016-10-12 DIAGNOSIS — Z794 Long term (current) use of insulin: Principal | ICD-10-CM

## 2016-10-12 DIAGNOSIS — E1122 Type 2 diabetes mellitus with diabetic chronic kidney disease: Secondary | ICD-10-CM

## 2016-10-12 DIAGNOSIS — IMO0002 Reserved for concepts with insufficient information to code with codable children: Secondary | ICD-10-CM

## 2016-10-22 ENCOUNTER — Ambulatory Visit (INDEPENDENT_AMBULATORY_CARE_PROVIDER_SITE_OTHER): Payer: 59

## 2016-10-22 ENCOUNTER — Encounter (INDEPENDENT_AMBULATORY_CARE_PROVIDER_SITE_OTHER): Payer: Self-pay | Admitting: Orthopedic Surgery

## 2016-10-22 ENCOUNTER — Ambulatory Visit (INDEPENDENT_AMBULATORY_CARE_PROVIDER_SITE_OTHER): Payer: 59 | Admitting: Orthopedic Surgery

## 2016-10-22 DIAGNOSIS — M79672 Pain in left foot: Secondary | ICD-10-CM
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
## 2016-11-02 ENCOUNTER — Other Ambulatory Visit: Payer: Self-pay | Admitting: Internal Medicine

## 2016-11-06 DIAGNOSIS — H524 Presbyopia: Secondary | ICD-10-CM | POA: Diagnosis not present

## 2016-11-06 DIAGNOSIS — H52223 Regular astigmatism, bilateral: Secondary | ICD-10-CM | POA: Diagnosis not present

## 2016-11-06 DIAGNOSIS — E113393 Type 2 diabetes mellitus with moderate nonproliferative diabetic retinopathy without macular edema, bilateral: Secondary | ICD-10-CM | POA: Diagnosis not present

## 2016-11-06 DIAGNOSIS — H25043 Posterior subcapsular polar age-related cataract, bilateral: Secondary | ICD-10-CM | POA: Diagnosis not present

## 2016-11-06 LAB — HM DIABETES EYE EXAM

## 2016-12-03 ENCOUNTER — Encounter: Payer: Self-pay | Admitting: Internal Medicine

## 2016-12-03 DIAGNOSIS — G4733 Obstructive sleep apnea (adult) (pediatric): Secondary | ICD-10-CM | POA: Diagnosis not present

## 2016-12-14 ENCOUNTER — Ambulatory Visit
Admission: RE | Admit: 2016-12-14 | Discharge: 2016-12-14 | Disposition: A | Discharge status: Home / Self Care | Payer: 59 | Source: Ambulatory Visit | Attending: Nephrology | Admitting: Nephrology

## 2016-12-14 ENCOUNTER — Encounter: Payer: Self-pay | Admitting: Internal Medicine

## 2016-12-14 ENCOUNTER — Other Ambulatory Visit: Payer: Self-pay | Admitting: Nephrology

## 2016-12-14 DIAGNOSIS — R05 Cough: Secondary | ICD-10-CM | POA: Insufficient documentation

## 2016-12-14 DIAGNOSIS — I10 Essential (primary) hypertension: Secondary | ICD-10-CM | POA: Diagnosis not present

## 2016-12-14 DIAGNOSIS — I517 Cardiomegaly: Secondary | ICD-10-CM | POA: Diagnosis not present

## 2016-12-14 DIAGNOSIS — J90 Pleural effusion, not elsewhere classified: Secondary | ICD-10-CM | POA: Diagnosis not present

## 2016-12-14 DIAGNOSIS — R059 Cough, unspecified: Secondary | ICD-10-CM

## 2016-12-14 DIAGNOSIS — N184 Chronic kidney disease, stage 4 (severe): Secondary | ICD-10-CM | POA: Diagnosis not present

## 2016-12-14 DIAGNOSIS — R609 Edema, unspecified: Secondary | ICD-10-CM | POA: Diagnosis not present

## 2016-12-28 ENCOUNTER — Other Ambulatory Visit: Payer: Self-pay | Admitting: Internal Medicine

## 2016-12-30 DIAGNOSIS — N183 Chronic kidney disease, stage 3 (moderate): Secondary | ICD-10-CM | POA: Diagnosis not present

## 2016-12-30 DIAGNOSIS — I10 Essential (primary) hypertension: Secondary | ICD-10-CM | POA: Diagnosis not present

## 2016-12-30 DIAGNOSIS — E1129 Type 2 diabetes mellitus with other diabetic kidney complication: Secondary | ICD-10-CM | POA: Diagnosis not present

## 2017-01-05 ENCOUNTER — Ambulatory Visit: Payer: Self-pay | Admitting: Internal Medicine

## 2017-01-12 ENCOUNTER — Other Ambulatory Visit: Payer: Self-pay | Admitting: Internal Medicine

## 2017-01-12 DIAGNOSIS — G4733 Obstructive sleep apnea (adult) (pediatric): Secondary | ICD-10-CM | POA: Diagnosis not present

## 2017-01-13 ENCOUNTER — Ambulatory Visit (INDEPENDENT_AMBULATORY_CARE_PROVIDER_SITE_OTHER): Payer: 59

## 2017-01-13 ENCOUNTER — Other Ambulatory Visit: Payer: Self-pay

## 2017-01-13 ENCOUNTER — Ambulatory Visit: Payer: Self-pay

## 2017-01-13 ENCOUNTER — Ambulatory Visit: Payer: 59 | Admitting: Emergency Medicine

## 2017-01-13 ENCOUNTER — Encounter: Payer: Self-pay | Admitting: Emergency Medicine

## 2017-01-13 ENCOUNTER — Inpatient Hospital Stay (HOSPITAL_COMMUNITY)
Admission: EM | Admit: 2017-01-13 | Discharge: 2017-01-15 | DRG: 638 | Disposition: A | Discharge status: Home / Self Care | Payer: 59 | Attending: Internal Medicine | Admitting: Internal Medicine

## 2017-01-13 ENCOUNTER — Encounter (HOSPITAL_COMMUNITY): Payer: Self-pay | Admitting: Emergency Medicine

## 2017-01-13 VITALS — BP 106/60 | HR 71 | Temp 99.9°F | Resp 16 | Ht 71.75 in | Wt 246.2 lb

## 2017-01-13 DIAGNOSIS — E1122 Type 2 diabetes mellitus with diabetic chronic kidney disease: Secondary | ICD-10-CM

## 2017-01-13 DIAGNOSIS — D638 Anemia in other chronic diseases classified elsewhere: Secondary | ICD-10-CM | POA: Diagnosis present

## 2017-01-13 DIAGNOSIS — Z7982 Long term (current) use of aspirin: Secondary | ICD-10-CM | POA: Diagnosis not present

## 2017-01-13 DIAGNOSIS — IMO0002 Reserved for concepts with insufficient information to code with codable children: Secondary | ICD-10-CM

## 2017-01-13 DIAGNOSIS — L97529 Non-pressure chronic ulcer of other part of left foot with unspecified severity: Secondary | ICD-10-CM | POA: Diagnosis present

## 2017-01-13 DIAGNOSIS — M79672 Pain in left foot: Secondary | ICD-10-CM | POA: Diagnosis not present

## 2017-01-13 DIAGNOSIS — D649 Anemia, unspecified: Secondary | ICD-10-CM

## 2017-01-13 DIAGNOSIS — N186 End stage renal disease: Secondary | ICD-10-CM | POA: Diagnosis present

## 2017-01-13 DIAGNOSIS — L0390 Cellulitis, unspecified: Secondary | ICD-10-CM | POA: Diagnosis not present

## 2017-01-13 DIAGNOSIS — G4733 Obstructive sleep apnea (adult) (pediatric): Secondary | ICD-10-CM | POA: Diagnosis present

## 2017-01-13 DIAGNOSIS — L089 Local infection of the skin and subcutaneous tissue, unspecified: Secondary | ICD-10-CM | POA: Diagnosis not present

## 2017-01-13 DIAGNOSIS — Z833 Family history of diabetes mellitus: Secondary | ICD-10-CM

## 2017-01-13 DIAGNOSIS — I129 Hypertensive chronic kidney disease with stage 1 through stage 4 chronic kidney disease, or unspecified chronic kidney disease: Secondary | ICD-10-CM | POA: Diagnosis present

## 2017-01-13 DIAGNOSIS — Z87891 Personal history of nicotine dependence: Secondary | ICD-10-CM | POA: Diagnosis not present

## 2017-01-13 DIAGNOSIS — J22 Unspecified acute lower respiratory infection: Secondary | ICD-10-CM

## 2017-01-13 DIAGNOSIS — Z794 Long term (current) use of insulin: Secondary | ICD-10-CM

## 2017-01-13 DIAGNOSIS — N183 Chronic kidney disease, stage 3 (moderate): Secondary | ICD-10-CM | POA: Diagnosis not present

## 2017-01-13 DIAGNOSIS — E11628 Type 2 diabetes mellitus with other skin complications: Secondary | ICD-10-CM | POA: Diagnosis present

## 2017-01-13 DIAGNOSIS — Z7951 Long term (current) use of inhaled steroids: Secondary | ICD-10-CM

## 2017-01-13 DIAGNOSIS — L03116 Cellulitis of left lower limb: Secondary | ICD-10-CM | POA: Diagnosis present

## 2017-01-13 DIAGNOSIS — E11621 Type 2 diabetes mellitus with foot ulcer: Principal | ICD-10-CM | POA: Diagnosis present

## 2017-01-13 DIAGNOSIS — Z79899 Other long term (current) drug therapy: Secondary | ICD-10-CM

## 2017-01-13 DIAGNOSIS — E1142 Type 2 diabetes mellitus with diabetic polyneuropathy: Secondary | ICD-10-CM | POA: Diagnosis not present

## 2017-01-13 DIAGNOSIS — E876 Hypokalemia: Secondary | ICD-10-CM | POA: Diagnosis present

## 2017-01-13 DIAGNOSIS — N184 Chronic kidney disease, stage 4 (severe): Secondary | ICD-10-CM | POA: Diagnosis present

## 2017-01-13 DIAGNOSIS — R0602 Shortness of breath: Secondary | ICD-10-CM | POA: Diagnosis not present

## 2017-01-13 DIAGNOSIS — E1165 Type 2 diabetes mellitus with hyperglycemia: Secondary | ICD-10-CM | POA: Diagnosis not present

## 2017-01-13 DIAGNOSIS — E119 Type 2 diabetes mellitus without complications: Secondary | ICD-10-CM

## 2017-01-13 DIAGNOSIS — M7989 Other specified soft tissue disorders: Secondary | ICD-10-CM | POA: Diagnosis not present

## 2017-01-13 DIAGNOSIS — E785 Hyperlipidemia, unspecified: Secondary | ICD-10-CM | POA: Diagnosis present

## 2017-01-13 DIAGNOSIS — I10 Essential (primary) hypertension: Secondary | ICD-10-CM | POA: Diagnosis not present

## 2017-01-13 DIAGNOSIS — N185 Chronic kidney disease, stage 5: Secondary | ICD-10-CM | POA: Diagnosis present

## 2017-01-13 DIAGNOSIS — R509 Fever, unspecified: Secondary | ICD-10-CM | POA: Diagnosis not present

## 2017-01-13 HISTORY — DX: Chronic kidney disease, stage 3 (moderate): N18.3

## 2017-01-13 HISTORY — DX: Chronic kidney disease, stage 3 unspecified: N18.30

## 2017-01-13 LAB — CBC WITH DIFFERENTIAL/PLATELET
Basophils Absolute: 0 10*3/uL (ref 0.0–0.1)
Basophils Relative: 0 %
EOS ABS: 0 10*3/uL (ref 0.0–0.7)
EOS PCT: 0 %
HCT: 36.4 % — ABNORMAL LOW (ref 39.0–52.0)
HEMOGLOBIN: 11.9 g/dL — AB (ref 13.0–17.0)
LYMPHS ABS: 0.8 10*3/uL (ref 0.7–4.0)
Lymphocytes Relative: 11 %
MCH: 27.4 pg (ref 26.0–34.0)
MCHC: 32.7 g/dL (ref 30.0–36.0)
MCV: 83.7 fL (ref 78.0–100.0)
MONO ABS: 0.3 10*3/uL (ref 0.1–1.0)
MONOS PCT: 5 %
Neutro Abs: 5.8 10*3/uL (ref 1.7–7.7)
Neutrophils Relative %: 84 %
PLATELETS: 172 10*3/uL (ref 150–400)
RBC: 4.35 MIL/uL (ref 4.22–5.81)
RDW: 13.7 % (ref 11.5–15.5)
WBC: 6.9 10*3/uL (ref 4.0–10.5)

## 2017-01-13 LAB — COMPREHENSIVE METABOLIC PANEL
ALBUMIN: 3.8 g/dL (ref 3.5–5.0)
ALK PHOS: 75 U/L (ref 38–126)
ALT: 27 U/L (ref 17–63)
AST: 27 U/L (ref 15–41)
Anion gap: 10 (ref 5–15)
BUN: 92 mg/dL — ABNORMAL HIGH (ref 6–20)
CALCIUM: 9.3 mg/dL (ref 8.9–10.3)
CO2: 27 mmol/L (ref 22–32)
CREATININE: 3.36 mg/dL — AB (ref 0.61–1.24)
Chloride: 102 mmol/L (ref 101–111)
GFR calc Af Amer: 22 mL/min — ABNORMAL LOW (ref >60)
GFR calc non Af Amer: 19 mL/min — ABNORMAL LOW (ref >60)
GLUCOSE: 170 mg/dL — AB (ref 65–99)
Potassium: 3.4 mmol/L — ABNORMAL LOW (ref 3.5–5.1)
SODIUM: 139 mmol/L (ref 135–145)
Total Bilirubin: 1.4 mg/dL — ABNORMAL HIGH (ref 0.3–1.2)
Total Protein: 7.5 g/dL (ref 6.5–8.1)

## 2017-01-13 LAB — CBG MONITORING, ED: GLUCOSE-CAPILLARY: 222 mg/dL — AB (ref 65–99)

## 2017-01-13 LAB — I-STAT CG4 LACTIC ACID, ED
Lactic Acid, Venous: 1.1 mmol/L (ref 0.5–1.9)
Lactic Acid, Venous: 1.04 mmol/L (ref 0.5–1.9)

## 2017-01-13 LAB — HEMOGLOBIN A1C
HEMOGLOBIN A1C: 7 % — AB (ref 4.8–5.6)
MEAN PLASMA GLUCOSE: 154.2 mg/dL

## 2017-01-13 LAB — GLUCOSE, CAPILLARY: GLUCOSE-CAPILLARY: 287 mg/dL — AB (ref 65–99)

## 2017-01-13 LAB — SEDIMENTATION RATE: SED RATE: 56 mm/h — AB (ref 0–16)

## 2017-01-13 LAB — PREALBUMIN: PREALBUMIN: 26.9 mg/dL (ref 18–38)

## 2017-01-13 LAB — PROTIME-INR
INR: 1.13
PROTHROMBIN TIME: 14.5 s (ref 11.4–15.2)

## 2017-01-13 LAB — C-REACTIVE PROTEIN: CRP: 10.6 mg/dL — AB (ref <1.0)

## 2017-01-13 MED ORDER — ONDANSETRON HCL 4 MG/2ML IJ SOLN: 4.0000 mg | Freq: Four times a day (QID) | INTRAMUSCULAR | Status: DC | PRN

## 2017-01-13 MED ORDER — OXYCODONE HCL 5 MG PO TABS
5.0000 mg | ORAL_TABLET | ORAL | Status: DC | PRN
  Administered 2017-01-13 – 2017-01-14 (×4): 5 mg via ORAL
  Filled 2017-01-13 (×4): qty 1

## 2017-01-13 MED ORDER — DEXTROSE 5 % IV SOLN
2.0000 g | INTRAVENOUS | Status: DC
  Administered 2017-01-13 – 2017-01-14 (×2): 2 g via INTRAVENOUS
  Filled 2017-01-13 (×3): qty 2

## 2017-01-13 MED ORDER — FENTANYL CITRATE (PF) 100 MCG/2ML IJ SOLN
50.0000 ug | Freq: Once | INTRAMUSCULAR | Status: AC
  Administered 2017-01-13: 50 ug via INTRAVENOUS
  Filled 2017-01-13: qty 2

## 2017-01-13 MED ORDER — INSULIN ASPART 100 UNIT/ML ~~LOC~~ SOLN
0.0000 [IU] | Freq: Three times a day (TID) | SUBCUTANEOUS | Status: DC
  Administered 2017-01-13 – 2017-01-14 (×2): 3 [IU] via SUBCUTANEOUS
  Filled 2017-01-13: qty 1

## 2017-01-13 MED ORDER — ONDANSETRON HCL 4 MG PO TABS: 4.0000 mg | ORAL_TABLET | Freq: Four times a day (QID) | ORAL | Status: DC | PRN

## 2017-01-13 MED ORDER — ACETAMINOPHEN 325 MG PO TABS
650.0000 mg | ORAL_TABLET | Freq: Four times a day (QID) | ORAL | Status: DC | PRN
  Administered 2017-01-13 – 2017-01-15 (×5): 650 mg via ORAL
  Filled 2017-01-13 (×5): qty 2

## 2017-01-13 MED ORDER — POTASSIUM CHLORIDE IN NACL 20-0.9 MEQ/L-% IV SOLN
INTRAVENOUS | Status: AC
  Administered 2017-01-13: 1000 mL via INTRAVENOUS
  Filled 2017-01-13: qty 1000

## 2017-01-13 MED ORDER — INSULIN DETEMIR 100 UNIT/ML ~~LOC~~ SOLN
30.0000 [IU] | Freq: Every day | SUBCUTANEOUS | Status: DC
  Administered 2017-01-13 – 2017-01-14 (×2): 30 [IU] via SUBCUTANEOUS
  Filled 2017-01-13 (×2): qty 0.3

## 2017-01-13 MED ORDER — METRONIDAZOLE IN NACL 5-0.79 MG/ML-% IV SOLN
500.0000 mg | Freq: Three times a day (TID) | INTRAVENOUS | Status: DC
  Administered 2017-01-13 – 2017-01-15 (×5): 500 mg via INTRAVENOUS
  Filled 2017-01-13 (×7): qty 100

## 2017-01-13 MED ORDER — ACETAMINOPHEN 650 MG RE SUPP: 650.0000 mg | Freq: Four times a day (QID) | RECTAL | Status: DC | PRN

## 2017-01-13 MED ORDER — PIPERACILLIN-TAZOBACTAM 3.375 G IVPB 30 MIN
3.3750 g | Freq: Once | INTRAVENOUS | Status: AC
Start: 2017-01-13 — End: 2017-01-13
  Administered 2017-01-13: 3.375 g via INTRAVENOUS
  Filled 2017-01-13: qty 50

## 2017-01-13 MED ORDER — ASPIRIN EC 81 MG PO TBEC
81.0000 mg | DELAYED_RELEASE_TABLET | Freq: Every day | ORAL | Status: DC
  Administered 2017-01-14 – 2017-01-15 (×2): 81 mg via ORAL
  Filled 2017-01-13 (×2): qty 1
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
## 2017-01-13 NOTE — ED Triage Notes (Signed)
Pt reports being sent by PCP, seen today for malaise x 1 week, found to have infection on R sole of foot. Pt reports hx fracture in August, told by PCP today that ffx still there along with infection.  Site of infection appears white, red, gray. Dorsal foot red, gray.  R foot warm to touch.

## 2017-01-13 NOTE — ED Notes (Signed)
Patient had concerns about not receiving night medications Doxazosin, Metoprolol, and Hydralazine; This RN spoke with Dr.David who states med rec needs to be corrected; Pharmacy called and is looking into corrections that need to be made so that patient is able to received correct medications tonight; 5N receiving RN Gatha Mayer notified of situation;-Monique,RN

## 2017-01-13 NOTE — Telephone Encounter (Signed)
  Reason for Disposition . [1] Fever returns after gone for over 24 hours AND [2] symptoms worse or not improved  Answer Assessment - Initial Assessment Questions 1. ONSET: "When did the cough begin?"      3 weeks ago 2. SEVERITY: "How bad is the cough today?"      Dry cough 3. RESPIRATORY DISTRESS: "Describe your breathing."      Short of breath with coughing 4. FEVER: "Do you have a fever?" If so, ask: "What is your temperature, how was it measured, and when did it start?"     100 by mouth 5. HEMOPTYSIS: "Are you coughing up any blood?" If so ask: "How much?" (flecks, streaks, tablespoons, etc.)     No 6. TREATMENT: "What have you done so far to treat the cough?" (e.g., meds, fluids, humidifier)     Tylenol and [place] 7. CARDIAC HISTORY: "Do you have any history of heart disease?" (e.g., heart attack, congestive heart failure)      High blood pressure 8. LUNG HISTORY: "Do you have any history of lung disease?"  (e.g., pulmonary embolus, asthma, emphysema)     No 9. PE RISK FACTORS: "Do you have a history of blood clots?" (or: recent major surgery, recent prolonged travel, bedridden )     No 10. OTHER SYMPTOMS: "Do you have any other symptoms? (e.g., runny nose, wheezing, chest pain)       Runny nose with yellow drainage 11. PREGNANCY: "Is there any chance you are pregnant?" "When was your last menstrual period?"       No  12. TRAVEL: "Have you traveled out of the country in the last month?" (e.g., travel history, exposures)       No  Protocols used: COUGH - ACUTE NON-PRODUCTIVE-A-AH

## 2017-01-13 NOTE — Telephone Encounter (Signed)
Pt. States he feels like he has "pneumonia." Appointment made for today.

## 2017-01-13 NOTE — Patient Instructions (Addendum)
  To ED for further evaluation and treatment.   IF you received an x-ray today, you will receive an invoice from [facility]. Please contact [facility] at [phone] with questions or concerns regarding your invoice.   IF you received labwork today, you will receive an invoice from [facility]. Please contact [facility] at [phone] with questions or concerns regarding your invoice.   Our billing staff will not be able to assist you with questions regarding bills from these companies.  You will be contacted with the lab results as soon as they are available. The fastest way to get your results is to activate your My Chart account. Instructions are located on the last page of this paperwork. If you have not heard from [place] regarding the results in 2 weeks, please contact this office.

## 2017-01-13 NOTE — ED Notes (Signed)
Paged admitting provider per RN 
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
## 2017-01-13 NOTE — H&P (Signed)
History and Physical    Samuel Reed [id number] DOB: 1960-04-18 DOA: 01/13/2017  PCP: [name], MD  Patient coming from:  home  Chief Complaint:   Left foot red  HPI: Samuel Reed is a 56 y.o. male with medical history significant of dm comes in with about  2 days of left foot starting to get red and purulent discharge from wound to bottom of foot.  He started having chills and fevers last night. Has cellulitis up to ankle.  No previous infection to this foot.  Referred for admission for iv abx.  No previous oral antibiotics recently.  Review of Systems: As per HPI otherwise 10 point review of systems negative.   Past Medical History:  Diagnosis Date  . Chronic venous insufficiency   . Diabetes mellitus type II 2003  . HLD (hyperlipidemia)   . HTN (hypertension) 1980's  . OSA (obstructive sleep apnea)   . Sleep apnea     Past Surgical History:  Procedure Laterality Date  . "Bone removal" from back  1987  . CARDIAC CATHETERIZATION    . CARDIAC CATHETERIZATION N/A 02/12/2015   Procedure: Left Heart Cath and Coronary Angiography;  Surgeon: Adrian Prows, MD;  Location: [facility];  Service: Cardiovascular;  Laterality: N/A;  . KNEE CARTILAGE SURGERY     left knee  . [place]     reports that he has quit smoking. His smoking use included pipe. he has never used smokeless tobacco. He reports that he does not drink alcohol or use drugs.  No Known Allergies  Family History  Problem Relation Age of Onset  . COPD Father   . Cancer Father        prostate  . Prostate cancer Father   . Diabetes Mother   . Coronary artery disease Paternal Grandfather   . Colon cancer Neg Hx   . Colon polyps Neg Hx   . Esophageal cancer Neg Hx   . Rectal cancer Neg Hx   . Stomach cancer Neg Hx     Prior to Admission medications   Medication Sig Start Date End Date Taking? Authorizing Provider  amLODipine (NORVASC) 10 MG tablet TAKE 1 TABLET (10 MG  TOTAL) BY MOUTH DAILY. 08/11/16  Yes Viviana Simpler I, MD  aspirin EC 81 MG tablet Take 81 mg by mouth daily.   Yes [provider]  doxazosin (CARDURA) 4 MG tablet TAKE 1 TABLET (4 MG TOTAL) BY MOUTH AT BEDTIME. 12/28/16  Yes [name], MD  Ferrous Gluconate 324 (37.5 Fe) MG TABS Take 2 tablets by mouth daily.    Yes [provider]  fluticasone (FLONASE) 50 MCG/ACT nasal spray Place 1 spray into both nostrils 2 (two) times daily. 09/04/16  Yes Viviana Simpler I, MD  hydrALAZINE (APRESOLINE) 50 MG tablet TAKE 1 TABLET BY MOUTH 3 TIMES A DAY Patient taking differently: TAKE 50 mg TABLET BY MOUTH 3 TIMES A DAY 07/06/16  Yes Viviana Simpler I, MD  LEVEMIR 100 UNIT/ML injection INJECT 65 UNITS INTO THE SKIN AT BEDTIME. Patient taking differently: INJECT 50 UNITS INTO THE SKIN AT BEDTIME. 12/28/16  Yes Philemon Kingdom, MD  lisinopril-hydrochlorothiazide (PRINZIDE,ZESTORETIC) 20-12.5 MG tablet TAKE 2 TABLETS BY MOUTH EVERY DAY 08/28/16  Yes [name], MD  metoprolol tartrate (LOPRESSOR) 100 MG tablet TAKE 1 TABLET (100 MG TOTAL) BY MOUTH 2 (TWO) TIMES DAILY. 10/05/16  Yes [name], MD  NOVOLOG FLEXPEN 100 UNIT/ML FlexPen INJECT 10-20  UNITS INTO THE SKIN 3(THREE) TIMES DAILY WITH MEALS AS INSTRUCTED. 01/12/17  Yes Philemon Kingdom, MD  PRESCRIPTION MEDICATION Cpap   Yes [provider]  rosuvastatin (CRESTOR) 20 MG tablet TAKE 1 TABLET BY MOUTH EVERY DAY Patient taking differently: TAKE 20 mg TABLET BY MOUTH EVERY DAY 08/27/16  Yes [name], MD  torsemide (DEMADEX) 20 MG tablet Take 40 mg by mouth daily. 01/06/17  Yes [provider]  furosemide (LASIX) 40 MG tablet TAKE 1 TABLET BY MOUTH EVERY DAY Patient not taking: Reported on 10/01/2016 08/10/16   Viviana Simpler I, MD  metFORMIN (GLUCOPHAGE) 1000 MG tablet TAKE 1 TABLET (1,000 MG TOTAL) BY MOUTH 2 (TWO) TIMES DAILY WITH A MEAL. Patient not taking: Reported on 01/13/2017 10/12/16   Philemon Kingdom, MD    Physical Exam: Vitals:   01/13/17 1648 01/13/17 1715 01/13/17 1730 01/13/17 1745  BP: (!) 160/74 (!) 172/74 (!) 176/72 (!) 161/54  Pulse: 74 84 84 88  Resp: 18   18  Temp:      TempSrc:      SpO2: 98% 97% 100% 94%      Constitutional: NAD, calm, comfortable Vitals:   01/13/17 1648 01/13/17 1715 01/13/17 1730 01/13/17 1745  BP: (!) 160/74 (!) 172/74 (!) 176/72 (!) 161/54  Pulse: 74 84 84 88  Resp: 18   18  Temp:      TempSrc:      SpO2: 98% 97% 100% 94%   Eyes: PERRL, lids and conjunctivae normal ENMT: Mucous membranes are moist. Posterior pharynx clear of any exudate or lesions.Normal dentition.  Neck: normal, supple, no masses, no thyromegaly Respiratory: clear to auscultation bilaterally, no wheezing, no crackles. Normal respiratory effort. No accessory muscle use.  Cardiovascular: Regular rate and rhythm, no murmurs / rubs / gallops. No extremity edema. 2+ pedal pulses. No carotid bruits.  Abdomen: no tenderness, no masses palpated. No hepatosplenomegaly. Bowel sounds positive.  Musculoskeletal: no clubbing / cyanosis. No joint deformity upper and lower extremities. Good ROM, no contractures. Normal muscle tone.  Skin: left foot with foul smell and drainage from wound at bottom of foot with redness to foot and up to ankle c/w infected wound with cellulitis Neurologic: CN 2-12 grossly intact. Sensation intact, DTR normal. Strength 5/5 in all 4.  Psychiatric: Normal judgment and insight. Alert and oriented x 3. Normal mood.    Labs on Admission: I have personally reviewed following labs and imaging studies  CBC: Recent Labs  Lab 01/13/17 1436  WBC 6.9  NEUTROABS 5.8  HGB 11.9*  HCT 36.4*  MCV 83.7  PLT 397   Basic Metabolic Panel: Recent Labs  Lab 01/13/17 1436  NA 139  K 3.4*  CL 102  CO2 27  GLUCOSE 170*  BUN 92*  CREATININE 3.36*  CALCIUM 9.3   GFR: Estimated Creatinine Clearance: 31.9 mL/min (A) (by C-G formula based on SCr of 3.36  mg/dL (H)). Liver Function Tests: Recent Labs  Lab 01/13/17 1436  AST 27  ALT 27  ALKPHOS 75  BILITOT 1.4*  PROT 7.5  ALBUMIN 3.8   No results for input(s): LIPASE, AMYLASE in the last 168 hours. No results for input(s): AMMONIA in the last 168 hours. Coagulation Profile: Recent Labs  Lab 01/13/17 1531  INR 1.13   Cardiac Enzymes: No results for input(s): CKTOTAL, CKMB, CKMBINDEX, TROPONINI in the last 168 hours. BNP (last 3 results) No results for input(s): PROBNP in the last 8760 hours. HbA1C: No results for input(s): HGBA1C  in the last 72 hours. CBG: No results for input(s): GLUCAP in the last 168 hours. Lipid Profile: No results for input(s): CHOL, HDL, LDLCALC, TRIG, CHOLHDL, LDLDIRECT in the last 72 hours. Thyroid Function Tests: No results for input(s): TSH, T4TOTAL, FREET4, T3FREE, THYROIDAB in the last 72 hours. Anemia Panel: No results for input(s): VITAMINB12, FOLATE, FERRITIN, TIBC, IRON, RETICCTPCT in the last 72 hours. Urine analysis:    Component Value Date/Time   COLORURINE YELLOW 04/15/2012 1415   APPEARANCEUR CLEAR 04/15/2012 1415   LABSPEC 1.038 (H) 04/15/2012 1415   PHURINE 5.0 04/15/2012 1415   GLUCOSEU >1000 (A) 04/15/2012 1415   HGBUR TRACE (A) 04/15/2012 1415   HGBUR negative 04/05/2009 1451   BILIRUBINUR NEGATIVE 04/15/2012 1415   KETONESUR NEGATIVE 04/15/2012 1415   PROTEINUR >300 (A) 04/15/2012 1415   UROBILINOGEN 0.2 04/15/2012 1415   NITRITE NEGATIVE 04/15/2012 1415   LEUKOCYTESUR NEGATIVE 04/15/2012 1415   Sepsis Labs: !!!!!!!!!!!!!!!!!!!!!!!!!!!!!!!!!!!!!!!!!!!! @LABRCNTIP (procalcitonin:4,lacticidven:4) )No results found for this or any previous visit (from the past 240 hour(s)).   Radiological Exams on Admission: Dg Chest 2 View  Result Date: 01/13/2017 CLINICAL DATA:  Fever, unspecified cause. Lower respiratory infection. EXAM: CHEST  2 VIEW COMPARISON:  12/14/2016 and 02/12/2015. FINDINGS: The heart size and mediastinal  contours are stable. The lungs are mildly hyperinflated but clear. There is no pleural effusion or pneumothorax. No acute osseous findings are demonstrated. There are degenerative changes throughout the thoracic spine. IMPRESSION: Stable chest without acute findings. Suspected mild chronic obstructive pulmonary disease. Electronically Signed   By: Richardean Sale M.D.   On: 01/13/2017 12:45   Dg Foot Complete Left  Result Date: 01/13/2017 CLINICAL DATA:  Left foot infection. EXAM: LEFT FOOT - COMPLETE 3+ VIEW COMPARISON:  Radiographs of July 03, 2016. FINDINGS: Old proximal fifth metatarsal fracture is noted with callus formation, but persistent fracture line remains. Moderate spurring of posterior calcaneus is noted. No new fracture or other bony abnormality is noted. Soft tissue swelling of the fourth and fifth toes is noted suggesting inflammation or infection. No lytic destruction is seen to suggest osteomyelitis. IMPRESSION: Old fifth proximal metatarsal fracture is noted with callus formation, but persistent fracture line and nonunion remains. Swelling of fourth and fifth toes is noted consistent with infection or cellulitis, but no lytic destruction is seen to suggest osteomyelitis. Electronically Signed   By: Marijo Conception, M.D.   On: 01/13/2017 12:29    Assessment/Plan 56 yo male with cellulitis left foot  Principal Problem:   Cellulitis- iv rocephin and flagyl per recommendations of cone diabetic foot infection pathway which has been utilized.  Wound care consult.  Ck abis.  Ck sed rate.  Active Problems:   Essential hypertension, benign- stable cont home meds   Uncontrolled type 2 diabetes mellitus with stage 3 chronic kidney disease, with long-term current use of insulin ([place])- stable, cont lantus at lower home dose and ssi   Chronic kidney disease, stage III (moderate) ([place])- stable.  Gentle ivf overnight, a little worse than baseline      DVT prophylaxis:  scds Code Status:   full Family Communication:  none Disposition Plan:  Per day team Consults called:  none Admission status:  admit   Julianna Vanwagner A MD [facility]  If 7PM-7AM, please contact night-coverage [email or web address] Password [email or web address]  01/13/2017, 7:17 PM

## 2017-01-14 ENCOUNTER — Encounter (HOSPITAL_COMMUNITY): Payer: Self-pay | Admitting: General Practice

## 2017-01-14 ENCOUNTER — Inpatient Hospital Stay (HOSPITAL_COMMUNITY): Payer: 59

## 2017-01-14 ENCOUNTER — Telehealth: Payer: Self-pay | Admitting: Internal Medicine

## 2017-01-14 ENCOUNTER — Other Ambulatory Visit: Payer: Self-pay

## 2017-01-14 DIAGNOSIS — L089 Local infection of the skin and subcutaneous tissue, unspecified: Secondary | ICD-10-CM

## 2017-01-14 DIAGNOSIS — E876 Hypokalemia: Secondary | ICD-10-CM

## 2017-01-14 DIAGNOSIS — D649 Anemia, unspecified: Secondary | ICD-10-CM

## 2017-01-14 DIAGNOSIS — E1142 Type 2 diabetes mellitus with diabetic polyneuropathy: Secondary | ICD-10-CM

## 2017-01-14 DIAGNOSIS — L0390 Cellulitis, unspecified: Secondary | ICD-10-CM

## 2017-01-14 LAB — GLUCOSE, CAPILLARY
GLUCOSE-CAPILLARY: 206 mg/dL — AB (ref 65–99)
Glucose-Capillary: 329 mg/dL — ABNORMAL HIGH (ref 65–99)
Glucose-Capillary: 243 mg/dL — ABNORMAL HIGH (ref 65–99)
Glucose-Capillary: 269 mg/dL — ABNORMAL HIGH (ref 65–99)

## 2017-01-14 LAB — BASIC METABOLIC PANEL
Anion gap: 10 (ref 5–15)
BUN: 87 mg/dL — AB (ref 6–20)
CHLORIDE: 100 mmol/L — AB (ref 101–111)
CO2: 28 mmol/L (ref 22–32)
CREATININE: 3.27 mg/dL — AB (ref 0.61–1.24)
Calcium: 9 mg/dL (ref 8.9–10.3)
GFR calc Af Amer: 23 mL/min — ABNORMAL LOW (ref >60)
GFR, EST NON AFRICAN AMERICAN: 20 mL/min — AB (ref >60)
GLUCOSE: 222 mg/dL — AB (ref 65–99)
POTASSIUM: 3.2 mmol/L — AB (ref 3.5–5.1)
SODIUM: 138 mmol/L (ref 135–145)

## 2017-01-14 LAB — CBC
HCT: 35.4 % — ABNORMAL LOW (ref 39.0–52.0)
HEMOGLOBIN: 11.6 g/dL — AB (ref 13.0–17.0)
MCH: 27.2 pg (ref 26.0–34.0)
MCHC: 32.8 g/dL (ref 30.0–36.0)
MCV: 83.1 fL (ref 78.0–100.0)
Platelets: 153 10*3/uL (ref 150–400)
RBC: 4.26 MIL/uL (ref 4.22–5.81)
RDW: 13.7 % (ref 11.5–15.5)
WBC: 5.7 10*3/uL (ref 4.0–10.5)

## 2017-01-14 LAB — HIV ANTIBODY (ROUTINE TESTING W REFLEX): HIV Screen 4th Generation wRfx: NONREACTIVE

## 2017-01-14 LAB — PHOSPHORUS: PHOSPHORUS: 4.1 mg/dL (ref 2.5–4.6)

## 2017-01-14 LAB — MAGNESIUM: MAGNESIUM: 2 mg/dL (ref 1.7–2.4)

## 2017-01-14 MED ORDER — PRO-STAT SUGAR FREE PO LIQD
30.0000 mL | Freq: Three times a day (TID) | ORAL | Status: DC
  Administered 2017-01-14: 30 mL via ORAL
  Filled 2017-01-14: qty 30

## 2017-01-14 MED ORDER — POTASSIUM CHLORIDE CRYS ER 20 MEQ PO TBCR
40.0000 meq | EXTENDED_RELEASE_TABLET | Freq: Two times a day (BID) | ORAL | Status: AC
  Administered 2017-01-14: 20 meq via ORAL
  Administered 2017-01-14: 40 meq via ORAL
  Filled 2017-01-14 (×2): qty 2

## 2017-01-14 MED ORDER — INSULIN ASPART 100 UNIT/ML ~~LOC~~ SOLN
0.0000 [IU] | Freq: Three times a day (TID) | SUBCUTANEOUS | Status: DC
  Administered 2017-01-14: 5 [IU] via SUBCUTANEOUS
  Administered 2017-01-14: 11 [IU] via SUBCUTANEOUS
  Administered 2017-01-15: 3 [IU] via SUBCUTANEOUS

## 2017-01-14 MED ORDER — HYDRALAZINE HCL 50 MG PO TABS
50.0000 mg | ORAL_TABLET | Freq: Three times a day (TID) | ORAL | Status: DC
  Administered 2017-01-14 – 2017-01-15 (×4): 50 mg via ORAL
  Filled 2017-01-14 (×4): qty 1

## 2017-01-14 MED ORDER — HYDROCHLOROTHIAZIDE 25 MG PO TABS
25.0000 mg | ORAL_TABLET | Freq: Every day | ORAL | Status: DC
  Administered 2017-01-14 – 2017-01-15 (×2): 25 mg via ORAL
  Filled 2017-01-14 (×2): qty 1

## 2017-01-14 MED ORDER — LISINOPRIL-HYDROCHLOROTHIAZIDE 20-12.5 MG PO TABS: 2.0000 | ORAL_TABLET | Freq: Every day | ORAL | Status: DC

## 2017-01-14 MED ORDER — METOPROLOL TARTRATE 100 MG PO TABS
100.0000 mg | ORAL_TABLET | Freq: Two times a day (BID) | ORAL | Status: DC
  Administered 2017-01-14 – 2017-01-15 (×3): 100 mg via ORAL
  Filled 2017-01-14 (×3): qty 1

## 2017-01-14 MED ORDER — LISINOPRIL 40 MG PO TABS
40.0000 mg | ORAL_TABLET | Freq: Every day | ORAL | Status: DC
  Administered 2017-01-14 – 2017-01-15 (×2): 40 mg via ORAL
  Filled 2017-01-14 (×2): qty 1

## 2017-01-14 MED ORDER — FLUTICASONE PROPIONATE 50 MCG/ACT NA SUSP
1.0000 | Freq: Two times a day (BID) | NASAL | Status: DC
  Administered 2017-01-14 – 2017-01-15 (×3): 1 via NASAL
  Filled 2017-01-14: qty 16

## 2017-01-14 MED ORDER — DOXAZOSIN MESYLATE 4 MG PO TABS
4.0000 mg | ORAL_TABLET | Freq: Every day | ORAL | Status: DC
  Administered 2017-01-14: 4 mg via ORAL
  Filled 2017-01-14: qty 1

## 2017-01-14 MED ORDER — ROSUVASTATIN CALCIUM 10 MG PO TABS
20.0000 mg | ORAL_TABLET | Freq: Every day | ORAL | Status: DC
  Administered 2017-01-14 – 2017-01-15 (×2): 20 mg via ORAL
  Filled 2017-01-14 (×2): qty 2

## 2017-01-14 MED ORDER — FERROUS GLUCONATE 324 (38 FE) MG PO TABS
648.0000 mg | ORAL_TABLET | Freq: Every day | ORAL | Status: DC
  Administered 2017-01-14: 648 mg via ORAL
  Filled 2017-01-14 (×2): qty 2

## 2017-01-14 MED ORDER — AMLODIPINE BESYLATE 10 MG PO TABS
10.0000 mg | ORAL_TABLET | Freq: Every day | ORAL | Status: DC
  Administered 2017-01-14 – 2017-01-15 (×2): 10 mg via ORAL
  Filled 2017-01-14 (×2): qty 1

## 2017-01-14 MED ORDER — INSULIN ASPART 100 UNIT/ML ~~LOC~~ SOLN
0.0000 [IU] | Freq: Every day | SUBCUTANEOUS | Status: DC
  Administered 2017-01-14: 3 [IU] via SUBCUTANEOUS

## 2017-01-14 NOTE — Progress Notes (Signed)
Spoke with patient about his diabetes. Was diagnosed about 20 years ago. Was on oral medications for about 10 years. Sees Dr. Renne Crigler as his endocrinologist and has been on low carb diet for awhile and has lost weight. Has a Libre continuous glucose monitor that he wears at home on his upper arm for glucose management. States that he takes Levemir 50 units every HS  And Novolog 10-20 units TID sliding scale at home. Reviewed importance of checking his feet every day and watching for any changes to be reported to physician if not improving. HgbA1c is 7.0%. He states that his goal is to get it to 6.5%. Will continue to monitor blood sugars while in the hospital.  Harvel Ricks RN BSN CDE Diabetes Coordinator Pager: [phone]  8am-5pm

## 2017-01-14 NOTE — Progress Notes (Addendum)
VASCULAR LAB PRELIMINARY  ARTERIAL  ABI completed: Normal ABI and TBI at rest.     RIGHT    LEFT    PRESSURE WAVEFORM  PRESSURE WAVEFORM  BRACHIAL 166 Triphasic BRACHIAL 176 triphasic  DP 206 Triphasic DP 201 Triphasic  PTA 221 Triphasic PTA 211 Triphasic  GREAT TOE 1.0 NA GREAT TOE 0.97 NA    RIGHT LEFT  ABI 1.2 1.2     Mirriam Vadala D, RVT 01/14/2017, 10:06 AM

## 2017-01-14 NOTE — Telephone Encounter (Signed)
Much Better! Great!

## 2017-01-14 NOTE — Telephone Encounter (Signed)
Please advise on below  

## 2017-01-14 NOTE — Consult Note (Addendum)
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 And plantar surface area that is not open. Patient reports area at the base of the 5th toe was opened by MD Pressure Injury POA: NA Measurement: 2.5cm x 1.7cm area with linear opening 5th toe plantar surface Area on the plantar foot surface just proximal to the open area has skin changes with 0.3cm 0.3cm x 0cm area that is not able to be probed open at the time of my assessment Wound bed: open area is fluctuant with non viable tissue visible under the linear I&D site  Drainage (amount, consistency, odor) moderate, with odor.  Not able to express drainage today at the time of my assessment Periwound: erythema but appears to be resolving based on markings on dorsal foot  Dressing procedure/placement/frequency:  If MRI ordered may change options for care  Will discuss with hospitalist that I can trim away non viable tissue in order to begin treatment with enzymatic debridement agent, however if MRI planned may need orthopedic evaluation.   Either way would recommend follow up in wound care center of patient's choice after DC.   Will ask hospitalist to review my notes and collaborate on next steps in care.    Charday Capetillo [facility], CNS, CWON-AP [phone]  Addendum: 1225:discussed case with Dr. Alfredia Ferguson, he will order MRI to verify no osteomylitis.  After resulted if no concerns I will provide small bit of CSWD to allow enzymatic debridement to work efficiently.  [place], [place], [place]

## 2017-01-14 NOTE — Telephone Encounter (Signed)
Patient want Dr Cruzita Lederer to know his A1C is 7.0

## 2017-01-14 NOTE — Progress Notes (Signed)
Initial Nutrition Assessment  DOCUMENTATION CODES:   Obesity unspecified  INTERVENTION:    Prostat liquid protein po 30 ml TID with meals, each supplement provides 100 kcal, 15 grams protein  NUTRITION DIAGNOSIS:   Increased nutrient needs related to wound healing as evidenced by estimated needs  GOAL:   Patient will meet greater than or equal to 90% of their needs  MONITOR:   PO intake, Supplement acceptance, Labs, Weight trends, Skin  REASON FOR ASSESSMENT:   Consult Wound healing  ASSESSMENT:   56 y.o. Male with medical history significant of HTN, HLD, DM Type 2, CKD Stage 3 and other comorbids who comes in with about  2 days of left foot starting to get red and purulent discharge from wound to bottom of foot. He started having chills and fevers last night. Has cellulitis up to ankle. No previous infection to this foot. Referred for admission for IV Abx for Cellulitis.   Pt admitted with acute L foot cellulitis. PO intake good at 75-80% per flowsheet records. Would benefit from liquid protein supplement. Labs and medications reviewed. CBG's [phone].  Nutrition-Focused physical exam completed. Findings are no fat depletion, no muscle depletion, and no edema.   Diet Order:  Diet heart healthy/carb modified Room service appropriate? 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  Estimated Nutritional Needs:   Kcal:  2300-2500  Protein:  120-135 gm  Fluid:  2.3-2.5 L  Arthur Holms,  RD, LDN Pager #: [phone] After-Hours Pager #: [phone]

## 2017-01-14 NOTE — Progress Notes (Signed)
PROGRESS NOTE    Samuel Reed  GHW:[id number] DOB: 05-23-1960 DOA: 01/13/2017 PCP: [name], MD   Brief Narrative:  Samuel Reed is a 56 y.o. male with medical history significant of HTN, HLD, DM Type 2, CKD Stage 3 and other comorbids who comes in with about  2 days of left foot starting to get red and purulent discharge from wound to bottom of foot. He started having chills and fevers last night. Has cellulitis up to ankle. No previous infection to this foot. Referred for admission for IV Abx for Cellulitis.  No previous oral antibiotics recently. Obtaining MRI to r/o Osteomyelitis.   Assessment & Plan:   Principal Problem:   Cellulitis Active Problems:   Hyperlipemia   Essential hypertension, benign   Polyneuropathy, diabetic ([place])   Uncontrolled type 2 diabetes mellitus with stage 3 chronic kidney disease, with long-term current use of insulin (HCC)   Chronic kidney disease, stage III (moderate) (HCC)   Left foot infection   Normocytic anemia   Hypokalemia  Acute Left Foot Cellulitis -Foot X-Ray showed Old fifth proximal metatarsal fracture is noted with callus formation, but persistent fracture line and nonunion remains. Swelling of fourth and fifth toes is noted consistent with infection or cellulitis, but no lytic destruction is seen to suggest osteomyelitis. -Placed on IV Rocephin and Rlagyl per recommendations of cone diabetic foot infection pathway which has been utilized.   -Wound Care Consulted    -Checked ABI's and were Normal -CRP was 10.6 and ESR was 56 -Check MRI of the Foot to Evaluate for Osteo; If Osteo will consult Orthopedic Surgery -Discussed with [place] Nurse and based on MRI she will trim away non viable tissue in order to begin treatment with enzymatic debridement; If MRI positive for Osteomyelitis will consult Orthopedic Surgery and discuss with Infectious Diseases  -Blood Cx x2 are pending  -Will need Wound Care at D/C -Pain Control with  Oxycodone 5 mg po q4hprn  Essential Hypertension -C/w Amlodipine 10 mg po Daily, Doxazosin 4 mg po qHS, Hydralazine 50 mg TID, HCTZ 25 mg po Daily, Lisinopril 40 mg po Daily, Metoprolol 100 mg po BID -Hold Home Torsemide 40 mg Daily  -If Necessary will add IV Hydralazine 10 mg q6hprn for SBP>180 or DBP>100  Uncontrolled Type 2 Diabetes Mellitus with Stage 3 Chronic Kidney Disease,  -With long-term current use of insulin -HbA1c was 7.0 -C/w Lantus 30 units sq qHS -Increased Sensitive Novolog SSI AC/HS to Moderate Novolog SSI -Diabetes Education Coordinator Consulted for further evaluation and recc's -CBG's ranging from 206-329    Stage 3 CKD -BUN/Cr slightly worse than Baseline -Given IVF at 75 mL/hr -BUN/Cr went from 92/3.36 -> 87/3.27 -Continue to Monitor Closely and repeat CMP in AM   HLD -C/w Rosuvastatin 20 mg po Daily   Hypokalemia -Patient's K+ was 3.2 this AM -Magnesium was wNL at 2.0 -Replete with po KCl 40 mEQ BID x 2 -Continue to Monitor and Replete as Necessary -Repeat CMP in AM   Normocytic Anemia -Likely Anemia of Chronic Disease -Hb/Hct went from 11.9/36.4 -> 11.6/35.4 -C/w Ferrous Gluconate 648 mg po Daily -Repeat CMP in AM   DVT prophylaxis: SCD's; Add Heparin 5,000 mg sq q8h Code Status: FULL CODE Family Communication: No Family Present at bedside  Disposition Plan: Remain Inpatient for further treatment and management  Consultants:   [place] Nurse   Procedures:  ABI  ABI completed: Normal ABI and TBI at rest.    RIGHT    LEFT  PRESSURE WAVEFORM  PRESSURE WAVEFORM  BRACHIAL 166 Triphasic BRACHIAL 176 triphasic  DP 206 Triphasic DP 201 Triphasic  PTA 221 Triphasic PTA 211 Triphasic  GREAT TOE 1.0 NA GREAT TOE 0.97 NA    RIGHT LEFT  ABI 1.2 1.2    Antimicrobials:  Anti-infectives (From admission, onward)   Start     Dose/Rate Route Frequency Ordered Stop   01/13/17 1949  cefTRIAXone (ROCEPHIN) 2 g in dextrose 5 % 50 mL IVPB      2 g 100 mL/hr over 30 Minutes Intravenous Every 24 hours 01/13/17 1950     01/13/17 1949  metroNIDAZOLE (FLAGYL) IVPB 500 mg     500 mg 100 mL/hr over 60 Minutes Intravenous Every 8 hours 01/13/17 1950     01/13/17 1730  piperacillin-tazobactam (ZOSYN) IVPB 3.375 g     3.375 g 100 mL/hr over 30 Minutes Intravenous  Once 01/13/17 1715 01/13/17 1850     Subjective: Seen and examined and states his foot redness looks better. No CP or SOB. Was concerned about his BP meds not being started. No other concerns or complaints at this time.   Objective: Vitals:   01/13/17 1745 01/13/17 2319 01/14/17 0644 01/14/17 1053  BP: (!) 161/54 (!) 142/43 (!) 161/73 (!) 163/75  Pulse: 88 73 74 64  Resp: _0 Temp:  99.7 F (37.6 C) (!) 96.8 F (36 C)   TempSrc:  Oral Oral   SpO2: 94% 93% 99%     Intake/Output Summary (Last 24 hours) at 01/14/2017 1418 Last data filed at 01/14/2017 0800 Gross per 24 hour  Intake 340 ml  Output -  Net 340 ml   There were no vitals filed for this visit.  Examination: Physical Exam:  Constitutional: WN/WD Caucasian Male in NAD and appears calm Eyes: Lids and conjunctivae normal, sclerae anicteric  ENMT: External Ears, Nose appear normal. Grossly normal hearing. Mucous membranes are moist.  Neck: Appears normal, supple, no cervical masses, normal ROM, no appreciable thyromegaly, no JVD Respiratory: CTAB, no wheezing, rales, rhonchi or crackles. Normal respiratory effort and patient is not tachypenic. No accessory muscle use.  Cardiovascular: RRR, no murmurs / rubs / gallops. S1 and S2 auscultated. Has some pedal edema.  Abdomen: Soft, non-tender, non-distended. No masses palpated. No appreciable hepatosplenomegaly. Bowel sounds positive x4.  GU: Deferred. Musculoskeletal: No clubbing / cyanosis of digits/nails. No joint deformity upper and lower extremities. Skin: Has ulcer on plantar aspect of Left foot near 4th and 5th toe with erythema and necrotic  changes. Warm and dry.  Neurologic: CN 2-12 grossly intact with no focal deficits. Romberg sign and cerebellar reflexes not assessed.  Psychiatric: Normal judgment and insight. Alert and oriented x 3. Normal mood and appropriate affect.   Data Reviewed: I have personally reviewed following labs and imaging studies  CBC: Recent Labs  Lab 01/13/17 1436 01/14/17 0718  WBC 6.9 5.7  NEUTROABS 5.8  --   HGB 11.9* 11.6*  HCT 36.4* 35.4*  MCV 83.7 83.1  PLT 172 035   Basic Metabolic Panel: Recent Labs  Lab 01/13/17 1436 01/14/17 0718 01/14/17 0948  NA 139 138  --   K 3.4* 3.2*  --   CL 102 100*  --   CO2 27 28  --   GLUCOSE 170* 222*  --   BUN 92* 87*  --   CREATININE 3.36* 3.27*  --   CALCIUM 9.3 9.0  --   MG  --   --  2.0  PHOS  --   --  4.1   GFR: Estimated Creatinine Clearance: 32.8 mL/min (A) (by C-G formula based on SCr of 3.27 mg/dL (H)). Liver Function Tests: Recent Labs  Lab 01/13/17 1436  AST 27  ALT 27  ALKPHOS 75  BILITOT 1.4*  PROT 7.5  ALBUMIN 3.8   No results for input(s): LIPASE, AMYLASE in the last 168 hours. No results for input(s): AMMONIA in the last 168 hours. Coagulation Profile: Recent Labs  Lab 01/13/17 1531  INR 1.13   Cardiac Enzymes: No results for input(s): CKTOTAL, CKMB, CKMBINDEX, TROPONINI in the last 168 hours. BNP (last 3 results) No results for input(s): PROBNP in the last 8760 hours. HbA1C: Recent Labs    01/13/17 2009  HGBA1C 7.0*   CBG: Recent Labs  Lab 01/13/17 2005 01/13/17 2312 01/14/17 0640 01/14/17 1201  GLUCAP 222* 287* 206* 329*   Lipid Profile: No results for input(s): CHOL, HDL, LDLCALC, TRIG, CHOLHDL, LDLDIRECT in the last 72 hours. Thyroid Function Tests: No results for input(s): TSH, T4TOTAL, FREET4, T3FREE, THYROIDAB in the last 72 hours. Anemia Panel: No results for input(s): VITAMINB12, FOLATE, FERRITIN, TIBC, IRON, RETICCTPCT in the last 72 hours. Sepsis Labs: Recent Labs  Lab  01/13/17 1600 01/13/17 1811  LATICACIDVEN 1.10 1.04    No results found for this or any previous visit (from the past 240 hour(s)).   Radiology Studies: Dg Chest 2 View  Result Date: 01/13/2017 CLINICAL DATA:  Fever, unspecified cause. Lower respiratory infection. EXAM: CHEST  2 VIEW COMPARISON:  12/14/2016 and 02/12/2015. FINDINGS: The heart size and mediastinal contours are stable. The lungs are mildly hyperinflated but clear. There is no pleural effusion or pneumothorax. No acute osseous findings are demonstrated. There are degenerative changes throughout the thoracic spine. IMPRESSION: Stable chest without acute findings. Suspected mild chronic obstructive pulmonary disease. Electronically Signed   By: Richardean Sale M.D.   On: 01/13/2017 12:45   Dg Foot Complete Left  Result Date: 01/13/2017 CLINICAL DATA:  Left foot infection. EXAM: LEFT FOOT - COMPLETE 3+ VIEW COMPARISON:  Radiographs of July 03, 2016. FINDINGS: Old proximal fifth metatarsal fracture is noted with callus formation, but persistent fracture line remains. Moderate spurring of posterior calcaneus is noted. No new fracture or other bony abnormality is noted. Soft tissue swelling of the fourth and fifth toes is noted suggesting inflammation or infection. No lytic destruction is seen to suggest osteomyelitis. IMPRESSION: Old fifth proximal metatarsal fracture is noted with callus formation, but persistent fracture line and nonunion remains. Swelling of fourth and fifth toes is noted consistent with infection or cellulitis, but no lytic destruction is seen to suggest osteomyelitis. Electronically Signed   By: Marijo Conception, M.D.   On: 01/13/2017 12:29   Scheduled Meds: . amLODipine  10 mg Oral Daily  . aspirin EC  81 mg Oral Daily  . doxazosin  4 mg Oral QHS  . ferrous gluconate  648 mg Oral Daily  . fluticasone  1 spray Each Nare BID  . hydrALAZINE  50 mg Oral TID  . hydrochlorothiazide  25 mg Oral Daily  . insulin  aspart  0-15 Units Subcutaneous TID WC  . insulin aspart  0-5 Units Subcutaneous QHS  . insulin detemir  30 Units Subcutaneous QHS  . lisinopril  40 mg Oral Daily  . metoprolol tartrate  100 mg Oral BID  . potassium chloride  40 mEq Oral BID  . rosuvastatin  20 mg Oral Daily   Continuous  Infusions: . cefTRIAXone (ROCEPHIN)  IV 2 g (01/13/17 2037)   And  . metronidazole 500 mg (01/14/17 1206)    LOS: 1 day   Kerney Elbe, DO [facility] Pager [phone]  If 7PM-7AM, please contact night-coverage [email or web address] Password [email or web address] 01/14/2017, 2:18 PM

## 2017-01-15 ENCOUNTER — Telehealth: Payer: Self-pay | Admitting: Internal Medicine

## 2017-01-15 ENCOUNTER — Other Ambulatory Visit: Payer: Self-pay | Admitting: Internal Medicine

## 2017-01-15 DIAGNOSIS — N183 Chronic kidney disease, stage 3 (moderate): Secondary | ICD-10-CM

## 2017-01-15 DIAGNOSIS — L03116 Cellulitis of left lower limb: Secondary | ICD-10-CM

## 2017-01-15 DIAGNOSIS — E785 Hyperlipidemia, unspecified: Secondary | ICD-10-CM

## 2017-01-15 DIAGNOSIS — I10 Essential (primary) hypertension: Secondary | ICD-10-CM

## 2017-01-15 LAB — COMPREHENSIVE METABOLIC PANEL
ALK PHOS: 65 U/L (ref 38–126)
ALT: 26 U/L (ref 17–63)
AST: 21 U/L (ref 15–41)
Albumin: 3.2 g/dL — ABNORMAL LOW (ref 3.5–5.0)
Anion gap: 9 (ref 5–15)
BILIRUBIN TOTAL: 0.7 mg/dL (ref 0.3–1.2)
BUN: 89 mg/dL — AB (ref 6–20)
CALCIUM: 8.9 mg/dL (ref 8.9–10.3)
CO2: 25 mmol/L (ref 22–32)
CREATININE: 3 mg/dL — AB (ref 0.61–1.24)
Chloride: 103 mmol/L (ref 101–111)
GFR, EST AFRICAN AMERICAN: 25 mL/min — AB (ref >60)
GFR, EST NON AFRICAN AMERICAN: 22 mL/min — AB (ref >60)
Glucose, Bld: 217 mg/dL — ABNORMAL HIGH (ref 65–99)
Potassium: 3.7 mmol/L (ref 3.5–5.1)
Sodium: 137 mmol/L (ref 135–145)
Total Protein: 6.3 g/dL — ABNORMAL LOW (ref 6.5–8.1)

## 2017-01-15 LAB — CBC WITH DIFFERENTIAL/PLATELET
Basophils Absolute: 0 10*3/uL (ref 0.0–0.1)
Basophils Relative: 1 %
EOS PCT: 5 %
Eosinophils Absolute: 0.3 10*3/uL (ref 0.0–0.7)
HEMATOCRIT: 32.1 % — AB (ref 39.0–52.0)
HEMOGLOBIN: 10.8 g/dL — AB (ref 13.0–17.0)
LYMPHS ABS: 1.9 10*3/uL (ref 0.7–4.0)
LYMPHS PCT: 33 %
MCH: 27.6 pg (ref 26.0–34.0)
MCHC: 33.6 g/dL (ref 30.0–36.0)
MCV: 82.1 fL (ref 78.0–100.0)
Monocytes Absolute: 0.7 10*3/uL (ref 0.1–1.0)
Monocytes Relative: 12 %
NEUTROS ABS: 2.9 10*3/uL (ref 1.7–7.7)
Neutrophils Relative %: 49 %
PLATELETS: 161 10*3/uL (ref 150–400)
RBC: 3.91 MIL/uL — AB (ref 4.22–5.81)
RDW: 13.7 % (ref 11.5–15.5)
WBC: 5.9 10*3/uL (ref 4.0–10.5)

## 2017-01-15 LAB — WOUND CULTURE

## 2017-01-15 LAB — GLUCOSE, CAPILLARY
Glucose-Capillary: 188 mg/dL — ABNORMAL HIGH (ref 65–99)
Glucose-Capillary: 214 mg/dL — ABNORMAL HIGH (ref 65–99)

## 2017-01-15 LAB — MAGNESIUM: MAGNESIUM: 2.2 mg/dL (ref 1.7–2.4)

## 2017-01-15 LAB — PHOSPHORUS: PHOSPHORUS: 4.4 mg/dL (ref 2.5–4.6)

## 2017-01-15 MED ORDER — AMOXICILLIN-POT CLAVULANATE 500-125 MG PO TABS: 1.0000 | ORAL_TABLET | Freq: Two times a day (BID) | ORAL | 0 refills | Status: DC

## 2017-01-15 MED ORDER — COLLAGENASE 250 UNIT/GM EX OINT: TOPICAL_OINTMENT | Freq: Every day | CUTANEOUS | 3 refills | Status: DC

## 2017-01-15 MED ORDER — DOXYCYCLINE HYCLATE 100 MG PO CAPS: 100.0000 mg | ORAL_CAPSULE | Freq: Two times a day (BID) | ORAL | 0 refills | Status: DC

## 2017-01-15 MED ORDER — INSULIN DETEMIR 100 UNIT/ML ~~LOC~~ SOLN: SUBCUTANEOUS | 1 refills | Status: DC

## 2017-01-15 MED ORDER — COLLAGENASE 250 UNIT/GM EX OINT
TOPICAL_OINTMENT | Freq: Every day | CUTANEOUS | Status: DC
  Filled 2017-01-15: qty 30

## 2017-01-15 MED ORDER — TRAMADOL HCL 50 MG PO TABS: 50.0000 mg | ORAL_TABLET | Freq: Four times a day (QID) | ORAL | 0 refills | Status: DC | PRN

## 2017-01-15 MED ORDER — TORSEMIDE 20 MG PO TABS
40.0000 mg | ORAL_TABLET | Freq: Every day | ORAL | 3 refills | Status: DC
Start: 2017-01-15 — End: 2017-08-17

## 2017-01-15 MED ORDER — FREESTYLE LIBRE 14 DAY SENSOR MISC: 1.0000 | 11 refills | Status: DC

## 2017-01-15 NOTE — Progress Notes (Signed)
Patient has met discharge criteria at this time. Discharge instructions discussed with patient and family.  Patient and family verbalized understanding of instructions.  Paper prescriptions given to patient for following medications: Santyl, Ultram, Amoxicillin, Levemir.  Patient instructed to call office to schedule follow up appointment with surgeon. Denies any further questions or concerns.  Patient discharged home with family via wheelchair.  Instructed to call office with any further questions or concerns. Supplies for wound care given to patient by Wound care RN.

## 2017-01-15 NOTE — Consult Note (Addendum)
[place] Nurse wound follow up  Xray/MRI negative for osteomyelitis.  Wound type: neuropathic foot ulceration Conservative sharp wound debridement (CSWD performed at the bedside): cleansed area with betadine, trimmed away loose nonviable tissue. Underlying tissue is soft with some grey/yellow tissue at the base of the 5th metatarsal head, plantar surface  Measurement: 1.5cm x 3.0cm x 0.5cm  Wound bed: see above  Drainage (amount, consistency, odor) minimal, slight odor.  Periwound: webspace between the 4th and 5th toes are macerated, but after I have cleaned do not see any active drainage.  Feel that now that roof of blistered area has been removed that he should not collect drainage in this area now. Erythema has much improved and pain is better.  Dressing procedure/placement/frequency:  1. Clean wound with normal saline, making sure to dry between the 4th and 5th toes well. 2. Cut ribbon/strip of silver hydrofiber (Aquacel Ag+) and weave between 3-4-5 toes, to treat 4th and 5th web space. 3. Apply 1/4" thick layer of Santyl to the open area left dorsal foot, just under 5th toe. Cover with dry dressing 4. Change daily Teach patient's family/daughter to perform        Collaboration with Dr. Meridee Score on needed orthotic for offloading ulceration.  Orders placed and contact made with orthopedic tech inpatient who will order needed offloading shoe for this patient.   Will need follow up with podiatrist and/or wound care center. Patient indicates prefers podiatrist so that has long term follow up for his feet with his history of DM.  Education provided to patient on the importance of caring for his feet, maintaining good BS control, reports current HbAIC 7.  Daily inspection and foot protection needed.  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  Life long care and monitoring of his feet will be important. Educated patient on the need for limited weight bearing on  the wound.  Offloading shoe to be fit for patient, hopefully today.  If today patient hoping to go home today. Dr. Tana Coast in the room with [facility] nurse for assessment. Plans to DC today if offloading shoe can be fit for patient today.   He reports his daughter is a 2H nurse on the weekends and will be able learn dressing changes. Does not need HHRN for wound care if daughter can assume responsibility for care.  Patient can take remaining supplies (Silver hydrofiber and Santyl) home for use and they can purchase OTC gauze, cover wrap dressings.   Discussed POC with patient and bedside nurse.  Re consult if needed, will not follow at this time. Thanks  Alec Mcphee [employer], RN,CWOCN, CNS, [place] [phone])

## 2017-01-15 NOTE — Progress Notes (Signed)
Orthopedic Tech Progress Note Patient Details:  Samuel Reed [birth year] [id number]  Patient ID: [name], male   DOB: Aug 11, 1960, 56 y.o.   MRN: [id number]   [place] Pink 01/15/2017, 10:01 AMCalled [employer] for Left offloading shoe.

## 2017-01-15 NOTE — Telephone Encounter (Signed)
Patient wants a script for the 14 day Libre (not 10 day) Libre-please send to [facility] in [place] Patient out of [place] Had A1C checked at [facility] while in hospital and it was 7.0

## 2017-01-15 NOTE — Discharge Instructions (Signed)

## 2017-01-15 NOTE — Discharge Summary (Signed)
Physician Discharge Summary   Patient ID: Samuel Reed MRN: [id number] DOB/AGE: 1960-06-02 56 y.o.  Admit date: 01/13/2017 Discharge date: 01/15/2017  Primary Care Physician:  [name], MD  Discharge Diagnoses:    . Left foot cellulitis with neuropathic foot ulceration . Chronic kidney disease, stage III (moderate) (HCC) . Essential hypertension, benign . Hyperlipemia . Left foot infection . Polyneuropathy, diabetic ([place]) Diabetes mellitus, insulin-dependent  Consults: Wound care  Recommendations for Outpatient Follow-up:  1. Patient was recommended to follow-up with podiatry and wound care 2. Please repeat CBC/BMET at next visit   DIET: Carb modified    Allergies:  No Known Allergies   DISCHARGE MEDICATIONS: Allergies as of 01/15/2017   No Known Allergies     Medication List    TAKE these medications   amLODipine 10 MG tablet Commonly known as:  NORVASC TAKE 1 TABLET (10 MG TOTAL) BY MOUTH DAILY.   amoxicillin-clavulanate 500-125 MG tablet Commonly known as:  AUGMENTIN Take 1 tablet (500 mg total) by mouth 2 (two) times daily. FOR 2 WEEKS   aspirin EC 81 MG tablet Take 81 mg by mouth daily. Notes to patient:  Last given today at 0753 am   collagenase ointment Commonly known as:  SANTYL Apply topically daily. Apply to open foot wound on the left foot.   doxazosin 4 MG tablet Commonly known as:  CARDURA TAKE 1 TABLET (4 MG TOTAL) BY MOUTH AT BEDTIME. Notes to patient:  Given yesterday at 9:09 pm   Ferrous Gluconate 324 (37.5 Fe) MG Tabs Take 2 tablets by mouth daily. Notes to patient:  Yesterday at 5:18 pm   fluticasone 50 MCG/ACT nasal spray Commonly known as:  FLONASE Place 1 spray into both nostrils 2 (two) times daily. Notes to patient:  Given today at 0755 am   hydrALAZINE 50 MG tablet Commonly known as:  APRESOLINE TAKE 1 TABLET BY MOUTH 3 TIMES A DAY What changed:    how much to take  how to take this  when to take  this Notes to patient:  Given today at 0753 am   insulin detemir 100 UNIT/ML injection Commonly known as:  LEVEMIR INJECT 50 UNITS INTO THE SKIN AT BEDTIME.   lisinopril-hydrochlorothiazide 20-12.5 MG tablet Commonly known as:  PRINZIDE,ZESTORETIC TAKE 2 TABLETS BY MOUTH EVERY DAY Notes to patient:  Given at 7:54 am today   metoprolol tartrate 100 MG tablet Commonly known as:  LOPRESSOR TAKE 1 TABLET (100 MG TOTAL) BY MOUTH 2 (TWO) TIMES DAILY. Notes to patient:  Given today at 7:54 am   NOVOLOG FLEXPEN 100 UNIT/ML FlexPen Generic drug:  insulin aspart INJECT 10-20 UNITS INTO THE SKIN 3(THREE) TIMES DAILY WITH MEALS AS INSTRUCTED. Notes to patient:  Given today at 7:54 am   PRESCRIPTION MEDICATION Cpap   rosuvastatin 20 MG tablet Commonly known as:  CRESTOR TAKE 1 TABLET BY MOUTH EVERY DAY What changed:    how much to take  how to take this  when to take this Notes to patient:  Given today at 7:54 am   torsemide 20 MG tablet Commonly known as:  DEMADEX Take 2 tablets (40 mg total) by mouth daily. HOLD until follow up appt with your doctor What changed:  additional instructions   traMADol 50 MG tablet Commonly known as:  ULTRAM Take 1 tablet (50 mg total) by mouth every 6 (six) hours as needed for moderate pain or severe pain.        Brief H and P:  For complete details please refer to admission H and P, but in brief Samuel Dubberly McKinneyis a 56 y.o.malewith medical history significant ofHTN, HLD, DM Type 2, CKD Stage 3 and other comorbids who comes in with about 2 days of left foot starting to get red and purulent discharge from wound to bottom of foot. He started having chills and fevers last night. Has cellulitis up to ankle. No previous infection to this foot. Referred for admission for IV Abx for Cellulitis. No previous oral antibiotics recently.    Hospital Course:    Acute Left Foot Cellulitis in the setting of insulin-dependent diabetes,  neuropathy -X-ray of the left foot showed old fifth proximal metatarsal fracture with callus formation but no osteomyelitis.  Swelling of the fourth and fifth toes consistent with infection/cellulitis, no lytic destruction. -Patient was placed on IV Rocephin and IV Flagyl, wound care was consulted. -ABIs were normal.  MRI of the foot was negative for osteomyelitis. -Patient was started on Santyl enzymatic debridement by wound care and instructions were placed in the discharge AVS. patient reported that his daughter is a Marine scientist and will be able to help him with dressing changes. -Discussed with Dr. Johnnye Sima, placed on Augmentin for 2 weeks at the time of discharge. -Also recommended to see podiatry, Dr. Amalia Hailey outpatient, placed in left offloading shoe by ortho tech  Essential Hypertension -Continue amlodipine, doxazosin, hydralazine.  Continue lisinopril, metoprolol. -Hold torsemide until creatinine function improves  Uncontrolled Type 2 Diabetes Mellitus with Stage 3 Chronic Kidney Disease, neuropathy -Hemoglobin A1c was 7.0, continue Lantus, NovoLog sliding scale with meals -Patient recommended to follow-up with his PCP for further adjustment of the insulin   Stage 3 CKD -Baseline creatinine 1.8-2.7  -Presented with creatinine of 3.36, improving to 3.0 at the time of discharge.   -Continue to hold torsemide until creatinine improves.  HLD -Continue statin  Hypokalemia -Resolved with replacement   Normocytic Anemia -Likely Anemia of Chronic Disease -Hb/Hct went from 11.9/36.4 -> 11.6/35.4 -Continue ferrous gluconate     Day of Discharge BP (!) 153/82   Pulse 66   Temp 98.5 F (36.9 C) (Oral)   Resp 17   Ht 5' 11.75" (1.822 m)   Wt 111.7 kg (246 lb 4.1 oz)   SpO2 98%   BMI 33.63 kg/m   Physical Exam: General: Alert and awake oriented x3 not in any acute distress. HEENT: anicteric sclera, pupils reactive to light and accommodation CVS: S1-S2 clear no murmur rubs  or gallops Chest: clear to auscultation bilaterally, no wheezing rales or rhonchi Abdomen: soft nontender, nondistended, normal bowel sounds Extremities: no cyanosis, clubbing or edema noted bilaterally Neuro: Cranial nerves II-XII intact, no focal neurological deficits Skin: Ulcer on the plantar aspect of the left foot near fifth and fourth toe with necrotic changes   The results of significant diagnostics from this hospitalization (including imaging, microbiology, ancillary and laboratory) are listed below for reference.    LAB RESULTS: Basic Metabolic Panel: Recent Labs  Lab 01/14/17 0718  01/15/17 0342  NA 138  --  137  K 3.2*  --  3.7  CL 100*  --  103  CO2 28  --  25  GLUCOSE 222*  --  217*  BUN 87*  --  89*  CREATININE 3.27*  --  3.00*  CALCIUM 9.0  --  8.9  MG  --    < > 2.2  PHOS  --    < > 4.4   < > = values in  this interval not displayed.   Liver Function Tests: Recent Labs  Lab 01/13/17 1436 01/15/17 0342  AST 27 21  ALT 27 26  ALKPHOS 75 65  BILITOT 1.4* 0.7  PROT 7.5 6.3*  ALBUMIN 3.8 3.2*   No results for input(s): LIPASE, AMYLASE in the last 168 hours. No results for input(s): AMMONIA in the last 168 hours. CBC: Recent Labs  Lab 01/14/17 0718 01/15/17 0342  WBC 5.7 5.9  NEUTROABS  --  2.9  HGB 11.6* 10.8*  HCT 35.4* 32.1*  MCV 83.1 82.1  PLT 153 161   Cardiac Enzymes: No results for input(s): CKTOTAL, CKMB, CKMBINDEX, TROPONINI in the last 168 hours. BNP: Invalid input(s): POCBNP CBG: Recent Labs  Lab 01/15/17 0656 01/15/17 1213  GLUCAP 188* 214*    Significant Diagnostic Studies:  Dg Chest 2 View  Result Date: 01/13/2017 CLINICAL DATA:  Fever, unspecified cause. Lower respiratory infection. EXAM: CHEST  2 VIEW COMPARISON:  12/14/2016 and 02/12/2015. FINDINGS: The heart size and mediastinal contours are stable. The lungs are mildly hyperinflated but clear. There is no pleural effusion or pneumothorax. No acute osseous findings are  demonstrated. There are degenerative changes throughout the thoracic spine. IMPRESSION: Stable chest without acute findings. Suspected mild chronic obstructive pulmonary disease. Electronically Signed   By: Richardean Sale M.D.   On: 01/13/2017 12:45   Mr Foot Left Wo Contrast  Result Date: 01/14/2017 CLINICAL DATA:  Fifth digit plantar wound. Evaluate for osteomyelitis. EXAM: MRI OF THE LEFT FOOT WITHOUT CONTRAST TECHNIQUE: Multiplanar, multisequence MR imaging of the left forefoot was performed. No intravenous contrast was administered. COMPARISON:  Left foot x-rays dated January 13, 2017. FINDINGS: Bones/Joint/Cartilage Increased marrow edema within the third, fourth, and fifth metatarsal shafts. Chronic, nonunited fracture of the fifth metatarsal proximal diaphysis. Faint marrow edema within the middle cuneiform and cuboid. No acute fracture or malalignment. No evidence of osteomyelitis. No joint effusion. Ligaments The Lisfranc ligament is intact. The collateral ligaments are intact. Muscles and Tendons Increased T2 signal within the intrinsic musculature of the forefoot, consistent with diabetic muscle changes. Intact flexor, peroneal, and extensor tendons without evidence of tenosynovitis. Soft tissues Mild dorsal forefoot soft tissue swelling, predominantly over the lateral aspect, extending into the fifth toe. No drainable fluid collection. IMPRESSION: 1. Mild dorsal forefoot soft tissue swelling, predominantly over the lateral aspect, extending into the fifth toe. Findings are nonspecific, but can be seen with cellulitis. No drainable fluid collection. No evidence of osteomyelitis. 2. Increased marrow signal within the third, fourth, and fifth metatarsal shafts is likely stress related. No acute fracture. 3. Chronic, nonunited fracture of the fifth metatarsal proximal diaphysis (Jones fracture). Electronically Signed   By: Titus Dubin M.D.   On: 01/14/2017 19:42   Dg Foot Complete  Left  Result Date: 01/13/2017 CLINICAL DATA:  Left foot infection. EXAM: LEFT FOOT - COMPLETE 3+ VIEW COMPARISON:  Radiographs of July 03, 2016. FINDINGS: Old proximal fifth metatarsal fracture is noted with callus formation, but persistent fracture line remains. Moderate spurring of posterior calcaneus is noted. No new fracture or other bony abnormality is noted. Soft tissue swelling of the fourth and fifth toes is noted suggesting inflammation or infection. No lytic destruction is seen to suggest osteomyelitis. IMPRESSION: Old fifth proximal metatarsal fracture is noted with callus formation, but persistent fracture line and nonunion remains. Swelling of fourth and fifth toes is noted consistent with infection or cellulitis, but no lytic destruction is seen to suggest osteomyelitis. Electronically Signed   By:  Marijo Conception, M.D.   On: 01/13/2017 12:29    2D ECHO:   Disposition and Follow-up: Discharge Instructions    Diet - low sodium heart healthy   Complete by:  As directed    Discharge instructions   Complete by:  As directed    WOUND CARE : Dressing procedure/placement/frequency:  1. Clean wound with normal saline, making sure to dry between the 4th and 5th toes well. 2. Cut ribbon/strip of silver hydrofiber (Aquacel Ag+) and weave between 3-4-5 toes, to treat 4th and 5th web space. 3. Apply 1/4" thick layer of Santyl to the open area left dorsal foot, just under 5th toe. Cover with dry dressing 4. Change daily   Increase activity slowly   Complete by:  As directed        DISPOSITION: home    [place]    [name], MD. Schedule an appointment as soon as possible for a visit in 2 week(s).   Specialties:  Internal Medicine, Pediatrics Contact information: [place] [address] [phone]        Edrick Kins, DPM. Schedule an appointment as soon as possible for a visit in 14 day(s).   Specialty:   Podiatry Contact information: [address] [place] [address] [phone]            Time spent on Discharge: 35 mins   Signed:   Estill Cotta M.D. [facility] 01/15/2017, 12:46 PM Pager: [phone]

## 2017-01-15 NOTE — Progress Notes (Signed)
Paged Ortho tech.  Stated that she had contacted outside vendor about an hour ago for them to come and fit patient with brace.

## 2017-01-15 NOTE — Telephone Encounter (Signed)
I sent it  

## 2017-01-15 NOTE — Telephone Encounter (Signed)
Is it okay to send this in? We have never prescriped the [facility] sensor/device for him.

## 2017-01-18 ENCOUNTER — Telehealth: Payer: Self-pay | Admitting: *Deleted

## 2017-01-18 LAB — CULTURE, BLOOD (ROUTINE X 2)
Culture: NO GROWTH
Culture: NO GROWTH
Special Requests: ADEQUATE
Special Requests: ADEQUATE

## 2017-01-18 NOTE — Telephone Encounter (Signed)
Lm requesting return call to complete TCM and confirm hosp f/u appt  

## 2017-01-22 ENCOUNTER — Telehealth: Payer: Self-pay | Admitting: Internal Medicine

## 2017-01-22 MED ORDER — FREESTYLE LIBRE 14 DAY READER DEVI: 1.0000 | Freq: Once | 0 refills | Status: AC

## 2017-01-22 NOTE — Telephone Encounter (Signed)
For now, let's add him to the cancellation list but I will keep an eye on my schedule and let you know if something comes up.

## 2017-01-22 NOTE — Telephone Encounter (Signed)
Pt is picking up the printed RX so that he can use it with his voucher.  Dr.Gherghe, his appt was canceled due to snow and he was not rescheduled. He would like to come in sooner than the first available which is 03/26/17. Please advise on when we may could fit him in  Thank you

## 2017-01-22 NOTE — Telephone Encounter (Signed)
Pt stated he was prescribed the following sensor Continuous Blood Gluc Sensor (FREESTYLE LIBRE 14 DAY SENSOR) MISC  He is needing the meter for that. He says he has a voucher to get the meter for free and wants to know if there is away we can send the script over to him somehow so he can use that voucher, [facility] he states he will not get it for free   Please advise.

## 2017-01-22 NOTE — Telephone Encounter (Signed)
Can you please add him to the list, I am unsure how to do that. Thank you

## 2017-01-26 ENCOUNTER — Other Ambulatory Visit: Payer: Self-pay | Admitting: Internal Medicine

## 2017-01-27 NOTE — Telephone Encounter (Signed)
Pt declined earlier appointment and now says he will just come in March.

## 2017-02-01 ENCOUNTER — Encounter: Payer: Self-pay | Admitting: Podiatry

## 2017-02-01 ENCOUNTER — Ambulatory Visit: Payer: 59 | Admitting: Podiatry

## 2017-02-01 DIAGNOSIS — M79676 Pain in unspecified toe(s): Secondary | ICD-10-CM

## 2017-02-01 DIAGNOSIS — L989 Disorder of the skin and subcutaneous tissue, unspecified: Secondary | ICD-10-CM

## 2017-02-01 DIAGNOSIS — I70245 Atherosclerosis of native arteries of left leg with ulceration of other part of foot: Secondary | ICD-10-CM | POA: Diagnosis not present

## 2017-02-01 DIAGNOSIS — E0843 Diabetes mellitus due to underlying condition with diabetic autonomic (poly)neuropathy: Secondary | ICD-10-CM

## 2017-02-01 DIAGNOSIS — B351 Tinea unguium: Secondary | ICD-10-CM

## 2017-02-03 NOTE — Progress Notes (Signed)
Subjective: Patient is a 57 y.o. male with past medical history of type 2 diabetes presenting to the office today as a new patient with a chief complaint of painful callus lesions to the right foot that have been present for several months.  He also reports a lesion to the left forefoot that appeared 3 weeks ago.  He states he was seen in the ED and admitted on 01/17/17 and was discharged on 01/21/17 for cellulitis.  He was discharged with a prescription of Augmentin which he reports taking and finishing as directed.  He states he has also been applying Santyl lotion. Patient also complains of an elongated, thickened left great toenail that causes pain while ambulating in shoes. Patient is unable to trim his own nails. Patient presents today for further treatment and evaluation.   Past Medical History:  Diagnosis Date  . Chronic venous insufficiency   . CKD (chronic kidney disease), stage III ([place])   . Diabetes mellitus type II 2003  . HLD (hyperlipidemia)   . HTN (hypertension) 1980's  . OSA (obstructive sleep apnea)   . Sleep apnea     Objective:  Physical Exam General: Alert and oriented x3 in no acute distress  Dermatology: Hyperkeratotic lesions present on the right foot x2. Pain on palpation with a central nucleated core noted. Skin is warm, dry and supple bilateral lower extremities. Negative for open lesions or macerations.  Left great toenail is tender, long, thickened and dystrophic with subungual debris, consistent with onychomycosis. No signs of infection noted.  Wound #1 noted to the left foot measuring 0.5 x 0.5 x 0.2 cm.  To the above-noted ulceration, there is no eschar. There is a moderate amount of slough, fibrin and necrotic tissue. Granulation tissue and wound base is red. There is no malodor. There is a minimal amount of serosanginous drainage noted. Periwound integrity is intact.   Vascular: Palpable pedal pulses bilaterally. No edema or erythema noted. Capillary  refill within normal limits.  Neurological: Epicritic and protective threshold grossly intact bilaterally.   Musculoskeletal Exam: Pain on palpation at the keratotic lesion noted. Range of motion within normal limits bilateral. Muscle strength 5/5 in all groups bilateral.  Assessment: 1. Onychodystrophic left great toenail with hyperkeratosis of nail.  2. Onychomycosis of left great toenail due to dermatophyte bilateral 3.  Pre-ulcerative callus to the right foot x2 4.  Ulceration of the left foot secondary to diabetes mellitus   Plan of Care:  #1 Patient evaluated. #2 Excisional debridement of keratoic lesion using a chisel blade was performed without incident.  #3 Dressed with light dressing. #4 Mechanical debridement of left great toenail performed using a nail nipper. Filed with dremel without incident.  #5 Medically necessary excisional debridement including subcutaneous tissue was performed using a tissue nipper and a chisel blade. Excisional debridement of all the necrotic nonviable tissue down to healthy bleeding viable tissue was performed with post-debridement measurements same as pre-. #6 the wound was cleansed and dry sterile dressing applied. #7 Continue using Santyl lotion and dry sterile dressing daily. #8 return to clinic in 3 weeks.  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   Edrick Kins, DPM [facility]  Dr. Edrick Kins, DPM    [address]  [place], [place] [address]                Office [phone]  Fax [phone]   

## 2017-02-04 ENCOUNTER — Inpatient Hospital Stay: Payer: Self-pay | Admitting: Internal Medicine

## 2017-02-09 ENCOUNTER — Other Ambulatory Visit: Payer: 59

## 2017-02-12 ENCOUNTER — Telehealth: Payer: Self-pay | Admitting: Internal Medicine

## 2017-02-12 MED ORDER — INSULIN LISPRO 100 UNIT/ML (KWIKPEN): PEN_INJECTOR | SUBCUTANEOUS | 1 refills | Status: DC

## 2017-02-12 NOTE — Telephone Encounter (Signed)
OK to send Humalog

## 2017-02-12 NOTE — Telephone Encounter (Signed)
Samuel Reed [phone]  [facility]   NOVOLOG FLEXPEN 100 UNIT/ML FlexPen  Patient called to say that his insurance no longer covers this medication.

## 2017-02-12 NOTE — Telephone Encounter (Signed)
Pt is aware and medication sent

## 2017-02-12 NOTE — Telephone Encounter (Signed)
Please advise on below  

## 2017-02-22 ENCOUNTER — Other Ambulatory Visit: Payer: Self-pay | Admitting: Internal Medicine

## 2017-02-22 ENCOUNTER — Ambulatory Visit: Payer: 59 | Admitting: Podiatry

## 2017-02-22 ENCOUNTER — Encounter: Payer: Self-pay | Admitting: Podiatry

## 2017-02-22 VITALS — BP 155/82 | HR 61

## 2017-02-22 DIAGNOSIS — L97522 Non-pressure chronic ulcer of other part of left foot with fat layer exposed: Secondary | ICD-10-CM

## 2017-02-22 DIAGNOSIS — I70245 Atherosclerosis of native arteries of left leg with ulceration of other part of foot: Secondary | ICD-10-CM

## 2017-02-22 DIAGNOSIS — E0843 Diabetes mellitus due to underlying condition with diabetic autonomic (poly)neuropathy: Secondary | ICD-10-CM

## 2017-02-22 MED ORDER — COLLAGENASE 250 UNIT/GM EX OINT: TOPICAL_OINTMENT | Freq: Every day | CUTANEOUS | 3 refills | Status: DC

## 2017-02-23 ENCOUNTER — Other Ambulatory Visit: Payer: 59

## 2017-02-24 NOTE — Progress Notes (Signed)
   Subjective:  Patient with a history of diabetes mellitus presents today for follow up evaluation os an ulceration to the left foot. He states he is doing well and reports the wound has almost healed. He states he does not want to have to change the dressing daily. Patient presents today for further treatment and evaluation.   Past Medical History:  Diagnosis Date  . Chronic venous insufficiency   . CKD (chronic kidney disease), stage III ([place])   . Diabetes mellitus type II 2003  . HLD (hyperlipidemia)   . HTN (hypertension) 1980's  . OSA (obstructive sleep apnea)   . Sleep apnea       Objective/Physical Exam General: The patient is alert and oriented x3 in no acute distress.  Dermatology:  Wound #1 noted to the left foot measuring 0.3 x 0.2 x 0.1 cm (LxWxD).   To the noted ulceration(s), there is no eschar. There is a moderate amount of slough, fibrin, and necrotic tissue noted. Granulation tissue and wound base is red. There is a minimal amount of serosanguineous drainage noted. There is no exposed bone muscle-tendon ligament or joint. There is no malodor. Periwound integrity is intact. Skin is warm, dry and supple bilateral lower extremities.  Vascular: Palpable pedal pulses bilaterally. No edema or erythema noted. Capillary refill within normal limits.  Neurological: Epicritic and protective threshold absent bilaterally.   Musculoskeletal Exam: Range of motion within normal limits to all pedal and ankle joints bilateral. Muscle strength 5/5 in all groups bilateral.   Assessment: #1 left foot ulceration secondary to diabetes mellitus #2 diabetes mellitus w/ peripheral neuropathy   Plan of Care:  #1 Patient was evaluated. #2 medically necessary excisional debridement including subcutaneous tissue was performed using a tissue nipper and a chisel blade. Excisional debridement of all the necrotic nonviable tissue down to healthy bleeding viable tissue was performed with  post-debridement measurements same as pre-. #3 the wound was cleansed and dry sterile dressing applied. #4 Continue using Santyl with a dry sterile dressing daily. Bea Graff appointment with Benjie Karvonen on 02/23/17 for DM shoes and insoles. #6 Refill prescription for Santyl provided to patient.  #7 Return to clinic in 3 weeks.  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    Edrick Kins, DPM [facility]  Dr. Edrick Kins, [address]                                        [place], [place] [address]                Office [phone]  Fax [phone]

## 2017-03-04 ENCOUNTER — Ambulatory Visit: Payer: 59 | Admitting: Internal Medicine

## 2017-03-15 ENCOUNTER — Ambulatory Visit: Payer: 59 | Admitting: Podiatry

## 2017-03-15 ENCOUNTER — Encounter: Payer: Self-pay | Admitting: Podiatry

## 2017-03-15 DIAGNOSIS — I70245 Atherosclerosis of native arteries of left leg with ulceration of other part of foot: Secondary | ICD-10-CM | POA: Diagnosis not present

## 2017-03-15 DIAGNOSIS — L989 Disorder of the skin and subcutaneous tissue, unspecified: Secondary | ICD-10-CM | POA: Diagnosis not present

## 2017-03-15 DIAGNOSIS — E0843 Diabetes mellitus due to underlying condition with diabetic autonomic (poly)neuropathy: Secondary | ICD-10-CM

## 2017-03-15 DIAGNOSIS — L97522 Non-pressure chronic ulcer of other part of left foot with fat layer exposed: Secondary | ICD-10-CM

## 2017-03-15 DIAGNOSIS — L84 Corns and callosities: Secondary | ICD-10-CM | POA: Diagnosis not present

## 2017-03-15 DIAGNOSIS — E1142 Type 2 diabetes mellitus with diabetic polyneuropathy: Secondary | ICD-10-CM | POA: Diagnosis not present

## 2017-03-17 NOTE — Progress Notes (Signed)
   Subjective: Patient with diabetes mellitus presents to the office today for follow up evaluation of an ulceration to the left foot. He states the area is doing well. He is here to pick up his DM shoes. Patient is here for further evaluation and treatment.    Past Medical History:  Diagnosis Date  . Chronic venous insufficiency   . CKD (chronic kidney disease), stage III ([place])   . Diabetes mellitus type II 2003  . HLD (hyperlipidemia)   . HTN (hypertension) 1980's  . OSA (obstructive sleep apnea)   . Sleep apnea     Objective:  Physical Exam General: Alert and oriented x3 in no acute distress  Dermatology: Hyperkeratotic lesion present on the left foot. Pain on palpation with a central nucleated core noted.  Skin is warm, dry and supple bilateral lower extremities. Negative for open lesions or macerations.  Vascular: Palpable pedal pulses bilaterally. No edema or erythema noted. Capillary refill within normal limits.  Neurological: Epicritic and protective threshold diminished bilaterally.   Musculoskeletal Exam: Pain on palpation at the keratotic lesion noted. Range of motion within normal limits bilateral. Muscle strength 5/5 in all groups bilateral.  Assessment: #1 left foot ulceration secondary to diabetes mellitus - resolved #2 Diabetes mellitus w/ peripheral neuropathy #3 Pre-ulcerative callus lesion left foot    Plan of Care:  #1 Patient evaluated #2 Excisional debridement of keratotic lesion using a chisel blade was performed without incident.  #3 Dressed area with light dressing. #4 DM shoes dispensed today.  #5 Patient is to return to the clinic PRN.    Edrick Kins, DPM [facility]  Dr. Edrick Kins, [address]                                        [place], [place] [address]                Office [phone]  Fax [phone]

## 2017-03-23 ENCOUNTER — Ambulatory Visit: Payer: 59 | Admitting: Internal Medicine

## 2017-03-23 DIAGNOSIS — Z0289 Encounter for other administrative examinations: Secondary | ICD-10-CM

## 2017-03-30 ENCOUNTER — Ambulatory Visit: Payer: Self-pay | Admitting: Internal Medicine

## 2017-04-06 DIAGNOSIS — E1122 Type 2 diabetes mellitus with diabetic chronic kidney disease: Secondary | ICD-10-CM | POA: Diagnosis not present

## 2017-04-06 DIAGNOSIS — I10 Essential (primary) hypertension: Secondary | ICD-10-CM | POA: Diagnosis not present

## 2017-04-06 DIAGNOSIS — N183 Chronic kidney disease, stage 3 (moderate): Secondary | ICD-10-CM | POA: Diagnosis not present

## 2017-04-19 ENCOUNTER — Other Ambulatory Visit: Payer: Self-pay | Admitting: Internal Medicine

## 2017-04-20 DIAGNOSIS — G4733 Obstructive sleep apnea (adult) (pediatric): Secondary | ICD-10-CM | POA: Diagnosis not present

## 2017-05-04 DIAGNOSIS — E113393 Type 2 diabetes mellitus with moderate nonproliferative diabetic retinopathy without macular edema, bilateral: Secondary | ICD-10-CM | POA: Diagnosis not present

## 2017-05-07 ENCOUNTER — Ambulatory Visit: Payer: Self-pay | Admitting: Internal Medicine

## 2017-05-19 ENCOUNTER — Ambulatory Visit: Payer: Self-pay | Admitting: Internal Medicine

## 2017-06-01 ENCOUNTER — Encounter: Payer: Self-pay | Admitting: Internal Medicine

## 2017-06-01 ENCOUNTER — Ambulatory Visit: Payer: 59 | Admitting: Internal Medicine

## 2017-06-01 VITALS — BP 136/68 | HR 58 | Ht 71.75 in | Wt 268.8 lb

## 2017-06-01 DIAGNOSIS — E1122 Type 2 diabetes mellitus with diabetic chronic kidney disease: Secondary | ICD-10-CM

## 2017-06-01 DIAGNOSIS — Z794 Long term (current) use of insulin: Secondary | ICD-10-CM

## 2017-06-01 DIAGNOSIS — E669 Obesity, unspecified: Secondary | ICD-10-CM

## 2017-06-01 DIAGNOSIS — N183 Chronic kidney disease, stage 3 (moderate): Secondary | ICD-10-CM | POA: Diagnosis not present

## 2017-06-01 DIAGNOSIS — IMO0002 Reserved for concepts with insufficient information to code with codable children: Secondary | ICD-10-CM

## 2017-06-01 DIAGNOSIS — E785 Hyperlipidemia, unspecified: Secondary | ICD-10-CM

## 2017-06-01 DIAGNOSIS — E1165 Type 2 diabetes mellitus with hyperglycemia: Secondary | ICD-10-CM | POA: Diagnosis not present

## 2017-06-01 LAB — POCT GLYCOSYLATED HEMOGLOBIN (HGB A1C): Hemoglobin A1C: 7.3

## 2017-06-01 NOTE — Patient Instructions (Addendum)
Please continue: - Levemir 50 units at bedtime - Mealtime insulin 8-20 units before meals.  Please return in 3-4 months with your sugar log.

## 2017-06-01 NOTE — Progress Notes (Signed)
Subjective:     Patient ID: Samuel Reed, male   DOB: 1961-01-02, 57 y.o.   MRN: [id number]  Diabetes    Samuel Reed is a 57 y.o. man returning for management of DM2, dx 2003, insulin-dependent, uncontrolled, with complications (nephropathy; retinopathy OU; neuropathy). Last visit was 8 mo ago.  He restarted to go to the gym in last 1.5 weeks. He is still eating mostly plant based + chicken. Avoids carbs.  Last HbA1C: Lab Results  Component Value Date   HGBA1C 7.0 (H) 01/13/2017   HGBA1C 8.5 (H) 08/07/2016   HGBA1C 8.9 04/20/2016   He is on a regimen of:  stopped 07/2016 b/c high Cr - Levemir 65 >> 50 units at bedtime - Mealtime insulin 09-10-18 units before the meal >> Humalog We stopped Tradjenta and Glipizide XL in 10/2013.  Not checking sugars at all now. He had a freestyle libre CGM before, but he was not happy with it as he mentioned it was not accurate for him. Reviewed sugars from last visit: - A.m. 122-160 (if late meal) >> n/c  - after b'fast: n/c >> 110-130 - before lunch: 115-120 >> 69, 100-150 - 2h after lunch: 208-303 >> n/c - Before dinner: n/c >> 100-130 - Nighttime:  Highest 200s >> 100-150s It is unclear at which level he has hypoglycemia awareness.  - he has CKD: Lab Results  Component Value Date   BUN 89 (H) 01/15/2017   CREATININE 3.00 (H) 01/15/2017  He on lisinopril.  08/07/2016: GFR: 25.33 He sees nephrology now. Lab Results  Component Value Date   GFRNONAA 22 (L) 01/15/2017   GFRNONAA 20 (L) 01/14/2017   GFRNONAA 19 (L) 01/13/2017   GFRNONAA 42 (L) 06/28/2015   GFRNONAA 41 (L) 02/12/2015   GFRNONAA 83 (L) 04/15/2012   GFRNONAA 80.01 11/13/2009   GFRNONAA 86 03/19/2008   GFRNONAA 77 02/17/2008   GFRNONAA 86 01/05/2008   + MAU: Lab Results  Component Value Date   MICRALBCREAT 143.5 (H) 06/26/2011   MICRALBCREAT 111.2 (H) 05/07/2010   MICRALBCREAT 856.1 (H) 08/06/2008   + Hyperlipidemia: Lab Results  Component Value Date   CHOL  80 08/07/2016   HDL 29.60 (L) 08/07/2016   LDLCALC 23 08/07/2016   LDLDIRECT 36.0 06/28/2015   TRIG 141.0 08/07/2016   CHOLHDL 3 08/07/2016  On Crestor 20. Last eye exam was: 12/2016: + DR, but improving  He also has OSA and is on CPAP. 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 Dr Rollene Fare saw him in the past for CP >> a cath was clean. He has OSA and wears CPAP every night.  Patient was admitted with chest pain in 01/2015. He had a cardiac cath in 01/2015 >> no stents or other interventions suggested. His cardiologist is Dr. Einar Gip: Conclusion     Prox to Mid LAD lesion, 30% mildly stenosed, but otherwise normal coronary arteries.  The left ventricular systolic function is normal. 30 mL contrast used.   Review of Systems  Constitutional: no weight gain/no weight loss, no fatigue, no subjective hyperthermia, no subjective hypothermia Eyes: no blurry vision, no xerophthalmia ENT: no sore throat, no nodules palpated in throat, no dysphagia, no odynophagia, no hoarseness Cardiovascular: no CP/no SOB/no palpitations/+ leg swelling Respiratory: no cough/no SOB/no wheezing Gastrointestinal: no N/no V/no D/no C/no acid reflux Musculoskeletal: no muscle aches/no joint aches Skin: no rashes, no hair loss Neurological: no tremors/no numbness/no tingling/no dizziness  I reviewed pt's medications, allergies, PMH, social hx, family  hx, and changes were documented in the history of present illness. Otherwise, unchanged from my initial visit note.   Objective:   Physical Exam BP 136/68   Pulse (!) 58   Ht 5' 11.75" (1.822 m)   Wt 268 lb 12.8 oz (121.9 kg)   SpO2 96%   BMI 36.71 kg/m  Body mass index is 36.71 kg/m.  Wt Readings from Last 3 Encounters:  06/01/17 268 lb 12.8 oz (121.9 kg)  01/14/17 246 lb 4.1 oz (111.7 kg)  01/13/17 246 lb 3.2 oz (111.7 kg)   Constitutional: overweight, in NAD Eyes: PERRLA, EOMI, no exophthalmos ENT: moist mucous membranes, no thyromegaly, no  cervical lymphadenopathy Cardiovascular: RRR, No MRG Respiratory: CTA B Gastrointestinal: abdomen soft, NT, ND, BS+ Musculoskeletal: no deformities, strength intact in all 4 Skin: moist, warm, no rashes Neurological: no tremor with outstretched hands, DTR normal in all 4  Assessment:     1. 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 Obesity class 1 (has boot on now) BMI Classification:  < 18.5 underweight   18.5-24.9 normal weight   25.0-29.9 overweight   30.0-34.9 class I obesity   35.0-39.9 class II obesity   ? 40.0 class III obesity   3. HL    Plan:  1. DM2 - pt with history of uncontrolled type 2 diabetes, with improved blood sugar control at last visit due to change in diet.  He started a more plant-based diet and was planning to restart exercise.  His HbA1c was much better, at 7%.  We did not make any changes in  his insulin regimen at that time - At this visit, he is not checking sugars at all and I strongly advised him to start - He is using different amounts of Humalog insulin depending on the size of his meals.  He is doing a good job with this.   - He started to go to the gym 1.5 weeks ago.  He feels that his sugars started to improve since then. - I suggested to: Patient Instructions  Please continue: - Levemir 50 units at bedtime - Mealtime insulin 8-20 units before meals.  Please return in 3-4 months with your sugar log.   - today, HbA1c is 7.3% (slightly higher) - Restart checking sugars at different times of the day - check 3x a day, rotating checks - advised for yearly eye exams >> he is UTD - Return to clinic in 3-4 mo with sugar log   2. Obesity class 1 -He started to lose weight after he changed his diet more plant-based one.  Although he is now  including some meat (chicken), he is still mostly following the diet.  He gained weight after last visit, however, 22 pounds! -Discussed about continuing a plant-based diet  -he just started to go back to the gym and I think this will help also  3. HL - Reviewed latest lipid panel from 07/2016: LDL at goal, HDL slightly low - Continues rosuvastatin 20 without side effects.   Philemon Kingdom, MD PhD [facility] Endocrinology

## 2017-06-14 ENCOUNTER — Other Ambulatory Visit: Payer: Self-pay | Admitting: Internal Medicine

## 2017-06-30 ENCOUNTER — Other Ambulatory Visit: Payer: Self-pay | Admitting: Internal Medicine

## 2017-07-15 DIAGNOSIS — N184 Chronic kidney disease, stage 4 (severe): Secondary | ICD-10-CM | POA: Diagnosis not present

## 2017-07-15 DIAGNOSIS — R600 Localized edema: Secondary | ICD-10-CM | POA: Diagnosis not present

## 2017-07-15 DIAGNOSIS — I10 Essential (primary) hypertension: Secondary | ICD-10-CM | POA: Diagnosis not present

## 2017-07-19 DIAGNOSIS — G4733 Obstructive sleep apnea (adult) (pediatric): Secondary | ICD-10-CM | POA: Diagnosis not present

## 2017-07-25 DIAGNOSIS — G4733 Obstructive sleep apnea (adult) (pediatric): Secondary | ICD-10-CM | POA: Diagnosis not present

## 2017-08-02 ENCOUNTER — Telehealth: Payer: Self-pay

## 2017-08-02 NOTE — Telephone Encounter (Signed)
Spoke to pt. Advised him they should still be open. May just be having trouble with their phones. He will check back with them later.

## 2017-08-02 NOTE — Telephone Encounter (Signed)
Copied from [place] [id number]. Topic: General - Other >> Aug 02, 2017  9:08 AM Valla Leaver wrote: Reason for CRM: The phone line for [facility] Dr. Candiss Norse is not working and the patient wants to know if Dr, Silvio Pate can find out if t hey are still open. If  not he needs Dr. Silvio Pate to recommend a new nephrologist.

## 2017-08-06 DIAGNOSIS — I10 Essential (primary) hypertension: Secondary | ICD-10-CM | POA: Diagnosis not present

## 2017-08-06 DIAGNOSIS — E1129 Type 2 diabetes mellitus with other diabetic kidney complication: Secondary | ICD-10-CM | POA: Diagnosis not present

## 2017-08-06 DIAGNOSIS — N184 Chronic kidney disease, stage 4 (severe): Secondary | ICD-10-CM | POA: Diagnosis not present

## 2017-08-08 ENCOUNTER — Other Ambulatory Visit: Payer: Self-pay | Admitting: Internal Medicine

## 2017-08-13 ENCOUNTER — Encounter: Payer: Self-pay | Admitting: Internal Medicine

## 2017-08-13 ENCOUNTER — Ambulatory Visit (INDEPENDENT_AMBULATORY_CARE_PROVIDER_SITE_OTHER): Payer: 59 | Admitting: Internal Medicine

## 2017-08-13 ENCOUNTER — Telehealth: Payer: Self-pay | Admitting: Internal Medicine

## 2017-08-13 VITALS — BP 122/84 | HR 55 | Temp 98.1°F | Ht 71.5 in | Wt 256.0 lb

## 2017-08-13 DIAGNOSIS — E1122 Type 2 diabetes mellitus with diabetic chronic kidney disease: Secondary | ICD-10-CM | POA: Diagnosis not present

## 2017-08-13 DIAGNOSIS — Z794 Long term (current) use of insulin: Secondary | ICD-10-CM

## 2017-08-13 DIAGNOSIS — Z0000 Encounter for general adult medical examination without abnormal findings: Secondary | ICD-10-CM

## 2017-08-13 DIAGNOSIS — E1165 Type 2 diabetes mellitus with hyperglycemia: Secondary | ICD-10-CM

## 2017-08-13 DIAGNOSIS — IMO0002 Reserved for concepts with insufficient information to code with codable children: Secondary | ICD-10-CM

## 2017-08-13 DIAGNOSIS — G4733 Obstructive sleep apnea (adult) (pediatric): Secondary | ICD-10-CM

## 2017-08-13 DIAGNOSIS — E1142 Type 2 diabetes mellitus with diabetic polyneuropathy: Secondary | ICD-10-CM | POA: Diagnosis not present

## 2017-08-13 DIAGNOSIS — N183 Chronic kidney disease, stage 3 (moderate): Secondary | ICD-10-CM

## 2017-08-13 DIAGNOSIS — N184 Chronic kidney disease, stage 4 (severe): Secondary | ICD-10-CM

## 2017-08-13 NOTE — Telephone Encounter (Signed)
Dr Silvio Pate is out of the office for the next week. I am not sure another provider will want to do this order. I will check and see. The Sleep Study he had done should be scanned in within the next few days.

## 2017-08-13 NOTE — Assessment & Plan Note (Signed)
Uses it every night but his machine is not working well Will plan to order new one--he will check with insurance autotitrate from 6-15 would be my plan

## 2017-08-13 NOTE — Telephone Encounter (Signed)
Copied from [place] [id number]. Topic: Inquiry >> Aug 13, 2017  2:26 PM Pricilla Handler wrote: Reason for CRM: Patient called requesting to speak with Dr. Alla German assistant. Patient states that he is now ready to have his CPAP ordered. Patient needs a Prescription/Order faxed to [facility] (PHN # [phone], FAX # [phone]). Please call patient once order is completed.       Thank You!!!
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
## 2017-08-13 NOTE — Assessment & Plan Note (Signed)
Having a harder time standing for his job, etc Hopefully he can get a less strenuous position for the last 2 years he intends to work

## 2017-08-13 NOTE — Assessment & Plan Note (Signed)
Doing better with lifestyle Colon due 2021 Defer PSA to next year at least Yearly flu vaccine

## 2017-08-13 NOTE — Assessment & Plan Note (Signed)
Control is now better Keeps up with nephrologist as well--- this was recently worse

## 2017-08-13 NOTE — Assessment & Plan Note (Signed)
Hopefully some of the recent worsening was prerenal--he has increased his fluid intake

## 2017-08-16 NOTE — Telephone Encounter (Signed)
Still waiting for sleep study to review, then will order CPAP

## 2017-08-16 NOTE — Telephone Encounter (Signed)
Spoke to pt and advised per [facility]. Information can be sent directly to [facility] when complete

## 2017-08-17 ENCOUNTER — Encounter: Payer: Self-pay | Admitting: Emergency Medicine

## 2017-08-17 ENCOUNTER — Ambulatory Visit (INDEPENDENT_AMBULATORY_CARE_PROVIDER_SITE_OTHER): Payer: 59

## 2017-08-17 ENCOUNTER — Ambulatory Visit: Payer: 59 | Admitting: Family Medicine

## 2017-08-17 VITALS — BP 142/70 | HR 81 | Resp 16 | Wt 255.0 lb

## 2017-08-17 DIAGNOSIS — M25561 Pain in right knee: Secondary | ICD-10-CM | POA: Insufficient documentation

## 2017-08-17 NOTE — Assessment & Plan Note (Signed)
Likely component of degenerative meniscal tear with mild arthritic change. No inciting event. Has stage IV kidney disease so avoiding NSAIDS  - counseled on HEP  - counseled on ice and compression  - could try Pennsaid  - if no improvement consider injections, PT and imaging.

## 2017-08-17 NOTE — Patient Instructions (Signed)
Nice to meet you  Please work on the exercises  Please try the compression and ice  Follow up with me in 3-4 weeks if your pain hasn't improved.
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
## 2017-08-19 NOTE — Telephone Encounter (Signed)
I do not see the info scanned in, yet. I did not see it on Dr Alla German desk, either.

## 2017-08-23 ENCOUNTER — Other Ambulatory Visit: Payer: Self-pay | Admitting: Internal Medicine

## 2017-08-24 NOTE — Telephone Encounter (Signed)
Dr Silvio Pate, we copied the report he brought, correct?

## 2017-08-24 NOTE — Telephone Encounter (Signed)
Honestly I don't remember if I saw report--but it should have been scanned if I did. We only need report if the company is requiring--I am okay with signing order See if they will fax the appropriate order---I did discuss it at his last visit

## 2017-08-24 NOTE — Telephone Encounter (Signed)
Rx written and placed in Dr Alla German Inbox on his desk.

## 2017-08-24 NOTE — Telephone Encounter (Addendum)
Spoke to Samuel Reed. They are faxing a form. Said it requires Equipment, Settings, Dx, length of need, signed, dated, NPI. Sleep Study report, Face to Face Note, insurance card.   Spoke to pt. He said his sleep study is over 56 years old. He has current CPAP Machine. Pt said his pressure is 11. He is just needing a new machine. Hopefully, he will not require a new study.

## 2017-08-25 NOTE — Telephone Encounter (Signed)
All information faxed to [facility].

## 2017-08-25 NOTE — Telephone Encounter (Signed)
Rx signed.

## 2017-08-30 ENCOUNTER — Other Ambulatory Visit: Payer: Self-pay | Admitting: Internal Medicine

## 2017-09-02 ENCOUNTER — Telehealth: Payer: Self-pay | Admitting: Internal Medicine

## 2017-09-02 NOTE — Telephone Encounter (Signed)
Copied from [place] [id number]. Topic: General - Other >> Sep 02, 2017  4:35 PM Cecelia Byars, NT wrote: Reason for CRM: Patient called and said the company that the c pap is coming from needs a copy of his sleep study,  please call  [phone]

## 2017-09-03 DIAGNOSIS — E1129 Type 2 diabetes mellitus with other diabetic kidney complication: Secondary | ICD-10-CM | POA: Diagnosis not present

## 2017-09-03 DIAGNOSIS — N184 Chronic kidney disease, stage 4 (severe): Secondary | ICD-10-CM | POA: Diagnosis not present

## 2017-09-03 DIAGNOSIS — I10 Essential (primary) hypertension: Secondary | ICD-10-CM | POA: Diagnosis not present

## 2017-09-06 NOTE — Telephone Encounter (Signed)
We do not have a sleep study report. I called [employer] and they cannot move forward. Called pt to have him call me back.

## 2017-09-14 ENCOUNTER — Other Ambulatory Visit: Payer: Self-pay | Admitting: Internal Medicine

## 2017-09-14 NOTE — Telephone Encounter (Signed)
Left message on home VM for pt to call. We cannot find a copy of his sleep study and [facility] says they cannot do anything without it. They will not accept the pressures on his current CPAP machine.

## 2017-09-19 DIAGNOSIS — Z23 Encounter for immunization: Secondary | ICD-10-CM | POA: Diagnosis not present

## 2017-09-21 ENCOUNTER — Other Ambulatory Visit: Payer: Self-pay | Admitting: Internal Medicine

## 2017-10-04 ENCOUNTER — Other Ambulatory Visit: Payer: Self-pay | Admitting: Internal Medicine

## 2017-10-05 ENCOUNTER — Ambulatory Visit: Payer: Self-pay | Admitting: Internal Medicine

## 2017-10-18 DIAGNOSIS — G4733 Obstructive sleep apnea (adult) (pediatric): Secondary | ICD-10-CM | POA: Diagnosis not present

## 2017-10-25 ENCOUNTER — Encounter: Payer: Self-pay | Admitting: Family Medicine

## 2017-10-25 ENCOUNTER — Ambulatory Visit: Payer: 59 | Admitting: Family Medicine

## 2017-10-25 VITALS — BP 124/60 | HR 63 | Temp 98.5°F | Ht 71.5 in | Wt 267.8 lb

## 2017-10-25 DIAGNOSIS — J22 Unspecified acute lower respiratory infection: Secondary | ICD-10-CM | POA: Diagnosis not present

## 2017-10-25 MED ORDER — DOXYCYCLINE HYCLATE 100 MG PO CAPS: 100.0000 mg | ORAL_CAPSULE | Freq: Two times a day (BID) | ORAL | 0 refills | Status: DC

## 2017-10-25 NOTE — Patient Instructions (Signed)
Good to see you today, I have sent in an antibiotic to your pharmacy.  Take with food and a full glass of water.   For nasal congestion, can take cetirizine 5 mg (kid's dose) or diphenhydramine (may make you sleepy) 25 mg at bedtime  For cough, can take over the counter Delsym or Robitussin DM- generic is fine  If not better in 4-5 days or if worse, please follow up
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
## 2017-11-03 ENCOUNTER — Other Ambulatory Visit: Payer: Self-pay | Admitting: Internal Medicine

## 2017-11-03 NOTE — Telephone Encounter (Signed)
Please advise 

## 2017-11-03 NOTE — Telephone Encounter (Signed)
OK to refill

## 2017-11-17 DIAGNOSIS — G4733 Obstructive sleep apnea (adult) (pediatric): Secondary | ICD-10-CM | POA: Diagnosis not present

## 2017-11-19 ENCOUNTER — Encounter: Payer: Self-pay | Admitting: Internal Medicine

## 2017-11-19 DIAGNOSIS — I129 Hypertensive chronic kidney disease with stage 1 through stage 4 chronic kidney disease, or unspecified chronic kidney disease: Secondary | ICD-10-CM | POA: Diagnosis not present

## 2017-11-19 DIAGNOSIS — N184 Chronic kidney disease, stage 4 (severe): Secondary | ICD-10-CM | POA: Diagnosis not present

## 2017-11-19 DIAGNOSIS — E1122 Type 2 diabetes mellitus with diabetic chronic kidney disease: Secondary | ICD-10-CM | POA: Diagnosis not present

## 2017-12-16 ENCOUNTER — Other Ambulatory Visit: Payer: Self-pay | Admitting: Internal Medicine

## 2017-12-18 DIAGNOSIS — G4733 Obstructive sleep apnea (adult) (pediatric): Secondary | ICD-10-CM | POA: Diagnosis not present

## 2017-12-26 ENCOUNTER — Other Ambulatory Visit: Payer: Self-pay | Admitting: Internal Medicine

## 2017-12-27 ENCOUNTER — Other Ambulatory Visit: Payer: Self-pay | Admitting: Internal Medicine

## 2018-01-17 DIAGNOSIS — G4733 Obstructive sleep apnea (adult) (pediatric): Secondary | ICD-10-CM | POA: Diagnosis not present

## 2018-01-27 DIAGNOSIS — G4733 Obstructive sleep apnea (adult) (pediatric): Secondary | ICD-10-CM | POA: Diagnosis not present

## 2018-01-31 ENCOUNTER — Ambulatory Visit: Payer: 59 | Admitting: Podiatry

## 2018-01-31 ENCOUNTER — Ambulatory Visit: Payer: 59

## 2018-01-31 ENCOUNTER — Encounter: Payer: Self-pay | Admitting: Podiatry

## 2018-01-31 DIAGNOSIS — M79675 Pain in left toe(s): Secondary | ICD-10-CM | POA: Diagnosis not present

## 2018-01-31 DIAGNOSIS — J22 Unspecified acute lower respiratory infection: Secondary | ICD-10-CM

## 2018-01-31 DIAGNOSIS — L603 Nail dystrophy: Secondary | ICD-10-CM

## 2018-01-31 MED ORDER — GENTAMICIN SULFATE 0.1 % EX CREA: 1.0000 "application " | TOPICAL_CREAM | Freq: Two times a day (BID) | CUTANEOUS | 0 refills | Status: DC

## 2018-01-31 MED ORDER — DOXYCYCLINE HYCLATE 100 MG PO CAPS: 100.0000 mg | ORAL_CAPSULE | Freq: Two times a day (BID) | ORAL | 0 refills | Status: DC

## 2018-01-31 NOTE — Patient Instructions (Signed)

## 2018-02-03 NOTE — Progress Notes (Signed)
   Subjective: 58 year old male presenting today with a chief complaint of a partially detached left great toenail that occurred two days ago. He states he dropped something on the toe about two years ago and the nail became detached then. He states the nail split and became partially detached two days ago. He denies any new trauma or injury. He has not done anything for treatment. There are no modifying factors noted. Patient is here for further evaluation and treatment.   Past Medical History:  Diagnosis Date  . Chronic venous insufficiency   . CKD (chronic kidney disease), stage III ([place])   . Diabetes mellitus type II 2003  . HLD (hyperlipidemia)   . HTN (hypertension) 1980's  . OSA (obstructive sleep apnea)   . Sleep apnea     Objective:  General: Well developed, nourished, in no acute distress, alert and oriented x3   Dermatology: Partially detached nail of the left hallux. Skin is dry and supple bilateral. Negative open lesions or macerations. No signs of infection.   Vascular: Dorsalis Pedis artery and Posterior Tibial artery pedal pulses palpable. No lower extremity edema noted.   Neruologic: Grossly intact via light touch bilateral.  Musculoskeletal: Muscular strength within normal limits in all groups bilateral. Normal range of motion noted to all pedal and ankle joints.   Assesement: #1 Painful, dystrophic, partially detached nail left hallux  Plan of Care:  1. Patient evaluated.  2. Discussed treatment alternatives and plan of care. Explained nail avulsion procedure and post procedure course to patient. 3. Patient opted for permanent total nail avulsion of the left great toenail.  4. Prior to procedure, local anesthesia infiltration utilized using 3 ml of a 50:50 mixture of 2% plain lidocaine and 0.5% plain marcaine in a normal hallux block fashion and a betadine prep performed.  5. Total permanent nail avulsion with chemical matrixectomy performed using [id number]  applications of phenol followed by alcohol flush.  6. Light dressing applied. 7. Prescription for Doxycycline #20 provided to patient.  8. Prescription for Gentamicin cream provided to patient to use daily with a bandage.  9. Return to clinic in 3 weeks.   Edrick Kins, DPM [facility]  Dr. Edrick Kins, [address]                                        [place], [place] [address]                Office [phone]  Fax [phone]

## 2018-02-04 ENCOUNTER — Ambulatory Visit: Payer: 59 | Admitting: Internal Medicine

## 2018-02-04 ENCOUNTER — Encounter: Payer: Self-pay | Admitting: Internal Medicine

## 2018-02-04 VITALS — BP 138/70 | HR 62 | Ht 71.5 in | Wt 272.0 lb

## 2018-02-04 DIAGNOSIS — N183 Chronic kidney disease, stage 3 (moderate): Secondary | ICD-10-CM

## 2018-02-04 DIAGNOSIS — E1165 Type 2 diabetes mellitus with hyperglycemia: Secondary | ICD-10-CM

## 2018-02-04 DIAGNOSIS — H5203 Hypermetropia, bilateral: Secondary | ICD-10-CM | POA: Diagnosis not present

## 2018-02-04 DIAGNOSIS — E785 Hyperlipidemia, unspecified: Secondary | ICD-10-CM | POA: Diagnosis not present

## 2018-02-04 DIAGNOSIS — E669 Obesity, unspecified: Secondary | ICD-10-CM | POA: Diagnosis not present

## 2018-02-04 DIAGNOSIS — IMO0002 Reserved for concepts with insufficient information to code with codable children: Secondary | ICD-10-CM

## 2018-02-04 DIAGNOSIS — Z794 Long term (current) use of insulin: Secondary | ICD-10-CM | POA: Diagnosis not present

## 2018-02-04 DIAGNOSIS — E1122 Type 2 diabetes mellitus with diabetic chronic kidney disease: Secondary | ICD-10-CM | POA: Diagnosis not present

## 2018-02-04 DIAGNOSIS — H524 Presbyopia: Secondary | ICD-10-CM | POA: Diagnosis not present

## 2018-02-04 DIAGNOSIS — E113393 Type 2 diabetes mellitus with moderate nonproliferative diabetic retinopathy without macular edema, bilateral: Secondary | ICD-10-CM | POA: Diagnosis not present

## 2018-02-04 DIAGNOSIS — H52223 Regular astigmatism, bilateral: Secondary | ICD-10-CM | POA: Diagnosis not present

## 2018-02-04 LAB — POCT GLYCOSYLATED HEMOGLOBIN (HGB A1C): HEMOGLOBIN A1C: 7 % — AB (ref 4.0–5.6)

## 2018-02-04 LAB — LIPID PANEL
CHOL/HDL RATIO: 2
Cholesterol: 81 mg/dL (ref 0–200)
HDL: 33.6 mg/dL — AB (ref >39.00)
LDL Cholesterol: 34 mg/dL (ref 0–99)
NONHDL: 47.78
Triglycerides: 70 mg/dL (ref 0.0–149.0)
VLDL: 14 mg/dL (ref 0.0–40.0)

## 2018-02-04 NOTE — Patient Instructions (Addendum)
Please continue: - Basaglar 55 units at bedtime - Mealtime insulin 10-20 units before meals.  Please return in 3-4 months with your sugar log.
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
## 2018-02-09 ENCOUNTER — Ambulatory Visit (HOSPITAL_COMMUNITY)
Admission: EM | Admit: 2018-02-09 | Discharge: 2018-02-09 | Disposition: A | Discharge status: Home / Self Care | Payer: 59 | Attending: Family Medicine | Admitting: Family Medicine

## 2018-02-09 ENCOUNTER — Encounter (HOSPITAL_COMMUNITY): Payer: Self-pay

## 2018-02-09 DIAGNOSIS — R21 Rash and other nonspecific skin eruption: Secondary | ICD-10-CM | POA: Diagnosis not present
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
## 2018-02-09 NOTE — ED Triage Notes (Signed)
Pt presents with rash spot on left forearm.

## 2018-02-09 NOTE — Discharge Instructions (Addendum)
This rash appears more vascular/bruising Please monitor for self resolution If this spreads to other parts of your body, you are bleeding easily- bleeding with brushing teeth, please go to emergency room

## 2018-02-17 DIAGNOSIS — G4733 Obstructive sleep apnea (adult) (pediatric): Secondary | ICD-10-CM | POA: Diagnosis not present

## 2018-02-21 ENCOUNTER — Ambulatory Visit: Payer: 59

## 2018-02-21 ENCOUNTER — Ambulatory Visit: Payer: 59 | Admitting: Podiatry

## 2018-02-21 ENCOUNTER — Encounter: Payer: Self-pay | Admitting: Podiatry

## 2018-02-21 DIAGNOSIS — M79675 Pain in left toe(s): Secondary | ICD-10-CM

## 2018-02-21 DIAGNOSIS — L603 Nail dystrophy: Secondary | ICD-10-CM | POA: Diagnosis not present

## 2018-02-21 MED ORDER — GENTAMICIN SULFATE 0.1 % EX CREA: 1.0000 "application " | TOPICAL_CREAM | Freq: Two times a day (BID) | CUTANEOUS | 0 refills | Status: DC

## 2018-02-24 ENCOUNTER — Telehealth: Payer: Self-pay | Admitting: Internal Medicine

## 2018-02-24 MED ORDER — INSULIN LISPRO (1 UNIT DIAL) 100 UNIT/ML (KWIKPEN): PEN_INJECTOR | SUBCUTANEOUS | 4 refills | Status: DC

## 2018-02-24 NOTE — Telephone Encounter (Signed)
MEDICATION: insulin lispro (HUMALOG KWIKPEN) 100 UNIT/ML KiwkPen  PHARMACY:  [id number] - [place], [place] - [address]  IS THIS A 90 DAY SUPPLY : yes  IS PATIENT OUT OF MEDICATION: yes  IF NOT; HOW MUCH IS LEFT:   LAST APPOINTMENT DATE: @1 /10/2018  NEXT APPOINTMENT DATE:@5 /08/2018  DO WE HAVE YOUR PERMISSION TO LEAVE A DETAILED MESSAGE:  OTHER COMMENTS:  Patient stated he really needs this sent today since he is completely out of his insulin.     **Let patient know to contact pharmacy at the end of the day to make sure medication is ready. **  ** Please notify patient to allow 48-72 hours to process**  **Encourage patient to contact the pharmacy for refills or they can request refills through [facility]**

## 2018-02-24 NOTE — Telephone Encounter (Signed)
RX sent

## 2018-02-27 NOTE — Progress Notes (Signed)
   Subjective: Patient presents today 2 weeks post total permanent nail avulsion procedure of the left hallux. He states he is doing well. He reports minimal drainage and tenderness. There are no modifying factors noted. He has been using Gentamicin cream as directed. Patient is here for further evaluation and treatment.   Past Medical History:  Diagnosis Date  . Chronic venous insufficiency   . CKD (chronic kidney disease), stage III ([place])   . Diabetes mellitus type II 2003  . HLD (hyperlipidemia)   . HTN (hypertension) 1980's  . OSA (obstructive sleep apnea)   . Sleep apnea     Objective: Skin is warm, dry and supple. Nail and respective nail fold appears to be healing appropriately. Open wound to the associated nail fold with a granular wound base and moderate amount of fibrotic tissue. Minimal drainage noted. Mild erythema around the periungual region likely due to phenol chemical matricectomy.  Assessment: #1 postop permanent total nail avulsion left hallux   Plan of care: #1 patient was evaluated  #2 debridement of open wound was performed to the periungual border of the respective toe using a currette. Antibiotic ointment and Band-Aid was applied. #3 Refill prescription for Gentamicin cream provided to patient to use daily with a bandage.  #4 patient is to return to clinic on a PRN basis.   Edrick Kins, DPM [facility]  Dr. Edrick Kins, [address]                                        [place], [place] [address]                Office [phone]  Fax [phone]

## 2018-03-08 DIAGNOSIS — E119 Type 2 diabetes mellitus without complications: Secondary | ICD-10-CM | POA: Diagnosis not present

## 2018-03-08 DIAGNOSIS — N184 Chronic kidney disease, stage 4 (severe): Secondary | ICD-10-CM | POA: Diagnosis not present

## 2018-03-08 DIAGNOSIS — I10 Essential (primary) hypertension: Secondary | ICD-10-CM | POA: Diagnosis not present

## 2018-03-18 DIAGNOSIS — N184 Chronic kidney disease, stage 4 (severe): Secondary | ICD-10-CM | POA: Diagnosis not present

## 2018-03-20 DIAGNOSIS — G4733 Obstructive sleep apnea (adult) (pediatric): Secondary | ICD-10-CM | POA: Diagnosis not present

## 2018-03-22 DIAGNOSIS — I10 Essential (primary) hypertension: Secondary | ICD-10-CM | POA: Diagnosis not present

## 2018-03-28 ENCOUNTER — Telehealth: Payer: Self-pay | Admitting: Internal Medicine

## 2018-03-28 ENCOUNTER — Other Ambulatory Visit: Payer: Self-pay | Admitting: Internal Medicine

## 2018-03-28 MED ORDER — BASAGLAR KWIKPEN 100 UNIT/ML ~~LOC~~ SOPN: PEN_INJECTOR | SUBCUTANEOUS | 0 refills | Status: DC

## 2018-03-28 NOTE — Telephone Encounter (Signed)
MEDICATION: Insulin Glargine (BASAGLAR KWIKPEN) 100 UNIT/ML SOPN   PHARMACY:  [id number] - [place], [place] - [address]  IS THIS A 90 DAY SUPPLY : yes  IS PATIENT OUT OF MEDICATION: yes  IF NOT; HOW MUCH IS LEFT:   LAST APPOINTMENT DATE: @3 /02/2018  NEXT APPOINTMENT DATE:@5 /08/2018  DO WE HAVE YOUR PERMISSION TO LEAVE A DETAILED MESSAGE:  OTHER COMMENTS:  Patient has been without this medication for 2 days  And would like if possible for this to be done today    **Let patient know to contact pharmacy at the end of the day to make sure medication is ready. **  ** Please notify patient to allow 48-72 hours to process**  **Encourage patient to contact the pharmacy for refills or they can request refills through [facility]**

## 2018-03-28 NOTE — Telephone Encounter (Signed)
RX sent

## 2018-04-18 DIAGNOSIS — G4733 Obstructive sleep apnea (adult) (pediatric): Secondary | ICD-10-CM | POA: Diagnosis not present

## 2018-04-22 DIAGNOSIS — N184 Chronic kidney disease, stage 4 (severe): Secondary | ICD-10-CM | POA: Diagnosis not present

## 2018-04-29 DIAGNOSIS — I10 Essential (primary) hypertension: Secondary | ICD-10-CM | POA: Diagnosis not present

## 2018-04-29 DIAGNOSIS — N184 Chronic kidney disease, stage 4 (severe): Secondary | ICD-10-CM | POA: Diagnosis not present

## 2018-04-29 DIAGNOSIS — N2581 Secondary hyperparathyroidism of renal origin: Secondary | ICD-10-CM | POA: Diagnosis not present

## 2018-05-03 ENCOUNTER — Encounter: Payer: Self-pay | Admitting: Internal Medicine

## 2018-05-03 NOTE — Progress Notes (Signed)
Received labs from nephrology (Dr. Carlus Pavlov), drawn 04/22/2018: -BMP normal with the exception of BUN/creatinine 67/3.46, EGFR 19, glucose 137, phosphorus 4.7 (2.8-4.1), sodium 145 (134-144) -intact PTH 179 -Protein to creatinine ratio 1291 mg/g (0-200)

## 2018-05-19 DIAGNOSIS — G4733 Obstructive sleep apnea (adult) (pediatric): Secondary | ICD-10-CM | POA: Diagnosis not present

## 2018-05-30 DIAGNOSIS — N184 Chronic kidney disease, stage 4 (severe): Secondary | ICD-10-CM | POA: Diagnosis not present

## 2018-06-03 ENCOUNTER — Encounter: Payer: Self-pay | Admitting: Internal Medicine

## 2018-06-03 ENCOUNTER — Ambulatory Visit (INDEPENDENT_AMBULATORY_CARE_PROVIDER_SITE_OTHER): Payer: 59 | Admitting: Internal Medicine

## 2018-06-03 DIAGNOSIS — E1122 Type 2 diabetes mellitus with diabetic chronic kidney disease: Secondary | ICD-10-CM

## 2018-06-03 DIAGNOSIS — IMO0002 Reserved for concepts with insufficient information to code with codable children: Secondary | ICD-10-CM

## 2018-06-03 DIAGNOSIS — E1165 Type 2 diabetes mellitus with hyperglycemia: Secondary | ICD-10-CM

## 2018-06-03 DIAGNOSIS — N183 Chronic kidney disease, stage 3 (moderate): Secondary | ICD-10-CM

## 2018-06-03 DIAGNOSIS — E669 Obesity, unspecified: Secondary | ICD-10-CM | POA: Diagnosis not present

## 2018-06-03 DIAGNOSIS — E785 Hyperlipidemia, unspecified: Secondary | ICD-10-CM | POA: Diagnosis not present

## 2018-06-03 DIAGNOSIS — Z794 Long term (current) use of insulin: Secondary | ICD-10-CM

## 2018-06-03 MED ORDER — LISINOPRIL 20 MG PO TABS: 20.0000 mg | ORAL_TABLET | Freq: Every day | ORAL | 3 refills | Status: DC
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
## 2018-06-03 NOTE — Patient Instructions (Addendum)
Please continue: - Basaglar 55 units at bedtime - Mealtime insulin 10-20 units before meals.  Please return in 4 months with your sugar log.

## 2018-06-12 ENCOUNTER — Other Ambulatory Visit: Payer: Self-pay | Admitting: Internal Medicine

## 2018-06-18 DIAGNOSIS — G4733 Obstructive sleep apnea (adult) (pediatric): Secondary | ICD-10-CM | POA: Diagnosis not present

## 2018-06-25 ENCOUNTER — Other Ambulatory Visit: Payer: Self-pay | Admitting: Internal Medicine

## 2018-07-25 ENCOUNTER — Other Ambulatory Visit: Payer: Self-pay | Admitting: Internal Medicine

## 2018-07-27 ENCOUNTER — Other Ambulatory Visit: Payer: Self-pay | Admitting: Internal Medicine

## 2018-08-07 IMAGING — CR DG FOOT COMPLETE 3+V*R*
3 series · 3 of 3 positions shown · non-contrast
Comparison: None.

CLINICAL DATA: Possible metallic foreign body

EXAM:
RIGHT FOOT COMPLETE - 3+ VIEW

[foot ap]
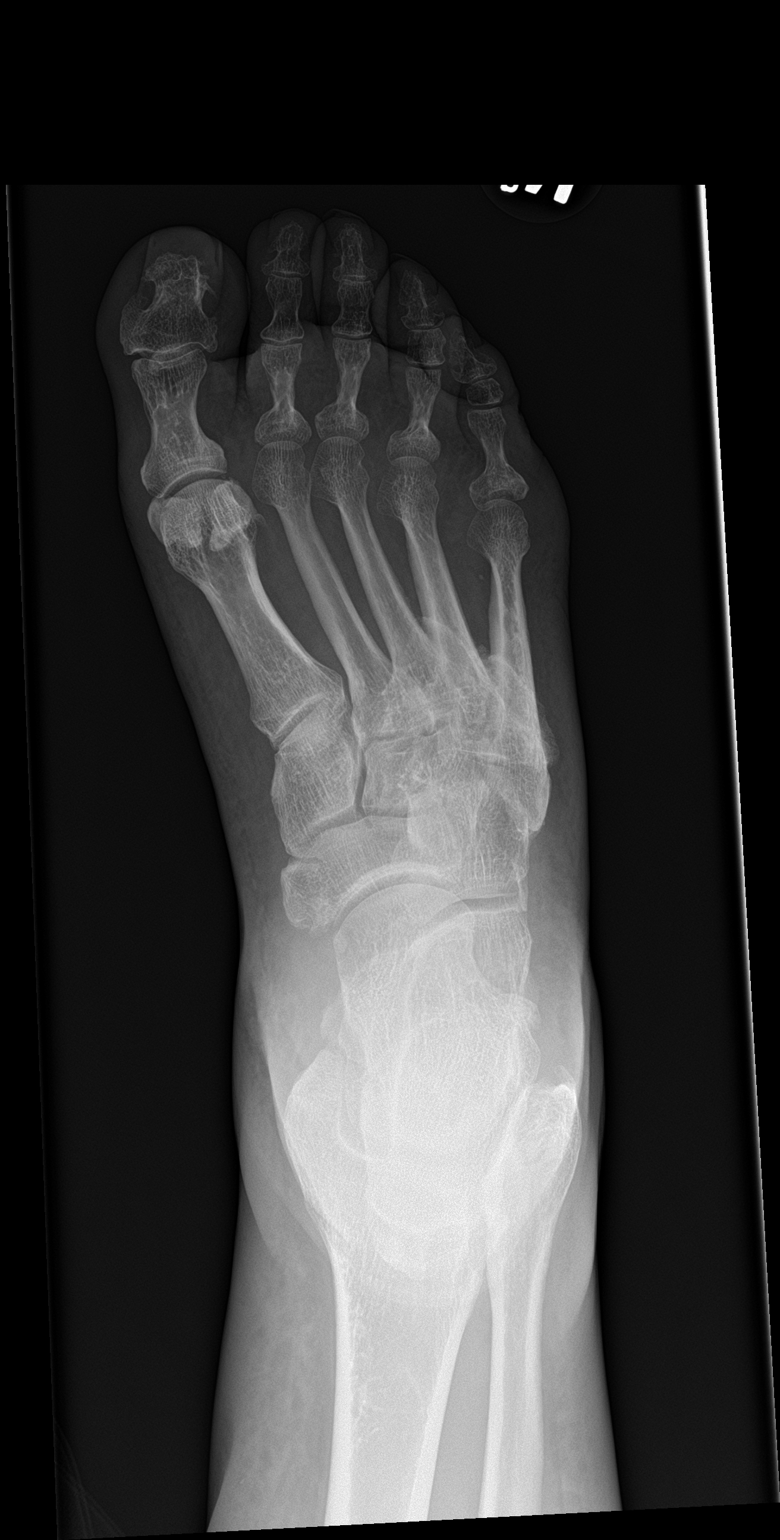

[foot obl]
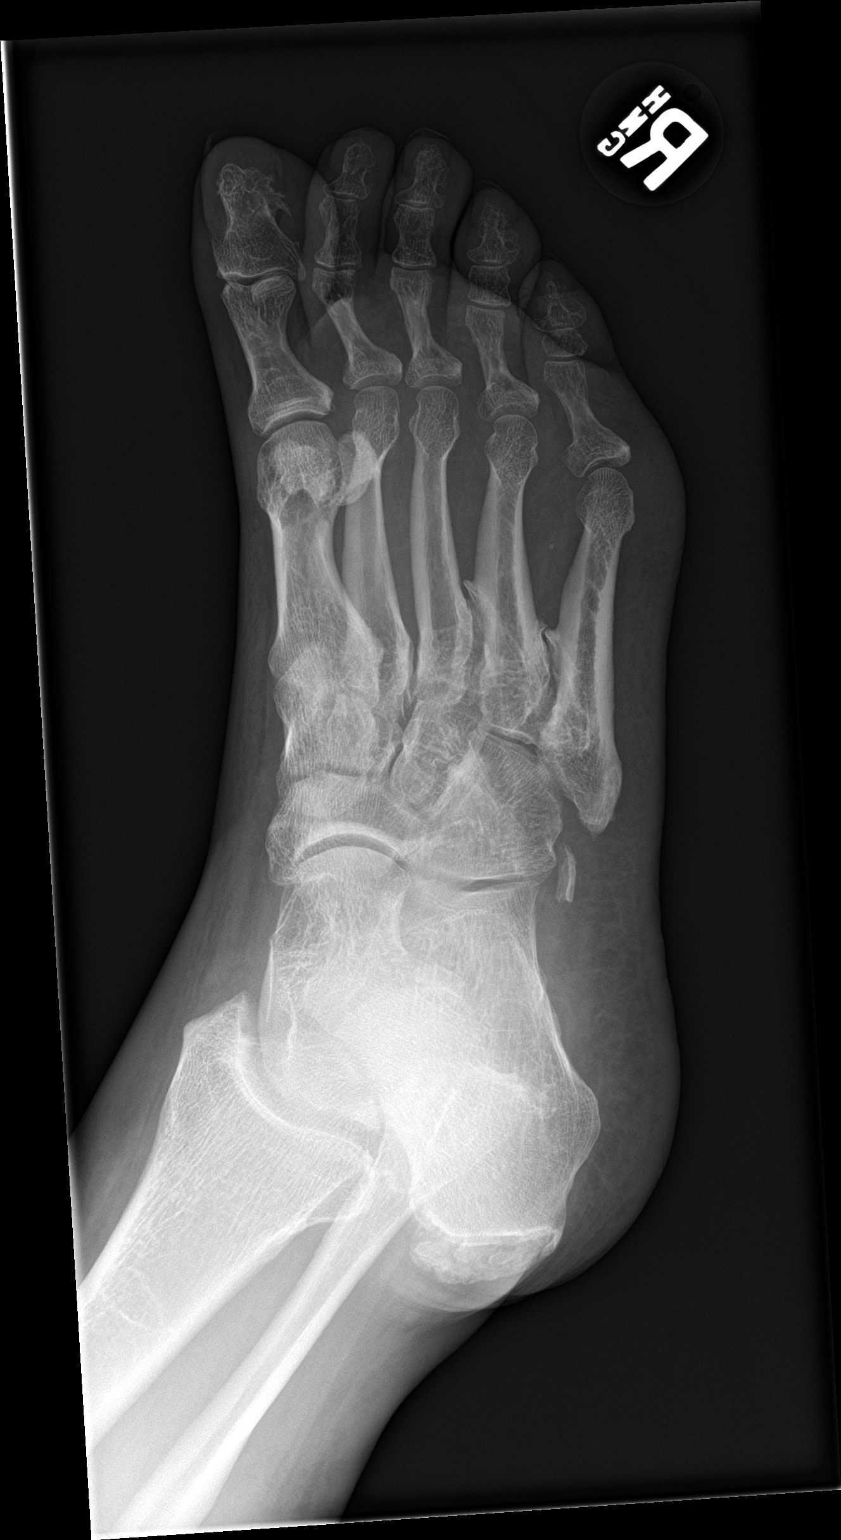

[foot lat]
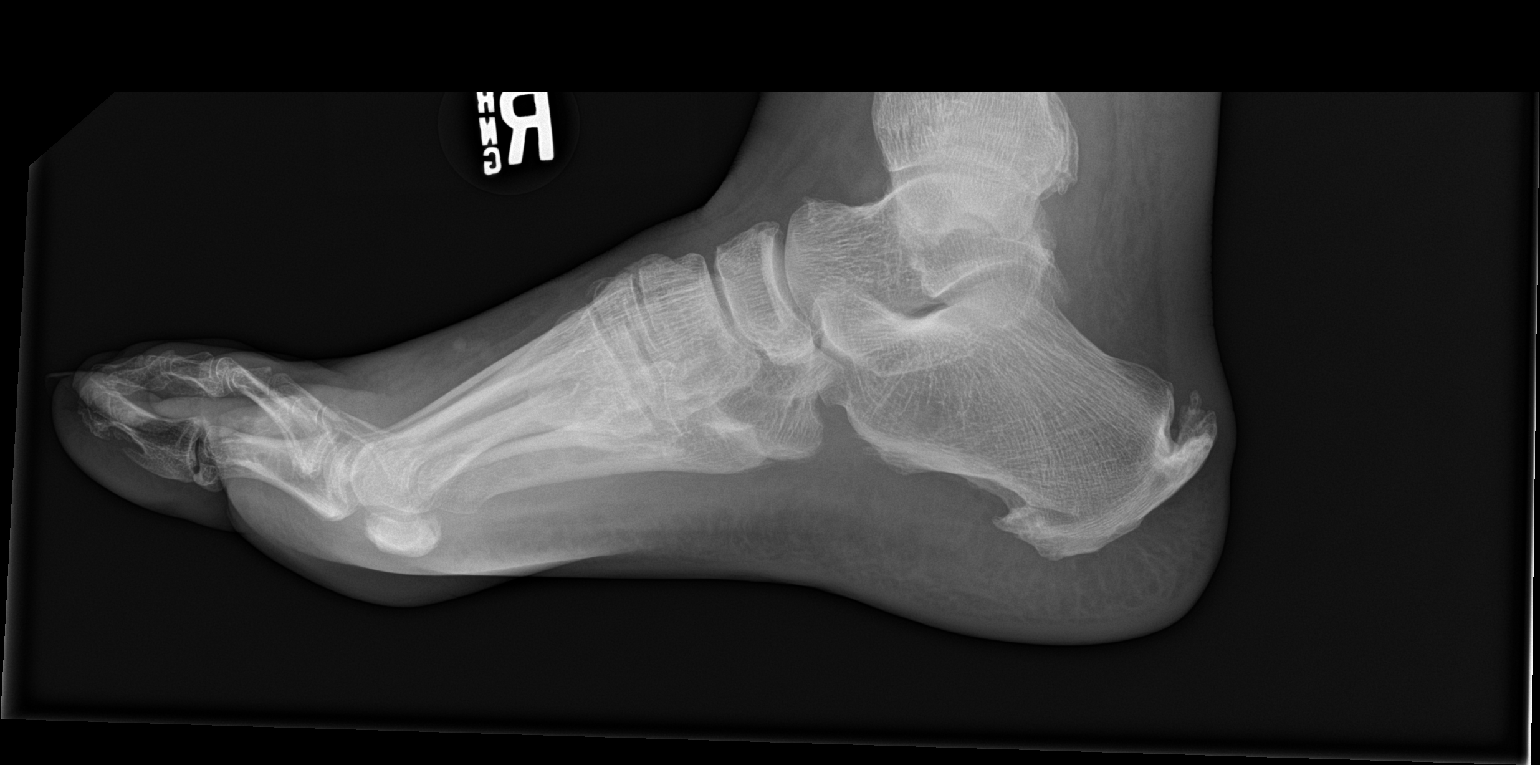

[3 of 3 positions shown; findings below may reference images not displayed]

FINDINGS: No acute bony abnormality. Specifically, no fracture, subluxation,
or dislocation. Soft tissues are intact. No metallic foreign body.
Plantar and posterior calcaneal spurs.
IMPRESSION: No visible metallic foreign body.  No acute bony abnormality.

## 2018-08-19 ENCOUNTER — Encounter: Payer: Self-pay | Admitting: Internal Medicine

## 2018-08-19 ENCOUNTER — Ambulatory Visit (INDEPENDENT_AMBULATORY_CARE_PROVIDER_SITE_OTHER): Payer: 59 | Admitting: Internal Medicine

## 2018-08-19 VITALS — BP 125/84 | Temp 98.6°F | Ht 72.0 in | Wt 260.0 lb

## 2018-08-19 DIAGNOSIS — Z794 Long term (current) use of insulin: Secondary | ICD-10-CM

## 2018-08-19 DIAGNOSIS — N184 Chronic kidney disease, stage 4 (severe): Secondary | ICD-10-CM | POA: Diagnosis not present

## 2018-08-19 DIAGNOSIS — Z0000 Encounter for general adult medical examination without abnormal findings: Secondary | ICD-10-CM | POA: Diagnosis not present

## 2018-08-19 DIAGNOSIS — Z125 Encounter for screening for malignant neoplasm of prostate: Secondary | ICD-10-CM

## 2018-08-19 DIAGNOSIS — E1121 Type 2 diabetes mellitus with diabetic nephropathy: Secondary | ICD-10-CM | POA: Diagnosis not present

## 2018-08-19 DIAGNOSIS — I10 Essential (primary) hypertension: Secondary | ICD-10-CM | POA: Diagnosis not present

## 2018-08-19 DIAGNOSIS — N2581 Secondary hyperparathyroidism of renal origin: Secondary | ICD-10-CM

## 2018-08-19 NOTE — Assessment & Plan Note (Signed)
Dr [name] is looking into transplant program for him (presumably when he worsens further) Will check labs and forward to him

## 2018-08-19 NOTE — Assessment & Plan Note (Signed)
BP Readings from Last 3 Encounters:  08/19/18 125/84  02/09/18 (!) 157/72  02/04/18 138/70   Seems to still be fine

## 2018-08-19 NOTE — Assessment & Plan Note (Signed)
Holding up with COVID Discussed home exercise program Flu vaccine in the fall Colon due next year Will check PSA after discussion
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
## 2018-08-19 NOTE — Assessment & Plan Note (Signed)
Seems to still have good control Will check A1c  Also with neuropathy--but no sig pain (so no Rx needed)

## 2018-08-23 ENCOUNTER — Other Ambulatory Visit: Payer: 59

## 2018-08-23 ENCOUNTER — Other Ambulatory Visit: Payer: Self-pay

## 2018-08-26 ENCOUNTER — Other Ambulatory Visit (INDEPENDENT_AMBULATORY_CARE_PROVIDER_SITE_OTHER): Payer: 59

## 2018-08-26 ENCOUNTER — Other Ambulatory Visit: Payer: Self-pay

## 2018-08-26 DIAGNOSIS — E1121 Type 2 diabetes mellitus with diabetic nephropathy: Secondary | ICD-10-CM | POA: Diagnosis not present

## 2018-08-26 DIAGNOSIS — Z125 Encounter for screening for malignant neoplasm of prostate: Secondary | ICD-10-CM | POA: Diagnosis not present

## 2018-08-26 DIAGNOSIS — Z794 Long term (current) use of insulin: Secondary | ICD-10-CM | POA: Diagnosis not present

## 2018-08-26 DIAGNOSIS — N184 Chronic kidney disease, stage 4 (severe): Secondary | ICD-10-CM | POA: Diagnosis not present

## 2018-08-26 LAB — PSA: PSA: 1.12 ng/mL (ref 0.10–4.00)

## 2018-08-26 LAB — RENAL FUNCTION PANEL
Albumin: 4.1 g/dL (ref 3.5–5.2)
BUN: 67 mg/dL — ABNORMAL HIGH (ref 6–23)
CO2: 25 mEq/L (ref 19–32)
Calcium: 8.8 mg/dL (ref 8.4–10.5)
Chloride: 110 mEq/L (ref 96–112)
Creatinine, Ser: 2.93 mg/dL — ABNORMAL HIGH (ref 0.40–1.50)
GFR: 22.27 mL/min — ABNORMAL LOW (ref >60.00)
Glucose, Bld: 87 mg/dL (ref 70–99)
Phosphorus: 5 mg/dL — ABNORMAL HIGH (ref 2.3–4.6)
Potassium: 3.7 mEq/L (ref 3.5–5.1)
Sodium: 145 mEq/L (ref 135–145)

## 2018-08-26 LAB — LIPID PANEL
Cholesterol: 62 mg/dL (ref 0–200)
HDL: 26.7 mg/dL — ABNORMAL LOW (ref >39.00)
LDL Cholesterol: 17 mg/dL (ref 0–99)
NonHDL: 35.2
Total CHOL/HDL Ratio: 2
Triglycerides: 93 mg/dL (ref 0.0–149.0)
VLDL: 18.6 mg/dL (ref 0.0–40.0)

## 2018-08-26 LAB — HEPATIC FUNCTION PANEL
ALT: 19 U/L (ref 0–53)
AST: 16 U/L (ref 0–37)
Albumin: 4.1 g/dL (ref 3.5–5.2)
Alkaline Phosphatase: 104 U/L (ref 39–117)
Bilirubin, Direct: 0.2 mg/dL (ref 0.0–0.3)
Total Bilirubin: 0.7 mg/dL (ref 0.2–1.2)
Total Protein: 6.7 g/dL (ref 6.0–8.3)

## 2018-08-26 LAB — VITAMIN D 25 HYDROXY (VIT D DEFICIENCY, FRACTURES): VITD: 22.44 ng/mL — ABNORMAL LOW (ref 30.00–100.00)

## 2018-08-26 LAB — HEMOGLOBIN A1C: Hgb A1c MFr Bld: 7.4 % — ABNORMAL HIGH (ref 4.6–6.5)

## 2018-08-27 ENCOUNTER — Other Ambulatory Visit: Payer: Self-pay | Admitting: Internal Medicine

## 2018-08-29 ENCOUNTER — Encounter: Payer: Self-pay | Admitting: Internal Medicine

## 2018-08-29 DIAGNOSIS — N2581 Secondary hyperparathyroidism of renal origin: Secondary | ICD-10-CM | POA: Insufficient documentation

## 2018-08-29 LAB — PARATHYROID HORMONE, INTACT (NO CA): PTH: 228 pg/mL — ABNORMAL HIGH (ref 14–64)

## 2018-08-29 NOTE — Assessment & Plan Note (Signed)
PTH elevated and vitamin D low---will send to his nephrologist for treatment plan

## 2018-09-06 ENCOUNTER — Other Ambulatory Visit: Payer: Self-pay | Admitting: Internal Medicine

## 2018-09-24 ENCOUNTER — Other Ambulatory Visit: Payer: Self-pay | Admitting: Internal Medicine

## 2018-10-27 ENCOUNTER — Other Ambulatory Visit: Payer: Self-pay | Admitting: Internal Medicine

## 2018-11-14 ENCOUNTER — Telehealth: Payer: Self-pay | Admitting: Internal Medicine

## 2018-11-14 NOTE — Telephone Encounter (Signed)
MEDICATION: Pen Needles for Basglar and Humalog Pens  PHARMACY:  [facility] [place] [facility] [address] : yes  IS PATIENT OUT OF MEDICATION: no  IF NOT; HOW MUCH IS LEFT: 3 total  LAST APPOINTMENT DATE: @8 /11/2018  NEXT APPOINTMENT DATE:@Visit  date not found  DO WE HAVE YOUR PERMISSION TO LEAVE A DETAILED MESSAGE: yes, [phone]  OTHER COMMENTS:    **Let patient know to contact pharmacy at the end of the day to make sure medication is ready. **  ** Please notify patient to allow 48-72 hours to process**  **Encourage patient to contact the pharmacy for refills or they can request refills through [facility]**

## 2018-11-15 MED ORDER — INSULIN PEN NEEDLE 32G X 4 MM MISC: 11 refills | Status: DC

## 2018-11-15 NOTE — Telephone Encounter (Signed)
RX sent

## 2018-11-15 NOTE — Telephone Encounter (Signed)
Patient called back on the morning of Tuesday 11/15/18 and is now out of needles completely and is requesting a refill be sent as soon as possible

## 2018-11-21 ENCOUNTER — Other Ambulatory Visit: Payer: Self-pay

## 2018-11-21 DIAGNOSIS — Z20822 Contact with and (suspected) exposure to covid-19: Secondary | ICD-10-CM

## 2018-11-23 LAB — NOVEL CORONAVIRUS, NAA: SARS-CoV-2, NAA: NOT DETECTED

## 2018-11-24 ENCOUNTER — Other Ambulatory Visit (HOSPITAL_COMMUNITY): Payer: Self-pay | Admitting: Nephrology

## 2018-11-24 DIAGNOSIS — R5383 Other fatigue: Secondary | ICD-10-CM

## 2018-11-24 DIAGNOSIS — I10 Essential (primary) hypertension: Secondary | ICD-10-CM

## 2018-11-24 DIAGNOSIS — R609 Edema, unspecified: Secondary | ICD-10-CM

## 2018-11-24 DIAGNOSIS — N184 Chronic kidney disease, stage 4 (severe): Secondary | ICD-10-CM

## 2018-11-24 DIAGNOSIS — N2581 Secondary hyperparathyroidism of renal origin: Secondary | ICD-10-CM

## 2018-11-28 ENCOUNTER — Ambulatory Visit (HOSPITAL_COMMUNITY): Payer: 59 | Attending: Internal Medicine

## 2018-11-28 ENCOUNTER — Other Ambulatory Visit: Payer: Self-pay

## 2018-11-28 DIAGNOSIS — I10 Essential (primary) hypertension: Secondary | ICD-10-CM

## 2018-11-28 DIAGNOSIS — R5383 Other fatigue: Secondary | ICD-10-CM | POA: Diagnosis present

## 2018-11-28 DIAGNOSIS — N2581 Secondary hyperparathyroidism of renal origin: Secondary | ICD-10-CM | POA: Diagnosis present

## 2018-11-28 DIAGNOSIS — N184 Chronic kidney disease, stage 4 (severe): Secondary | ICD-10-CM

## 2018-11-28 DIAGNOSIS — R609 Edema, unspecified: Secondary | ICD-10-CM | POA: Diagnosis present

## 2018-11-28 MED ORDER — PERFLUTREN LIPID MICROSPHERE
1.0000 mL | INTRAVENOUS | Status: AC | PRN
  Administered 2018-11-28: 09:00:00 3 mL via INTRAVENOUS

## 2018-12-07 ENCOUNTER — Ambulatory Visit: Payer: 59 | Admitting: Cardiology

## 2018-12-12 ENCOUNTER — Other Ambulatory Visit: Payer: Self-pay

## 2018-12-12 DIAGNOSIS — Z20822 Contact with and (suspected) exposure to covid-19: Secondary | ICD-10-CM

## 2018-12-14 LAB — NOVEL CORONAVIRUS, NAA: SARS-CoV-2, NAA: DETECTED — AB

## 2018-12-15 ENCOUNTER — Telehealth: Payer: Self-pay

## 2018-12-15 NOTE — Telephone Encounter (Signed)
Pt received positive results yesterday. Will f/u with him tomorrow (12-16-18).

## 2018-12-20 NOTE — Telephone Encounter (Signed)
Left message on Vm to see how he was feeling.

## 2018-12-20 NOTE — Telephone Encounter (Signed)
Spoke to pt. He said he has some fatigue. He is back to work.

## 2018-12-21 ENCOUNTER — Ambulatory Visit: Payer: 59 | Admitting: Cardiovascular Disease

## 2019-01-11 ENCOUNTER — Other Ambulatory Visit: Payer: Self-pay | Admitting: Internal Medicine

## 2019-01-13 ENCOUNTER — Encounter: Payer: Self-pay | Admitting: Cardiovascular Disease

## 2019-01-13 ENCOUNTER — Ambulatory Visit (HOSPITAL_BASED_OUTPATIENT_CLINIC_OR_DEPARTMENT_OTHER)
Admission: RE | Admit: 2019-01-13 | Discharge: 2019-01-13 | Disposition: A | Discharge status: Home / Self Care | Payer: 59 | Source: Ambulatory Visit | Attending: Cardiovascular Disease | Admitting: Cardiovascular Disease

## 2019-01-13 ENCOUNTER — Other Ambulatory Visit: Payer: Self-pay | Admitting: Cardiovascular Disease

## 2019-01-13 ENCOUNTER — Other Ambulatory Visit: Payer: Self-pay

## 2019-01-13 ENCOUNTER — Ambulatory Visit (INDEPENDENT_AMBULATORY_CARE_PROVIDER_SITE_OTHER): Payer: 59 | Admitting: Cardiovascular Disease

## 2019-01-13 VITALS — BP 136/62 | HR 98 | Temp 98.1°F | Ht 71.0 in | Wt 291.6 lb

## 2019-01-13 DIAGNOSIS — I3139 Other pericardial effusion (noninflammatory): Secondary | ICD-10-CM

## 2019-01-13 DIAGNOSIS — N189 Chronic kidney disease, unspecified: Secondary | ICD-10-CM | POA: Insufficient documentation

## 2019-01-13 DIAGNOSIS — I313 Pericardial effusion (noninflammatory): Secondary | ICD-10-CM | POA: Insufficient documentation

## 2019-01-13 DIAGNOSIS — I10 Essential (primary) hypertension: Secondary | ICD-10-CM

## 2019-01-13 DIAGNOSIS — R0989 Other specified symptoms and signs involving the circulatory and respiratory systems: Secondary | ICD-10-CM

## 2019-01-13 DIAGNOSIS — Z8619 Personal history of other infectious and parasitic diseases: Secondary | ICD-10-CM | POA: Insufficient documentation

## 2019-01-13 DIAGNOSIS — G4733 Obstructive sleep apnea (adult) (pediatric): Secondary | ICD-10-CM | POA: Diagnosis not present

## 2019-01-13 DIAGNOSIS — I3131 Malignant pericardial effusion in diseases classified elsewhere: Secondary | ICD-10-CM

## 2019-01-13 DIAGNOSIS — E782 Mixed hyperlipidemia: Secondary | ICD-10-CM

## 2019-01-13 DIAGNOSIS — C801 Malignant (primary) neoplasm, unspecified: Secondary | ICD-10-CM | POA: Diagnosis not present

## 2019-01-13 DIAGNOSIS — R0602 Shortness of breath: Secondary | ICD-10-CM | POA: Diagnosis not present

## 2019-01-13 LAB — ECHOCARDIOGRAM LIMITED
Height: 71 in
Weight: 4665.6 oz

## 2019-01-13 MED ORDER — PERFLUTREN LIPID MICROSPHERE
1.0000 mL | INTRAVENOUS | Status: AC | PRN
  Administered 2019-01-13: 2 mL via INTRAVENOUS
  Filled 2019-01-13: qty 10

## 2019-01-13 NOTE — Assessment & Plan Note (Signed)
History of essential hypertension blood pressure measured today at 136/62.  He is on amlodipine, lisinopril, metoprolol and hydralazine.
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
## 2019-01-13 NOTE — Patient Instructions (Signed)
Medication Instructions:  Your physician recommends that you continue on your current medications as directed. Please refer to the Current Medication list given to you today.  *If you need a refill on your cardiac medications before your next appointment, please call your pharmacy*  Lab Work: NONE If you have labs (blood work) drawn today and your tests are completely normal, you will receive your results only by: [name] MyChart Message (if you have MyChart) OR . A paper copy in the mail If you have any lab test that is abnormal or we need to change your treatment, we will call you to review the results.  Testing/Procedures: Your physician has requested that you have an echocardiogram. Echocardiography is a painless test that uses sound waves to create images of your heart. It provides your doctor with information about the size and shape of your heart and how well your heart's chambers and valves are working. This procedure takes approximately one hour. There are no restrictions for this procedure. APPOINTMENT AT 1 PM AT  [place]. PLEASE BE THERE AT 12:45 PM   Your physician has requested that you have a carotid duplex. This test is an ultrasound of the carotid arteries in your neck. It looks at blood flow through these arteries that supply the brain with blood. Allow one hour for this exam. There are no restrictions or special instructions.    Follow-Up: At [facility], you and your health needs are our priority.  As part of our continuing mission to provide you with exceptional heart care, we have created designated Provider Care Teams.  These Care Teams include your primary Cardiologist (physician) and Advanced Practice Providers (APPs -  Physician Assistants and Nurse Practitioners) who all work together to provide you with the care you need, when you need it.  Your next appointment:   2  month(s)  The format for your next appointment:   In Person  Provider:   You may see DR. BERRY  or one of the following Advanced Practice Providers on your designated Care Team:    Kerin Ransom, PA-C  [place], [place]  Coletta Memos, [place]

## 2019-01-13 NOTE — Assessment & Plan Note (Addendum)
History of pericardial effusion of unknown etiology with 2D echo performed 11/28/2018 revealing a large circumferential pericardial effusion with echo features of tamponade (RV/RA collapse.  The patient is completely asymptomatic although he does have 2-3+ lower extremity edema.  I am going to recheck an echo today.  If he continues to have tamponade physiology we will arrange for him to have pericardiocentesis next week.

## 2019-01-13 NOTE — Assessment & Plan Note (Signed)
History of hyperlipidemia with lipid profile performed 08/26/2018 revealed a total cholesterol of 62, LDL 17 and HDL 26.  He is on Crestor.

## 2019-01-13 NOTE — Assessment & Plan Note (Signed)
History of pericardial effusion with 2D echo performed 11/28/2018 revealing features consistent with tamponade with RV/RA collapse.  The patient is asymptomatic.  His vital signs are stable.  He denies chest pain or shortness of breath.  He does have 2-3+ pitting edema peripherally.  Etiology of this is unknown.  It may be related to his chronic renal insufficiency, be malignant or idiopathic.  He is on diuretics.  I am going to repeat a 2D echo today.  If he has continued features of tamponade I am going to recommend that he undergo elective pericardiocentesis next week.

## 2019-01-13 NOTE — Progress Notes (Signed)
  Echocardiogram 2D Echocardiogram has been performed.  Randa Lynn [place] 01/13/2019, 2:14 PM

## 2019-01-13 NOTE — Assessment & Plan Note (Signed)
History of obstructive sleep apnea on CPAP which he benefits from 

## 2019-01-14 ENCOUNTER — Emergency Department (HOSPITAL_COMMUNITY): Payer: 59

## 2019-01-14 ENCOUNTER — Other Ambulatory Visit: Payer: Self-pay

## 2019-01-14 ENCOUNTER — Encounter (HOSPITAL_COMMUNITY): Payer: Self-pay | Admitting: Emergency Medicine

## 2019-01-14 ENCOUNTER — Inpatient Hospital Stay (HOSPITAL_COMMUNITY)
Admission: EM | Admit: 2019-01-14 | Discharge: 2019-01-19 | DRG: 229 | Disposition: A | Discharge status: Home / Self Care | Payer: 59 | Attending: Cardiovascular Disease | Admitting: Cardiovascular Disease

## 2019-01-14 DIAGNOSIS — R0609 Other forms of dyspnea: Secondary | ICD-10-CM

## 2019-01-14 DIAGNOSIS — B948 Sequelae of other specified infectious and parasitic diseases: Secondary | ICD-10-CM

## 2019-01-14 DIAGNOSIS — E1122 Type 2 diabetes mellitus with diabetic chronic kidney disease: Secondary | ICD-10-CM

## 2019-01-14 DIAGNOSIS — I3139 Other pericardial effusion (noninflammatory): Secondary | ICD-10-CM

## 2019-01-14 DIAGNOSIS — N185 Chronic kidney disease, stage 5: Secondary | ICD-10-CM

## 2019-01-14 DIAGNOSIS — E785 Hyperlipidemia, unspecified: Secondary | ICD-10-CM | POA: Diagnosis present

## 2019-01-14 DIAGNOSIS — N186 End stage renal disease: Secondary | ICD-10-CM | POA: Diagnosis present

## 2019-01-14 DIAGNOSIS — R0600 Dyspnea, unspecified: Secondary | ICD-10-CM

## 2019-01-14 DIAGNOSIS — Z9889 Other specified postprocedural states: Secondary | ICD-10-CM

## 2019-01-14 DIAGNOSIS — I454 Nonspecific intraventricular block: Secondary | ICD-10-CM | POA: Diagnosis present

## 2019-01-14 DIAGNOSIS — Z8042 Family history of malignant neoplasm of prostate: Secondary | ICD-10-CM

## 2019-01-14 DIAGNOSIS — Z825 Family history of asthma and other chronic lower respiratory diseases: Secondary | ICD-10-CM

## 2019-01-14 DIAGNOSIS — N189 Chronic kidney disease, unspecified: Secondary | ICD-10-CM

## 2019-01-14 DIAGNOSIS — Z9989 Dependence on other enabling machines and devices: Secondary | ICD-10-CM | POA: Diagnosis present

## 2019-01-14 DIAGNOSIS — G4733 Obstructive sleep apnea (adult) (pediatric): Secondary | ICD-10-CM | POA: Diagnosis present

## 2019-01-14 DIAGNOSIS — R778 Other specified abnormalities of plasma proteins: Secondary | ICD-10-CM

## 2019-01-14 DIAGNOSIS — Z7982 Long term (current) use of aspirin: Secondary | ICD-10-CM

## 2019-01-14 DIAGNOSIS — Z09 Encounter for follow-up examination after completed treatment for conditions other than malignant neoplasm: Secondary | ICD-10-CM

## 2019-01-14 DIAGNOSIS — Z833 Family history of diabetes mellitus: Secondary | ICD-10-CM

## 2019-01-14 DIAGNOSIS — I10 Essential (primary) hypertension: Secondary | ICD-10-CM | POA: Diagnosis present

## 2019-01-14 DIAGNOSIS — E119 Type 2 diabetes mellitus without complications: Secondary | ICD-10-CM

## 2019-01-14 DIAGNOSIS — Z79899 Other long term (current) drug therapy: Secondary | ICD-10-CM

## 2019-01-14 DIAGNOSIS — I5032 Chronic diastolic (congestive) heart failure: Secondary | ICD-10-CM | POA: Diagnosis present

## 2019-01-14 DIAGNOSIS — Z87891 Personal history of nicotine dependence: Secondary | ICD-10-CM

## 2019-01-14 DIAGNOSIS — Z8249 Family history of ischemic heart disease and other diseases of the circulatory system: Secondary | ICD-10-CM

## 2019-01-14 DIAGNOSIS — E1142 Type 2 diabetes mellitus with diabetic polyneuropathy: Secondary | ICD-10-CM | POA: Diagnosis present

## 2019-01-14 DIAGNOSIS — N179 Acute kidney failure, unspecified: Secondary | ICD-10-CM | POA: Diagnosis present

## 2019-01-14 DIAGNOSIS — Z5309 Procedure and treatment not carried out because of other contraindication: Secondary | ICD-10-CM | POA: Diagnosis present

## 2019-01-14 DIAGNOSIS — N184 Chronic kidney disease, stage 4 (severe): Secondary | ICD-10-CM | POA: Diagnosis present

## 2019-01-14 DIAGNOSIS — N2581 Secondary hyperparathyroidism of renal origin: Secondary | ICD-10-CM | POA: Diagnosis present

## 2019-01-14 DIAGNOSIS — I313 Pericardial effusion (noninflammatory): Principal | ICD-10-CM | POA: Diagnosis present

## 2019-01-14 DIAGNOSIS — Z794 Long term (current) use of insulin: Secondary | ICD-10-CM

## 2019-01-14 DIAGNOSIS — D631 Anemia in chronic kidney disease: Secondary | ICD-10-CM | POA: Diagnosis present

## 2019-01-14 DIAGNOSIS — I130 Hypertensive heart and chronic kidney disease with heart failure and stage 1 through stage 4 chronic kidney disease, or unspecified chronic kidney disease: Secondary | ICD-10-CM | POA: Diagnosis present

## 2019-01-14 HISTORY — DX: Other pericardial effusion (noninflammatory): I31.39

## 2019-01-14 HISTORY — DX: Pericardial effusion (noninflammatory): I31.3

## 2019-01-14 LAB — COMPREHENSIVE METABOLIC PANEL
ALT: 26 U/L (ref 0–44)
AST: 24 U/L (ref 15–41)
Albumin: 3.8 g/dL (ref 3.5–5.0)
Alkaline Phosphatase: 106 U/L (ref 38–126)
Anion gap: 13 (ref 5–15)
BUN: 88 mg/dL — ABNORMAL HIGH (ref 6–20)
CO2: 21 mmol/L — ABNORMAL LOW (ref 22–32)
Calcium: 9.1 mg/dL (ref 8.9–10.3)
Chloride: 109 mmol/L (ref 98–111)
Creatinine, Ser: 4.73 mg/dL — ABNORMAL HIGH (ref 0.61–1.24)
GFR calc Af Amer: 15 mL/min — ABNORMAL LOW (ref >60)
GFR calc non Af Amer: 13 mL/min — ABNORMAL LOW (ref >60)
Glucose, Bld: 179 mg/dL — ABNORMAL HIGH (ref 70–99)
Potassium: 3.9 mmol/L (ref 3.5–5.1)
Sodium: 143 mmol/L (ref 135–145)
Total Bilirubin: 0.6 mg/dL (ref 0.3–1.2)
Total Protein: 7.1 g/dL (ref 6.5–8.1)

## 2019-01-14 LAB — CBC WITH DIFFERENTIAL/PLATELET
Abs Immature Granulocytes: 0.05 10*3/uL (ref 0.00–0.07)
Basophils Absolute: 0.1 10*3/uL (ref 0.0–0.1)
Basophils Relative: 1 %
Eosinophils Absolute: 0.3 10*3/uL (ref 0.0–0.5)
Eosinophils Relative: 4 %
HCT: 36.5 % — ABNORMAL LOW (ref 39.0–52.0)
Hemoglobin: 12 g/dL — ABNORMAL LOW (ref 13.0–17.0)
Immature Granulocytes: 1 %
Lymphocytes Relative: 17 %
Lymphs Abs: 1.5 10*3/uL (ref 0.7–4.0)
MCH: 29.3 pg (ref 26.0–34.0)
MCHC: 32.9 g/dL (ref 30.0–36.0)
MCV: 89 fL (ref 80.0–100.0)
Monocytes Absolute: 0.8 10*3/uL (ref 0.1–1.0)
Monocytes Relative: 9 %
Neutro Abs: 6.2 10*3/uL (ref 1.7–7.7)
Neutrophils Relative %: 68 %
Platelets: 160 10*3/uL (ref 150–400)
RBC: 4.1 MIL/uL — ABNORMAL LOW (ref 4.22–5.81)
RDW: 13.8 % (ref 11.5–15.5)
WBC: 8.9 10*3/uL (ref 4.0–10.5)
nRBC: 0 % (ref 0.0–0.2)

## 2019-01-14 LAB — TROPONIN I (HIGH SENSITIVITY): Troponin I (High Sensitivity): 26 ng/L — ABNORMAL HIGH (ref <18)

## 2019-01-14 LAB — BRAIN NATRIURETIC PEPTIDE: B Natriuretic Peptide: 273.3 pg/mL — ABNORMAL HIGH (ref 0.0–100.0)

## 2019-01-14 MED ORDER — ALBUTEROL SULFATE (2.5 MG/3ML) 0.083% IN NEBU
5.0000 mg | INHALATION_SOLUTION | Freq: Once | RESPIRATORY_TRACT | Status: DC
  Filled 2019-01-14: qty 6

## 2019-01-14 NOTE — ED Triage Notes (Signed)
Pt c/o increase palpitation and SOB since this morning per pt he and his wife was tested positive for COVID a month ago.

## 2019-01-15 ENCOUNTER — Inpatient Hospital Stay (HOSPITAL_COMMUNITY): Payer: 59

## 2019-01-15 DIAGNOSIS — I130 Hypertensive heart and chronic kidney disease with heart failure and stage 1 through stage 4 chronic kidney disease, or unspecified chronic kidney disease: Secondary | ICD-10-CM | POA: Diagnosis present

## 2019-01-15 DIAGNOSIS — Z79899 Other long term (current) drug therapy: Secondary | ICD-10-CM | POA: Diagnosis not present

## 2019-01-15 DIAGNOSIS — N184 Chronic kidney disease, stage 4 (severe): Secondary | ICD-10-CM | POA: Diagnosis present

## 2019-01-15 DIAGNOSIS — Z794 Long term (current) use of insulin: Secondary | ICD-10-CM | POA: Diagnosis not present

## 2019-01-15 DIAGNOSIS — Z8042 Family history of malignant neoplasm of prostate: Secondary | ICD-10-CM | POA: Diagnosis not present

## 2019-01-15 DIAGNOSIS — E782 Mixed hyperlipidemia: Secondary | ICD-10-CM | POA: Diagnosis not present

## 2019-01-15 DIAGNOSIS — N189 Chronic kidney disease, unspecified: Secondary | ICD-10-CM

## 2019-01-15 DIAGNOSIS — Z8249 Family history of ischemic heart disease and other diseases of the circulatory system: Secondary | ICD-10-CM | POA: Diagnosis not present

## 2019-01-15 DIAGNOSIS — Z833 Family history of diabetes mellitus: Secondary | ICD-10-CM | POA: Diagnosis not present

## 2019-01-15 DIAGNOSIS — E1122 Type 2 diabetes mellitus with diabetic chronic kidney disease: Secondary | ICD-10-CM | POA: Diagnosis present

## 2019-01-15 DIAGNOSIS — B948 Sequelae of other specified infectious and parasitic diseases: Secondary | ICD-10-CM | POA: Diagnosis not present

## 2019-01-15 DIAGNOSIS — I5032 Chronic diastolic (congestive) heart failure: Secondary | ICD-10-CM | POA: Diagnosis present

## 2019-01-15 DIAGNOSIS — I313 Pericardial effusion (noninflammatory): Principal | ICD-10-CM

## 2019-01-15 DIAGNOSIS — R0602 Shortness of breath: Secondary | ICD-10-CM | POA: Diagnosis present

## 2019-01-15 DIAGNOSIS — G4733 Obstructive sleep apnea (adult) (pediatric): Secondary | ICD-10-CM | POA: Diagnosis present

## 2019-01-15 DIAGNOSIS — Z825 Family history of asthma and other chronic lower respiratory diseases: Secondary | ICD-10-CM | POA: Diagnosis not present

## 2019-01-15 DIAGNOSIS — Z5309 Procedure and treatment not carried out because of other contraindication: Secondary | ICD-10-CM | POA: Diagnosis present

## 2019-01-15 DIAGNOSIS — D631 Anemia in chronic kidney disease: Secondary | ICD-10-CM | POA: Diagnosis present

## 2019-01-15 DIAGNOSIS — E785 Hyperlipidemia, unspecified: Secondary | ICD-10-CM | POA: Diagnosis present

## 2019-01-15 DIAGNOSIS — I454 Nonspecific intraventricular block: Secondary | ICD-10-CM | POA: Diagnosis present

## 2019-01-15 DIAGNOSIS — Z9889 Other specified postprocedural states: Secondary | ICD-10-CM | POA: Diagnosis not present

## 2019-01-15 DIAGNOSIS — N2581 Secondary hyperparathyroidism of renal origin: Secondary | ICD-10-CM | POA: Diagnosis present

## 2019-01-15 DIAGNOSIS — Z87891 Personal history of nicotine dependence: Secondary | ICD-10-CM | POA: Diagnosis not present

## 2019-01-15 DIAGNOSIS — E1142 Type 2 diabetes mellitus with diabetic polyneuropathy: Secondary | ICD-10-CM | POA: Diagnosis present

## 2019-01-15 DIAGNOSIS — Z7982 Long term (current) use of aspirin: Secondary | ICD-10-CM | POA: Diagnosis not present

## 2019-01-15 DIAGNOSIS — I10 Essential (primary) hypertension: Secondary | ICD-10-CM

## 2019-01-15 DIAGNOSIS — N179 Acute kidney failure, unspecified: Secondary | ICD-10-CM | POA: Diagnosis present

## 2019-01-15 DIAGNOSIS — E119 Type 2 diabetes mellitus without complications: Secondary | ICD-10-CM

## 2019-01-15 LAB — CBC
HCT: 37.5 % — ABNORMAL LOW (ref 39.0–52.0)
Hemoglobin: 12.1 g/dL — ABNORMAL LOW (ref 13.0–17.0)
MCH: 28.7 pg (ref 26.0–34.0)
MCHC: 32.3 g/dL (ref 30.0–36.0)
MCV: 89.1 fL (ref 80.0–100.0)
Platelets: 169 10*3/uL (ref 150–400)
RBC: 4.21 MIL/uL — ABNORMAL LOW (ref 4.22–5.81)
RDW: 13.9 % (ref 11.5–15.5)
WBC: 8.7 10*3/uL (ref 4.0–10.5)
nRBC: 0 % (ref 0.0–0.2)

## 2019-01-15 LAB — CBG MONITORING, ED
Glucose-Capillary: 170 mg/dL — ABNORMAL HIGH (ref 70–99)
Glucose-Capillary: 204 mg/dL — ABNORMAL HIGH (ref 70–99)
Glucose-Capillary: 215 mg/dL — ABNORMAL HIGH (ref 70–99)
Glucose-Capillary: 231 mg/dL — ABNORMAL HIGH (ref 70–99)

## 2019-01-15 LAB — TROPONIN I (HIGH SENSITIVITY): Troponin I (High Sensitivity): 30 ng/L — ABNORMAL HIGH (ref <18)

## 2019-01-15 LAB — HEMOGLOBIN A1C
Hgb A1c MFr Bld: 9.9 % — ABNORMAL HIGH (ref 4.8–5.6)
Mean Plasma Glucose: 237.43 mg/dL

## 2019-01-15 LAB — CREATININE, SERUM
Creatinine, Ser: 4.51 mg/dL — ABNORMAL HIGH (ref 0.61–1.24)
GFR calc Af Amer: 16 mL/min — ABNORMAL LOW (ref >60)
GFR calc non Af Amer: 13 mL/min — ABNORMAL LOW (ref >60)

## 2019-01-15 LAB — ECHOCARDIOGRAM LIMITED

## 2019-01-15 LAB — SARS CORONAVIRUS 2 (TAT 6-24 HRS): SARS Coronavirus 2: POSITIVE — AB

## 2019-01-15 LAB — HIV ANTIBODY (ROUTINE TESTING W REFLEX): HIV Screen 4th Generation wRfx: NONREACTIVE

## 2019-01-15 MED ORDER — INSULIN ASPART 100 UNIT/ML ~~LOC~~ SOLN
0.0000 [IU] | Freq: Every day | SUBCUTANEOUS | Status: DC
  Administered 2019-01-15: 2 [IU] via SUBCUTANEOUS
  Administered 2019-01-16 – 2019-01-18 (×3): 3 [IU] via SUBCUTANEOUS

## 2019-01-15 MED ORDER — SODIUM CHLORIDE 0.9% FLUSH: 3.0000 mL | INTRAVENOUS | Status: DC | PRN

## 2019-01-15 MED ORDER — METOPROLOL TARTRATE 50 MG PO TABS
100.0000 mg | ORAL_TABLET | Freq: Two times a day (BID) | ORAL | Status: DC
  Administered 2019-01-15 – 2019-01-19 (×8): 100 mg via ORAL
  Filled 2019-01-15 (×4): qty 2
  Filled 2019-01-15 (×2): qty 4
  Filled 2019-01-15 (×2): qty 2

## 2019-01-15 MED ORDER — ACETAMINOPHEN 325 MG PO TABS: 650.0000 mg | ORAL_TABLET | ORAL | Status: DC | PRN

## 2019-01-15 MED ORDER — VITAMIN D 25 MCG (1000 UNIT) PO TABS
2000.0000 [IU] | ORAL_TABLET | Freq: Every day | ORAL | Status: DC
  Administered 2019-01-15 – 2019-01-19 (×3): 2000 [IU] via ORAL
  Filled 2019-01-15 (×3): qty 2

## 2019-01-15 MED ORDER — TORSEMIDE 20 MG PO TABS
40.0000 mg | ORAL_TABLET | Freq: Every day | ORAL | Status: DC
  Administered 2019-01-15: 40 mg via ORAL
  Filled 2019-01-15: qty 2

## 2019-01-15 MED ORDER — CALCITRIOL 0.25 MCG PO CAPS
0.5000 ug | ORAL_CAPSULE | Freq: Every day | ORAL | Status: DC
  Administered 2019-01-15 – 2019-01-19 (×3): 0.5 ug via ORAL
  Filled 2019-01-15: qty 2
  Filled 2019-01-15: qty 1
  Filled 2019-01-15: qty 2
  Filled 2019-01-15: qty 1

## 2019-01-15 MED ORDER — SODIUM CHLORIDE 0.9% FLUSH
3.0000 mL | Freq: Two times a day (BID) | INTRAVENOUS | Status: DC
  Administered 2019-01-15 – 2019-01-16 (×3): 3 mL via INTRAVENOUS

## 2019-01-15 MED ORDER — INSULIN GLARGINE 100 UNIT/ML ~~LOC~~ SOLN
35.0000 [IU] | Freq: Every day | SUBCUTANEOUS | Status: DC
  Administered 2019-01-15 – 2019-01-18 (×4): 35 [IU] via SUBCUTANEOUS
  Filled 2019-01-15 (×6): qty 0.35

## 2019-01-15 MED ORDER — SODIUM CHLORIDE 0.9 % IV SOLN: 250.0000 mL | INTRAVENOUS | Status: DC | PRN

## 2019-01-15 MED ORDER — HYDRALAZINE HCL 50 MG PO TABS
100.0000 mg | ORAL_TABLET | Freq: Three times a day (TID) | ORAL | Status: DC
  Administered 2019-01-15 – 2019-01-19 (×12): 100 mg via ORAL
  Filled 2019-01-15 (×15): qty 2

## 2019-01-15 MED ORDER — HEPARIN SODIUM (PORCINE) 5000 UNIT/ML IJ SOLN
5000.0000 [IU] | Freq: Three times a day (TID) | INTRAMUSCULAR | Status: AC
  Administered 2019-01-15: 5000 [IU] via SUBCUTANEOUS
  Filled 2019-01-15 (×2): qty 1

## 2019-01-15 MED ORDER — FERROUS SULFATE 325 (65 FE) MG PO TABS
650.0000 mg | ORAL_TABLET | Freq: Every day | ORAL | Status: DC
  Administered 2019-01-15 – 2019-01-19 (×3): 650 mg via ORAL
  Filled 2019-01-15 (×3): qty 2

## 2019-01-15 MED ORDER — INSULIN ASPART 100 UNIT/ML ~~LOC~~ SOLN
0.0000 [IU] | Freq: Three times a day (TID) | SUBCUTANEOUS | Status: DC
  Administered 2019-01-15: 7 [IU] via SUBCUTANEOUS
  Administered 2019-01-15: 4 [IU] via SUBCUTANEOUS
  Administered 2019-01-15: 7 [IU] via SUBCUTANEOUS
  Administered 2019-01-16: 4 [IU] via SUBCUTANEOUS
  Administered 2019-01-17: 7 [IU] via SUBCUTANEOUS
  Administered 2019-01-18: 11 [IU] via SUBCUTANEOUS
  Administered 2019-01-18: 4 [IU] via SUBCUTANEOUS
  Administered 2019-01-18: 15 [IU] via SUBCUTANEOUS
  Administered 2019-01-19: 7 [IU] via SUBCUTANEOUS
  Administered 2019-01-19: 4 [IU] via SUBCUTANEOUS

## 2019-01-15 MED ORDER — SODIUM CHLORIDE 0.9% FLUSH
3.0000 mL | Freq: Two times a day (BID) | INTRAVENOUS | Status: DC
  Administered 2019-01-16: 3 mL via INTRAVENOUS

## 2019-01-15 MED ORDER — DOXAZOSIN MESYLATE 4 MG PO TABS
4.0000 mg | ORAL_TABLET | Freq: Every day | ORAL | Status: DC
  Administered 2019-01-15 – 2019-01-18 (×4): 4 mg via ORAL
  Filled 2019-01-15 (×7): qty 1

## 2019-01-15 MED ORDER — ONDANSETRON HCL 4 MG/2ML IJ SOLN: 4.0000 mg | Freq: Four times a day (QID) | INTRAMUSCULAR | Status: DC | PRN

## 2019-01-15 MED ORDER — ROSUVASTATIN CALCIUM 20 MG PO TABS
20.0000 mg | ORAL_TABLET | Freq: Every day | ORAL | Status: DC
  Administered 2019-01-15 – 2019-01-19 (×4): 20 mg via ORAL
  Filled 2019-01-15 (×5): qty 1

## 2019-01-15 NOTE — ED Notes (Signed)
Breakfast ordered 

## 2019-01-15 NOTE — ED Notes (Signed)
Lunch Tray Ordered@ 1148. 
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
## 2019-01-15 NOTE — ED Notes (Signed)
Dinner Tray Ordered @ 1842. 

## 2019-01-15 NOTE — H&P (View-Only) (Signed)
Brief cardiology update note  Patient seen and examined. He is feeling much better, has made 3-4 jugs of urine. Still has 1-2+ bilateral pitting edema. He had discussed pericardiocentesis with [name] in the near future. We talked about the pros and cons of attempted pericardiocentesis now vs. Coming back as outpatient. Clinically he appears comfortable, breathing easily, but the rising creatinine and pitting edema is concerning to me. I read his echo today, which is not consistent (nor is his clinical picture) with tamponade, but he needs at least a diagnostic tap to determine etiology. As this was planned in the near future, but he presented with shortness of breath and edema, it is reasonable to pursue this tomorrow if able.  Risks and benefits of cardiac catheterization have been discussed with the patient.  These include bleeding, infection, kidney damage, stroke, heart attack, death.  The patient understands these risks and is willing to proceed. Specific risks of pericardiocentesis discussed.  Will make NPO at midnight, plan for pericardiocentesis tomorrow. Of note, please see my earlier discussion with Dr. Lorin Mercy. He may remain covid positive by testing for up to 90 days. He no longer has any clinical symptoms of covid, and he is more than 21 days out from his diagnosis. Based on this, he does not need to be treated with covid + protocols. Still reasonable to be cautious, however.  Discussed with patient, all questions answered. Placed on cath board, orders are in. Will hold heparin SQ after PM dose.  Buford Dresser, MD, PhD [facility]  [address], [place], [place] [address] [phone]

## 2019-01-15 NOTE — Progress Notes (Addendum)
Brief cardiology update note  Patient seen and examined. He is feeling much better, has made 3-4 jugs of urine. Still has 1-2+ bilateral pitting edema. He had discussed pericardiocentesis with [name] in the near future. We talked about the pros and cons of attempted pericardiocentesis now vs. Coming back as outpatient. Clinically he appears comfortable, breathing easily, but the rising creatinine and pitting edema is concerning to me. I read his echo today, which is not consistent (nor is his clinical picture) with tamponade, but he needs at least a diagnostic tap to determine etiology. As this was planned in the near future, but he presented with shortness of breath and edema, it is reasonable to pursue this tomorrow if able.  Risks and benefits of cardiac catheterization have been discussed with the patient.  These include bleeding, infection, kidney damage, stroke, heart attack, death.  The patient understands these risks and is willing to proceed. Specific risks of pericardiocentesis discussed.  Will make NPO at midnight, plan for pericardiocentesis tomorrow. Of note, please see my earlier discussion with Dr. Lorin Mercy. He may remain covid positive by testing for up to 90 days. He no longer has any clinical symptoms of covid, and he is more than 21 days out from his diagnosis. Based on this, he does not need to be treated with covid + protocols. Still reasonable to be cautious, however.  Discussed with patient, all questions answered. Placed on cath board, orders are in. Will hold heparin SQ after PM dose.  Buford Dresser, MD, PhD [facility]  [address] [place], [place] [address] [phone]

## 2019-01-15 NOTE — Progress Notes (Signed)
Brief cardiology update: Spoke with Dr. Lorin Mercy re: protocol for Covid positive test. Greatly appreciate the assistance. As patient's test was positive greater than 21 days, and there is no evidence of active covid symptoms (clear chest x-ray, no fevers), he is no longer considered to be actively infected with Covid. Test can remain positive for 90 days. He therefore does not need active Covid precautions per protocol.  Buford Dresser, MD, PhD [facility]  [address] [place], [place] [address] [phone]

## 2019-01-15 NOTE — Progress Notes (Signed)
  Echocardiogram 2D Echocardiogram has been performed.  Samuel Reed 01/15/2019, 9:10 AM

## 2019-01-16 ENCOUNTER — Encounter (HOSPITAL_COMMUNITY)
Admission: EM | Disposition: A | Discharge status: Home / Self Care | Payer: Self-pay | Source: Home / Self Care | Attending: Cardiovascular Disease

## 2019-01-16 ENCOUNTER — Inpatient Hospital Stay (HOSPITAL_COMMUNITY): Payer: 59

## 2019-01-16 DIAGNOSIS — I313 Pericardial effusion (noninflammatory): Secondary | ICD-10-CM

## 2019-01-16 HISTORY — PX: PERICARDIOCENTESIS: CATH118255

## 2019-01-16 LAB — CBG MONITORING, ED: Glucose-Capillary: 166 mg/dL — ABNORMAL HIGH (ref 70–99)

## 2019-01-16 LAB — BASIC METABOLIC PANEL
Anion gap: 12 (ref 5–15)
BUN: 75 mg/dL — ABNORMAL HIGH (ref 6–20)
CO2: 23 mmol/L (ref 22–32)
Calcium: 9 mg/dL (ref 8.9–10.3)
Chloride: 110 mmol/L (ref 98–111)
Creatinine, Ser: 3.77 mg/dL — ABNORMAL HIGH (ref 0.61–1.24)
GFR calc Af Amer: 19 mL/min — ABNORMAL LOW (ref >60)
GFR calc non Af Amer: 17 mL/min — ABNORMAL LOW (ref >60)
Glucose, Bld: 191 mg/dL — ABNORMAL HIGH (ref 70–99)
Potassium: 3.6 mmol/L (ref 3.5–5.1)
Sodium: 145 mmol/L (ref 135–145)

## 2019-01-16 LAB — CBC
HCT: 36.3 % — ABNORMAL LOW (ref 39.0–52.0)
Hemoglobin: 11.9 g/dL — ABNORMAL LOW (ref 13.0–17.0)
MCH: 28.7 pg (ref 26.0–34.0)
MCHC: 32.8 g/dL (ref 30.0–36.0)
MCV: 87.7 fL (ref 80.0–100.0)
Platelets: 150 10*3/uL (ref 150–400)
RBC: 4.14 MIL/uL — ABNORMAL LOW (ref 4.22–5.81)
RDW: 13.7 % (ref 11.5–15.5)
WBC: 8.5 10*3/uL (ref 4.0–10.5)
nRBC: 0 % (ref 0.0–0.2)

## 2019-01-16 LAB — COMPREHENSIVE METABOLIC PANEL
ALT: 22 U/L (ref 0–44)
AST: 20 U/L (ref 15–41)
Albumin: 3.7 g/dL (ref 3.5–5.0)
Alkaline Phosphatase: 73 U/L (ref 38–126)
Anion gap: 13 (ref 5–15)
BUN: 72 mg/dL — ABNORMAL HIGH (ref 6–20)
CO2: 21 mmol/L — ABNORMAL LOW (ref 22–32)
Calcium: 8.8 mg/dL — ABNORMAL LOW (ref 8.9–10.3)
Chloride: 107 mmol/L (ref 98–111)
Creatinine, Ser: 3.4 mg/dL — ABNORMAL HIGH (ref 0.61–1.24)
GFR calc Af Amer: 22 mL/min — ABNORMAL LOW (ref >60)
GFR calc non Af Amer: 19 mL/min — ABNORMAL LOW (ref >60)
Glucose, Bld: 257 mg/dL — ABNORMAL HIGH (ref 70–99)
Potassium: 3.8 mmol/L (ref 3.5–5.1)
Sodium: 141 mmol/L (ref 135–145)
Total Bilirubin: 1.3 mg/dL — ABNORMAL HIGH (ref 0.3–1.2)
Total Protein: 6.7 g/dL (ref 6.5–8.1)

## 2019-01-16 LAB — LIPID PANEL
Cholesterol: 92 mg/dL (ref 0–200)
HDL: 23 mg/dL — ABNORMAL LOW (ref >40)
LDL Cholesterol: 26 mg/dL (ref 0–99)
Total CHOL/HDL Ratio: 4 RATIO
Triglycerides: 216 mg/dL — ABNORMAL HIGH (ref <150)
VLDL: 43 mg/dL — ABNORMAL HIGH (ref 0–40)

## 2019-01-16 LAB — MRSA PCR SCREENING: MRSA by PCR: NEGATIVE

## 2019-01-16 LAB — PROTIME-INR
INR: 1.1 (ref 0.8–1.2)
INR: 1.1 (ref 0.8–1.2)
Prothrombin Time: 14.1 seconds (ref 11.4–15.2)
Prothrombin Time: 14.4 seconds (ref 11.4–15.2)

## 2019-01-16 LAB — ECHOCARDIOGRAM LIMITED

## 2019-01-16 LAB — APTT: aPTT: 30 seconds (ref 24–36)

## 2019-01-16 LAB — GLUCOSE, CAPILLARY
Glucose-Capillary: 151 mg/dL — ABNORMAL HIGH (ref 70–99)
Glucose-Capillary: 268 mg/dL — ABNORMAL HIGH (ref 70–99)

## 2019-01-16 LAB — HEMOGLOBIN A1C
Hgb A1c MFr Bld: 9.7 % — ABNORMAL HIGH (ref 4.8–5.6)
Mean Plasma Glucose: 231.69 mg/dL

## 2019-01-16 LAB — TROPONIN I (HIGH SENSITIVITY): Troponin I (High Sensitivity): 214 ng/L (ref <18)

## 2019-01-16 LAB — ABO/RH: ABO/RH(D): O POS

## 2019-01-16 SURGERY — PERICARDIOCENTESIS
Anesthesia: LOCAL

## 2019-01-16 MED ORDER — SODIUM CHLORIDE 0.9 % IV SOLN: INTRAVENOUS | Status: DC

## 2019-01-16 MED ORDER — LIDOCAINE HCL (PF) 1 % IJ SOLN
INTRAMUSCULAR | Status: DC | PRN
  Administered 2019-01-16: 10 mL
  Administered 2019-01-16: 8 mL

## 2019-01-16 MED ORDER — MIDAZOLAM HCL 2 MG/2ML IJ SOLN
INTRAMUSCULAR | Status: AC
  Filled 2019-01-16: qty 2

## 2019-01-16 MED ORDER — MIDAZOLAM HCL 2 MG/2ML IJ SOLN
INTRAMUSCULAR | Status: DC | PRN
  Administered 2019-01-16: 2 mg via INTRAVENOUS
  Administered 2019-01-16: 1 mg via INTRAVENOUS
  Administered 2019-01-16: 2 mg via INTRAVENOUS

## 2019-01-16 MED ORDER — FENTANYL CITRATE (PF) 100 MCG/2ML IJ SOLN
INTRAMUSCULAR | Status: AC
  Filled 2019-01-16: qty 2

## 2019-01-16 MED ORDER — HEPARIN (PORCINE) IN NACL 1000-0.9 UT/500ML-% IV SOLN
INTRAVENOUS | Status: DC | PRN
  Administered 2019-01-16: 500 mL

## 2019-01-16 MED ORDER — CHLORHEXIDINE GLUCONATE CLOTH 2 % EX PADS
6.0000 | MEDICATED_PAD | Freq: Every day | CUTANEOUS | Status: DC
  Administered 2019-01-16: 6 via TOPICAL

## 2019-01-16 MED ORDER — SODIUM CHLORIDE 0.9% FLUSH: 3.0000 mL | INTRAVENOUS | Status: DC | PRN

## 2019-01-16 MED ORDER — LIDOCAINE HCL (PF) 1 % IJ SOLN
INTRAMUSCULAR | Status: AC
  Filled 2019-01-16: qty 30

## 2019-01-16 MED ORDER — SODIUM CHLORIDE 0.9 % IV SOLN: 250.0000 mL | INTRAVENOUS | Status: DC | PRN

## 2019-01-16 MED ORDER — FENTANYL CITRATE (PF) 100 MCG/2ML IJ SOLN
INTRAMUSCULAR | Status: DC | PRN
  Administered 2019-01-16 (×2): 50 ug via INTRAVENOUS

## 2019-01-16 MED ORDER — HEPARIN (PORCINE) IN NACL 1000-0.9 UT/500ML-% IV SOLN
INTRAVENOUS | Status: AC
  Filled 2019-01-16: qty 1000

## 2019-01-16 SURGICAL SUPPLY — 4 items
CATH ANGIO 5F PIGTAIL 65CM (CATHETERS) ×1 IMPLANT
PERIVAC PERICARDIOCENTESIS 8.3 (TRAY / TRAY PROCEDURE) ×1 IMPLANT
SHEATH PROBE COVER 6X72 (BAG) ×1 IMPLANT
WIRE EMERALD 3MM-J .035X150CM (WIRE) ×1 IMPLANT

## 2019-01-16 NOTE — ED Notes (Signed)
Cath lab ready for pt.
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
## 2019-01-16 NOTE — Progress Notes (Signed)
  Echocardiogram 2D Echocardiogram limited has been performed.  Samuel Reed M 01/16/2019, 2:47 PM

## 2019-01-16 NOTE — Interval H&P Note (Signed)
History and Physical Interval Note:  01/16/2019 1:02 PM  Samuel Reed  has presented today for surgery, with the diagnosis of pericardial effusion.  The various methods of treatment have been discussed with the patient and family. After consideration of risks, benefits and other options for treatment, the patient has consented to  Procedure(s): PERICARDIOCENTESIS (N/A) as a surgical intervention.  The patient's history has been reviewed, patient examined, no change in status, stable for surgery.  I have reviewed the patient's chart and labs.  Questions were answered to the patient's satisfaction.     Shelva Majestic

## 2019-01-17 ENCOUNTER — Inpatient Hospital Stay (HOSPITAL_COMMUNITY): Payer: 59 | Admitting: Anesthesiology

## 2019-01-17 ENCOUNTER — Encounter (HOSPITAL_COMMUNITY)
Admission: EM | Disposition: A | Discharge status: Home / Self Care | Payer: Self-pay | Source: Home / Self Care | Attending: Cardiovascular Disease

## 2019-01-17 ENCOUNTER — Inpatient Hospital Stay (HOSPITAL_COMMUNITY): Payer: 59

## 2019-01-17 DIAGNOSIS — Z9889 Other specified postprocedural states: Secondary | ICD-10-CM | POA: Diagnosis present

## 2019-01-17 HISTORY — PX: VIDEO ASSISTED THORACOSCOPY: SHX5073

## 2019-01-17 LAB — GLUCOSE, CAPILLARY
Glucose-Capillary: 194 mg/dL — ABNORMAL HIGH (ref 70–99)
Glucose-Capillary: 185 mg/dL — ABNORMAL HIGH (ref 70–99)
Glucose-Capillary: 225 mg/dL — ABNORMAL HIGH (ref 70–99)
Glucose-Capillary: 291 mg/dL — ABNORMAL HIGH (ref 70–99)

## 2019-01-17 LAB — PTT FACTOR INHIBITOR (MIXING STUDY): aPTT: 25.7 s (ref 22.9–30.2)

## 2019-01-17 LAB — BASIC METABOLIC PANEL
Anion gap: 12 (ref 5–15)
BUN: 69 mg/dL — ABNORMAL HIGH (ref 6–20)
CO2: 23 mmol/L (ref 22–32)
Calcium: 8.8 mg/dL — ABNORMAL LOW (ref 8.9–10.3)
Chloride: 108 mmol/L (ref 98–111)
Creatinine, Ser: 3.09 mg/dL — ABNORMAL HIGH (ref 0.61–1.24)
GFR calc Af Amer: 25 mL/min — ABNORMAL LOW (ref >60)
GFR calc non Af Amer: 21 mL/min — ABNORMAL LOW (ref >60)
Glucose, Bld: 246 mg/dL — ABNORMAL HIGH (ref 70–99)
Potassium: 3.7 mmol/L (ref 3.5–5.1)
Sodium: 143 mmol/L (ref 135–145)

## 2019-01-17 LAB — GRAM STAIN

## 2019-01-17 LAB — PREPARE RBC (CROSSMATCH)

## 2019-01-17 SURGERY — VIDEO ASSISTED THORACOSCOPY
Anesthesia: General | Site: Chest | Laterality: Right

## 2019-01-17 MED ORDER — 0.9 % SODIUM CHLORIDE (POUR BTL) OPTIME
TOPICAL | Status: DC | PRN
  Administered 2019-01-17: 3000 mL

## 2019-01-17 MED ORDER — CEFAZOLIN SODIUM-DEXTROSE 2-4 GM/100ML-% IV SOLN
2.0000 g | Freq: Three times a day (TID) | INTRAVENOUS | Status: AC
  Administered 2019-01-17 (×2): 2 g via INTRAVENOUS
  Filled 2019-01-17 (×2): qty 100

## 2019-01-17 MED ORDER — ONDANSETRON HCL 4 MG/2ML IJ SOLN
INTRAMUSCULAR | Status: DC | PRN
  Administered 2019-01-17: 4 mg via INTRAVENOUS

## 2019-01-17 MED ORDER — CHLORHEXIDINE GLUCONATE CLOTH 2 % EX PADS
6.0000 | MEDICATED_PAD | Freq: Every day | CUTANEOUS | Status: DC
  Administered 2019-01-17 – 2019-01-18 (×2): 6 via TOPICAL

## 2019-01-17 MED ORDER — FENTANYL CITRATE (PF) 250 MCG/5ML IJ SOLN
INTRAMUSCULAR | Status: DC | PRN
  Administered 2019-01-17: 100 ug via INTRAVENOUS
  Administered 2019-01-17 (×2): 50 ug via INTRAVENOUS

## 2019-01-17 MED ORDER — BISACODYL 5 MG PO TBEC
10.0000 mg | DELAYED_RELEASE_TABLET | Freq: Every day | ORAL | Status: DC
  Administered 2019-01-18 – 2019-01-19 (×2): 10 mg via ORAL
  Filled 2019-01-17 (×2): qty 2

## 2019-01-17 MED ORDER — TRAMADOL HCL 50 MG PO TABS: 50.0000 mg | ORAL_TABLET | Freq: Four times a day (QID) | ORAL | Status: DC | PRN

## 2019-01-17 MED ORDER — SODIUM CHLORIDE FLUSH 0.9 % IV SOLN
INTRAVENOUS | Status: DC | PRN
  Administered 2019-01-17: 50 mL

## 2019-01-17 MED ORDER — SODIUM CHLORIDE 0.9 % IV SOLN: INTRAVENOUS | Status: DC | PRN

## 2019-01-17 MED ORDER — MIDAZOLAM HCL 5 MG/5ML IJ SOLN
INTRAMUSCULAR | Status: DC | PRN
  Administered 2019-01-17: 2 mg via INTRAVENOUS

## 2019-01-17 MED ORDER — PHENYLEPHRINE HCL-NACL 10-0.9 MG/250ML-% IV SOLN
INTRAVENOUS | Status: DC | PRN
  Administered 2019-01-17: 50 ug/min via INTRAVENOUS

## 2019-01-17 MED ORDER — CEFAZOLIN SODIUM-DEXTROSE 1-4 GM/50ML-% IV SOLN
INTRAVENOUS | Status: DC | PRN
  Administered 2019-01-17: 2 g via INTRAVENOUS

## 2019-01-17 MED ORDER — ACETAMINOPHEN 160 MG/5ML PO SOLN: 1000.0000 mg | Freq: Four times a day (QID) | ORAL | Status: DC

## 2019-01-17 MED ORDER — ROCURONIUM BROMIDE 10 MG/ML (PF) SYRINGE
PREFILLED_SYRINGE | INTRAVENOUS | Status: DC | PRN
  Administered 2019-01-17: 60 mg via INTRAVENOUS
  Administered 2019-01-17: 40 mg via INTRAVENOUS

## 2019-01-17 MED ORDER — FENTANYL CITRATE (PF) 100 MCG/2ML IJ SOLN: 25.0000 ug | INTRAMUSCULAR | Status: DC | PRN

## 2019-01-17 MED ORDER — SUCCINYLCHOLINE CHLORIDE 20 MG/ML IJ SOLN
INTRAMUSCULAR | Status: DC | PRN
  Administered 2019-01-17: 20 mg via INTRAVENOUS
  Administered 2019-01-17: 140 mg via INTRAVENOUS
  Administered 2019-01-17: 40 mg via INTRAVENOUS

## 2019-01-17 MED ORDER — FENTANYL CITRATE (PF) 100 MCG/2ML IJ SOLN
INTRAMUSCULAR | Status: AC
  Filled 2019-01-17: qty 2

## 2019-01-17 MED ORDER — OXYCODONE HCL 5 MG PO TABS
5.0000 mg | ORAL_TABLET | ORAL | Status: DC | PRN
  Administered 2019-01-17: 10 mg via ORAL
  Filled 2019-01-17: qty 2

## 2019-01-17 MED ORDER — ACETAMINOPHEN 500 MG PO TABS
1000.0000 mg | ORAL_TABLET | Freq: Four times a day (QID) | ORAL | Status: DC
  Administered 2019-01-17 – 2019-01-19 (×7): 1000 mg via ORAL
  Filled 2019-01-17 (×6): qty 2

## 2019-01-17 MED ORDER — ETOMIDATE 2 MG/ML IV SOLN
INTRAVENOUS | Status: DC | PRN
  Administered 2019-01-17 (×2): 2 mg via INTRAVENOUS
  Administered 2019-01-17: 16 mg via INTRAVENOUS

## 2019-01-17 MED ORDER — DEXAMETHASONE SODIUM PHOSPHATE 10 MG/ML IJ SOLN
INTRAMUSCULAR | Status: DC | PRN
  Administered 2019-01-17: 10 mg via INTRAVENOUS

## 2019-01-17 MED ORDER — SUGAMMADEX SODIUM 500 MG/5ML IV SOLN
INTRAVENOUS | Status: DC | PRN
  Administered 2019-01-17: 500 mg via INTRAVENOUS

## 2019-01-17 MED ORDER — SUGAMMADEX SODIUM 500 MG/5ML IV SOLN
INTRAVENOUS | Status: AC
  Filled 2019-01-17: qty 5

## 2019-01-17 MED ORDER — BUPIVACAINE HCL (PF) 0.5 % IJ SOLN
INTRAMUSCULAR | Status: AC
  Filled 2019-01-17: qty 30

## 2019-01-17 MED ORDER — MIDAZOLAM HCL 2 MG/2ML IJ SOLN
INTRAMUSCULAR | Status: AC
  Filled 2019-01-17: qty 2

## 2019-01-17 MED ORDER — PROPOFOL 10 MG/ML IV BOLUS
INTRAVENOUS | Status: AC
  Filled 2019-01-17: qty 20

## 2019-01-17 MED ORDER — LACTATED RINGERS IV SOLN: INTRAVENOUS | Status: DC | PRN

## 2019-01-17 MED ORDER — FENTANYL CITRATE (PF) 250 MCG/5ML IJ SOLN
INTRAMUSCULAR | Status: AC
  Filled 2019-01-17: qty 5

## 2019-01-17 MED ORDER — BUPIVACAINE LIPOSOME 1.3 % IJ SUSP
20.0000 mL | Freq: Once | INTRAMUSCULAR | Status: DC
  Filled 2019-01-17: qty 20

## 2019-01-17 MED ORDER — ENOXAPARIN SODIUM 40 MG/0.4ML ~~LOC~~ SOLN
40.0000 mg | Freq: Every day | SUBCUTANEOUS | Status: DC
  Administered 2019-01-18 – 2019-01-19 (×2): 40 mg via SUBCUTANEOUS
  Filled 2019-01-17 (×2): qty 0.4

## 2019-01-17 MED ORDER — LIDOCAINE HCL (CARDIAC) PF 100 MG/5ML IV SOSY
PREFILLED_SYRINGE | INTRAVENOUS | Status: DC | PRN
  Administered 2019-01-17: 60 mg via INTRAVENOUS

## 2019-01-17 MED ORDER — SENNOSIDES-DOCUSATE SODIUM 8.6-50 MG PO TABS
1.0000 | ORAL_TABLET | Freq: Every day | ORAL | Status: DC
  Administered 2019-01-17 – 2019-01-18 (×2): 1 via ORAL
  Filled 2019-01-17 (×2): qty 1

## 2019-01-17 MED FILL — Lidocaine HCl Local Preservative Free (PF) Inj 1%: INTRAMUSCULAR | Qty: 30 | Status: AC

## 2019-01-17 SURGICAL SUPPLY — 79 items
ADAPTER CARDIO PERF ANTE/RETRO (ADAPTER) ×1 IMPLANT
ADH SKN CLS APL DERMABOND .7 (GAUZE/BANDAGES/DRESSINGS) ×1
ADPR PRFSN 84XANTGRD RTRGD (ADAPTER)
APL PRP STRL LF DISP 70% ISPRP (MISCELLANEOUS) ×1
BAG DECANTER FOR FLEXI CONT (MISCELLANEOUS) ×1 IMPLANT
BLADE CLIPPER SURG (BLADE) ×2 IMPLANT
BLADE STERNUM SYSTEM 6 (BLADE) ×1 IMPLANT
CANISTER SUCT 3000ML PPV (MISCELLANEOUS) ×2 IMPLANT
CHLORAPREP W/TINT 26 (MISCELLANEOUS) ×1 IMPLANT
CONN ST 1/4X3/8  BEN (MISCELLANEOUS)
CONN ST 1/4X3/8 BEN (MISCELLANEOUS) ×2 IMPLANT
CONT SPEC 4OZ CLIKSEAL STRL BL (MISCELLANEOUS) ×3 IMPLANT
COVER WAND RF STERILE (DRAPES) ×1 IMPLANT
DEFOGGER SCOPE WARMER CLEARIFY (MISCELLANEOUS) ×1 IMPLANT
DERMABOND ADVANCED (GAUZE/BANDAGES/DRESSINGS) ×1
DERMABOND ADVANCED .7 DNX12 (GAUZE/BANDAGES/DRESSINGS) ×1 IMPLANT
DRAIN CHANNEL 19F RND (DRAIN) ×1 IMPLANT
DRAIN CHANNEL 28F RND 3/8 FF (WOUND CARE) ×2 IMPLANT
DRAIN CONNECTOR BLAKE 1:1 (MISCELLANEOUS) IMPLANT
DRAPE CARDIOVASCULAR INCISE (DRAPES)
DRAPE SLUSH/WARMER DISC (DRAPES) ×2 IMPLANT
DRAPE SRG 135X102X78XABS (DRAPES) ×1 IMPLANT
DRSG COVADERM 4X14 (GAUZE/BANDAGES/DRESSINGS) ×1 IMPLANT
ELECT BLADE 6.5 EXT (BLADE) IMPLANT
ELECT CAUTERY BLADE 6.4 (BLADE) ×1 IMPLANT
ELECT REM PT RETURN 9FT ADLT (ELECTROSURGICAL) ×2
ELECTRODE REM PT RTRN 9FT ADLT (ELECTROSURGICAL) ×1 IMPLANT
EVACUATOR SILICONE 100CC (DRAIN) ×1 IMPLANT
FELT TEFLON 1X6 (MISCELLANEOUS) ×2 IMPLANT
GAUZE SPONGE 4X4 12PLY STRL (GAUZE/BANDAGES/DRESSINGS) ×2 IMPLANT
GAUZE SPONGE 4X4 12PLY STRL LF (GAUZE/BANDAGES/DRESSINGS) IMPLANT
GLOVE BIO SURGEON STRL SZ 6 (GLOVE) IMPLANT
GLOVE BIO SURGEON STRL SZ 6.5 (GLOVE) IMPLANT
GLOVE BIO SURGEON STRL SZ7 (GLOVE) ×5 IMPLANT
GLOVE BIO SURGEON STRL SZ7.5 (GLOVE) IMPLANT
GLOVE BIOGEL PI IND STRL 6 (GLOVE) IMPLANT
GLOVE BIOGEL PI IND STRL 6.5 (GLOVE) IMPLANT
GLOVE BIOGEL PI INDICATOR 6 (GLOVE) ×2
GLOVE BIOGEL PI INDICATOR 6.5 (GLOVE) ×1
GOWN STRL REUS W/ TWL LRG LVL3 (GOWN DISPOSABLE) ×3 IMPLANT
GOWN STRL REUS W/ TWL XL LVL3 (GOWN DISPOSABLE) ×2 IMPLANT
GOWN STRL REUS W/TWL LRG LVL3 (GOWN DISPOSABLE) ×6
GOWN STRL REUS W/TWL XL LVL3 (GOWN DISPOSABLE) ×2
KIT BASIN OR (CUSTOM PROCEDURE TRAY) ×2 IMPLANT
KIT SUCTION CATH 14FR (SUCTIONS) ×1 IMPLANT
KIT TURNOVER KIT B (KITS) ×2 IMPLANT
L-HOOK LAP DISP 36CM (ELECTROSURGICAL) ×2
LHOOK LAP DISP 36CM (ELECTROSURGICAL) IMPLANT
NEEDLE 22X1 1/2 (OR ONLY) (NEEDLE) IMPLANT
NS IRRIG 1000ML POUR BTL (IV SOLUTION) ×3 IMPLANT
PACK CHEST (CUSTOM PROCEDURE TRAY) ×2 IMPLANT
PAD ARMBOARD 7.5X6 YLW CONV (MISCELLANEOUS) ×4 IMPLANT
POSITIONER HEAD DONUT 9IN (MISCELLANEOUS) ×2 IMPLANT
SET IRRIG TUBING LAPAROSCOPIC (IRRIGATION / IRRIGATOR) ×1 IMPLANT
STOPCOCK 4 WAY LG BORE MALE ST (IV SETS) ×1 IMPLANT
SUT BONE WAX W31G (SUTURE) ×1 IMPLANT
SUT MNCRL AB 4-0 PS2 18 (SUTURE) ×4 IMPLANT
SUT MON AB 2-0 CT1 36 (SUTURE) IMPLANT
SUT PROLENE 3 0 SH DA (SUTURE) IMPLANT
SUT SILK  1 MH (SUTURE) ×2
SUT SILK 1 MH (SUTURE) ×2 IMPLANT
SUT VIC AB 2-0 CT1 27 (SUTURE) ×2
SUT VIC AB 2-0 CT1 TAPERPNT 27 (SUTURE) IMPLANT
SUT VIC AB 3-0 SH 18 (SUTURE) ×2 IMPLANT
SUT VIC AB 3-0 SH 27 (SUTURE) ×2
SUT VIC AB 3-0 SH 27X BRD (SUTURE) IMPLANT
SYR 50ML LL SCALE MARK (SYRINGE) ×1 IMPLANT
SYSTEM SAHARA CHEST DRAIN ATS (WOUND CARE) ×1 IMPLANT
TAPE CLOTH SURG 4X10 WHT LF (GAUZE/BANDAGES/DRESSINGS) ×1 IMPLANT
TOWEL GREEN STERILE (TOWEL DISPOSABLE) ×2 IMPLANT
TOWEL GREEN STERILE FF (TOWEL DISPOSABLE) ×2 IMPLANT
TRAY FOLEY SLVR 16FR TEMP STAT (SET/KITS/TRAYS/PACK) ×1 IMPLANT
TROCAR BLADELESS 11MM (ENDOMECHANICALS) ×1 IMPLANT
TROCAR XCEL BLUNT TIP 100MML (ENDOMECHANICALS) ×2 IMPLANT
TROCAR XCEL NON-BLD 5MMX100MML (ENDOMECHANICALS) ×4 IMPLANT
TUBING EXTENTION W/L.L. (IV SETS) ×1 IMPLANT
TUBING LAP HI FLOW INSUFFLATIO (TUBING) ×3 IMPLANT
UNDERPAD 30X30 (UNDERPADS AND DIAPERS) ×2 IMPLANT
WATER STERILE IRR 1000ML POUR (IV SOLUTION) ×4 IMPLANT

## 2019-01-17 NOTE — Transfer of Care (Signed)
Immediate Anesthesia Transfer of Care Note  [name]  Procedure(s) Performed: VIDEO ASSISTED THORACOSCOPY, pericardial window for pericardium and pericardial fluid. (Right Chest)  Patient Location: PACU  Anesthesia Type:General  Level of Consciousness: awake, alert  and oriented  Airway & Oxygen Therapy: Patient Spontanous Breathing and Patient connected to face mask oxygen  Post-op Assessment: Report given to RN, Post -op Vital signs reviewed and stable and Patient moving all extremities X 4  Post vital signs: Reviewed and stable  Last Vitals:  Vitals Value Taken Time  BP    Temp    Pulse 77 01/17/19 1219  Resp 15 01/17/19 1219  SpO2 100 % 01/17/19 1219  Vitals shown include unvalidated device data.  Last Pain:  Vitals:   01/17/19 0800  TempSrc:   PainSc: 0-No pain      Patients Stated Pain Goal: 0 (99/35/70 1779)  Complications: No apparent anesthesia complications

## 2019-01-17 NOTE — Anesthesia Postprocedure Evaluation (Signed)
Anesthesia Post Note  [name]  Procedure(s) Performed: VIDEO ASSISTED THORACOSCOPY, pericardial window for pericardium and pericardial fluid. (Right Chest)     Patient location during evaluation: PACU Anesthesia Type: General Level of consciousness: awake and alert Pain management: pain level controlled Vital Signs Assessment: post-procedure vital signs reviewed and stable Respiratory status: spontaneous breathing, nonlabored ventilation, respiratory function stable and patient connected to nasal cannula oxygen Cardiovascular status: blood pressure returned to baseline and stable Postop Assessment: no apparent nausea or vomiting Anesthetic complications: no    Last Vitals:  Vitals:   01/17/19 1301 01/17/19 1400  BP: 137/64 (!) 144/78  Pulse: 74 75  Resp:  12  Temp:    SpO2:  97%    Last Pain:  Vitals:   01/17/19 1233  TempSrc:   PainSc: 0-No pain                 Tiajuana Amass

## 2019-01-17 NOTE — Anesthesia Procedure Notes (Signed)
Procedure Name: Intubation Date/Time: 01/17/2019 10:52 AM Performed by: Neldon Newport, CRNA Pre-anesthesia Checklist: Timeout performed, Patient being monitored, Suction available, Emergency Drugs available and Patient identified Patient Re-evaluated:Patient Re-evaluated prior to induction Oxygen Delivery Method: Circle system utilized Preoxygenation: Pre-oxygenation with 100% oxygen Induction Type: IV induction and Rapid sequence Ventilation: Mask ventilation without difficulty and Oral airway inserted - appropriate to patient size Laryngoscope Size: Mac, 4 and Glidescope Grade View: Grade III Tube type: Oral Endobronchial tube: Left and 39 Fr Tube size: 8.0 mm Number of attempts: 4 Placement Confirmation: ETT inserted through vocal cords under direct vision,  positive ETCO2 and breath sounds checked- equal and bilateral Secured at: 23 cm Tube secured with: Tape Dental Injury: Teeth and Oropharynx as per pre-operative assessment  Difficulty Due To: Difficulty was unanticipated Future Recommendations: Recommend- induction with short-acting agent, and alternative techniques readily available Comments: Anterior anatomy.  Unable to place double lumen tube.  Dr. Deatra Canter placed ETT using glide scope.

## 2019-01-17 NOTE — Progress Notes (Signed)
100 mcg of Fentanyl and 2 mg Versed given to [place] that I pulled.

## 2019-01-17 NOTE — Anesthesia Procedure Notes (Signed)
Central Venous Catheter Insertion Performed by: Suzette Battiest, MD, anesthesiologist Start/End12/22/2020 9:42 AM, 01/17/2019 9:52 AM Patient location: Pre-op. Preanesthetic checklist: patient identified, IV checked, site marked, risks and benefits discussed, surgical consent, monitors and equipment checked, pre-op evaluation, timeout performed and anesthesia consent Position: Trendelenburg Lidocaine 1% used for infiltration and patient sedated Hand hygiene performed , maximum sterile barriers used  and Seldinger technique used Catheter size: 8 Fr Total catheter length 16. Central line was placed.Double lumen Procedure performed using ultrasound guided technique. Ultrasound Notes:anatomy identified, needle tip was noted to be adjacent to the nerve/plexus identified, no ultrasound evidence of intravascular and/or intraneural injection and image(s) printed for medical record Attempts: 1 Following insertion, dressing applied, line sutured and Biopatch. Post procedure assessment: blood return through all ports  Patient tolerated the procedure well with no immediate complications.

## 2019-01-17 NOTE — Anesthesia Procedure Notes (Signed)
Arterial Line Insertion Start/End12/22/2020 10:00 AM, 01/17/2019 10:15 AM Performed by: Neldon Newport, CRNA, CRNA  Patient location: Pre-op. Preanesthetic checklist: patient identified, IV checked, site marked, risks and benefits discussed, surgical consent, monitors and equipment checked, pre-op evaluation and timeout performed Lidocaine 1% used for infiltration and patient sedated Left, radial was placed Catheter size: 20 G Hand hygiene performed  and maximum sterile barriers used   Attempts: 1 Procedure performed without using ultrasound guided technique. Following insertion, dressing applied and Biopatch. Post procedure assessment: normal and unchanged  Patient tolerated the procedure well with no immediate complications.

## 2019-01-17 NOTE — Progress Notes (Signed)
Patient ID: Samuel Reed, male   DOB: 1960/10/11, 58 y.o.   MRN: [id number] TCTS Evening Rounds:  Hemodynamically stable   Chest tube output low.

## 2019-01-17 NOTE — Progress Notes (Signed)
Progress Note  Patient Name: Samuel Reed Date of Encounter: 01/17/2019  Primary Cardiologist: Dr. Quay Burow  Subjective   No complaints this morning.  Denies chest pain or shortness of breath.  Hemodynamically stable.  Scheduled for subxiphoid pericardial window today by Dr. Kipp Brood.  Inpatient Medications    Scheduled Meds: . albuterol  5 mg Nebulization Once  . calcitRIOL  0.5 mcg Oral Daily  . Chlorhexidine Gluconate Cloth  6 each Topical Daily  . cholecalciferol  2,000 Units Oral Q breakfast  . doxazosin  4 mg Oral QHS  . ferrous sulfate  650 mg Oral Q breakfast  . hydrALAZINE  100 mg Oral Q8H  . insulin aspart  0-20 Units Subcutaneous TID WC  . insulin aspart  0-5 Units Subcutaneous QHS  . insulin glargine  35 Units Subcutaneous QHS  . metoprolol tartrate  100 mg Oral BID  . rosuvastatin  20 mg Oral Daily  . sodium chloride flush  3 mL Intravenous Q12H  . sodium chloride flush  3 mL Intravenous Q12H  . torsemide  40 mg Oral Daily   Continuous Infusions: . sodium chloride    . sodium chloride 50 mL/hr at 01/17/19 0720   PRN Meds: sodium chloride, acetaminophen, ondansetron (ZOFRAN) IV, sodium chloride flush   Vital Signs    Vitals:   01/17/19 0400 01/17/19 0500 01/17/19 0600 01/17/19 0700  BP: (!) 141/69 132/71 129/66 125/64  Pulse: 69 66 66 72  Resp: 10 17 16  (!) 8  Temp: 98 F (36.7 C)   98.4 F (36.9 C)  TempSrc: Axillary   Oral  SpO2: 97% 96% 96% 97%  Weight:   124.7 kg     Intake/Output Summary (Last 24 hours) at 01/17/2019 0831 Last data filed at 01/17/2019 0700 Gross per 24 hour  Intake 550 ml  Output 1653 ml  Net -1103 ml   Last 3 Weights 01/17/2019 01/13/2019 08/19/2018  Weight (lbs) 274 lb 14.6 oz 291 lb 9.6 oz 260 lb  Weight (kg) 124.7 kg 132.269 kg 117.935 kg      Telemetry    Not on telemetry- Personally Reviewed  ECG    None today- Personally Reviewed  Physical Exam   GEN: No acute distress.   Neck: No  JVD Cardiac: RRR, no murmurs, rubs, or gallops.  Respiratory: Clear to auscultation bilaterally. GI: Soft, nontender, non-distended  MS: No edema; No deformity. Neuro:  Nonfocal  Psych: Normal affect   Labs    High Sensitivity Troponin:   Recent Labs  Lab 01/14/19 2211 01/15/19 0128 01/16/19 1825  TROPONINIHS 26* 30* 214*      Chemistry Recent Labs  Lab 01/14/19 2211 01/16/19 0406 01/16/19 1825 01/17/19 0214  NA 143 145 141 143  K 3.9 3.6 3.8 3.7  CL 109 110 107 108  CO2 21* 23 21* 23  GLUCOSE 179* 191* 257* 246*  BUN 88* 75* 72* 69*  CREATININE 4.73* 3.77* 3.40* 3.09*  CALCIUM 9.1 9.0 8.8* 8.8*  PROT 7.1  --  6.7  --   ALBUMIN 3.8  --  3.7  --   AST 24  --  20  --   ALT 26  --  22  --   ALKPHOS 106  --  73  --   BILITOT 0.6  --  1.3*  --   GFRNONAA 13* 17* 19* 21*  GFRAA 15* 19* 22* 25*  ANIONGAP 13 12 13 12      Hematology Recent Labs  Lab 01/14/19  2211 01/15/19 0128 01/16/19 1825  WBC 8.9 8.7 8.5  RBC 4.10* 4.21* 4.14*  HGB 12.0* 12.1* 11.9*  HCT 36.5* 37.5* 36.3*  MCV 89.0 89.1 87.7  MCH 29.3 28.7 28.7  MCHC 32.9 32.3 32.8  RDW 13.8 13.9 13.7  PLT 160 169 150    BNP Recent Labs  Lab 01/14/19 2211  BNP 273.3*     DDimer No results for input(s): DDIMER in the last 168 hours.   Radiology    CARDIAC CATHETERIZATION  Result Date: 01/16/2019 Aborted unsuccessful attempt at pericardiocentesis via the subxiphoid approach. The patient will be scheduled to undergo pericardial window tomorrow with cardiothoracic surgery.  ECHOCARDIOGRAM LIMITED  Result Date: 01/16/2019   ECHOCARDIOGRAM REPORT   Patient Name:   Samuel Reed [facility] Date of Exam: 01/16/2019 Medical Rec #:  [id number]       Height:       71.0 in Accession #:    [id number]      Weight:       291.6 lb Date of Birth:  03-Aug-1960       BSA:          2.48 m Patient Age:    2 years        BP:           134/62 mmHg Patient Gender: M               HR:           80 bpm. Exam Location:   Inpatient Procedure: Limited Color Doppler Indications:    Pericardial effusion 423.9/I31.3  History:        Patient has prior history of Echocardiogram examinations, most                 recent 01/15/2019. Risk Factors:Hypertension, Dyslipidemia and                 Sleep Apnea. Chronic kidney disease. chronic venous                 insufficiency.  Sonographer:    Darlina Sicilian RDCS Referring Phys: [place]  1. Left ventricular ejection fraction, by visual estimation, is 55 to 60%. The left ventricle has normal function. There is no left ventricular hypertrophy.  2. Global right ventricle was not well visualized.The right ventricular size is not well visualized. No increase in right ventricular wall thickness.  3. Left atrial size was not well visualized.  4. Right atrial size was not well visualized.  5. Large pericardial effusion.  6. The pericardial effusion is posterior to the left ventricle and the left atrium and lateral to the left ventricle.  7. The mitral valve was not well visualized. not assessed mitral valve regurgitation. No evidence of mitral stenosis.  8. The tricuspid valve is not well visualized. Tricuspid valve regurgitation not assessed.  9. The aortic valve was not well visualized. Aortic valve regurgitation is not visualized. No evidence of aortic valve sclerosis or stenosis. 10. The pulmonic valve was not well visualized. Pulmonic valve regurgitation is not visualized. 11. The interatrial septum was not assessed. In comparison to the previous echocardiogram(s): Pericardial effusion remains. Do not see any clear evidence of tamponade (RV or RA collapse). These structures are not well visualized. FINDINGS  Left Ventricle: Left ventricular ejection fraction, by visual estimation, is 55 to 60%. The left ventricle has normal function. The left ventricle is not well visualized. There is no left ventricular hypertrophy. Normal left atrial pressure.  Right Ventricle: The right  ventricular size is not well visualized. No increase in right ventricular wall thickness. Global RV systolic function is was not well visualized. Left Atrium: Left atrial size was not well visualized. Right Atrium: Right atrial size was not well visualized Pericardium: A large pericardial effusion is present. The pericardial effusion is posterior to the left ventricle and the left atrium and lateral to the left ventricle. Mitral Valve: The mitral valve was not well visualized. Not assessed mitral valve regurgitation. No evidence of mitral valve stenosis by observation. Tricuspid Valve: The tricuspid valve is not well visualized. Tricuspid valve regurgitation not assessed. Aortic Valve: The aortic valve was not well visualized. Aortic valve regurgitation is not visualized. The aortic valve is structurally normal, with no evidence of sclerosis or stenosis. Pulmonic Valve: The pulmonic valve was not well visualized. Pulmonic valve regurgitation is not visualized. Pulmonic regurgitation is not visualized. Aorta: The aortic root, ascending aorta and aortic arch are all structurally normal, with no evidence of dilitation or obstruction. Venous: The inferior vena cava was not well visualized. IAS/Shunts: The interatrial septum was not assessed. There is no evidence of a patent foramen ovale. No ventricular septal defect is seen or detected. There is no evidence of an atrial septal defect.  Candee Furbish MD Electronically signed by Candee Furbish MD Signature Date/Time: 01/16/2019/5:16:44 PM    Final    ECHOCARDIOGRAM LIMITED  Result Date: 01/15/2019   ECHOCARDIOGRAM LIMITED REPORT   Patient Name:   RASHUN GRATTAN [facility] Date of Exam: 01/15/2019 Medical Rec #:  [id number]       Height:       71.0 in Accession #:    [id number]      Weight:       291.6 lb Date of Birth:  Aug 16, 1960       BSA:          2.48 m Patient Age:    89 years        BP:           159/72 mmHg Patient Gender: M               HR:           76 bpm. Exam Location:   Inpatient  Procedure: Limited Echo, Limited Color Doppler and Cardiac Doppler Indications:    pericardial effusion 423.9  History:        Patient has prior history of Echocardiogram examinations, most                 recent 01/13/2019.  Sonographer:    Johny Chess Referring Phys: [id number] [place]  1. Left ventricular ejection fraction, by visual estimation, is 60 to 65%. The left ventricle has normal function. There is moderately increased left ventricular hypertrophy.  2. Left ventricular diastolic parameters are consistent with Grade II diastolic dysfunction (pseudonormalization).  3. Global right ventricle has normal systolic function.The right ventricular size is normal. No increase in right ventricular wall thickness.  4. No clear RV diastolic collapse on parasternal or short axis views. Apical views do not entirely show the RV free wall for interpretation.  5. Left atrial size was severely dilated.  6. Large pericardial effusion.  7. The pericardial effusion is circumferential.  8. The mitral valve is normal in structure. Trivial mitral valve regurgitation. No evidence of mitral stenosis.  9. The tricuspid valve is normal in structure. Tricuspid valve regurgitation is trivial. 10. The tricuspid valve was normal in structure. Tricuspid valve  regurgitation is trivial. 11. The aortic valve is tricuspid. Aortic valve regurgitation is not visualized. No evidence of aortic valve sclerosis or stenosis. 12. The inferior vena cava is dilated in size with <50% respiratory variability, suggesting right atrial pressure of 15 mmHg. 13. Echo without evidence of tamponade. Large circumferential pericardial effusion appears similar to study from 2 days ago. No significant inflow velocity variation, no clear RV collapse. IVC is somewhat dilated and cannot be completely seen with respiratory change, but overall picture does not suggest tamponade. Recommend clinical correlation. 14. The interatrial  septum was not assessed. FINDINGS  Left Ventricle: Left ventricular ejection fraction, by visual estimation, is 60 to 65%. There is moderately increased left ventricular wall thickness. Concentric left ventricular hypertrophy. Left ventricular diastolic parameters are consistent with Grade II diastolic dysfunction (pseudonormalization). Right Ventricle: The right ventricular size is normal. No increase in right ventricular wall thickness. Global RV systolic function is has normal systolic function. No clear RV diastolic collapse on parasternal or short axis views. Apical views do not entirely show the RV free wall for interpretation. Left Atrium: Left atrial size was severely dilated. Pericardium: A large pericardial effusion is present is seen. A large pericardial effusion is present. The pericardial effusion is circumferential. Largest area of effusion appears to be inferior and posterior. Mitral Valve: The mitral valve is normal in structure. No evidence of mitral valve stenosis by observation. MV Area by PHT, 3.72 cm. MV PHT, 59.16 msec. Trivial mitral valve regurgitation. Tricuspid Valve: The tricuspid valve is normal in structure. Tricuspid valve regurgitation is trivial. Aortic Valve: The aortic valve is tricuspid. Aortic valve regurgitation is not visualized. The aortic valve is structurally normal, with no evidence of sclerosis or stenosis. Pulmonic Valve: The pulmonic valve was grossly normal. Pulmonic valve regurgitation is not visualized by color flow Doppler. Pulmonic regurgitation is not visualized by color flow Doppler. Venous: The inferior vena cava is dilated in size with less than 50% respiratory variability, suggesting right atrial pressure of 15 mmHg. Shunts: The interatrial septum was not assessed.  LEFT VENTRICLE          Normals PLAX 2D LVIDd:         5.00 cm  3.6 cm   Diastology                 Normals LVIDs:         3.20 cm  1.7 cm   LV e' lateral:   9.36 cm/s 6.42 cm/s LV PW:         1.40  cm  1.4 cm   LV E/e' lateral: 13.6      15.4 LV IVS:        1.40 cm  1.3 cm   LV e' medial:    5.55 cm/s 6.96 cm/s LVOT diam:     2.50 cm  2.0 cm   LV E/e' medial:  22.9      6.96 LV SV:         77 ml    79 ml LV SV Index:   29.37    45 ml/m2 LVOT Area:     4.91 cm 3.14 cm2  LEFT ATRIUM         Index LA diam:    5.00 cm 2.02 cm/m  AORTIC VALVE             Normals LVOT Vmax:   111.00 cm/s LVOT Vmean:  79.700 cm/s 75 cm/s LVOT VTI:    0.263 m  25.3 cm  AORTA                 Normals Ao Root diam: 3.60 cm 31 mm MITRAL VALVE              Normals MV Area (PHT): 3.72 cm              SHUNTS MV PHT:        59.16 msec 55 ms      Systemic VTI:  0.26 m MV Decel Time: 204 msec   187 ms     Systemic Diam: 2.50 cm MV E velocity: 127.00 cm/s 103 cm/s MV A velocity: 65.10 cm/s  70.3 cm/s MV E/A ratio:  1.95        1.5  Buford Dresser MD Electronically signed by Buford Dresser MD Signature Date/Time: 01/15/2019/12:49:17 PMThe mitral valve is normal in structure.    Final     Cardiac Studies   2D echocardiogram (01/16/2019)  IMPRESSIONS    1. Left ventricular ejection fraction, by visual estimation, is 55 to 60%. The left ventricle has normal function. There is no left ventricular hypertrophy.  2. Global right ventricle was not well visualized.The right ventricular size is not well visualized. No increase in right ventricular wall thickness.  3. Left atrial size was not well visualized.  4. Right atrial size was not well visualized.  5. Large pericardial effusion.  6. The pericardial effusion is posterior to the left ventricle and the left atrium and lateral to the left ventricle.  7. The mitral valve was not well visualized. not assessed mitral valve regurgitation. No evidence of mitral stenosis.  8. The tricuspid valve is not well visualized. Tricuspid valve regurgitation not assessed.  9. The aortic valve was not well visualized. Aortic valve regurgitation is not visualized. No evidence of  aortic valve sclerosis or stenosis. 10. The pulmonic valve was not well visualized. Pulmonic valve regurgitation is not visualized. 11. The interatrial septum was not assessed.  In comparison to the previous echocardiogram(s): Pericardial effusion remains. Do not see any clear evidence of tamponade (RV or RA collapse). These structures are not well visualized. FINDINGS  Left Ventricle: Left ventricular ejection fraction, by visual estimation, is 55 to 60%. The left ventricle has normal function. The left ventricle is not well visualized. There is no left ventricular hypertrophy. Normal left atrial pressure.  Patient Profile     58 y.o. Caucasian male initially referred to me by [name], his nephrologist for evaluation of pericardial effusion with features of pericardial tamponade.  His risk factors include hypertension, diabetes and hyperlipidemia.  He does have obstructive sleep apnea.  He has chronic renal insufficiency with serum creatinines in the mid 3 range presumably related to hypertension diabetes.  He had a normal cardiac catheterization performed by Dr. Einar Gip remotely.  2D echo performed 11/28/2018 revealed normal LV systolic function with severe concentric LVH and large preferential pericardial effusion with features of tamponade although he was asymptomatic from this.  He was admitted over the weekend with increasing shortness of breath by Dr. Harrell Gave who arrange for him to undergo pericardiocentesis yesterday.  Assessment & Plan    1: Pericardial effusion-unsuccessful attempted subxiphoid pericardiocentesis due to location of the pericardial fluid in the lateral/posterior lateral area with very little 8 apical fluid.  He scheduled for subxiphoid window today with Dr. Kipp Brood.  The etiology of his pericardial effusion is still unclear.  It is possible this is related to prior COVID-19 although chronic renal insufficiency,  idiopathic and malignant are also possibilities.  2:  Essential hypertension-blood pressure today is 136/66.  He is on lisinopril, metoprolol and hydralazine as an outpatient  For questions or updates, please contact [facility] Please consult [email or web address] for contact info under        Signed, Quay Burow, MD  01/17/2019, 8:31 AM

## 2019-01-17 NOTE — Progress Notes (Signed)
     [address]       [place] [address]             [phone]       No events  Vitals:   01/17/19 0800 01/17/19 0900  BP: 136/66 130/72  Pulse: 74 94  Resp: (!) 0 19  Temp:    SpO2: 98% 97%    alert NAD RRR EWOB  58 yo male with pericardial effusion OR today for R VATS, pericardial window.  Alyzah Pelly Bary Leriche

## 2019-01-17 NOTE — Progress Notes (Signed)
Pt wears cpap at home, RT set up pt home cpap unit. Pt stated he can place himself on when ready for bed.

## 2019-01-17 NOTE — Progress Notes (Signed)
Patient initially tested 12/12/2018. [name] noted on 01/13/2019 that patient was asymptomatic. Per CDC and [place] guidelines, since it's been over 21 days since first positive and patient recovered, no isolation for this patient is required. Standard precautions are sufficient and no retesting is recommended following 90 days from first positive test.

## 2019-01-17 NOTE — Brief Op Note (Signed)
01/14/2019 - 01/17/2019  11:46 AM  PATIENT:  Samuel Reed  58 y.o. male  PRE-OPERATIVE DIAGNOSIS:  pericardial effusion  POST-OPERATIVE DIAGNOSIS:  * No post-op diagnosis entered *  PROCEDURE:  Procedure(s): VIDEO ASSISTED THORACOSCOPY, pericardial window double lumen endotracheal tube lazy lateral decubitus (Right)  SURGEON:  Surgeon(s) and Role:    * Lightfoot, Lucile Crater, MD - Primary  PHYSICIAN ASSISTANT: Aryia Delira PA-C  ANESTHESIA:   general  EBL:  MINIMAL  BLOOD ADMINISTERED:none  DRAINS: (1 62F) Blake drain(s) in the PERICARDIUM   LOCAL MEDICATIONS USED:  OTHER EXPAREL  SPECIMEN:  Source of Specimen:  PERICARDIAL TISSUE/FLUID  DISPOSITION OF SPECIMEN:  PATH/MICRO  COUNTS:  YES  TOURNIQUET:  * No tourniquets in log *  DICTATION: .Dragon Dictation  PLAN OF CARE: Admit to inpatient   PATIENT DISPOSITION:  PACU - hemodynamically stable.   Delay start of Pharmacological VTE agent (>24hrs) due to surgical blood loss or risk of bleeding: yes  COMPLICATIONS: NO KNOWN

## 2019-01-17 NOTE — Anesthesia Preprocedure Evaluation (Signed)
Anesthesia Evaluation  Patient identified by MRN, date of birth, ID band Patient awake    Reviewed: Allergy & Precautions, NPO status , Patient's Chart, lab work & pertinent test results  Airway Mallampati: II  TM Distance: >3 FB     Dental  (+) Dental Advisory Given   Pulmonary sleep apnea , former smoker,    breath sounds clear to auscultation       Cardiovascular hypertension,  Rhythm:Regular Rate:Normal  Pericardial effusion with tamponade   Neuro/Psych  Neuromuscular disease    GI/Hepatic negative GI ROS, Neg liver ROS,   Endo/Other  diabetes, Type 2  Renal/GU CRFRenal disease     Musculoskeletal   Abdominal   Peds  Hematology  (+) anemia ,   Anesthesia Other Findings   Reproductive/Obstetrics                             Lab Results  Component Value Date   WBC 8.5 01/16/2019   HGB 11.9 (L) 01/16/2019   HCT 36.3 (L) 01/16/2019   MCV 87.7 01/16/2019   PLT 150 01/16/2019   Lab Results  Component Value Date   CREATININE 3.09 (H) 01/17/2019   BUN 69 (H) 01/17/2019   NA 143 01/17/2019   K 3.7 01/17/2019   CL 108 01/17/2019   CO2 23 01/17/2019    Anesthesia Physical Anesthesia Plan  ASA: IV  Anesthesia Plan: General   Post-op Pain Management:    Induction: Intravenous  PONV Risk Score and Plan: 2 and Dexamethasone, Ondansetron and Treatment may vary due to age or medical condition  Airway Management Planned: Double Lumen EBT  Additional Equipment: Arterial line and CVP  Intra-op Plan:   Post-operative Plan: Extubation in OR and Possible Post-op intubation/ventilation  Informed Consent: I have reviewed the patients History and Physical, chart, labs and discussed the procedure including the risks, benefits and alternatives for the proposed anesthesia with the patient or authorized representative who has indicated his/her understanding and acceptance.     Dental  advisory given  Plan Discussed with: CRNA  Anesthesia Plan Comments:         Anesthesia Quick Evaluation

## 2019-01-17 NOTE — Op Note (Signed)
      [address]       [place] [address]             [phone]        01/17/2019  Patient:  Samuel Reed Pre-Op Dx: Pericardial effusion   History COVID-19 infection Post-op Dx: Same Procedure: Right thoracoscopy Pericardial window Drainage of pericardial effusion  Surgeon and Role:      * Baylin Cabal, Lucile Crater, MD - Primary    Evonnie Pat, PA-C- assisting  Anesthesia  general EBL: Minimal  Blood Administration: None Specimen: Pericardial effusion, pericardial  Drains: 19 F argyle chest tube in right chest Counts: correct   Indications: 58 year old admitted to the hospital with worsening dyspnea on exertion.  He underwent an echocardiogram which showed a large circumferential pericardial.  Pericardiocentesis was attempted but not successful.  Surgery was consulted for pericardial window.  Of note, he has a history of a COVID-19 infection 6 weeks ago.  Findings: 766ml of bloody pericardial fluid.  A large clot was evacuated from the right atrial surface.  Pericardium appeared normal.  Operative Technique: After the risks, benefits and alternatives were thoroughly discussed, the patient was brought to the operative theatre.  Anesthesia was induced, and the patient was then placed in a lazy lateral decubitus position and was prepped and draped in normal sterile fashion.  An appropriate surgical pause was performed, and pre-operative antibiotics were dosed accordingly.  We began with 1cm incision in the anterior axillary line at the 4th intercostal space.  A 5 mm trocar was introduced in the space.  The chest was entered, and we then placed a 1cm incision at the 8th intercostal space, and introduced our camera port.  The thoracic cavity was visualized.  The pericardium was evident full of fluid.  Another 1 cm incision was made third intercostal space and a 5 mm trocar was introduced.  Using Bovie cautery, a 3 x 3 cm pericardial window was created with  immediate release of bloody fluid.  There was a large clot in the atrial surface that was evacuated.  The fluid was collected and sent off for specimen, along with the pericardial window.  A 19 [place] Blake was introduced through the 11 mm trocar and passed to the pericardium.  The trochars were removed and the skin and soft tissue were closed with absorbable suture    The patient tolerated the procedure without any immediate complications, and was transferred to the PACU in stable condition.  Chrstopher Malenfant Bary Leriche

## 2019-01-18 ENCOUNTER — Inpatient Hospital Stay (HOSPITAL_COMMUNITY): Payer: 59

## 2019-01-18 DIAGNOSIS — E782 Mixed hyperlipidemia: Secondary | ICD-10-CM

## 2019-01-18 DIAGNOSIS — Z9889 Other specified postprocedural states: Secondary | ICD-10-CM

## 2019-01-18 DIAGNOSIS — I10 Essential (primary) hypertension: Secondary | ICD-10-CM

## 2019-01-18 LAB — CBC
HCT: 33.4 % — ABNORMAL LOW (ref 39.0–52.0)
Hemoglobin: 10.8 g/dL — ABNORMAL LOW (ref 13.0–17.0)
MCH: 29.1 pg (ref 26.0–34.0)
MCHC: 32.3 g/dL (ref 30.0–36.0)
MCV: 90 fL (ref 80.0–100.0)
Platelets: 139 10*3/uL — ABNORMAL LOW (ref 150–400)
RBC: 3.71 MIL/uL — ABNORMAL LOW (ref 4.22–5.81)
RDW: 13.8 % (ref 11.5–15.5)
WBC: 11.9 10*3/uL — ABNORMAL HIGH (ref 4.0–10.5)
nRBC: 0 % (ref 0.0–0.2)

## 2019-01-18 LAB — ACID FAST SMEAR (AFB, MYCOBACTERIA): Acid Fast Smear: NEGATIVE

## 2019-01-18 LAB — BASIC METABOLIC PANEL
Anion gap: 11 (ref 5–15)
BUN: 66 mg/dL — ABNORMAL HIGH (ref 6–20)
CO2: 21 mmol/L — ABNORMAL LOW (ref 22–32)
Calcium: 8.4 mg/dL — ABNORMAL LOW (ref 8.9–10.3)
Chloride: 105 mmol/L (ref 98–111)
Creatinine, Ser: 3.12 mg/dL — ABNORMAL HIGH (ref 0.61–1.24)
GFR calc Af Amer: 24 mL/min — ABNORMAL LOW (ref >60)
GFR calc non Af Amer: 21 mL/min — ABNORMAL LOW (ref >60)
Glucose, Bld: 367 mg/dL — ABNORMAL HIGH (ref 70–99)
Potassium: 3.9 mmol/L (ref 3.5–5.1)
Sodium: 137 mmol/L (ref 135–145)

## 2019-01-18 LAB — POCT I-STAT 7, (LYTES, BLD GAS, ICA,H+H)
Acid-base deficit: 3 mmol/L — ABNORMAL HIGH (ref 0.0–2.0)
Bicarbonate: 22.6 mmol/L (ref 20.0–28.0)
Calcium, Ion: 1.21 mmol/L (ref 1.15–1.40)
HCT: 31 % — ABNORMAL LOW (ref 39.0–52.0)
Hemoglobin: 10.5 g/dL — ABNORMAL LOW (ref 13.0–17.0)
O2 Saturation: 95 %
Patient temperature: 98.6
Potassium: 3.8 mmol/L (ref 3.5–5.1)
Sodium: 140 mmol/L (ref 135–145)
TCO2: 24 mmol/L (ref 22–32)
pCO2 arterial: 40.6 mmHg (ref 32.0–48.0)
pH, Arterial: 7.353 (ref 7.350–7.450)
pO2, Arterial: 77 mmHg — ABNORMAL LOW (ref 83.0–108.0)

## 2019-01-18 LAB — GLUCOSE, CAPILLARY
Glucose-Capillary: 329 mg/dL — ABNORMAL HIGH (ref 70–99)
Glucose-Capillary: 270 mg/dL — ABNORMAL HIGH (ref 70–99)
Glucose-Capillary: 183 mg/dL — ABNORMAL HIGH (ref 70–99)
Glucose-Capillary: 262 mg/dL — ABNORMAL HIGH (ref 70–99)

## 2019-01-18 LAB — SURGICAL PATHOLOGY

## 2019-01-18 LAB — CYTOLOGY - NON PAP

## 2019-01-18 NOTE — Progress Notes (Signed)
Inpatient Diabetes Program Recommendations  AACE/ADA: New Consensus Statement on Inpatient Glycemic Control (2015)  Target Ranges:  Prepandial:   less than 140 mg/dL      Peak postprandial:   less than 180 mg/dL (1-2 hours)      Critically ill patients:  140 - 180 mg/dL   Lab Results  Component Value Date   GLUCAP 270 (H) 01/18/2019   HGBA1C 9.7 (H) 01/16/2019  Results for SAQIB, CAZAREZ (MRN [id number]) as of 01/18/2019 11:21  Ref. Range 01/17/2019 12:19 01/17/2019 15:38 01/17/2019 21:19 01/18/2019 09:28 01/18/2019 11:14  Glucose-Capillary Latest Ref Range: 70 - 99 mg/dL 185 (H) 225 (H) 291 (H) 329 (H) 270 (H)    Review of Glycemic Control  Diabetes history: Type 2? Outpatient Diabetes medications: Basalglar 65 U at bedtime, Novolog 10-20 U TID Current orders for Inpatient glycemic control: Levemir 35 U at HS, Novolog RESISTANT correction scale TID   Inpatient Diabetes Program Recommendations:   Noted that blood sugars have been greater than 180 mg/dl. Recommend increasing Levemir to 42 units at HS (+20% of current dose) and add Novolog 4 units TID with meals if eating at least 50% of meal.   Harvel Ricks RN BSN CDE Diabetes Coordinator Pager: [phone]  8am-5pm

## 2019-01-18 NOTE — Progress Notes (Signed)
Progress Note  Patient Name: Samuel Reed Date of Encounter: 01/18/2019  Primary Cardiologist: Dr. Quay Burow  Subjective   No complaints this morning.  Denies chest pain or shortness of breath.  Hemodynamically stable.  Status post subxiphoid pericardial window and pericardial drainage by Dr. Kipp Brood yesterday removing 750 cc of bloody fluid.  The patient feels clinically improved.  Inpatient Medications    Scheduled Meds: . acetaminophen  1,000 mg Oral Q6H   Or  . acetaminophen (TYLENOL) oral liquid 160 mg/5 mL  1,000 mg Oral Q6H  . albuterol  5 mg Nebulization Once  . bisacodyl  10 mg Oral Daily  . calcitRIOL  0.5 mcg Oral Daily  . Chlorhexidine Gluconate Cloth  6 each Topical Daily  . cholecalciferol  2,000 Units Oral Q breakfast  . doxazosin  4 mg Oral QHS  . enoxaparin (LOVENOX) injection  40 mg Subcutaneous Daily  . ferrous sulfate  650 mg Oral Q breakfast  . hydrALAZINE  100 mg Oral Q8H  . insulin aspart  0-20 Units Subcutaneous TID WC  . insulin aspart  0-5 Units Subcutaneous QHS  . insulin glargine  35 Units Subcutaneous QHS  . metoprolol tartrate  100 mg Oral BID  . rosuvastatin  20 mg Oral Daily  . senna-docusate  1 tablet Oral QHS   Continuous Infusions: . sodium chloride Stopped (01/17/19 2255)   PRN Meds: Place/Maintain arterial line **AND** sodium chloride, fentaNYL (SUBLIMAZE) injection, ondansetron (ZOFRAN) IV, oxyCODONE, traMADol   Vital Signs    Vitals:   01/18/19 0700 01/18/19 0800 01/18/19 0805 01/18/19 0813  BP: (!) 134/58  (!) 158/69   Pulse: 74 73 75 75  Resp: 18 15 20 17   Temp:      TempSrc:      SpO2: 97% 99% 97% 99%  Weight:        Intake/Output Summary (Last 24 hours) at 01/18/2019 0814 Last data filed at 01/18/2019 0410 Gross per 24 hour  Intake 2362.35 ml  Output 1695 ml  Net 667.35 ml   Last 3 Weights 01/17/2019 01/13/2019 08/19/2018  Weight (lbs) 274 lb 14.6 oz 291 lb 9.6 oz 260 lb  Weight (kg) 124.7 kg 132.269  kg 117.935 kg      Telemetry    Normal sinus rhythm- Personally Reviewed  ECG   None today- Personally Reviewed  Physical Exam   GEN: No acute distress.   Neck: No JVD, still has a central line in place Cardiac: RRR, no murmurs, rubs, or gallops.  Respiratory: Clear to auscultation bilaterally. GI: Soft, nontender, non-distended  MS: No edema; No deformity. Neuro:  Nonfocal  Psych: Normal affect  Extremities: Left radial A-line remains in place  Labs    High Sensitivity Troponin:   Recent Labs  Lab 01/14/19 2211 01/15/19 0128 01/16/19 1825  TROPONINIHS 26* 30* 214*      Chemistry Recent Labs  Lab 01/14/19 2211 01/16/19 0406 01/16/19 1825 01/17/19 0214 01/18/19 0426  NA 143 145 141 143 140  K 3.9 3.6 3.8 3.7 3.8  CL 109 110 107 108  --   CO2 21* 23 21* 23  --   GLUCOSE 179* 191* 257* 246*  --   BUN 88* 75* 72* 69*  --   CREATININE 4.73* 3.77* 3.40* 3.09*  --   CALCIUM 9.1 9.0 8.8* 8.8*  --   PROT 7.1  --  6.7  --   --   ALBUMIN 3.8  --  3.7  --   --  AST 24  --  20  --   --   ALT 26  --  22  --   --   ALKPHOS 106  --  73  --   --   BILITOT 0.6  --  1.3*  --   --   GFRNONAA 13* 17* 19* 21*  --   GFRAA 15* 19* 22* 25*  --   ANIONGAP 13 12 13 12   --      Hematology Recent Labs  Lab 01/14/19 2211 01/15/19 0128 01/16/19 1825 01/18/19 0426  WBC 8.9 8.7 8.5  --   RBC 4.10* 4.21* 4.14*  --   HGB 12.0* 12.1* 11.9* 10.5*  HCT 36.5* 37.5* 36.3* 31.0*  MCV 89.0 89.1 87.7  --   MCH 29.3 28.7 28.7  --   MCHC 32.9 32.3 32.8  --   RDW 13.8 13.9 13.7  --   PLT 160 169 150  --     BNP Recent Labs  Lab 01/14/19 2211  BNP 273.3*     DDimer No results for input(s): DDIMER in the last 168 hours.   Radiology    CARDIAC CATHETERIZATION  Result Date: 01/16/2019 Aborted unsuccessful attempt at pericardiocentesis via the subxiphoid approach. The patient will be scheduled to undergo pericardial window tomorrow with cardiothoracic surgery.  DG Chest  Port 1 View  Result Date: 01/17/2019 CLINICAL DATA:  Status post pericardial window creation. EXAM: PORTABLE CHEST 1 VIEW COMPARISON:  01/14/2019. FINDINGS: Right IJ line noted with tip over SVC. Cardiomegaly with pulmonary venous congestion and bilateral interstitial prominence consistent CHF. No pleural effusion or pneumothorax. IMPRESSION: 1.  Right IJ line noted with tip over SVC. 2. Cardiomegaly with pulmonary venous congestion bilateral interstitial prominence consistent with congestive heart failure. Electronically Signed   By: Marcello Moores  Register   On: 01/17/2019 14:17   ECHOCARDIOGRAM LIMITED  Result Date: 01/16/2019   ECHOCARDIOGRAM REPORT   Patient Name:   Samuel Reed [facility] Date of Exam: 01/16/2019 Medical Rec #:  [id number]       Height:       71.0 in Accession #:    [id number]      Weight:       291.6 lb Date of Birth:  [birth year]       BSA:          2.48 m Patient Age:    58 years        BP:           134/62 mmHg Patient Gender: M               HR:           80 bpm. Exam Location:  Inpatient Procedure: Limited Color Doppler Indications:    Pericardial effusion 423.9/I31.3  History:        Patient has prior history of Echocardiogram examinations, most                 recent 01/15/2019. Risk Factors:Hypertension, Dyslipidemia and                 Sleep Apnea. Chronic kidney disease. chronic venous                 insufficiency.  Sonographer:    Darlina Sicilian RDCS Referring Phys: [place]  1. Left ventricular ejection fraction, by visual estimation, is 55 to 60%. The left ventricle has normal function. There is no left ventricular hypertrophy.  2. Global right ventricle was  not well visualized.The right ventricular size is not well visualized. No increase in right ventricular wall thickness.  3. Left atrial size was not well visualized.  4. Right atrial size was not well visualized.  5. Large pericardial effusion.  6. The pericardial effusion is posterior to the left ventricle and  the left atrium and lateral to the left ventricle.  7. The mitral valve was not well visualized. not assessed mitral valve regurgitation. No evidence of mitral stenosis.  8. The tricuspid valve is not well visualized. Tricuspid valve regurgitation not assessed.  9. The aortic valve was not well visualized. Aortic valve regurgitation is not visualized. No evidence of aortic valve sclerosis or stenosis. 10. The pulmonic valve was not well visualized. Pulmonic valve regurgitation is not visualized. 11. The interatrial septum was not assessed. In comparison to the previous echocardiogram(s): Pericardial effusion remains. Do not see any clear evidence of tamponade (RV or RA collapse). These structures are not well visualized. FINDINGS  Left Ventricle: Left ventricular ejection fraction, by visual estimation, is 55 to 60%. The left ventricle has normal function. The left ventricle is not well visualized. There is no left ventricular hypertrophy. Normal left atrial pressure. Right Ventricle: The right ventricular size is not well visualized. No increase in right ventricular wall thickness. Global RV systolic function is was not well visualized. Left Atrium: Left atrial size was not well visualized. Right Atrium: Right atrial size was not well visualized Pericardium: A large pericardial effusion is present. The pericardial effusion is posterior to the left ventricle and the left atrium and lateral to the left ventricle. Mitral Valve: The mitral valve was not well visualized. Not assessed mitral valve regurgitation. No evidence of mitral valve stenosis by observation. Tricuspid Valve: The tricuspid valve is not well visualized. Tricuspid valve regurgitation not assessed. Aortic Valve: The aortic valve was not well visualized. Aortic valve regurgitation is not visualized. The aortic valve is structurally normal, with no evidence of sclerosis or stenosis. Pulmonic Valve: The pulmonic valve was not well visualized. Pulmonic  valve regurgitation is not visualized. Pulmonic regurgitation is not visualized. Aorta: The aortic root, ascending aorta and aortic arch are all structurally normal, with no evidence of dilitation or obstruction. Venous: The inferior vena cava was not well visualized. IAS/Shunts: The interatrial septum was not assessed. There is no evidence of a patent foramen ovale. No ventricular septal defect is seen or detected. There is no evidence of an atrial septal defect.  Candee Furbish MD Electronically signed by Candee Furbish MD Signature Date/Time: 01/16/2019/5:16:44 PM    Final     Cardiac Studies   2D echocardiogram (01/16/2019)  IMPRESSIONS    1. Left ventricular ejection fraction, by visual estimation, is 55 to 60%. The left ventricle has normal function. There is no left ventricular hypertrophy.  2. Global right ventricle was not well visualized.The right ventricular size is not well visualized. No increase in right ventricular wall thickness.  3. Left atrial size was not well visualized.  4. Right atrial size was not well visualized.  5. Large pericardial effusion.  6. The pericardial effusion is posterior to the left ventricle and the left atrium and lateral to the left ventricle.  7. The mitral valve was not well visualized. not assessed mitral valve regurgitation. No evidence of mitral stenosis.  8. The tricuspid valve is not well visualized. Tricuspid valve regurgitation not assessed.  9. The aortic valve was not well visualized. Aortic valve regurgitation is not visualized. No evidence of aortic valve sclerosis  or stenosis. 10. The pulmonic valve was not well visualized. Pulmonic valve regurgitation is not visualized. 11. The interatrial septum was not assessed.  In comparison to the previous echocardiogram(s): Pericardial effusion remains. Do not see any clear evidence of tamponade (RV or RA collapse). These structures are not well visualized. FINDINGS  Left Ventricle: Left ventricular  ejection fraction, by visual estimation, is 55 to 60%. The left ventricle has normal function. The left ventricle is not well visualized. There is no left ventricular hypertrophy. Normal left atrial pressure.  Patient Profile     58 y.o. Caucasian male initially referred to me by [name], his nephrologist for evaluation of pericardial effusion with features of pericardial tamponade.  His risk factors include hypertension, diabetes and hyperlipidemia.  He does have obstructive sleep apnea.  He has chronic renal insufficiency with serum creatinines in the mid 3 range presumably related to hypertension diabetes.  He had a normal cardiac catheterization performed by Dr. Einar Gip remotely.  2D echo performed 11/28/2018 revealed normal LV systolic function with severe concentric LVH and large preferential pericardial effusion with features of tamponade although he was asymptomatic from this.  He was admitted over the weekend with increasing shortness of breath by Dr. Harrell Gave who arrange for him to undergo pericardiocentesis yesterday.  Assessment & Plan    1: Pericardial effusion-unsuccessful attempted subxiphoid pericardiocentesis due to location of the pericardial fluid in the lateral/posterior lateral area with very little  apical fluid.  He underwent successful this subxiphoid pericardial window and pericardial drainage by Dr. Kipp Brood yesterday removing 750 cc of bloody fluid and mural throughout clot from the right atrial surface.  The patient feels clinically improved.  He is remained hemodynamically stable.  There were no arrhythmias.  He still has a pericardial drain in place as well as a right IJ and left radial arterial line.  Initial pathology of the pericardial sample showed chronic inflammation without malignancy.  From our point of view, can be discharged as soon as okay with Dr. Kipp Brood.  2: Essential hypertension-blood pressure today is 136/66.  He is on lisinopril, metoprolol and  hydralazine as an outpatient  For questions or updates, please contact [place] Please consult [email or web address] for contact info under        Signed, Quay Burow, MD  01/18/2019, 8:14 AM

## 2019-01-18 NOTE — Progress Notes (Signed)
Patient has home unit and will self place when ready. RT will continue to monitor.

## 2019-01-19 ENCOUNTER — Other Ambulatory Visit: Payer: Self-pay | Admitting: Student

## 2019-01-19 ENCOUNTER — Encounter (HOSPITAL_COMMUNITY): Payer: Self-pay | Admitting: Thoracic Surgery (Cardiothoracic Vascular Surgery)

## 2019-01-19 DIAGNOSIS — G4733 Obstructive sleep apnea (adult) (pediatric): Secondary | ICD-10-CM

## 2019-01-19 DIAGNOSIS — I3139 Other pericardial effusion (noninflammatory): Secondary | ICD-10-CM

## 2019-01-19 DIAGNOSIS — N184 Chronic kidney disease, stage 4 (severe): Secondary | ICD-10-CM

## 2019-01-19 DIAGNOSIS — I313 Pericardial effusion (noninflammatory): Secondary | ICD-10-CM

## 2019-01-19 LAB — CBC
HCT: 30.8 % — ABNORMAL LOW (ref 39.0–52.0)
Hemoglobin: 9.9 g/dL — ABNORMAL LOW (ref 13.0–17.0)
MCH: 28.4 pg (ref 26.0–34.0)
MCHC: 32.1 g/dL (ref 30.0–36.0)
MCV: 88.5 fL (ref 80.0–100.0)
Platelets: 138 10*3/uL — ABNORMAL LOW (ref 150–400)
RBC: 3.48 MIL/uL — ABNORMAL LOW (ref 4.22–5.81)
RDW: 13.6 % (ref 11.5–15.5)
WBC: 10.2 10*3/uL (ref 4.0–10.5)
nRBC: 0 % (ref 0.0–0.2)

## 2019-01-19 LAB — COMPREHENSIVE METABOLIC PANEL
ALT: 11 U/L (ref 0–44)
AST: 16 U/L (ref 15–41)
Albumin: 2.9 g/dL — ABNORMAL LOW (ref 3.5–5.0)
Alkaline Phosphatase: 60 U/L (ref 38–126)
Anion gap: 8 (ref 5–15)
BUN: 76 mg/dL — ABNORMAL HIGH (ref 6–20)
CO2: 23 mmol/L (ref 22–32)
Calcium: 8.4 mg/dL — ABNORMAL LOW (ref 8.9–10.3)
Chloride: 107 mmol/L (ref 98–111)
Creatinine, Ser: 3.2 mg/dL — ABNORMAL HIGH (ref 0.61–1.24)
GFR calc Af Amer: 24 mL/min — ABNORMAL LOW (ref >60)
GFR calc non Af Amer: 20 mL/min — ABNORMAL LOW (ref >60)
Glucose, Bld: 245 mg/dL — ABNORMAL HIGH (ref 70–99)
Potassium: 3.8 mmol/L (ref 3.5–5.1)
Sodium: 138 mmol/L (ref 135–145)
Total Bilirubin: 0.6 mg/dL (ref 0.3–1.2)
Total Protein: 5.9 g/dL — ABNORMAL LOW (ref 6.5–8.1)

## 2019-01-19 LAB — GLUCOSE, CAPILLARY
Glucose-Capillary: 204 mg/dL — ABNORMAL HIGH (ref 70–99)
Glucose-Capillary: 177 mg/dL — ABNORMAL HIGH (ref 70–99)

## 2019-01-19 MED ORDER — ASPIRIN 81 MG PO TBEC: 81.0000 mg | DELAYED_RELEASE_TABLET | Freq: Every day | ORAL | Status: DC

## 2019-01-19 NOTE — Plan of Care (Signed)
  Problem: Education: Goal: Knowledge of General Education information will improve Description: Including pain rating scale, medication(s)/side effects and non-pharmacologic comfort measures Outcome: Completed/Met   Problem: Health Behavior/Discharge Planning: Goal: Ability to manage health-related needs will improve Outcome: Completed/Met   Problem: Clinical Measurements: Goal: Ability to maintain clinical measurements within normal limits will improve Outcome: Completed/Met Goal: Will remain free from infection Outcome: Completed/Met Goal: Diagnostic test results will improve Outcome: Completed/Met Goal: Respiratory complications will improve Outcome: Completed/Met Goal: Cardiovascular complication will be avoided Outcome: Completed/Met   Problem: Activity: Goal: Risk for activity intolerance will decrease Outcome: Completed/Met   Problem: Nutrition: Goal: Adequate nutrition will be maintained Outcome: Completed/Met   Problem: Coping: Goal: Level of anxiety will decrease Outcome: Completed/Met   Problem: Elimination: Goal: Will not experience complications related to bowel motility Outcome: Completed/Met Goal: Will not experience complications related to urinary retention Outcome: Completed/Met   Problem: Pain Managment: Goal: General experience of comfort will improve Outcome: Completed/Met   Problem: Safety: Goal: Ability to remain free from injury will improve Outcome: Completed/Met   Problem: Skin Integrity: Goal: Risk for impaired skin integrity will decrease Outcome: Completed/Met   Discharge instructions reviewed with patient.  These included, but were not limited to, the following:  recommended activity with limitations on lifting, medications, pain managements, when to call the MD, incision care, specific discharge instructions re:  medications / potential side effects, pain management, prevention of constipation post-op, S&S of infection, continued use  of IS,  recommended diet, follow-up visits, etc.  Comprehension of information ascertained via "teach back" technique.  Drain removed.  Patient asymptomatic.  Anxious to go home.

## 2019-01-19 NOTE — Progress Notes (Signed)
Progress Note  Patient Name: Samuel Reed Date of Encounter: 01/19/2019  Primary Cardiologist: Dr. Quay Burow  Subjective   No complaints this morning.  Denies chest pain or shortness of breath.  Hemodynamically stable.  Status post subxiphoid pericardial window and pericardial drainage by Dr. Kipp Brood 01/17/19 removing 750 cc of bloody fluid.  The patient feels clinically improved.  JP drain still in place with minimal drainage.  Right IJ line and left radial art line has been discontinued.  Inpatient Medications    Scheduled Meds: . acetaminophen  1,000 mg Oral Q6H   Or  . acetaminophen (TYLENOL) oral liquid 160 mg/5 mL  1,000 mg Oral Q6H  . albuterol  5 mg Nebulization Once  . bisacodyl  10 mg Oral Daily  . calcitRIOL  0.5 mcg Oral Daily  . Chlorhexidine Gluconate Cloth  6 each Topical Daily  . cholecalciferol  2,000 Units Oral Q breakfast  . doxazosin  4 mg Oral QHS  . enoxaparin (LOVENOX) injection  40 mg Subcutaneous Daily  . ferrous sulfate  650 mg Oral Q breakfast  . hydrALAZINE  100 mg Oral Q8H  . insulin aspart  0-20 Units Subcutaneous TID WC  . insulin aspart  0-5 Units Subcutaneous QHS  . insulin glargine  35 Units Subcutaneous QHS  . metoprolol tartrate  100 mg Oral BID  . rosuvastatin  20 mg Oral Daily  . senna-docusate  1 tablet Oral QHS   Continuous Infusions: . sodium chloride Stopped (01/17/19 2255)   PRN Meds: Place/Maintain arterial line **AND** sodium chloride, fentaNYL (SUBLIMAZE) injection, ondansetron (ZOFRAN) IV, oxyCODONE, traMADol   Vital Signs    Vitals:   01/19/19 0500 01/19/19 0600 01/19/19 0640 01/19/19 0700  BP: 136/73 139/72  (!) 147/81  Pulse:    60  Resp: 19 16  15   Temp:   98 F (36.7 C)   TempSrc:   Oral   SpO2: 97% 95%  93%  Weight:        Intake/Output Summary (Last 24 hours) at 01/19/2019 0852 Last data filed at 01/18/2019 2000 Gross per 24 hour  Intake 720 ml  Output 120 ml  Net 600 ml   Last 3 Weights  01/17/2019 01/13/2019 08/19/2018  Weight (lbs) 274 lb 14.6 oz 291 lb 9.6 oz 260 lb  Weight (kg) 124.7 kg 132.269 kg 117.935 kg      Telemetry    Normal sinus rhythm- Personally Reviewed  ECG   None today- Personally Reviewed  Physical Exam   GEN: No acute distress.   Neck: No JVD, still has a central line in place Cardiac: RRR, no murmurs, rubs, or gallops.  Respiratory: Clear to auscultation bilaterally. GI: Soft, nontender, non-distended  MS: No edema; No deformity. Neuro:  Nonfocal  Psych: Normal affect  Extremities: Left radial A-line remains in place  Labs    High Sensitivity Troponin:   Recent Labs  Lab 01/14/19 2211 01/15/19 0128 01/16/19 1825  TROPONINIHS 26* 30* 214*      Chemistry Recent Labs  Lab 01/14/19 2211 01/15/19 0128 01/16/19 1825 01/17/19 0214 01/18/19 0426 01/18/19 0759 01/19/19 0242  NA 143   < > 141 143 140 137 138  K 3.9   < > 3.8 3.7 3.8 3.9 3.8  CL 109   < > 107 108  --  105 107  CO2 21*   < > 21* 23  --  21* 23  GLUCOSE 179*   < > 257* 246*  --  367* 245*  BUN 88*   < > 72* 69*  --  66* 76*  CREATININE 4.73*  --  3.40* 3.09*  --  3.12* 3.20*  CALCIUM 9.1   < > 8.8* 8.8*  --  8.4* 8.4*  PROT 7.1  --  6.7  --   --   --  5.9*  ALBUMIN 3.8  --  3.7  --   --   --  2.9*  AST 24  --  20  --   --   --  16  ALT 26  --  22  --   --   --  11  ALKPHOS 106  --  73  --   --   --  60  BILITOT 0.6  --  1.3*  --   --   --  0.6  GFRNONAA 13*  --  19* 21*  --  21* 20*  GFRAA 15*  --  22* 25*  --  24* 24*  ANIONGAP 13   < > 13 12  --  11 8   < > = values in this interval not displayed.     Hematology Recent Labs  Lab 01/16/19 1825 01/18/19 0426 01/18/19 0759 01/19/19 0242  WBC 8.5  --  11.9* 10.2  RBC 4.14*  --  3.71* 3.48*  HGB 11.9* 10.5* 10.8* 9.9*  HCT 36.3* 31.0* 33.4* 30.8*  MCV 87.7  --  90.0 88.5  MCH 28.7  --  29.1 28.4  MCHC 32.8  --  32.3 32.1  RDW 13.7  --  13.8 13.6  PLT 150  --  139* 138*    BNP Recent Labs  Lab  01/14/19 2211  BNP 273.3*     DDimer No results for input(s): DDIMER in the last 168 hours.   Radiology    DG CHEST PORT 1 VIEW  Result Date: 01/18/2019 CLINICAL DATA:  Status post pericardial window procedure. EXAM: PORTABLE CHEST 1 VIEW COMPARISON:  Chest x-ray 01/17/2019 FINDINGS: The right IJ catheter is stable. Suspect pericardial drainage catheter on the left. The heart is mildly enlarged but stable. No infiltrates or effusions. IMPRESSION: 1. Stable support apparatus. 2. No infiltrates or effusions. Electronically Signed   By: Marijo Sanes M.D.   On: 01/18/2019 08:36   DG Chest Port 1 View  Result Date: 01/17/2019 CLINICAL DATA:  Status post pericardial window creation. EXAM: PORTABLE CHEST 1 VIEW COMPARISON:  01/14/2019. FINDINGS: Right IJ line noted with tip over SVC. Cardiomegaly with pulmonary venous congestion and bilateral interstitial prominence consistent CHF. No pleural effusion or pneumothorax. IMPRESSION: 1.  Right IJ line noted with tip over SVC. 2. Cardiomegaly with pulmonary venous congestion bilateral interstitial prominence consistent with congestive heart failure. Electronically Signed   By: Marcello Moores  Register   On: 01/17/2019 14:17    Cardiac Studies   2D echocardiogram (01/16/2019)  IMPRESSIONS    1. Left ventricular ejection fraction, by visual estimation, is 55 to 60%. The left ventricle has normal function. There is no left ventricular hypertrophy.  2. Global right ventricle was not well visualized.The right ventricular size is not well visualized. No increase in right ventricular wall thickness.  3. Left atrial size was not well visualized.  4. Right atrial size was not well visualized.  5. Large pericardial effusion.  6. The pericardial effusion is posterior to the left ventricle and the left atrium and lateral to the left ventricle.  7. The mitral valve was not well visualized. not assessed mitral valve regurgitation. No  evidence of mitral stenosis.   8. The tricuspid valve is not well visualized. Tricuspid valve regurgitation not assessed.  9. The aortic valve was not well visualized. Aortic valve regurgitation is not visualized. No evidence of aortic valve sclerosis or stenosis. 10. The pulmonic valve was not well visualized. Pulmonic valve regurgitation is not visualized. 11. The interatrial septum was not assessed.  In comparison to the previous echocardiogram(s): Pericardial effusion remains. Do not see any clear evidence of tamponade (RV or RA collapse). These structures are not well visualized. FINDINGS  Left Ventricle: Left ventricular ejection fraction, by visual estimation, is 55 to 60%. The left ventricle has normal function. The left ventricle is not well visualized. There is no left ventricular hypertrophy. Normal left atrial pressure.  Patient Profile     58 y.o. Caucasian male initially referred to me by [name], his nephrologist for evaluation of pericardial effusion with features of pericardial tamponade.  His risk factors include hypertension, diabetes and hyperlipidemia.  He does have obstructive sleep apnea.  He has chronic renal insufficiency with serum creatinines in the mid 3 range presumably related to hypertension diabetes.  He had a normal cardiac catheterization performed by Dr. Einar Gip remotely.  2D echo performed 11/28/2018 revealed normal LV systolic function with severe concentric LVH and large preferential pericardial effusion with features of tamponade although he was asymptomatic from this.  He was admitted over the weekend with increasing shortness of breath by Dr. Harrell Gave who arrange for him to undergo pericardiocentesis yesterday.  Assessment & Plan    1: Pericardial effusion-unsuccessful attempted subxiphoid pericardiocentesis due to location of the pericardial fluid in the lateral/posterior lateral area with very little  apical fluid.  He underwent successful this subxiphoid pericardial window and  pericardial drainage by Dr. Kipp Brood yesterday removing 750 cc of bloody fluid and mural throughout clot from the right atrial surface.  The patient feels clinically improved.  He is remained hemodynamically stable.  There were no arrhythmias.  He still has a pericardial drain in place with minimal drainage.  The right IJ line and left radial art line have been discontinued.  Pathology from the fluid and tissue sample showed chronic inflammation without malignancy.  He can be discharged home from our point of view when okay with Dr. Lightfoot/cardiothoracic surgery.  Return office visit with me in 2 to 3 weeks.  We will arrange to get follow-up 2D echo as well.  2: Essential hypertension-blood pressure today is 136/66.  He is on lisinopril, metoprolol and hydralazine as an outpatient  For questions or updates, please contact [place] Please consult [email or web address] for contact info under        Signed, Quay Burow, MD  01/19/2019, 8:52 AM

## 2019-01-19 NOTE — Discharge Summary (Addendum)
Discharge Summary    Patient ID: Samuel Reed MRN: [id number]; DOB: [birth year]  Admit date: 01/14/2019 Discharge date: 01/19/2019  Primary Care Provider: [name], MD  Primary Cardiologist: Quay Burow, MD  Primary Electrophysiologist:  None   Discharge Diagnoses    Principal Problem:   Pericardial effusion Active Problems:   Hyperlipemia   Essential hypertension, benign   OSA (obstructive sleep apnea)   Uncontrolled type 2 diabetes mellitus with stage 3 chronic kidney disease, with long-term current use of insulin (HCC)   Chronic renal disease, stage IV (HCC)   S/P pericardial window creation    Diagnostic Studies/Procedures    2D echocardiogram (01/16/2019)   IMPRESSIONS  1. Left ventricular ejection fraction, by visual estimation, is 55 to 60%. The left ventricle has normal function. There is no left ventricular hypertrophy.  2. Global right ventricle was not well visualized.The right ventricular size is not well visualized. No increase in right ventricular wall thickness.  3. Left atrial size was not well visualized.  4. Right atrial size was not well visualized.  5. Large pericardial effusion.  6. The pericardial effusion is posterior to the left ventricle and the left atrium and lateral to the left ventricle.  7. The mitral valve was not well visualized. not assessed mitral valve regurgitation. No evidence of mitral stenosis.  8. The tricuspid valve is not well visualized. Tricuspid valve regurgitation not assessed.  9. The aortic valve was not well visualized. Aortic valve regurgitation is not visualized. No evidence of aortic valve sclerosis or stenosis. 10. The pulmonic valve was not well visualized. Pulmonic valve regurgitation is not visualized. 11. The interatrial septum was not assessed.   In comparison to the previous echocardiogram(s): Pericardial effusion remains. Do not see any clear evidence of tamponade (RV or RA collapse). These  structures are not well visualized. FINDINGS  Left Ventricle: Left ventricular ejection fraction, by visual estimation, is 55 to 60%. The left ventricle has normal function. The left ventricle is not well visualized. There is no left ventricular hypertrophy. Normal left atrial pressure. _____________  Echocardiogram 01/15/2019 IMPRESSIONS      1. Left ventricular ejection fraction, by visual estimation, is 60 to 65%. The left ventricle has normal function. There is moderately increased left ventricular hypertrophy.  2. Left ventricular diastolic parameters are consistent with Grade II diastolic dysfunction (pseudonormalization).  3. Global right ventricle has normal systolic function.The right ventricular size is normal. No increase in right ventricular wall thickness.  4. No clear RV diastolic collapse on parasternal or short axis views. Apical views do not entirely show the RV free wall for interpretation.  5. Left atrial size was severely dilated.  6. Large pericardial effusion.  7. The pericardial effusion is circumferential.  8. The mitral valve is normal in structure. Trivial mitral valve regurgitation. No evidence of mitral stenosis.  9. The tricuspid valve is normal in structure. Tricuspid valve regurgitation is trivial. 10. The tricuspid valve was normal in structure. Tricuspid valve regurgitation is trivial. 11. The aortic valve is tricuspid. Aortic valve regurgitation is not visualized. No evidence of aortic valve sclerosis or stenosis. 12. The inferior vena cava is dilated in size with <50% respiratory variability, suggesting right atrial pressure of 15 mmHg. 13. Echo without evidence of tamponade. Large circumferential pericardial effusion appears similar to study from 2 days ago. No significant inflow velocity variation, no clear RV collapse. IVC is somewhat dilated and cannot be completely seen with  respiratory change, but overall picture does not suggest  tamponade. Recommend  clinical correlation. 14. The interatrial septum was not assessed.   History of Present Illness     HANEEF HALLQUIST is a 58 y.o. male with past medical history significant for hypertension, hyperlipidemia, obstructive sleep apnea, CKD who was recently diagnosed with large pericardial effusion and presented to the hospital on 01/14/2019 with complaints of shortness of breath.  The patient also has a history of COVID-19 infection approximately 6 weeks ago.  The patient had been seen in clinic on 01/13/2019 for evaluation of a large pericardial effusion found on echocardiogram ordered by his nephrologist on 11/28/2018.  The echocardiogram showed a large circumferential pericardial effusion with echo features of tamponade RV/RA collapse and other characteristics that were not consistent including lack of variation of flow across the mitral valve and a compressible IVC.  He was asymptomatic at that time and the plan was for close monitoring.  On presentation to the emergency department on the evening of 01/14/2019 he reported that he had had shortness of breath throughout the day.  He had not noticed this previously.  He was noted to have chronic lower extremity edema which was unchanged.  He denied lightheadedness, dizziness, syncope, chest pain, orthopnea or PND.  He simply reported that he noticed shortness of breath when performing his usual activities.  In the ED he was found to be hemodynamically stable, without tachycardia or hypotension.  His labs showed worsened renal function with creatinine up to 4.7.  He was admitted for further management.  Hospital Course     Consultants: Cardiothoracic surgery  The patient remained stable.  His torsemide was continued for 1 dose due to increased volume status, then discontinued.  Metolazone and lisinopril were held due to worsened renal function.  Subxiphoid pericardiocentesis was attempted but was not successful.  Cardiothoracic surgery was consulted and  patient underwent subxiphoid pericardial window on 01/16/2019 with removal of 750 mL of bloody pericardial fluid and an evacuation of a large clot from the right atrial surface.  Pericardium appeared normal.  The patient feels clinically improved.  He has remained hemodynamically stable.  There were no arrhythmias.  His pericardial drain has been discontinued.  Pathology from the fluid and tissue sample showed chronic inflammation without malignancy.  It appears that baseline serum creatinine has been roughly 33.3.  Creatinine was elevated to 4.73 on presentation and has trended down to 3.20.  The patient will be discharged on his home Torsemide and PRN Metolazone.  Lisinopril has been stopped due to worsening renal function.  Will restart home Amlodipine and continue his Hydralazine and Cardura.  The patient had been taking Aspirin 325 mg prior to hospitalization.  There is no apparent indication for high-dose aspirin.  He said that he was told to take this by Nephrology.  We have reduced his aspirin to 81 mg daily and advised him to follow-up with nephrology.  We will plan to follow-up metabolic panel and echocardiogram in 1 week and follow-up office visit with [name] in 2-3 weeks.  Patient has been seen by [name] today and deemed ready for discharge home. Will arrange outpatient follow-up. CT surgery will also arrange for follow-up in their office. Discharge medications are listed below.   Did the patient have an acute coronary syndrome (MI, NSTEMI, STEMI, etc) this admission?:  No                               Did the  patient have a percutaneous coronary intervention (stent / angioplasty)?:  No.   _____________  Discharge Vitals Blood pressure (!) 143/65, pulse 67, temperature 98.1 F (36.7 C), temperature source Oral, resp. rate 18, weight 124.7 kg, SpO2 97 %.  Filed Weights   01/17/19 0600  Weight: 124.7 kg    Labs & Radiologic Studies    CBC Recent Labs    01/18/19 0759  01/19/19 0242  WBC 11.9* 10.2  HGB 10.8* 9.9*  HCT 33.4* 30.8*  MCV 90.0 88.5  PLT 139* 030*   Basic Metabolic Panel Recent Labs    01/18/19 0759 01/19/19 0242  NA 137 138  K 3.9 3.8  CL 105 107  CO2 21* 23  GLUCOSE 367* 245*  BUN 66* 76*  CREATININE 3.12* 3.20*  CALCIUM 8.4* 8.4*   Liver Function Tests Recent Labs    01/16/19 1825 01/19/19 0242  AST 20 16  ALT 22 11  ALKPHOS 73 60  BILITOT 1.3* 0.6  PROT 6.7 5.9*  ALBUMIN 3.7 2.9*   No results for input(s): LIPASE, AMYLASE in the last 72 hours. High Sensitivity Troponin:   Recent Labs  Lab 01/14/19 2211 01/15/19 0128 01/16/19 1825  TROPONINIHS 26* 30* 214*    BNP Invalid input(s): POCBNP D-Dimer No results for input(s): DDIMER in the last 72 hours. Hemoglobin A1C Recent Labs    01/16/19 1825  HGBA1C 9.7*   Fasting Lipid Panel Recent Labs    01/16/19 1825  CHOL 92  HDL 23*  LDLCALC 26  TRIG 216*  CHOLHDL 4.0   Thyroid Function Tests No results for input(s): TSH, T4TOTAL, T3FREE, THYROIDAB in the last 72 hours.  Invalid input(s): FREET3 _____________  CARDIAC CATHETERIZATION  Result Date: 01/16/2019 Aborted unsuccessful attempt at pericardiocentesis via the subxiphoid approach. The patient will be scheduled to undergo pericardial window tomorrow with cardiothoracic surgery.  DG CHEST PORT 1 VIEW  Result Date: 01/18/2019 CLINICAL DATA:  Status post pericardial window procedure. EXAM: PORTABLE CHEST 1 VIEW COMPARISON:  Chest x-ray 01/17/2019 FINDINGS: The right IJ catheter is stable. Suspect pericardial drainage catheter on the left. The heart is mildly enlarged but stable. No infiltrates or effusions. IMPRESSION: 1. Stable support apparatus. 2. No infiltrates or effusions. Electronically Signed   By: Marijo Sanes M.D.   On: 01/18/2019 08:36   DG Chest Port 1 View  Result Date: 01/17/2019 CLINICAL DATA:  Status post pericardial window creation. EXAM: PORTABLE CHEST 1 VIEW COMPARISON:   01/14/2019. FINDINGS: Right IJ line noted with tip over SVC. Cardiomegaly with pulmonary venous congestion and bilateral interstitial prominence consistent CHF. No pleural effusion or pneumothorax. IMPRESSION: 1.  Right IJ line noted with tip over SVC. 2. Cardiomegaly with pulmonary venous congestion bilateral interstitial prominence consistent with congestive heart failure. Electronically Signed   By: Marcello Moores  Register   On: 01/17/2019 14:17   DG Chest Portable 1 View  Result Date: 01/14/2019 CLINICAL DATA:  Shortness of breath. EXAM: PORTABLE CHEST 1 VIEW COMPARISON:  January 13, 2017 FINDINGS: There is cardiomegaly. There is no focal infiltrate. No large pleural effusion or pneumothorax. There is no acute osseous abnormality. There may be mild volume overload. IMPRESSION: 1. Cardiomegaly without focal infiltrate. 2. Possible mild volume overload. 3. No pneumothorax or pleural effusion. Electronically Signed   By: Constance Holster M.D.   On: 01/14/2019 22:53   ECHOCARDIOGRAM LIMITED  Result Date: 01/16/2019   ECHOCARDIOGRAM REPORT   Patient Name:   MATTHE SLOANE [facility] Date of Exam: 01/16/2019 Medical Rec #:  [id number]       Height:       71.0 in Accession #:    [id number]      Weight:       291.6 lb Date of Birth:  [birth year]       BSA:          2.48 m Patient Age:    42 years        BP:           134/62 mmHg Patient Gender: M               HR:           80 bpm. Exam Location:  Inpatient Procedure: Limited Color Doppler Indications:    Pericardial effusion 423.9/I31.3  History:        Patient has prior history of Echocardiogram examinations, most                 recent 01/15/2019. Risk Factors:Hypertension, Dyslipidemia and                 Sleep Apnea. Chronic kidney disease. chronic venous                 insufficiency.  Sonographer:    Darlina Sicilian RDCS Referring Phys: [place]  1. Left ventricular ejection fraction, by visual estimation, is 55 to 60%. The left ventricle has  normal function. There is no left ventricular hypertrophy.  2. Global right ventricle was not well visualized.The right ventricular size is not well visualized. No increase in right ventricular wall thickness.  3. Left atrial size was not well visualized.  4. Right atrial size was not well visualized.  5. Large pericardial effusion.  6. The pericardial effusion is posterior to the left ventricle and the left atrium and lateral to the left ventricle.  7. The mitral valve was not well visualized. not assessed mitral valve regurgitation. No evidence of mitral stenosis.  8. The tricuspid valve is not well visualized. Tricuspid valve regurgitation not assessed.  9. The aortic valve was not well visualized. Aortic valve regurgitation is not visualized. No evidence of aortic valve sclerosis or stenosis. 10. The pulmonic valve was not well visualized. Pulmonic valve regurgitation is not visualized. 11. The interatrial septum was not assessed. In comparison to the previous echocardiogram(s): Pericardial effusion remains. Do not see any clear evidence of tamponade (RV or RA collapse). These structures are not well visualized. FINDINGS  Left Ventricle: Left ventricular ejection fraction, by visual estimation, is 55 to 60%. The left ventricle has normal function. The left ventricle is not well visualized. There is no left ventricular hypertrophy. Normal left atrial pressure. Right Ventricle: The right ventricular size is not well visualized. No increase in right ventricular wall thickness. Global RV systolic function is was not well visualized. Left Atrium: Left atrial size was not well visualized. Right Atrium: Right atrial size was not well visualized Pericardium: A large pericardial effusion is present. The pericardial effusion is posterior to the left ventricle and the left atrium and lateral to the left ventricle. Mitral Valve: The mitral valve was not well visualized. Not assessed mitral valve regurgitation. No evidence  of mitral valve stenosis by observation. Tricuspid Valve: The tricuspid valve is not well visualized. Tricuspid valve regurgitation not assessed. Aortic Valve: The aortic valve was not well visualized. Aortic valve regurgitation is not visualized. The aortic valve is structurally normal, with no evidence of sclerosis or stenosis. Pulmonic Valve: The pulmonic valve  was not well visualized. Pulmonic valve regurgitation is not visualized. Pulmonic regurgitation is not visualized. Aorta: The aortic root, ascending aorta and aortic arch are all structurally normal, with no evidence of dilitation or obstruction. Venous: The inferior vena cava was not well visualized. IAS/Shunts: The interatrial septum was not assessed. There is no evidence of a patent foramen ovale. No ventricular septal defect is seen or detected. There is no evidence of an atrial septal defect.  Candee Furbish MD Electronically signed by Candee Furbish MD Signature Date/Time: 01/16/2019/5:16:44 PM    Final    ECHOCARDIOGRAM LIMITED  Result Date: 01/15/2019   ECHOCARDIOGRAM LIMITED REPORT   Patient Name:   INRI SOBIESKI [facility] Date of Exam: 01/15/2019 Medical Rec #:  [id number]       Height:       71.0 in Accession #:    [id number]      Weight:       291.6 lb Date of Birth:  [birth year]       BSA:          2.48 m Patient Age:    76 years        BP:           159/72 mmHg Patient Gender: M               HR:           76 bpm. Exam Location:  Inpatient  Procedure: Limited Echo, Limited Color Doppler and Cardiac Doppler Indications:    pericardial effusion 423.9  History:        Patient has prior history of Echocardiogram examinations, most                 recent 01/13/2019.  Sonographer:    Johny Chess Referring Phys: [id number] [place]  1. Left ventricular ejection fraction, by visual estimation, is 60 to 65%. The left ventricle has normal function. There is moderately increased left ventricular hypertrophy.  2. Left ventricular diastolic  parameters are consistent with Grade II diastolic dysfunction (pseudonormalization).  3. Global right ventricle has normal systolic function.The right ventricular size is normal. No increase in right ventricular wall thickness.  4. No clear RV diastolic collapse on parasternal or short axis views. Apical views do not entirely show the RV free wall for interpretation.  5. Left atrial size was severely dilated.  6. Large pericardial effusion.  7. The pericardial effusion is circumferential.  8. The mitral valve is normal in structure. Trivial mitral valve regurgitation. No evidence of mitral stenosis.  9. The tricuspid valve is normal in structure. Tricuspid valve regurgitation is trivial. 10. The tricuspid valve was normal in structure. Tricuspid valve regurgitation is trivial. 11. The aortic valve is tricuspid. Aortic valve regurgitation is not visualized. No evidence of aortic valve sclerosis or stenosis. 12. The inferior vena cava is dilated in size with <50% respiratory variability, suggesting right atrial pressure of 15 mmHg. 13. Echo without evidence of tamponade. Large circumferential pericardial effusion appears similar to study from 2 days ago. No significant inflow velocity variation, no clear RV collapse. IVC is somewhat dilated and cannot be completely seen with respiratory change, but overall picture does not suggest tamponade. Recommend clinical correlation. 14. The interatrial septum was not assessed. FINDINGS  Left Ventricle: Left ventricular ejection fraction, by visual estimation, is 60 to 65%. There is moderately increased left ventricular wall thickness. Concentric left ventricular hypertrophy. Left ventricular diastolic parameters are consistent with Grade II diastolic dysfunction (pseudonormalization). Right Ventricle:  The right ventricular size is normal. No increase in right ventricular wall thickness. Global RV systolic function is has normal systolic function. No clear RV diastolic collapse  on parasternal or short axis views. Apical views do not entirely show the RV free wall for interpretation. Left Atrium: Left atrial size was severely dilated. Pericardium: A large pericardial effusion is present is seen. A large pericardial effusion is present. The pericardial effusion is circumferential. Largest area of effusion appears to be inferior and posterior. Mitral Valve: The mitral valve is normal in structure. No evidence of mitral valve stenosis by observation. MV Area by PHT, 3.72 cm. MV PHT, 59.16 msec. Trivial mitral valve regurgitation. Tricuspid Valve: The tricuspid valve is normal in structure. Tricuspid valve regurgitation is trivial. Aortic Valve: The aortic valve is tricuspid. Aortic valve regurgitation is not visualized. The aortic valve is structurally normal, with no evidence of sclerosis or stenosis. Pulmonic Valve: The pulmonic valve was grossly normal. Pulmonic valve regurgitation is not visualized by color flow Doppler. Pulmonic regurgitation is not visualized by color flow Doppler. Venous: The inferior vena cava is dilated in size with less than 50% respiratory variability, suggesting right atrial pressure of 15 mmHg. Shunts: The interatrial septum was not assessed.  LEFT VENTRICLE          Normals PLAX 2D LVIDd:         5.00 cm  3.6 cm   Diastology                 Normals LVIDs:         3.20 cm  1.7 cm   LV e' lateral:   9.36 cm/s 6.42 cm/s LV PW:         1.40 cm  1.4 cm   LV E/e' lateral: 13.6      15.4 LV IVS:        1.40 cm  1.3 cm   LV e' medial:    5.55 cm/s 6.96 cm/s LVOT diam:     2.50 cm  2.0 cm   LV E/e' medial:  22.9      6.96 LV SV:         77 ml    79 ml LV SV Index:   29.37    45 ml/m2 LVOT Area:     4.91 cm 3.14 cm2  LEFT ATRIUM         Index LA diam:    5.00 cm 2.02 cm/m  AORTIC VALVE             Normals LVOT Vmax:   111.00 cm/s LVOT Vmean:  79.700 cm/s 75 cm/s LVOT VTI:    0.263 m     25.3 cm  AORTA                 Normals Ao Root diam: 3.60 cm 31 mm MITRAL VALVE               Normals MV Area (PHT): 3.72 cm              SHUNTS MV PHT:        59.16 msec 55 ms      Systemic VTI:  0.26 m MV Decel Time: 204 msec   187 ms     Systemic Diam: 2.50 cm MV E velocity: 127.00 cm/s 103 cm/s MV A velocity: 65.10 cm/s  70.3 cm/s MV E/A ratio:  1.95        1.5  Buford Dresser MD Electronically signed by  Buford Dresser MD Signature Date/Time: 01/15/2019/12:49:17 PMThe mitral valve is normal in structure.    Final    ECHOCARDIOGRAM LIMITED  Result Date: 01/13/2019   ECHOCARDIOGRAM LIMITED REPORT   Patient Name:   ERYCK NEGRON [facility] Date of Exam: 01/13/2019 Medical Rec #:  [id number]       Height:       71.0 in Accession #:    [id number]      Weight:       291.0 lb Date of Birth:  02/25/1960       BSA:          2.47 m Patient Age:    38 years        BP:           136/62 mmHg Patient Gender: M               HR:           63 bpm. Exam Location:  Outpatient  Procedure: Limited Echo, Cardiac Doppler, Limited Color Doppler and Intracardiac            Opacification Agent                                 MODIFIED REPORT  This report was modified by Skeet Latch MD on 01/13/2019 due to comments                              added to impressions. Indications:     Tamponade 423.3 I31.4  History:         Patient has prior history of Echocardiogram examinations, most                  recent 11/28/2018. COVID-19 Positive. CKD.  Sonographer:     Jonelle Sidle Dance Referring Phys:  [place] Phys: Skeet Latch MD IMPRESSIONS  1. Left ventricular ejection fraction, by visual estimation, is 60 to 65%. The left ventricle has normal function. Left ventricular septal wall thickness was mildly increased. Mildly increased left ventricular posterior wall thickness. There is mildly increased left ventricular hypertrophy.  2. Left ventricular diastolic parameters are consistent with Grade II diastolic dysfunction (pseudonormalization).  3. Mildly dilated left ventricular internal  cavity size.  4. The left ventricle has no regional wall motion abnormalities.  5. Global right ventricle has normal systolic function.The right ventricular size is normal. No increase in right ventricular wall thickness.  6. Left atrial size was severely dilated.  7. Right atrial size was moderately dilated.  8. Large pericardial effusion.  9. The pericardial effusion is circumferential. 10. Large pericardial effusion slightly improved from 11/2018. RV diastolic collapse is noted and the IVC is dilated. However the IVC collapses with inspiration and there is no significant respiratory inflow variation. Findings are not consistent with cardiac tamponade. 11. The mitral valve is normal in structure. No evidence of mitral valve regurgitation. No evidence of mitral stenosis. 12. The tricuspid valve is normal in structure. Tricuspid valve regurgitation is not demonstrated. 13. The tricuspid valve was normal in structure. Tricuspid valve regurgitation is not demonstrated. 14. The aortic valve is tricuspid. Aortic valve regurgitation is not visualized. No evidence of aortic valve sclerosis or stenosis. 15. The pulmonic valve was normal in structure. 16. There is mild dilatation of the ascending aorta measuring 37 mm. 17. The inferior vena cava is dilated in  size with >50% respiratory variability, suggesting right atrial pressure of 8 mmHg. 18. Left ventricular ejection fraction by PLAX is is 62 % FINDINGS  Left Ventricle: Left ventricular ejection fraction, by visual estimation, is 60 to 65%. Left ventricular ejection fraction by PLAX is is 62 % The left ventricle has normal function. The left ventricle has no regional wall motion abnormalities. The left ventricular internal cavity size was mildly dilated left ventricle. Left ventricular septal wall thickness was mildly increased. Mildly increased left ventricular posterior wall thickness. There is mildly increased left ventricular wall thickness. Left ventricular  diastolic parameters are consistent with Grade II diastolic dysfunction (pseudonormalization). Indeterminate filling pressures. Right Ventricle: The right ventricular size is normal. No increase in right ventricular wall thickness. Global RV systolic function is has normal systolic function. Left Atrium: Left atrial size was severely dilated. Right Atrium: Right atrial size was moderately dilated. Right atrial pressure is estimated at 8 mmHg. Pericardium: A large pericardial effusion is present is seen. A large pericardial effusion is present. The pericardial effusion is circumferential. There is no evidence of cardiac tamponade. Large pericardial effusion slightly improved from 11/2018. RV diastolic collapse is noted and the IVC is dilated. However the IVC collapses with inspiration and there is no significant respiratory inflow variation. Findings are not consistent with cardiac tamponade. Mitral Valve: The mitral valve is normal in structure. No evidence of mitral valve stenosis by observation. No evidence of mitral valve regurgitation. Tricuspid Valve: The tricuspid valve is normal in structure. Tricuspid valve regurgitation is not demonstrated. Aortic Valve: The aortic valve is tricuspid. Aortic valve regurgitation is not visualized. The aortic valve is structurally normal, with no evidence of sclerosis or stenosis. Pulmonic Valve: The pulmonic valve was normal in structure. Pulmonic valve regurgitation is not visualized by color flow Doppler. Pulmonic regurgitation is not visualized by color flow Doppler. Aorta: The aortic root, ascending aorta and aortic arch are all structurally normal, with no evidence of dilitation or obstruction. There is mild dilatation of the ascending aorta measuring 37 mm. Venous: The inferior vena cava is dilated in size with greater than 50% respiratory variability, suggesting right atrial pressure of 8 mmHg. Shunts: There is no evidence of a patent foramen ovale. No ventricular  septal defect is seen or detected. There is no evidence of an atrial septal defect. No atrial level shunt detected by color flow Doppler.  LEFT VENTRICLE                                              Normals PLAX 2D LV EF:         Left ventricular ejection fraction by PLAX   65 %                is is 62 % LVIDd:         5.64 cm                                      3.6 cm LVIDs:         3.72 cm                                      1.7 cm LV PW:  1.34 cm                                      1.4 cm LV IVS:        1.25 cm                                      1.3 cm LVOT diam:     2.30 cm                                      2.0 cm LV SV:         97 ml                                        79 ml LV SV Index:   37.02                                        45 ml/m2 LVOT Area:     4.15 cm                                     3.14 cm2  LEFT ATRIUM         Index LA diam:    5.40 cm 2.18 cm/m   AORTA                 Normals Ao Root diam: 3.80 cm 31 mm Ao Asc diam:  3.70 cm 31 mm  SHUNTS Systemic Diam: 2.30 cm  Skeet Latch MD Electronically signed by Skeet Latch MD Signature Date/Time: 01/13/2019/4:08:59 PMThe mitral valve is normal in structure.    Final (Updated)    Disposition   Patient is being discharged home today in good condition.  Follow-up Plans & Appointments    Follow-up Information     [place] Office Follow up.   Specialty: Cardiology Why: You will be scheduled for an echocardiogram in 1 week. The office will call you to schedule.  Contact information: [address] [place]        Lorretta Harp, MD Follow up.   Specialties: Cardiology, Radiology Why: You will need cardiology follow up with [name] in 2-3 weeks. The office will call you to schedule. If you do not here from them by Monday afternoon, please call the office.  Contact information: [address] [place] [address]  [phone]         Lajuana Matte, MD Follow up.   Specialty: Cardiothoracic Surgery Why: OFFICE WILL CONTACT PATIENT FOR APPOINTMENT, ALSO OBRAIN A CHEST XRAY FROM [facility] 1/2 PRIOR. IT'S IN SAME OFFICE COMPLEX Contact information: [address] [place] [address] [phone]         Nephrologist Follow up.   Why: Please call and schedule follow-up with your Nephrologist in the next couple of weeks as well.  Discharge Instructions     Diet - low sodium heart healthy   Complete by: As directed    Increase activity slowly   Complete by: As directed        Discharge Medications   Allergies as of 01/19/2019   No Known Allergies      Medication List     STOP taking these medications    lisinopril 20 MG tablet Commonly known as: ZESTRIL       TAKE these medications    amLODipine 10 MG tablet Commonly known as: NORVASC TAKE 1 TABLET BY MOUTH EVERY DAY   aspirin 81 MG EC tablet Take 1 tablet (81 mg total) by mouth daily. What changed:  medication strength how much to take   Basaglar KwikPen 100 UNIT/ML Sopn INJECT 65 UNITS INTO THE SKIN AT BEDTIME. What changed: See the new instructions.   calcitRIOL 0.5 MCG capsule Commonly known as: ROCALTROL Take 0.5 mcg by mouth daily.   doxazosin 4 MG tablet Commonly known as: CARDURA TAKE 1 TABLET (4 MG TOTAL) BY MOUTH AT BEDTIME. What changed: See the new instructions.   Ferrous Gluconate 324 (37.5 Fe) MG Tabs Take 2 tablets by mouth daily.   fluticasone 50 MCG/ACT nasal spray Commonly known as: FLONASE PLACE 1 SPRAY INTO BOTH NOSTRILS 2 (TWO) TIMES DAILY.   hydrALAZINE 100 MG tablet Commonly known as: APRESOLINE Take 100 mg by mouth 3 (three) times daily. What changed: Another medication with the same name was removed. Continue taking this medication, and follow the directions you see here.   insulin lispro 100 UNIT/ML KwikPen Commonly known as:  HumaLOG KwikPen INJECT 10-20 UNITS THREE TIMES DAILY WITH MEALS. OVERDUE FOR APPOINTMENT What changed:  how much to take how to take this when to take this additional instructions   Insulin Pen Needle 32G X 4 MM Misc Use to inject insulin 4 times a day   loratadine 5 MG/5ML syrup Commonly known as: CLARITIN Take 5 mg by mouth daily as needed for allergies or rhinitis.   metolazone 5 MG tablet Commonly known as: ZAROXOLYN Take 5 mg by mouth daily as needed (for extreme swelling).   metoprolol tartrate 100 MG tablet Commonly known as: LOPRESSOR TAKE 1 TABLET BY MOUTH TWICE A DAY   PRESCRIPTION MEDICATION CPAP: At bedtime   rosuvastatin 20 MG tablet Commonly known as: CRESTOR TAKE 1 TABLET BY MOUTH EVERY DAY   torsemide 20 MG tablet Commonly known as: DEMADEX Take 40 mg by mouth daily.   Vitamin D3 50 MCG (2000 UT) Tabs Take 2,000 Units by mouth daily with breakfast.           Outstanding Labs/Studies   Bmet and echo in 1 week.   Duration of Discharge Encounter   Greater than 30 minutes including physician time.  Signed, Darreld Mclean, PA-C 01/19/2019, 2:02 PM  Agree with note by Sande Rives, PA-C  Mr. Morrish is stable for discharge today.  He was admitted on the cover this past weekend with increasing shortness of breath.  He does have chronic renal insufficiency was noted to have a large circumferential pericardial effusion by 2D echo though not frank tamponade.  Dr. Claiborne Billings attempted subxiphoid pericardiocentesis unsuccessfully on 01/16/2019 and he ultimately underwent subxiphoid pericardial window by Dr. Kipp Brood with removal of 750 cc of hemorrhagic fluid which was negative for malignancy.  He is remained hemodynamically stable.  He feels clinically improved.  His peripheral edema has improved as well.  His JP drain came out  today and Dr. Kipp Brood felt he was stable for discharge.  We will continue his diuretics as well as his other antihypertensive  medications.  We will get a 2D echo in 1 week and then he will see me back in 2 to 3 weeks in the office.   Lorretta Harp, M.D., [facility], [facility], [place] [address] [place],   [address]  [phone] 01/19/2019 2:11 PM

## 2019-01-19 NOTE — Progress Notes (Addendum)
[facility] DAILY ICU PROGRESS NOTE                   [address]            [place] [address]          [phone]   2 Days Post-Op Procedure(s) (LRB): VIDEO ASSISTED THORACOSCOPY, pericardial window for pericardium and pericardial fluid. (Right)  Total Length of Stay:  LOS: 4 days   Subjective:  No complaints.  States pain is controlled.  Feels like he is breathing better since his procedure.  Objective: Vital signs in last 24 hours: Temp:  [97.8 F (36.6 C)-98.9 F (37.2 C)] 98 F (36.7 C) (12/24 0640) Pulse Rate:  [60-77] 60 (12/24 0700) Cardiac Rhythm: Normal sinus rhythm;Bundle branch block;Heart block (12/24 0400) Resp:  [15-22] 15 (12/24 0700) BP: (131-166)/(60-81) 147/81 (12/24 0700) SpO2:  [93 %-99 %] 93 % (12/24 0700) Arterial Line BP: (168-201)/(63-67) 168/67 (12/23 0813)  Filed Weights   01/17/19 0600  Weight: 124.7 kg    Weight change:    Intake/Output from previous day: 12/23 0701 - 12/24 0700 In: 720 [P.O.:720] Out: 120 [Chest Tube:120]  Intake/Output this shift: No intake/output data recorded.  Current Meds: Scheduled Meds: . acetaminophen  1,000 mg Oral Q6H   Or  . acetaminophen (TYLENOL) oral liquid 160 mg/5 mL  1,000 mg Oral Q6H  . albuterol  5 mg Nebulization Once  . bisacodyl  10 mg Oral Daily  . calcitRIOL  0.5 mcg Oral Daily  . Chlorhexidine Gluconate Cloth  6 each Topical Daily  . cholecalciferol  2,000 Units Oral Q breakfast  . doxazosin  4 mg Oral QHS  . enoxaparin (LOVENOX) injection  40 mg Subcutaneous Daily  . ferrous sulfate  650 mg Oral Q breakfast  . hydrALAZINE  100 mg Oral Q8H  . insulin aspart  0-20 Units Subcutaneous TID WC  . insulin aspart  0-5 Units Subcutaneous QHS  . insulin glargine  35 Units Subcutaneous QHS  . metoprolol tartrate  100 mg Oral BID  . rosuvastatin  20 mg Oral Daily  . senna-docusate  1 tablet Oral QHS   Continuous Infusions: . sodium chloride Stopped (01/17/19 2255)   PRN  Meds:.Place/Maintain arterial line **AND** sodium chloride, fentaNYL (SUBLIMAZE) injection, ondansetron (ZOFRAN) IV, oxyCODONE, traMADol  General appearance: alert, cooperative and no distress Heart: regular rate and rhythm Lungs: clear to auscultation bilaterally Abdomen: soft, non-tender; bowel sounds normal; no masses,  no organomegaly Wound: clean and dry  Lab Results: CBC: Recent Labs    01/18/19 0759 01/19/19 0242  WBC 11.9* 10.2  HGB 10.8* 9.9*  HCT 33.4* 30.8*  PLT 139* 138*   BMET:  Recent Labs    01/18/19 0759 01/19/19 0242  NA 137 138  K 3.9 3.8  CL 105 107  CO2 21* 23  GLUCOSE 367* 245*  BUN 66* 76*  CREATININE 3.12* 3.20*  CALCIUM 8.4* 8.4*    CMET: Lab Results  Component Value Date   WBC 10.2 01/19/2019   HGB 9.9 (L) 01/19/2019   HCT 30.8 (L) 01/19/2019   PLT 138 (L) 01/19/2019   GLUCOSE 245 (H) 01/19/2019   CHOL 92 01/16/2019   TRIG 216 (H) 01/16/2019   HDL 23 (L) 01/16/2019   LDLDIRECT 36.0 06/28/2015   LDLCALC 26 01/16/2019   ALT 11 01/19/2019   AST 16 01/19/2019   NA 138 01/19/2019   K 3.8 01/19/2019   CL 107 01/19/2019   CREATININE 3.20 (  H) 01/19/2019   BUN 76 (H) 01/19/2019   CO2 23 01/19/2019   TSH 0.83 11/25/2012   PSA 1.12 08/26/2018   INR 1.1 01/16/2019   HGBA1C 9.7 (H) 01/16/2019   MICROALBUR 167.1 (H) 06/26/2011      PT/INR:  Recent Labs    01/16/19 2132  LABPROT 14.4  INR 1.1   Radiology: No results found.   Assessment/Plan: S/P Procedure(s) (LRB): VIDEO ASSISTED THORACOSCOPY, pericardial window for pericardium and pericardial fluid. (Right)  1. S/P Pericardial Window- JP drain with minimal fluid in bulb, 120 cc output yesterday, will likely leave in place 1 more day, will defer to Dr. Kipp Brood 2. Pathology- shows inflammation, no evidence of malignancy, cytology shows reactive mesothelial cells 3. Care per Cardiology     Ellwood Handler, PA-C  01/19/2019 7:49 AM   Doing well Will remove drain today Ok  for discharge once drain out.  Nalina Yeatman Bary Leriche

## 2019-01-19 NOTE — Discharge Instructions (Signed)
Thoracoscopy, Care After This sheet gives you information about how to care for yourself after your procedure. Your health care provider may also give you more specific instructions. If you have problems or questions, contact your health care provider. What can I expect after the procedure? After the procedure, it is common to have pain and soreness in the surgical area. Follow these instructions at home: Incision care   Follow instructions from your health care provider about how to take care of your incision. Make sure you: ? Wash your hands with soap and water before you change your bandage (dressing). If soap and water are not available, use hand sanitizer. ? Change your dressing as told by your health care . Remove dressing in 24 Remove dressing in 24 hours. ? Leave stitches (sutures), skin glue, or adhesive strips in place. These skin closures may need to stay in place for 2 weeks or longer. If adhesive strip edges start to loosen and curl up, you may trim the loose edges. Do not remove adhesive strips completely unless your health care provider tells you to do that.  Check your incision areas every day for signs of infection. Check for: ? Redness, swelling, or pain. ? Fluid or blood. ? Warmth. ? Pus or a bad smell.  Do not take baths, swim, or use a hot tub until your health care provider approves. You may take shower in 24 hours. Medicines  Take over-the-counter and prescription medicines only as told by your health care provider.  If you were prescribed an antibiotic medicine, take it as told by your health care provider. Do not stop taking the antibiotic even if you start to feel better.  Do not drive or use heavy machinery while taking prescription pain medicine.  If you are taking prescription pain medicine, take actions to prevent or treat constipation. Your health care provider may recommend that you: ? Drink enough fluid to keep your urine pale yellow. ? Eat foods that  are high in fiber, such as fresh fruits and vegetables, whole grains, and beans. ? Limit foods that are high in fat and processed sugars, such as fried and sweet foods. ? Take an over-the-counter or prescription medicine for constipation. Managing pain, stiffness, and swelling   If directed, put ice on the affected area: ? Put ice in a plastic bag. ? Place a towel between your skin and the bag. ? Leave the ice on for 20 minutes, 2-3 times a day. Preventing lung infection  To prevent pneumonia and to keep your lungs healthy: ? Try to cough often. If it hurts to cough, hold a pillow against your chest as you cough. ? Take deep breaths or do breathing exercises as instructed by your health care provider. ? If you were given an incentive spirometer, use it as directed by your health care provider. General instructions  Do not lift anything that is heavier than 10 lb (4.5 kg), or the limit that you are told, until your health care provider says that it is safe.  Do not use any products that contain nicotine or tobacco, such as cigarettes and e-cigarettes. These can delay healing after surgery. If you need help quitting, ask your health care provider.  Avoid driving until your health care provider approves.  If you have a chest drainage tube, care for it as instructed by your health care provider. Do not travel by airplane after the chest drainage tube is removed until your health care provider approves.  Keep all follow-up  visits as told by your health care provider. This is important. Contact a health care provider if:  You have a fever.  Pain medicines do not ease your pain.  You have redness, swelling, or increasing pain in your incision area.  You develop a cough that does not go away, or you are coughing up mucus that is yellow or green. Get help right away if:  You have fluid, blood, or pus coming from your incision.  There is a bad smell coming from your incision or  dressing.  You develop a rash.  You cough up blood.  You develop light-headedness, or you feel faint.  You have difficulty breathing.  You develop chest pain.  Your heartbeat feels irregular or very fast. These symptoms may represent a serious problem that is an emergency. Do not wait to see if the symptoms will go away. Get medical help right away. Call your local emergency services (911 in the [place] Do not drive yourself to the hospital. Summary  Follow instructions from your health care provider about how to take care of your incision.  Do not drive or use heavy machinery while taking prescription pain medicine.  Leave stitches (sutures), skin glue, or adhesive strips in place.  Check your incision areas every day for signs of infection. This information is not intended to replace advice given to you by your health care provider. Make sure you discuss any questions you have with your health care provider. Document Released: 08/01/2004 Document Revised: 12/25/2016 Document Reviewed: 12/22/2016 Elsevier Patient Education  2020 [employer].

## 2019-01-20 LAB — BPAM RBC
Blood Product Expiration Date: 202101172359
Blood Product Expiration Date: 202101172359
Blood Product Expiration Date: 202101182359
Blood Product Expiration Date: 202101192359
ISSUE DATE / TIME: 202012211712
ISSUE DATE / TIME: 202012221009
ISSUE DATE / TIME: 202012221009
Unit Type and Rh: 5100
Unit Type and Rh: 5100
Unit Type and Rh: 5100
Unit Type and Rh: 5100

## 2019-01-20 LAB — TYPE AND SCREEN
ABO/RH(D): O POS
Antibody Screen: NEGATIVE
Unit division: 0
Unit division: 0
Unit division: 0
Unit division: 0

## 2019-01-22 LAB — CULTURE, BODY FLUID W GRAM STAIN -BOTTLE: Culture: NO GROWTH

## 2019-01-23 ENCOUNTER — Telehealth: Payer: Self-pay

## 2019-01-23 NOTE — Telephone Encounter (Signed)
Following up with cardiology, they will complete TCM.

## 2019-01-25 ENCOUNTER — Other Ambulatory Visit: Payer: Self-pay | Admitting: Thoracic Surgery (Cardiothoracic Vascular Surgery)

## 2019-01-25 DIAGNOSIS — Z9889 Other specified postprocedural states: Secondary | ICD-10-CM

## 2019-01-30 ENCOUNTER — Ambulatory Visit (HOSPITAL_COMMUNITY)
Admission: RE | Admit: 2019-01-30 | Discharge: 2019-01-30 | Disposition: A | Discharge status: Home / Self Care | Payer: 59 | Source: Ambulatory Visit | Attending: Cardiology | Admitting: Cardiology

## 2019-01-30 ENCOUNTER — Other Ambulatory Visit: Payer: Self-pay

## 2019-01-30 ENCOUNTER — Other Ambulatory Visit (HOSPITAL_COMMUNITY): Payer: Self-pay | Admitting: Radiology

## 2019-01-30 DIAGNOSIS — R0989 Other specified symptoms and signs involving the circulatory and respiratory systems: Secondary | ICD-10-CM

## 2019-01-31 ENCOUNTER — Encounter: Payer: Self-pay | Admitting: Thoracic Surgery (Cardiothoracic Vascular Surgery)

## 2019-01-31 ENCOUNTER — Ambulatory Visit (INDEPENDENT_AMBULATORY_CARE_PROVIDER_SITE_OTHER): Payer: Self-pay | Admitting: Thoracic Surgery (Cardiothoracic Vascular Surgery)

## 2019-01-31 VITALS — BP 166/73 | HR 62 | Temp 97.3°F | Resp 16 | Ht 71.5 in | Wt 280.0 lb

## 2019-01-31 DIAGNOSIS — Z09 Encounter for follow-up examination after completed treatment for conditions other than malignant neoplasm: Secondary | ICD-10-CM

## 2019-01-31 DIAGNOSIS — I313 Pericardial effusion (noninflammatory): Secondary | ICD-10-CM

## 2019-01-31 DIAGNOSIS — Z9889 Other specified postprocedural states: Secondary | ICD-10-CM

## 2019-01-31 DIAGNOSIS — I3139 Other pericardial effusion (noninflammatory): Secondary | ICD-10-CM

## 2019-01-31 NOTE — Progress Notes (Signed)
      [address]       [place], [address]             [phone]        [place] Medical Record #[id number] Date of Birth: March 17, 1960  Referring: Lorretta Harp, MD Primary Care: [name], MD Primary Cardiologist:Jonathan [name], MD  Reason for visit:   follow-up  History of Present Illness:     Doing well has no complaints  Physical Exam: BP (!) 166/73 (BP Location: Left Arm, Patient Position: Sitting, Cuff Size: Large)   Pulse 62   Temp (!) 97.3 F (36.3 C)   Resp 16   Ht 5' 11.5" (1.816 m)   Wt 280 lb (127 kg)   SpO2 96% Comment: RA  BMI 38.51 kg/m   Alert NAD Incision clean, well healed Clear B Abdomen soft, ND no peripheral edema   Diagnostic Studies & Laboratory data:  Path:  FINAL MICROSCOPIC DIAGNOSIS:   A. PERICARDIUM, BIOPSY:  - Reactive pericardium with mild chronic inflammation  - No malignancy identified  Cultures negative Fungal cultures pending     Assessment / Plan:   59 yo male s/p R VATS pericardial window doing well F/u PRN   Lajuana Matte 01/31/2019 11:42 AM

## 2019-02-01 ENCOUNTER — Other Ambulatory Visit: Payer: Self-pay

## 2019-02-01 ENCOUNTER — Encounter: Payer: Self-pay | Admitting: Cardiovascular Disease

## 2019-02-01 ENCOUNTER — Ambulatory Visit: Payer: 59 | Admitting: Cardiovascular Disease

## 2019-02-01 ENCOUNTER — Ambulatory Visit (HOSPITAL_COMMUNITY): Payer: 59 | Attending: Cardiovascular Disease

## 2019-02-01 DIAGNOSIS — I3139 Other pericardial effusion (noninflammatory): Secondary | ICD-10-CM

## 2019-02-01 DIAGNOSIS — Z9889 Other specified postprocedural states: Secondary | ICD-10-CM | POA: Diagnosis not present

## 2019-02-01 DIAGNOSIS — I313 Pericardial effusion (noninflammatory): Secondary | ICD-10-CM | POA: Insufficient documentation

## 2019-02-01 DIAGNOSIS — R600 Localized edema: Secondary | ICD-10-CM | POA: Insufficient documentation

## 2019-02-01 DIAGNOSIS — I10 Essential (primary) hypertension: Secondary | ICD-10-CM | POA: Diagnosis not present

## 2019-02-01 DIAGNOSIS — E782 Mixed hyperlipidemia: Secondary | ICD-10-CM | POA: Diagnosis not present

## 2019-02-01 LAB — ECHOCARDIOGRAM LIMITED
Height: 72 in
Weight: 4480 oz
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
## 2019-02-01 NOTE — Assessment & Plan Note (Signed)
History of essential hypertension a blood pressure measured today at 162/75.  He is on amlodipine, hydralazine, doxazosin and metoprolol.  He does admit to dietary discretion with regards to salt.  He restarted his torsemide this morning.  I am asking him to keep tabs on his blood pressure at home with his home blood pressure cuff.

## 2019-02-01 NOTE — Assessment & Plan Note (Addendum)
History of large pericardial effusion with pretamponade physiology status post pericardial window with drainage by Dr. Kipp Brood.  Dr. Kipp Brood saw him yesterday and released him.  His symptoms have markedly improved.  His lateral scar has healed.  He is scheduled for follow-up 2D echo later this morning.  Pathology of the pericardial fluid came back nonmalignant and no growth.

## 2019-02-01 NOTE — Patient Instructions (Signed)
Medication Instructions:  Your physician recommends that you continue on your current medications as directed. Please refer to the Current Medication list given to you today.  If you need a refill on your cardiac medications before your next appointment, please call your pharmacy.   Lab work: NONE  Testing/Procedures: NONE  Follow-Up: At [facility], you and your health needs are our priority.  As part of our continuing mission to provide you with exceptional heart care, we have created designated Provider Care Teams.  These Care Teams include your primary Cardiologist (physician) and Advanced Practice Providers (APPs -  Physician Assistants and Nurse Practitioners) who all work together to provide you with the care you need, when you need it. You may see Quay Burow, MD or one of the following Advanced Practice Providers on your designated Care Team:    Kerin Ransom, PA-C  [place], [place]  Coletta Memos, [place]  Your physician wants you to follow-up in: 3 months with a Physicians Assistant  Your physician wants you to follow-up in: 6 months with Dr [name]

## 2019-02-01 NOTE — Assessment & Plan Note (Signed)
History of hyperlipidemia on statin therapy with lipid profile performed 01/16/2019 revealed a total cholesterol of 92, LDL 26 and HDL 23.

## 2019-02-01 NOTE — Assessment & Plan Note (Signed)
2+ edema on exam today.  His torsemide was held during his hospitalization and was restarted yesterday.  He is aware of salt restriction.  He scheduled to see his nephrologist tomorrow.

## 2019-02-03 ENCOUNTER — Telehealth: Payer: Self-pay | Admitting: Cardiovascular Disease

## 2019-02-03 NOTE — Telephone Encounter (Signed)
Patient returning christine's call in regards to echo results.

## 2019-02-03 NOTE — Telephone Encounter (Signed)
Returned pts call regarding his echo results. No answer. LMTCB

## 2019-02-11 ENCOUNTER — Encounter (HOSPITAL_COMMUNITY): Payer: Self-pay

## 2019-02-11 ENCOUNTER — Ambulatory Visit (INDEPENDENT_AMBULATORY_CARE_PROVIDER_SITE_OTHER): Payer: 59

## 2019-02-11 ENCOUNTER — Ambulatory Visit (HOSPITAL_COMMUNITY)
Admission: EM | Admit: 2019-02-11 | Discharge: 2019-02-11 | Disposition: A | Discharge status: Home / Self Care | Payer: 59 | Attending: Physician Assistant | Admitting: Physician Assistant

## 2019-02-11 ENCOUNTER — Other Ambulatory Visit: Payer: Self-pay

## 2019-02-11 DIAGNOSIS — L089 Local infection of the skin and subcutaneous tissue, unspecified: Secondary | ICD-10-CM | POA: Insufficient documentation

## 2019-02-11 DIAGNOSIS — E11628 Type 2 diabetes mellitus with other skin complications: Secondary | ICD-10-CM

## 2019-02-11 LAB — CBC
HCT: 38.9 % — ABNORMAL LOW (ref 39.0–52.0)
Hemoglobin: 12.6 g/dL — ABNORMAL LOW (ref 13.0–17.0)
MCH: 28 pg (ref 26.0–34.0)
MCHC: 32.4 g/dL (ref 30.0–36.0)
MCV: 86.4 fL (ref 80.0–100.0)
Platelets: 197 10*3/uL (ref 150–400)
RBC: 4.5 MIL/uL (ref 4.22–5.81)
RDW: 13.2 % (ref 11.5–15.5)
WBC: 7.3 10*3/uL (ref 4.0–10.5)
nRBC: 0 % (ref 0.0–0.2)

## 2019-02-11 MED ORDER — CEPHALEXIN 250 MG PO CAPS: 250.0000 mg | ORAL_CAPSULE | Freq: Three times a day (TID) | ORAL | 0 refills | Status: AC

## 2019-02-11 MED ORDER — DOXYCYCLINE HYCLATE 100 MG PO CAPS: 100.0000 mg | ORAL_CAPSULE | Freq: Two times a day (BID) | ORAL | 0 refills | Status: AC

## 2019-02-11 MED ORDER — DOXYCYCLINE HYCLATE 100 MG PO CAPS: 100.0000 mg | ORAL_CAPSULE | Freq: Two times a day (BID) | ORAL | 0 refills | Status: DC

## 2019-02-11 NOTE — ED Triage Notes (Signed)
Pt present a infected spot on the bottom of his foot. Pt state that he noticed this last night. There has been some drainage from the infected spot
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
## 2019-02-11 NOTE — Discharge Instructions (Signed)
I have sent in two antibiotics. Please take them as prescribed  Schedule an appointment to be seen by your primary care as soon as possible.   Ensure you are checking your feet often for ulcers and calluses. Keep them clean  If you notice worsening drainage, swelling or pain, develop a fever or chills, please be re-evaluated at urgent care or the emergency department.

## 2019-02-13 ENCOUNTER — Other Ambulatory Visit: Payer: Self-pay

## 2019-02-13 ENCOUNTER — Encounter: Payer: Self-pay | Admitting: Internal Medicine

## 2019-02-13 ENCOUNTER — Encounter: Payer: Self-pay | Admitting: Podiatry

## 2019-02-13 ENCOUNTER — Ambulatory Visit: Payer: 59 | Admitting: Podiatry

## 2019-02-13 ENCOUNTER — Ambulatory Visit (INDEPENDENT_AMBULATORY_CARE_PROVIDER_SITE_OTHER): Payer: 59 | Admitting: Internal Medicine

## 2019-02-13 DIAGNOSIS — E11621 Type 2 diabetes mellitus with foot ulcer: Secondary | ICD-10-CM

## 2019-02-13 DIAGNOSIS — L97509 Non-pressure chronic ulcer of other part of unspecified foot with unspecified severity: Secondary | ICD-10-CM | POA: Insufficient documentation

## 2019-02-13 DIAGNOSIS — E08621 Diabetes mellitus due to underlying condition with foot ulcer: Secondary | ICD-10-CM

## 2019-02-13 DIAGNOSIS — L97502 Non-pressure chronic ulcer of other part of unspecified foot with fat layer exposed: Secondary | ICD-10-CM

## 2019-02-13 DIAGNOSIS — L97522 Non-pressure chronic ulcer of other part of left foot with fat layer exposed: Secondary | ICD-10-CM | POA: Diagnosis not present

## 2019-02-13 DIAGNOSIS — E0843 Diabetes mellitus due to underlying condition with diabetic autonomic (poly)neuropathy: Secondary | ICD-10-CM

## 2019-02-13 MED ORDER — GENTAMICIN SULFATE 0.1 % EX CREA: 1.0000 "application " | TOPICAL_CREAM | Freq: Two times a day (BID) | CUTANEOUS | 1 refills | Status: DC

## 2019-02-13 NOTE — Assessment & Plan Note (Addendum)
Superficial ulcer under 5th MTP and laterally Mild inflammation Callous completely removed Redressed with gentamicin ointment and non stick cling/coban Will continue the cephalexin/doxycycline to finish the 1 week course--and extend it if necessary
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
## 2019-02-16 LAB — FUNGAL ORGANISM REFLEX

## 2019-02-16 LAB — WOUND CULTURE
MICRO NUMBER:: 10052508
RESULT:: NO GROWTH
SPECIMEN QUALITY:: ADEQUATE

## 2019-02-16 LAB — FUNGUS CULTURE WITH STAIN

## 2019-02-16 LAB — FUNGUS CULTURE RESULT

## 2019-02-16 NOTE — Progress Notes (Signed)
   Subjective:  59 y.o. male with PMHx of diabetes mellitus with a chief complaint of an ulceration of the left sub-fifth MPJ that he noticed a few days ago. He was seen at [facility] and was prescribed Doxycycline and Keflex which he reports taking as directed. He denies any current pain, fever or chills. There are no worsening factors noted. Patient is here for further evaluation and treatment.    Past Medical History:  Diagnosis Date  . Chronic venous insufficiency   . CKD (chronic kidney disease), stage III   . Diabetes mellitus type II 2003  . HLD (hyperlipidemia)   . HTN (hypertension) 1980's  . OSA (obstructive sleep apnea)   . 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 Sleep apnea       Objective/Physical Exam General: The patient is alert and oriented x3 in no acute distress.  Dermatology:  Wound #1 noted to the left sub-fifth MPJ measuring approximately 1.5 x 1.5 x 0.2 cm (LxWxD).   To the noted ulceration(s), there is no eschar. There is a moderate amount of slough, fibrin, and necrotic tissue noted. Granulation tissue and wound base is red. There is a minimal amount of serosanguineous drainage noted. There is no exposed bone muscle-tendon ligament or joint. There is no malodor. Periwound integrity is intact. Skin is warm, dry and supple bilateral lower extremities.  Vascular: Palpable pedal pulses bilaterally. No edema or erythema noted. Capillary refill within normal limits.  Neurological: Epicritic and protective threshold diminished bilaterally.   Musculoskeletal Exam: Range of motion within normal limits to all pedal and ankle joints bilateral. Muscle strength 5/5 in all groups bilateral.   Assessment: 1. Ulceration of the left sub-fifth MPJ secondary to diabetes mellitus 2. diabetes mellitus w/ peripheral neuropathy   Plan of Care:  1. Patient was evaluated. 2. medically necessary excisional debridement including subcutaneous tissue  was performed using a tissue nipper and a chisel blade. Excisional debridement of all the necrotic nonviable tissue down to healthy bleeding viable tissue was performed with post-debridement measurements same as pre-. 3. the wound was cleansed and dry sterile dressing applied. 4. Prescription for Gentamicin cream provided to patient to use daily with a bandage.  5. Culture taken from wound.  6. Finish taking oral antibiotics as prescribed.  7. CAM boot dispensed.  8. Note for work provided to patient for light duty only.  9. Return to clinic in 3 weeks.   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    Edrick Kins, DPM [facility]  Dr. Edrick Kins, [address] [place]                                        [place], [place] [address]                Office [phone]  Fax [phone]

## 2019-02-27 ENCOUNTER — Encounter: Payer: Self-pay | Admitting: Podiatry

## 2019-02-27 ENCOUNTER — Ambulatory Visit (INDEPENDENT_AMBULATORY_CARE_PROVIDER_SITE_OTHER): Payer: 59 | Admitting: Podiatry

## 2019-02-27 ENCOUNTER — Other Ambulatory Visit: Payer: Self-pay

## 2019-02-27 DIAGNOSIS — E0843 Diabetes mellitus due to underlying condition with diabetic autonomic (poly)neuropathy: Secondary | ICD-10-CM

## 2019-02-27 DIAGNOSIS — L97522 Non-pressure chronic ulcer of other part of left foot with fat layer exposed: Secondary | ICD-10-CM | POA: Diagnosis not present

## 2019-02-27 MED ORDER — GENTAMICIN SULFATE 0.1 % EX CREA: 1.0000 "application " | TOPICAL_CREAM | Freq: Two times a day (BID) | CUTANEOUS | 1 refills | Status: DC

## 2019-03-02 NOTE — Progress Notes (Signed)
   Subjective:  59 y.o. male with PMHx of diabetes mellitus presenting today for follow up evaluation of an ulceration of the left sub-fifth MPJ. He states he is doing well and is significantly improving. He has been using Gentamicin cream and the CAM boot as directed. There are no worsening factors noted. Patient is here for further evaluation and treatment.    Past Medical History:  Diagnosis Date  . Chronic venous insufficiency   . CKD (chronic kidney disease), stage III   . Diabetes mellitus type II 2003  . HLD (hyperlipidemia)   . HTN (hypertension) 1980's  . OSA (obstructive sleep apnea)   . 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 Sleep apnea       Objective/Physical Exam General: The patient is alert and oriented x3 in no acute distress.  Dermatology:  Wound #1 noted to the left sub-fifth MPJ measuring approximately 0.5 x 0.1 x 0.1 cm (LxWxD).   To the noted ulceration(s), there is no eschar. There is a moderate amount of slough, fibrin, and necrotic tissue noted. Granulation tissue and wound base is red. There is a minimal amount of serosanguineous drainage noted. There is no exposed bone muscle-tendon ligament or joint. There is no malodor. Periwound integrity is intact. Skin is warm, dry and supple bilateral lower extremities.  Vascular: Palpable pedal pulses bilaterally. No edema or erythema noted. Capillary refill within normal limits.  Neurological: Epicritic and protective threshold diminished bilaterally.   Musculoskeletal Exam: Range of motion within normal limits to all pedal and ankle joints bilateral. Muscle strength 5/5 in all groups bilateral.   Assessment: 1. Ulceration of the left sub-fifth MPJ secondary to diabetes mellitus 2. diabetes mellitus w/ peripheral neuropathy   Plan of Care:  1. Patient was evaluated. 2. medically necessary excisional debridement including subcutaneous tissue was performed using a tissue nipper and a  chisel blade. Excisional debridement of all the necrotic nonviable tissue down to healthy bleeding viable tissue was performed with post-debridement measurements same as pre-. 3. the wound was cleansed and dry sterile dressing applied. 4. Continue using Gentamicin cream daily with a bandage.  5. Continue using CAM boot.  6. Continue light duty at work.  7. Return to clinic in 2 weeks for potential return to work full duty.    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    Edrick Kins, DPM [facility]  Dr. Edrick Kins, [address] [place]                                        [place], [place] [address]                Office [phone]  Fax [phone]

## 2019-03-07 ENCOUNTER — Other Ambulatory Visit: Payer: Self-pay

## 2019-03-07 ENCOUNTER — Other Ambulatory Visit: Payer: Self-pay | Admitting: Internal Medicine

## 2019-03-07 DIAGNOSIS — N184 Chronic kidney disease, stage 4 (severe): Secondary | ICD-10-CM

## 2019-03-08 ENCOUNTER — Ambulatory Visit (HOSPITAL_COMMUNITY)
Admission: RE | Admit: 2019-03-08 | Discharge: 2019-03-08 | Disposition: A | Discharge status: Home / Self Care | Payer: 59 | Source: Ambulatory Visit | Attending: Vascular Surgery | Admitting: Vascular Surgery

## 2019-03-08 ENCOUNTER — Other Ambulatory Visit: Payer: Self-pay

## 2019-03-08 ENCOUNTER — Encounter: Payer: Self-pay | Admitting: Vascular Surgery

## 2019-03-08 ENCOUNTER — Ambulatory Visit: Payer: 59 | Admitting: Vascular Surgery

## 2019-03-08 ENCOUNTER — Ambulatory Visit (INDEPENDENT_AMBULATORY_CARE_PROVIDER_SITE_OTHER)
Admission: RE | Admit: 2019-03-08 | Discharge: 2019-03-08 | Disposition: A | Discharge status: Home / Self Care | Payer: 59 | Source: Ambulatory Visit | Attending: Vascular Surgery | Admitting: Vascular Surgery

## 2019-03-08 VITALS — BP 141/71 | HR 63 | Temp 97.3°F | Resp 18 | Ht 72.0 in | Wt 276.9 lb

## 2019-03-08 DIAGNOSIS — N185 Chronic kidney disease, stage 5: Secondary | ICD-10-CM

## 2019-03-08 DIAGNOSIS — N184 Chronic kidney disease, stage 4 (severe): Secondary | ICD-10-CM
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
## 2019-03-10 ENCOUNTER — Other Ambulatory Visit: Payer: Self-pay

## 2019-03-13 ENCOUNTER — Ambulatory Visit (AMBULATORY_SURGERY_CENTER): Payer: Self-pay | Admitting: *Deleted

## 2019-03-13 ENCOUNTER — Other Ambulatory Visit: Payer: Self-pay

## 2019-03-13 VITALS — Temp 97.1°F | Ht 71.5 in | Wt 284.0 lb

## 2019-03-13 DIAGNOSIS — Z01818 Encounter for other preprocedural examination: Secondary | ICD-10-CM

## 2019-03-13 DIAGNOSIS — Z86010 Personal history of colonic polyps: Secondary | ICD-10-CM

## 2019-03-13 MED ORDER — SUPREP BOWEL PREP KIT 17.5-3.13-1.6 GM/177ML PO SOLN: 1.0000 | Freq: Once | ORAL | 0 refills | Status: AC

## 2019-03-13 NOTE — Progress Notes (Signed)
No egg or soy allergy known to patient  No issues with past sedation with any surgeries  or procedures, no intubation problems  No diet pills per patient No home 02 use per patient  No blood thinners per patient  Pt denies issues with constipation  No A fib or A flutter  EMMI video sent to pt's e mail   Due to the COVID-19 pandemic we are asking patients to follow these guidelines. Please only bring one care partner. Please be aware that your care partner may wait in the car in the parking lot or if they feel like they will be too hot to wait in the car, they may wait in the lobby on the 4th floor. All care partners are required to wear a mask the entire time (we do not have any that we can provide them), they need to practice social distancing, and we will do a Covid check for all patient's and care partners when you arrive. Also we will check their temperature and your temperature. If the care partner waits in their car they need to stay in the parking lot the entire time and we will call them on their cell phone when the patient is ready for discharge so they can bring the car to the front of the building. Also all patient's will need to wear a mask into building. Suprep $15 coupon

## 2019-03-14 ENCOUNTER — Ambulatory Visit: Payer: 59 | Admitting: Internal Medicine

## 2019-03-14 ENCOUNTER — Encounter: Payer: Self-pay | Admitting: Internal Medicine

## 2019-03-14 VITALS — BP 130/78 | HR 64 | Ht 71.5 in | Wt 281.0 lb

## 2019-03-14 DIAGNOSIS — E785 Hyperlipidemia, unspecified: Secondary | ICD-10-CM

## 2019-03-14 DIAGNOSIS — Z794 Long term (current) use of insulin: Secondary | ICD-10-CM | POA: Diagnosis not present

## 2019-03-14 DIAGNOSIS — N183 Chronic kidney disease, stage 3 unspecified: Secondary | ICD-10-CM

## 2019-03-14 DIAGNOSIS — E1165 Type 2 diabetes mellitus with hyperglycemia: Secondary | ICD-10-CM

## 2019-03-14 DIAGNOSIS — E669 Obesity, unspecified: Secondary | ICD-10-CM

## 2019-03-14 DIAGNOSIS — IMO0002 Reserved for concepts with insufficient information to code with codable children: Secondary | ICD-10-CM

## 2019-03-14 DIAGNOSIS — E1122 Type 2 diabetes mellitus with diabetic chronic kidney disease: Secondary | ICD-10-CM | POA: Diagnosis not present

## 2019-03-14 LAB — POCT GLYCOSYLATED HEMOGLOBIN (HGB A1C): Hemoglobin A1C: 9.9 % — AB (ref 4.0–5.6)

## 2019-03-14 MED ORDER — FREESTYLE LIBRE 2 READER DEVI: 1.0000 | Freq: Every day | 0 refills | Status: DC

## 2019-03-14 MED ORDER — OZEMPIC (0.25 OR 0.5 MG/DOSE) 2 MG/1.5ML ~~LOC~~ SOPN: 0.5000 mg | PEN_INJECTOR | SUBCUTANEOUS | 3 refills | Status: DC

## 2019-03-14 MED ORDER — FREESTYLE LIBRE 2 SENSOR MISC: 1.0000 | 3 refills | Status: DC

## 2019-03-14 NOTE — Patient Instructions (Addendum)
Please continue: - Basaglar 65 units at bedtime - Mealtime insulin 10-20 units before meals.  Please start Ozempic 0.25 mg weekly in a.m. (for example on Sunday morning) x 4 weeks, then increase to 0.5 mg weekly in a.m. if no nausea or hypoglycemia.  Please return in 3 months with your sugar log.

## 2019-03-14 NOTE — Addendum Note (Signed)
Addended by: Cardell Peach I on: 03/14/2019 11:01 AM   Modules accepted: Orders
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
## 2019-03-15 ENCOUNTER — Other Ambulatory Visit: Payer: Self-pay

## 2019-03-15 ENCOUNTER — Encounter: Payer: Self-pay | Admitting: Podiatry

## 2019-03-15 ENCOUNTER — Ambulatory Visit: Payer: 59 | Admitting: Podiatry

## 2019-03-15 DIAGNOSIS — M79676 Pain in unspecified toe(s): Secondary | ICD-10-CM

## 2019-03-15 DIAGNOSIS — B351 Tinea unguium: Secondary | ICD-10-CM | POA: Diagnosis not present

## 2019-03-15 DIAGNOSIS — L97522 Non-pressure chronic ulcer of other part of left foot with fat layer exposed: Secondary | ICD-10-CM | POA: Diagnosis not present

## 2019-03-15 DIAGNOSIS — E0843 Diabetes mellitus due to underlying condition with diabetic autonomic (poly)neuropathy: Secondary | ICD-10-CM | POA: Diagnosis not present

## 2019-03-16 LAB — ACID FAST CULTURE WITH REFLEXED SENSITIVITIES (MYCOBACTERIA): Acid Fast Culture: NEGATIVE

## 2019-03-17 ENCOUNTER — Other Ambulatory Visit (HOSPITAL_COMMUNITY): Payer: 59

## 2019-03-19 NOTE — Progress Notes (Signed)
   SUBJECTIVE 59 year old with a history of diabetes mellitus presents to office today for follow up evaluation of an ulceration of the left sub-fifth MPJ. He states he is doing well and believes the wound has healed. He has been using Gentamicin cream for treatment. There are no worsening factors noted.  He is also complaining of elongated, thickened nails that cause pain while ambulating in shoes. He is unable to trim his own nails. Patient is here for further evaluation and treatment.   Past Medical History:  Diagnosis Date  . Allergy   . Anemia   . Chronic venous insufficiency   . CKD (chronic kidney disease), stage III   . Diabetes mellitus type II 2003  . HLD (hyperlipidemia)   . HTN (hypertension) 1980's  . Neuromuscular disorder (HCC)    neuropathy feet   . OSA (obstructive sleep apnea)   . 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 Sleep apnea    wear cpap     OBJECTIVE General Patient is awake, alert, and oriented x 3 and in no acute distress.  Derm Skin is dry and supple bilateral. Negative open lesions or macerations. Remaining integument unremarkable. Nails are tender, long, thickened and dystrophic with subungual debris, consistent with onychomycosis, 1-5 bilateral. No signs of infection noted. Wound noted to the left sub-fifth MPJ has healed. Complete re-epithelialization has occurred. No drainage noted.   Vasc  DP and PT pedal pulses palpable bilaterally. Temperature gradient within normal limits.   Neuro Epicritic and protective threshold sensation diminished bilaterally.   Musculoskeletal Exam No symptomatic pedal deformities noted bilateral. Muscular strength within normal limits.  ASSESSMENT 1. Diabetes Mellitus w/ peripheral neuropathy 2. Onychomycosis of nail due to dermatophyte bilateral 3. Ulceration of the left sub-fifth MPJ secondary to diabetes mellitus - healed  4. diabetes mellitus w/ peripheral neuropathy  PLAN OF CARE 1.  Patient evaluated today. 2. Instructed to maintain good pedal hygiene and foot care. Stressed importance of controlling blood sugar.  3. Mechanical debridement of nails 1-5 bilaterally performed using a nail nipper. Filed with dremel without incident.  4. Offloading met pads dispensed.  5. Return to clinic in 3 mos.   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   Edrick Kins, DPM [facility]  Dr. Edrick Kins, [place]                                        [place],  [address]                Office [phone]  Fax [phone]

## 2019-03-20 ENCOUNTER — Ambulatory Visit (HOSPITAL_COMMUNITY): Admission: RE | Admit: 2019-03-20 | Payer: 59 | Source: Home / Self Care | Admitting: Vascular Surgery

## 2019-03-20 ENCOUNTER — Encounter (HOSPITAL_COMMUNITY): Admission: RE | Payer: Self-pay | Source: Home / Self Care

## 2019-03-20 SURGERY — ARTERIOVENOUS (AV) FISTULA CREATION
Anesthesia: Monitor Anesthesia Care | Laterality: Right

## 2019-03-21 ENCOUNTER — Other Ambulatory Visit: Payer: Self-pay

## 2019-03-21 ENCOUNTER — Ambulatory Visit: Payer: 59 | Admitting: Cardiovascular Disease

## 2019-03-21 ENCOUNTER — Encounter: Payer: Self-pay | Admitting: Cardiovascular Disease

## 2019-03-21 DIAGNOSIS — G4733 Obstructive sleep apnea (adult) (pediatric): Secondary | ICD-10-CM

## 2019-03-21 DIAGNOSIS — I10 Essential (primary) hypertension: Secondary | ICD-10-CM

## 2019-03-21 DIAGNOSIS — I313 Pericardial effusion (noninflammatory): Secondary | ICD-10-CM

## 2019-03-21 DIAGNOSIS — E782 Mixed hyperlipidemia: Secondary | ICD-10-CM | POA: Diagnosis not present

## 2019-03-21 DIAGNOSIS — I3139 Other pericardial effusion (noninflammatory): Secondary | ICD-10-CM

## 2019-03-21 NOTE — Assessment & Plan Note (Signed)
History of large circumferential pericardial effusion status post pericardiectomy by Dr. Kipp Brood using a small right intercostal incision draining 750 cc of bloody fluid which turned out to be nonmalignant with no growth.  He felt clinically improved and was discharged several days later.  Follow-up echo performed 02/01/2019 revealed no evidence of pericardial effusion with normal LV function.

## 2019-03-21 NOTE — Assessment & Plan Note (Signed)
History of hyperlipidemia on statin therapy with lipid profile performed 01/16/2019 revealing total cholesterol of 92, LDL of 26 and HDL 23.

## 2019-03-21 NOTE — Assessment & Plan Note (Signed)
History of essential hypertension blood pressure measured today at 149/78.  Home he says his blood pressure runs in the 135/80 range.  He is on amlodipine, doxazosin, metoprolol and hydralazine.
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
## 2019-03-21 NOTE — Assessment & Plan Note (Signed)
History of obstructive sleep apnea on CPAP. 

## 2019-03-21 NOTE — Patient Instructions (Signed)
Medication Instructions:  Your physician recommends that you continue on your current medications as directed. Please refer to the Current Medication list given to you today.  If you need a refill on your cardiac medications before your next appointment, please call your pharmacy.   Lab work: NONE  Testing/Procedures: NONE  Follow-Up: At [facility], you and your health needs are our priority.  As part of our continuing mission to provide you with exceptional heart care, we have created designated Provider Care Teams.  These Care Teams include your primary Cardiologist (physician) and Advanced Practice Providers (APPs -  Physician Assistants and Nurse Practitioners) who all work together to provide you with the care you need, when you need it. You may see Quay Burow, MD or one of the following Advanced Practice Providers on your designated Care Team:    Kerin Ransom, PA-C  [place], [place]  Coletta Memos, [place]  Your physician wants you to follow-up in: 6 months with a physicians assistant and 1 year with [name]. You will receive a reminder letter in the mail two months in advance. If you don't receive a letter, please call our office to schedule the follow-up appointment.

## 2019-03-27 ENCOUNTER — Encounter: Payer: 59 | Admitting: Internal Medicine

## 2019-03-29 ENCOUNTER — Other Ambulatory Visit: Payer: Self-pay | Admitting: Internal Medicine

## 2019-03-29 NOTE — Telephone Encounter (Signed)
MEDICATION: insulin lispro (HUMALOG KWIKPEN) 100 UNIT/ML KwikPen  PHARMACY:   [id number] - [place], [place] [address] Phone:  [phone]  Fax:  [phone]     IS THIS A 90 DAY SUPPLY : Yes  IS PATIENT OUT OF MEDICATION: Yes  IF NOT; HOW MUCH IS LEFT: 0  LAST APPOINTMENT DATE: @2 /16/2021  NEXT APPOINTMENT DATE:@5 /18/2021  DO WE HAVE YOUR PERMISSION TO LEAVE A DETAILED MESSAGE: Yes  OTHER COMMENTS:    **Let patient know to contact pharmacy at the end of the day to make sure medication is ready. **  ** Please notify patient to allow 48-72 hours to process**  **Encourage patient to contact the pharmacy for refills or they can request refills through [facility]**

## 2019-03-31 ENCOUNTER — Encounter (HOSPITAL_COMMUNITY): Payer: 59

## 2019-03-31 ENCOUNTER — Encounter: Payer: 59 | Admitting: Vascular Surgery

## 2019-03-31 ENCOUNTER — Other Ambulatory Visit (HOSPITAL_COMMUNITY): Payer: 59

## 2019-03-31 LAB — HM DIABETES EYE EXAM

## 2019-04-03 ENCOUNTER — Other Ambulatory Visit: Payer: Self-pay

## 2019-04-03 ENCOUNTER — Other Ambulatory Visit: Payer: Self-pay | Admitting: Internal Medicine

## 2019-04-03 ENCOUNTER — Encounter: Payer: Self-pay | Admitting: Internal Medicine

## 2019-04-03 ENCOUNTER — Ambulatory Visit (INDEPENDENT_AMBULATORY_CARE_PROVIDER_SITE_OTHER): Payer: 59

## 2019-04-03 DIAGNOSIS — Z1159 Encounter for screening for other viral diseases: Secondary | ICD-10-CM

## 2019-04-03 LAB — SARS CORONAVIRUS 2 (TAT 6-24 HRS): SARS Coronavirus 2: NEGATIVE

## 2019-04-05 ENCOUNTER — Telehealth: Payer: Self-pay | Admitting: Internal Medicine

## 2019-04-05 NOTE — Telephone Encounter (Signed)
Pt is scheduled for a colon tomorrow and has questions about prep instructions.

## 2019-04-05 NOTE — Telephone Encounter (Signed)
Pt called with questions regarding his prep. Went over prep instructions with patient, and told him what times he needed to start drinking prep today, and when to start on day of procedure. Patient verbalized understanding.

## 2019-04-06 ENCOUNTER — Other Ambulatory Visit: Payer: Self-pay

## 2019-04-06 ENCOUNTER — Ambulatory Visit (AMBULATORY_SURGERY_CENTER): Payer: 59 | Admitting: Internal Medicine

## 2019-04-06 ENCOUNTER — Encounter: Payer: Self-pay | Admitting: Internal Medicine

## 2019-04-06 VITALS — BP 148/72 | HR 67 | Temp 98.2°F | Resp 21 | Ht 71.0 in | Wt 284.0 lb

## 2019-04-06 DIAGNOSIS — D124 Benign neoplasm of descending colon: Secondary | ICD-10-CM | POA: Diagnosis not present

## 2019-04-06 DIAGNOSIS — D120 Benign neoplasm of cecum: Secondary | ICD-10-CM | POA: Diagnosis not present

## 2019-04-06 DIAGNOSIS — D123 Benign neoplasm of transverse colon: Secondary | ICD-10-CM | POA: Diagnosis not present

## 2019-04-06 DIAGNOSIS — Z86010 Personal history of colonic polyps: Secondary | ICD-10-CM | POA: Diagnosis present

## 2019-04-06 DIAGNOSIS — D122 Benign neoplasm of ascending colon: Secondary | ICD-10-CM | POA: Diagnosis not present

## 2019-04-06 MED ORDER — SODIUM CHLORIDE 0.9 % IV SOLN: 500.0000 mL | Freq: Once | INTRAVENOUS | Status: DC

## 2019-04-06 NOTE — Progress Notes (Signed)
Called to room to assist during endoscopic procedure.  Patient ID and intended procedure confirmed with present staff. Received instructions for my participation in the procedure from the performing physician.  

## 2019-04-06 NOTE — Op Note (Signed)
[name] Patient Name: Samuel Reed Procedure Date: 04/06/2019 8:48 AM MRN: [id number] Endoscopist: Jerene Bears , MD Age: 59 Referring MD:  Date of Birth: 10-22-1960 Gender: Male Account #: [id number] Procedure:                Colonoscopy Indications:              High risk colon cancer surveillance: Personal                            history of multiple (3 or more) adenomas, Last                            colonoscopy: January 2018 Medicines:                Monitored Anesthesia Care Procedure:                Pre-Anesthesia Assessment:                           - Prior to the procedure, a History and Physical                            was performed, and patient medications and                            allergies were reviewed. The patient's tolerance of                            previous anesthesia was also reviewed. The risks                            and benefits of the procedure and the sedation                            options and risks were discussed with the patient.                            All questions were answered, and informed consent                            was obtained. Prior Anticoagulants: The patient has                            taken no previous anticoagulant or antiplatelet                            agents. ASA Grade Assessment: III - A patient with                            severe systemic disease. After reviewing the risks                            and benefits, the patient was deemed in  satisfactory condition to undergo the procedure.                           After obtaining informed consent, the colonoscope                            was passed under direct vision. Throughout the                            procedure, the patient's blood pressure, pulse, and                            oxygen saturations were monitored continuously. The                            Colonoscope was introduced through the  anus and                            advanced to the cecum, identified by appendiceal                            orifice and ileocecal valve. The colonoscopy was                            performed without difficulty. The patient tolerated                            the procedure well. The quality of the bowel                            preparation was good. The ileocecal valve,                            appendiceal orifice, and rectum were photographed. Scope In: 9:07:44 AM Scope Out: 9:35:10 AM Scope Withdrawal Time: 0 hours 19 minutes 48 seconds  Total Procedure Duration: 0 hours 27 minutes 26 seconds  Findings:                 The digital rectal exam was normal.                           A 2 mm polyp was found in the cecum. The polyp was                            sessile. The polyp was removed with a cold snare.                            Resection and retrieval were complete.                           A 5 mm polyp was found in the ascending colon. The                            polyp was sessile.  The polyp was removed with a                            cold snare. Resection and retrieval were complete.                           A 5 mm polyp was found in the hepatic flexure. The                            polyp was sessile. The polyp was removed with a                            cold snare. Resection and retrieval were complete.                           Six sessile polyps were found in the transverse                            colon. The polyps were 3 to 7 mm in size. These                            polyps were removed with a cold snare. Resection                            and retrieval were complete.                           Two sessile polyps were found in the descending                            colon. The polyps were 4 to 5 mm in size. These                            polyps were removed with a cold snare. Resection                            and retrieval were complete.                            Internal hemorrhoids were found during                            retroflexion. The hemorrhoids were small. Complications:            No immediate complications. Estimated Blood Loss:     Estimated blood loss was minimal. Impression:               - One 2 mm polyp in the cecum, removed with a cold                            snare. Resected and retrieved.                           - One  5 mm polyp in the ascending colon, removed                            with a cold snare. Resected and retrieved.                           - One 5 mm polyp at the hepatic flexure, removed                            with a cold snare. Resected and retrieved.                           - Six 3 to 7 mm polyps in the transverse colon,                            removed with a cold snare. Resected and retrieved.                           - Two 4 to 5 mm polyps in the descending colon,                            removed with a cold snare. Resected and retrieved.                           - Small internal hemorrhoids. Recommendation:           - Patient has a contact number available for                            emergencies. The signs and symptoms of potential                            delayed complications were discussed with the                            patient. Return to normal activities tomorrow.                            Written discharge instructions were provided to the                            patient.                           - Resume previous diet.                           - Continue present medications.                           - Await pathology results.                           - Repeat colonoscopy is recommended for  surveillance. The colonoscopy date will be                            determined after pathology results from today's                            exam become available for review. Jerene Bears, MD 04/06/2019 9:39:35 AM This  report has been signed electronically.

## 2019-04-06 NOTE — Progress Notes (Signed)
Temp check by:LC Vital check by:DT  The patient states no changes in medical or surgical history since pre-visit screening on 03/13/19.

## 2019-04-06 NOTE — Patient Instructions (Signed)
Handouts given for polyps and hemorrhoids.  Await pathology results.  YOU HAD AN ENDOSCOPIC PROCEDURE TODAY AT [facility]:   Refer to the procedure report that was given to you for any specific questions about what was found during the examination.  If the procedure report does not answer your questions, please call your gastroenterologist to clarify.  If you requested that your care partner not be given the details of your procedure findings, then the procedure report has been included in a sealed envelope for you to review at your convenience later.  YOU SHOULD EXPECT: Some feelings of bloating in the abdomen. Passage of more gas than usual.  Walking can help get rid of the air that was put into your GI tract during the procedure and reduce the bloating. If you had a lower endoscopy (such as a colonoscopy or flexible sigmoidoscopy) you may notice spotting of blood in your stool or on the toilet paper. If you underwent a bowel prep for your procedure, you may not have a normal bowel movement for a few days.  Please Note:  You might notice some irritation and congestion in your nose or some drainage.  This is from the oxygen used during your procedure.  There is no need for concern and it should clear up in a day or so.  SYMPTOMS TO REPORT IMMEDIATELY:   Following lower endoscopy (colonoscopy or flexible sigmoidoscopy):  Excessive amounts of blood in the stool  Significant tenderness or worsening of abdominal pains  Swelling of the abdomen that is new, acute  Fever of 100F or higher  For urgent or emergent issues, a gastroenterologist can be reached at any hour by calling [phone]. Do not use MyChart messaging for urgent concerns.    DIET:  We do recommend a small meal at first, but then you may proceed to your regular diet.  Drink plenty of fluids but you should avoid alcoholic beverages for 24 hours.  ACTIVITY:  You should plan to take it easy for the rest of today  and you should NOT DRIVE or use heavy machinery until tomorrow (because of the sedation medicines used during the test).    FOLLOW UP: Our staff will call the number listed on your records 48-72 hours following your procedure to check on you and address any questions or concerns that you may have regarding the information given to you following your procedure. If we do not reach you, we will leave a message.  We will attempt to reach you two times.  During this call, we will ask if you have developed any symptoms of COVID 19. If you develop any symptoms (ie: fever, flu-like symptoms, shortness of breath, cough etc.) before then, please call [phone].  If you test positive for Covid 19 in the 2 weeks post procedure, please call and report this information to [place].    If any biopsies were taken you will be contacted by phone or by letter within the next 1-3 weeks.  Please call us at [phone] if you have not heard about the biopsies in 3 weeks.    SIGNATURES/CONFIDENTIALITY: You and/or your care partner have signed paperwork which will be entered into your electronic medical record.  These signatures attest to the fact that that the information above on your After Visit Summary has been reviewed and is understood.  Full responsibility of the confidentiality of this discharge information lies with you and/or your care-partner.

## 2019-04-06 NOTE — Progress Notes (Signed)
To PACU, VSS. Report to [email or web address] 

## 2019-04-07 ENCOUNTER — Ambulatory Visit: Payer: 59 | Attending: Internal Medicine

## 2019-04-07 DIAGNOSIS — Z23 Encounter for immunization: Secondary | ICD-10-CM

## 2019-04-07 NOTE — Progress Notes (Signed)
   Covid-19 Vaccination Clinic  Name:  BREKKEN BEACH    MRN: [id number] DOB: September 14, 1960  04/07/2019  Mr. Neyhart was observed post Covid-19 immunization for 15 minutes without incident. He was provided with Vaccine Information Sheet and instruction to access the V-Safe system.   Mr. Brookens was instructed to call 911 with any severe reactions post vaccine: [name] Difficulty breathing  . Swelling of face and throat  . A fast heartbeat  . A bad rash all over body  . Dizziness and weakness   Immunizations Administered    Name Date Dose VIS Date Route   Moderna COVID-19 Vaccine 04/07/2019  8:47 AM 0.5 mL 12/27/2018 Intramuscular   Manufacturer: Moderna   Lot: [id number]   [place]: [id number]

## 2019-04-10 ENCOUNTER — Telehealth: Payer: Self-pay

## 2019-04-10 NOTE — Telephone Encounter (Signed)
  Follow up Call-  Call back number 04/06/2019  Post procedure Call Back phone  # [phone]  Permission to leave phone message Yes  Some recent data might be hidden     Patient questions:  Do you have a fever, pain , or abdominal swelling? No. Pain Score  0 *  Have you tolerated food without any problems? Yes.    Have you been able to return to your normal activities? Yes.    Do you have any questions about your discharge instructions: Diet   No. Medications  No. Follow up visit  No.  Do you have questions or concerns about your Care? No.  Actions: * If pain score is 4 or above: No action needed, pain <4.  1. Have you developed a fever since your procedure? no  2.   Have you had an respiratory symptoms (SOB or cough) since your procedure? no  3.   Have you tested positive for COVID 19 since your procedure no  4.   Have you had any family members/close contacts diagnosed with the COVID 19 since your procedure?  no   If yes to any of these questions please route to Joylene John, RN and Alphonsa Gin, Therapist, sports.

## 2019-04-14 ENCOUNTER — Encounter: Payer: Self-pay | Admitting: Internal Medicine

## 2019-04-14 ENCOUNTER — Other Ambulatory Visit: Payer: Self-pay | Admitting: Internal Medicine

## 2019-04-27 ENCOUNTER — Encounter: Payer: Self-pay | Admitting: Internal Medicine

## 2019-04-27 NOTE — Progress Notes (Signed)
Received labs from nephrology from 04/19/2019: Glucose 201, BUN/creatinine 45/3.07, GFR 21

## 2019-05-02 ENCOUNTER — Other Ambulatory Visit: Payer: Self-pay

## 2019-05-02 ENCOUNTER — Encounter: Payer: Self-pay | Admitting: Cardiovascular Disease

## 2019-05-02 ENCOUNTER — Ambulatory Visit: Payer: 59 | Admitting: Cardiovascular Disease

## 2019-05-02 DIAGNOSIS — R600 Localized edema: Secondary | ICD-10-CM | POA: Diagnosis not present

## 2019-05-02 DIAGNOSIS — E782 Mixed hyperlipidemia: Secondary | ICD-10-CM

## 2019-05-02 DIAGNOSIS — I10 Essential (primary) hypertension: Secondary | ICD-10-CM | POA: Diagnosis not present

## 2019-05-02 DIAGNOSIS — Z9889 Other specified postprocedural states: Secondary | ICD-10-CM
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
## 2019-05-02 NOTE — Patient Instructions (Signed)
Medication Instructions:   NO CHANGES *If you need a refill on your cardiac medications before your next appointment, please call your pharmacy*   Lab Work: NONE If you have labs (blood work) drawn today and your tests are completely normal, you will receive your results only by: [name] MyChart Message (if you have MyChart) OR . A paper copy in the mail If you have any lab test that is abnormal or we need to change your treatment, we will call you to review the results.   Follow-Up: At [facility], you and your health needs are our priority.  As part of our continuing mission to provide you with exceptional heart care, we have created designated Provider Care Teams.  These Care Teams include your primary Cardiologist (physician) and Advanced Practice Providers (APPs -  Physician Assistants and Nurse Practitioners) who all work together to provide you with the care you need, when you need it.  We recommend signing up for the patient portal called "MyChart".  Sign up information is provided on this After Visit Summary.  MyChart is used to connect with patients for Virtual Visits (Telemedicine).  Patients are able to view lab/test results, encounter notes, upcoming appointments, etc.  Non-urgent messages can be sent to your provider as well.   To learn more about what you can do with MyChart, go to [email or web address].    Your next appointment:    6 MONTHS WITH PA AND YEAR WITH DR [name]

## 2019-05-02 NOTE — Assessment & Plan Note (Signed)
History of pericardial tamponade status post subxiphoid window placed by Dr. Kipp Brood through a small right intercostal incision.  Follow-up echo showed no evidence of pericardial effusion.

## 2019-05-02 NOTE — Assessment & Plan Note (Signed)
1-2+ bilateral lower extremity edema on torsemide and as needed Zaroxolyn

## 2019-05-02 NOTE — Assessment & Plan Note (Signed)
History of essential hypertension blood pressure measured today 148/62.  He is on amlodipine, doxazosin, hydralazine and Lopressor.

## 2019-05-02 NOTE — Assessment & Plan Note (Signed)
History of hyperlipidemia on statin therapy lipid profile performed 01/16/2019 revealing total cholesterol 92, LDL 26 and HDL 23.

## 2019-05-09 ENCOUNTER — Ambulatory Visit: Payer: 59 | Attending: Internal Medicine

## 2019-05-09 DIAGNOSIS — Z23 Encounter for immunization: Secondary | ICD-10-CM

## 2019-05-09 NOTE — Progress Notes (Signed)
   Covid-19 Vaccination Clinic  Name:  Samuel Reed    MRN: [id number] DOB: [birth year]  05/09/2019  [name] was observed post Covid-19 immunization for 15 minutes without incident. He was provided with Vaccine Information Sheet and instruction to access the V-Safe system.   [name] was instructed to call 911 with any severe reactions post vaccine: [name] Difficulty breathing  . Swelling of face and throat  . A fast heartbeat  . A bad rash all over body  . Dizziness and weakness   Immunizations Administered    Name Date Dose VIS Date Route   Moderna COVID-19 Vaccine 05/09/2019 10:13 AM 0.5 mL 12/27/2018 Intramuscular   Manufacturer: Levan Hurst   Lot: [id number]   [place]: 41364-383-77      Covid-19 Vaccination Clinic  Name:  Samuel Reed    MRN: [id number] DOB: 21-Jul-1960  05/09/2019  [name] was observed post Covid-19 immunization for 15 minutes without incident. He was provided with Vaccine Information Sheet and instruction to access the V-Safe system.   [name] was instructed to call 911 with any severe reactions post vaccine: [name] Difficulty breathing  . Swelling of face and throat  . A fast heartbeat  . A bad rash all over body  . Dizziness and weakness   Immunizations Administered    Name Date Dose VIS Date Route   Moderna COVID-19 Vaccine 05/09/2019 10:13 AM 0.5 mL 12/27/2018 Intramuscular   Manufacturer: Moderna   Lot: [id number]   [place]: [id number]

## 2019-06-13 ENCOUNTER — Ambulatory Visit: Payer: 59 | Admitting: Internal Medicine

## 2019-06-14 ENCOUNTER — Other Ambulatory Visit: Payer: Self-pay

## 2019-06-14 ENCOUNTER — Encounter: Payer: Self-pay | Admitting: Podiatry

## 2019-06-14 ENCOUNTER — Other Ambulatory Visit: Payer: 59 | Admitting: Orthotics

## 2019-06-14 ENCOUNTER — Ambulatory Visit: Payer: 59 | Admitting: Podiatry

## 2019-06-14 VITALS — Temp 96.8°F

## 2019-06-14 DIAGNOSIS — M216X2 Other acquired deformities of left foot: Secondary | ICD-10-CM | POA: Diagnosis not present

## 2019-06-14 DIAGNOSIS — E0843 Diabetes mellitus due to underlying condition with diabetic autonomic (poly)neuropathy: Secondary | ICD-10-CM

## 2019-06-14 DIAGNOSIS — B351 Tinea unguium: Secondary | ICD-10-CM

## 2019-06-14 DIAGNOSIS — M79676 Pain in unspecified toe(s): Secondary | ICD-10-CM | POA: Diagnosis not present

## 2019-06-14 NOTE — Progress Notes (Signed)
This patient presents the office for treatment of his long thick painful nails.  These nails are painful walking wearing his shoes.  He also has been a patient of Dr. Amalia Hailey who treated him for an ulcer on the bottom of his left foot.  This ulcer has completely healed.  He says he still has pain through the outside of his left forefoot.  He says when he wears his diabetic shoes he does well.  He says he has pain in the bottom of his left foot wearing his steel toed shoes at work.  This patient has been diagnosed with venous stasis dermatitis chronic renal disease diabetic neuropathy and venous insufficiency.  This patient presents the office for further evaluation and treatment.  General Appearance  Alert, conversant and in no acute stress.  Vascular  Dorsalis pedis and posterior tibial  pulses are palpable  bilaterally.  Capillary return is within normal limits  bilaterally. Temperature is within normal limits  bilaterally.  Neurologic  Senn-Weinstein monofilament wire test diminished   bilaterally. Muscle power within normal limits bilaterally.  Nails Thick disfigured discolored nails with subungual debris  from hallux to fifth toes bilaterally. No evidence of bacterial infection or drainage bilaterally.  Orthopedic  No limitations of motion  feet .  No crepitus or effusions noted.  No bony pathology or digital deformities noted. Prominent fourth metatarsal head left foot.  Skin  normotropic skin with no porokeratosis noted bilaterally.  No signs of infections or ulcers noted.    Onychomycosis  Prominent metatarsal head fourth MPJ left foot  Debride nails with nail nipper and dremel tool.   Added padding to insole of hos steel toed shoes.  Told him to be evaluated by [facility]. for insoles.   RTC 3 months  Gardiner Barefoot DPM

## 2019-06-20 ENCOUNTER — Encounter: Payer: Self-pay | Admitting: Internal Medicine

## 2019-06-20 ENCOUNTER — Ambulatory Visit: Payer: 59 | Admitting: Internal Medicine

## 2019-06-20 ENCOUNTER — Other Ambulatory Visit: Payer: Self-pay

## 2019-06-20 VITALS — BP 140/80 | HR 71 | Ht 72.0 in | Wt 271.4 lb

## 2019-06-20 DIAGNOSIS — E785 Hyperlipidemia, unspecified: Secondary | ICD-10-CM

## 2019-06-20 DIAGNOSIS — E1165 Type 2 diabetes mellitus with hyperglycemia: Secondary | ICD-10-CM | POA: Diagnosis not present

## 2019-06-20 DIAGNOSIS — N183 Chronic kidney disease, stage 3 unspecified: Secondary | ICD-10-CM

## 2019-06-20 DIAGNOSIS — IMO0002 Reserved for concepts with insufficient information to code with codable children: Secondary | ICD-10-CM

## 2019-06-20 DIAGNOSIS — E669 Obesity, unspecified: Secondary | ICD-10-CM

## 2019-06-20 DIAGNOSIS — E1122 Type 2 diabetes mellitus with diabetic chronic kidney disease: Secondary | ICD-10-CM

## 2019-06-20 DIAGNOSIS — Z794 Long term (current) use of insulin: Secondary | ICD-10-CM | POA: Diagnosis not present

## 2019-06-20 LAB — POCT GLYCOSYLATED HEMOGLOBIN (HGB A1C): Hemoglobin A1C: 8.9 % — AB (ref 4.0–5.6)
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
## 2019-06-20 NOTE — Patient Instructions (Addendum)
Please continue: - Basaglar 65 units at bedtime - Humalog 10-20 units before meals. - Ozempic 0.5 mg weekly  Please return in 3 months with your sugar log.

## 2019-07-03 ENCOUNTER — Telehealth: Payer: Self-pay

## 2019-07-03 NOTE — Telephone Encounter (Signed)
Forms for DM shoes filled out, signed by Dr. Cruzita Lederer and faxed to [facility] with confirmation.

## 2019-07-09 ENCOUNTER — Other Ambulatory Visit: Payer: Self-pay | Admitting: Internal Medicine

## 2019-07-16 ENCOUNTER — Other Ambulatory Visit: Payer: Self-pay | Admitting: Internal Medicine

## 2019-07-30 ENCOUNTER — Other Ambulatory Visit: Payer: Self-pay | Admitting: Internal Medicine

## 2019-08-11 ENCOUNTER — Ambulatory Visit: Payer: 59 | Admitting: Orthotics

## 2019-08-11 ENCOUNTER — Other Ambulatory Visit: Payer: Self-pay

## 2019-08-11 DIAGNOSIS — M216X9 Other acquired deformities of unspecified foot: Secondary | ICD-10-CM

## 2019-08-11 DIAGNOSIS — E1140 Type 2 diabetes mellitus with diabetic neuropathy, unspecified: Secondary | ICD-10-CM

## 2019-08-25 ENCOUNTER — Other Ambulatory Visit: Payer: Self-pay

## 2019-08-25 ENCOUNTER — Encounter: Payer: Self-pay | Admitting: Internal Medicine

## 2019-08-25 ENCOUNTER — Ambulatory Visit (INDEPENDENT_AMBULATORY_CARE_PROVIDER_SITE_OTHER): Payer: 59 | Admitting: Internal Medicine

## 2019-08-25 DIAGNOSIS — IMO0002 Reserved for concepts with insufficient information to code with codable children: Secondary | ICD-10-CM

## 2019-08-25 DIAGNOSIS — Z0000 Encounter for general adult medical examination without abnormal findings: Secondary | ICD-10-CM | POA: Diagnosis not present

## 2019-08-25 DIAGNOSIS — E1122 Type 2 diabetes mellitus with diabetic chronic kidney disease: Secondary | ICD-10-CM | POA: Diagnosis not present

## 2019-08-25 DIAGNOSIS — N184 Chronic kidney disease, stage 4 (severe): Secondary | ICD-10-CM

## 2019-08-25 DIAGNOSIS — I10 Essential (primary) hypertension: Secondary | ICD-10-CM | POA: Diagnosis not present

## 2019-08-25 DIAGNOSIS — E1165 Type 2 diabetes mellitus with hyperglycemia: Secondary | ICD-10-CM

## 2019-08-25 DIAGNOSIS — N183 Chronic kidney disease, stage 3 unspecified: Secondary | ICD-10-CM

## 2019-08-25 DIAGNOSIS — Z794 Long term (current) use of insulin: Secondary | ICD-10-CM

## 2019-08-25 NOTE — Assessment & Plan Note (Signed)
Working on transplant program Would try peritoneal dialysis if needed

## 2019-08-25 NOTE — Assessment & Plan Note (Signed)
Lab Results  Component Value Date   HGBA1C 8.9 (A) 06/20/2019   Still suboptimal but improved Working with Dr Cruzita Lederer

## 2019-08-25 NOTE — Assessment & Plan Note (Signed)
Working on lifestyle Recent colon---repeat in 2 years Defer PSA to next year Flu vaccine in the fall

## 2019-08-25 NOTE — Assessment & Plan Note (Signed)
BP Readings from Last 3 Encounters:  08/25/19 (!) 138/76  06/20/19 140/80  05/02/19 (!) 148/62   Good control on meds

## 2019-08-25 NOTE — Assessment & Plan Note (Signed)
BMI 36 with DM, HTN, CKD 3 Is working on lifestyle---goal to lose 10# in the next year
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
## 2019-09-06 ENCOUNTER — Other Ambulatory Visit: Payer: Self-pay | Admitting: Internal Medicine

## 2019-09-10 ENCOUNTER — Other Ambulatory Visit: Payer: Self-pay | Admitting: Internal Medicine

## 2019-09-13 ENCOUNTER — Encounter: Payer: Self-pay | Admitting: Podiatry

## 2019-09-13 ENCOUNTER — Ambulatory Visit (INDEPENDENT_AMBULATORY_CARE_PROVIDER_SITE_OTHER): Payer: 59 | Admitting: Podiatry

## 2019-09-13 ENCOUNTER — Other Ambulatory Visit: Payer: Self-pay

## 2019-09-13 DIAGNOSIS — E0843 Diabetes mellitus due to underlying condition with diabetic autonomic (poly)neuropathy: Secondary | ICD-10-CM

## 2019-09-13 DIAGNOSIS — B351 Tinea unguium: Secondary | ICD-10-CM

## 2019-09-13 DIAGNOSIS — M79676 Pain in unspecified toe(s): Secondary | ICD-10-CM | POA: Diagnosis not present

## 2019-09-13 NOTE — Progress Notes (Signed)
This patient presents the office for treatment of his long thick painful nails.  These nails are painful walking wearing his shoes.    His previous ulcer has healed and he is wearing braces fabricated by Benjie Karvonen.    This patient has been diagnosed with venous stasis dermatitis chronic renal disease diabetic neuropathy and venous insufficiency.  This patient presents the office for further evaluation and treatment.  General Appearance  Alert, conversant and in no acute stress.  Vascular  Dorsalis pedis and posterior tibial  pulses are palpable  bilaterally.  Capillary return is within normal limits  bilaterally. Temperature is within normal limits  bilaterally.  Neurologic  Senn-Weinstein monofilament wire test diminished   bilaterally. Muscle power within normal limits bilaterally.  Nails Thick disfigured discolored nails with subungual debris  from hallux to fifth toes bilaterally. No evidence of bacterial infection or drainage bilaterally. Subungual hematoma sub 5th toenail left foot.  Orthopedic  No limitations of motion  feet .  No crepitus or effusions noted.  No bony pathology or digital deformities noted. Prominent fourth metatarsal head left foot.  Skin  normotropic skin with no porokeratosis noted bilaterally.  No signs of infections or ulcers noted.    Onychomycosis  Prominent metatarsal head fourth MPJ left foot  Debride nails with nail nipper and dremel tool.  .  Continiue to wear insoles in shoes.  RTC 3 months      Gardiner Barefoot DPM

## 2019-09-21 ENCOUNTER — Ambulatory Visit (INDEPENDENT_AMBULATORY_CARE_PROVIDER_SITE_OTHER): Payer: 59 | Admitting: Internal Medicine

## 2019-09-21 ENCOUNTER — Encounter: Payer: Self-pay | Admitting: Internal Medicine

## 2019-09-21 ENCOUNTER — Other Ambulatory Visit: Payer: Self-pay

## 2019-09-21 VITALS — BP 160/82 | HR 71 | Ht 71.5 in | Wt 266.0 lb

## 2019-09-21 DIAGNOSIS — E669 Obesity, unspecified: Secondary | ICD-10-CM | POA: Diagnosis not present

## 2019-09-21 DIAGNOSIS — IMO0002 Reserved for concepts with insufficient information to code with codable children: Secondary | ICD-10-CM

## 2019-09-21 DIAGNOSIS — E785 Hyperlipidemia, unspecified: Secondary | ICD-10-CM

## 2019-09-21 DIAGNOSIS — E1165 Type 2 diabetes mellitus with hyperglycemia: Secondary | ICD-10-CM

## 2019-09-21 DIAGNOSIS — E1122 Type 2 diabetes mellitus with diabetic chronic kidney disease: Secondary | ICD-10-CM | POA: Diagnosis not present

## 2019-09-21 DIAGNOSIS — Z794 Long term (current) use of insulin: Secondary | ICD-10-CM | POA: Diagnosis not present

## 2019-09-21 DIAGNOSIS — N183 Chronic kidney disease, stage 3 unspecified: Secondary | ICD-10-CM | POA: Diagnosis not present

## 2019-09-21 LAB — POCT GLYCOSYLATED HEMOGLOBIN (HGB A1C): Hemoglobin A1C: 8.2 % — AB (ref 4.0–5.6)

## 2019-09-21 MED ORDER — OZEMPIC (1 MG/DOSE) 4 MG/3ML ~~LOC~~ SOPN
1.0000 mg | PEN_INJECTOR | SUBCUTANEOUS | 3 refills | Status: DC
Start: 2019-09-21 — End: 2020-03-11

## 2019-09-21 NOTE — Patient Instructions (Addendum)
Please continue: - Basaglar 65 units at bedtime - Humalog 10-20 units before meals.  Please increase: - Ozempic 1 mg weekly  Please return in 3 months with with the CGM receiver.
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
## 2019-09-25 ENCOUNTER — Encounter (HOSPITAL_COMMUNITY): Payer: Self-pay

## 2019-09-25 ENCOUNTER — Telehealth: Payer: Self-pay

## 2019-09-25 ENCOUNTER — Other Ambulatory Visit: Payer: Self-pay

## 2019-09-25 ENCOUNTER — Ambulatory Visit (HOSPITAL_COMMUNITY)
Admission: EM | Admit: 2019-09-25 | Discharge: 2019-09-25 | Disposition: A | Discharge status: Home / Self Care | Payer: 59 | Attending: Family Medicine | Admitting: Family Medicine

## 2019-09-25 DIAGNOSIS — R109 Unspecified abdominal pain: Secondary | ICD-10-CM | POA: Diagnosis not present

## 2019-09-25 LAB — CBC WITH DIFFERENTIAL/PLATELET
Abs Immature Granulocytes: 0.03 10*3/uL (ref 0.00–0.07)
Basophils Absolute: 0.1 10*3/uL (ref 0.0–0.1)
Basophils Relative: 1 %
Eosinophils Absolute: 0.3 10*3/uL (ref 0.0–0.5)
Eosinophils Relative: 3 %
HCT: 41.1 % (ref 39.0–52.0)
Hemoglobin: 13.6 g/dL (ref 13.0–17.0)
Immature Granulocytes: 0 %
Lymphocytes Relative: 17 %
Lymphs Abs: 1.7 10*3/uL (ref 0.7–4.0)
MCH: 27.8 pg (ref 26.0–34.0)
MCHC: 33.1 g/dL (ref 30.0–36.0)
MCV: 83.9 fL (ref 80.0–100.0)
Monocytes Absolute: 0.7 10*3/uL (ref 0.1–1.0)
Monocytes Relative: 7 %
Neutro Abs: 6.9 10*3/uL (ref 1.7–7.7)
Neutrophils Relative %: 72 %
Platelets: 198 10*3/uL (ref 150–400)
RBC: 4.9 MIL/uL (ref 4.22–5.81)
RDW: 12.8 % (ref 11.5–15.5)
WBC: 9.7 10*3/uL (ref 4.0–10.5)
nRBC: 0 % (ref 0.0–0.2)

## 2019-09-25 LAB — COMPREHENSIVE METABOLIC PANEL
ALT: 24 U/L (ref 0–44)
AST: 21 U/L (ref 15–41)
Albumin: 4.2 g/dL (ref 3.5–5.0)
Alkaline Phosphatase: 78 U/L (ref 38–126)
Anion gap: 15 (ref 5–15)
BUN: 64 mg/dL — ABNORMAL HIGH (ref 6–20)
CO2: 27 mmol/L (ref 22–32)
Calcium: 9.4 mg/dL (ref 8.9–10.3)
Chloride: 100 mmol/L (ref 98–111)
Creatinine, Ser: 4.02 mg/dL — ABNORMAL HIGH (ref 0.61–1.24)
GFR calc Af Amer: 18 mL/min — ABNORMAL LOW (ref >60)
GFR calc non Af Amer: 15 mL/min — ABNORMAL LOW (ref >60)
Glucose, Bld: 195 mg/dL — ABNORMAL HIGH (ref 70–99)
Potassium: 3.2 mmol/L — ABNORMAL LOW (ref 3.5–5.1)
Sodium: 142 mmol/L (ref 135–145)
Total Bilirubin: 1.2 mg/dL (ref 0.3–1.2)
Total Protein: 7.7 g/dL (ref 6.5–8.1)

## 2019-09-25 LAB — LIPASE, BLOOD: Lipase: 40 U/L (ref 11–51)

## 2019-09-25 NOTE — ED Triage Notes (Signed)
Pt is here with right flank pain that radiates into his abdomen that started a week ago. Pt has taken Pepto Bismol to relieve discomfort.

## 2019-09-25 NOTE — Telephone Encounter (Signed)
Will review ED note 

## 2019-09-25 NOTE — Telephone Encounter (Signed)
[facility] - Client TELEPHONE ADVICE RECORD [facility] Patient Name: Samuel Reed Gender: Male DOB: 1960/09/21 Age: 59 Y 2 M 13 D Return Phone Number: [phone] (Primary), [phone] (Secondary) Address: [address] City/State/Zip: [place] [address] Client [facility] - Client Client Site [facility] - Day Physician Viviana Simpler- MD Contact Type Call Who Is Calling Patient / Member / Family / Caregiver Call Type Triage / Clinical Relationship To Patient Self Return Phone Number [phone] (Primary) Chief Complaint Abdominal Pain Reason for Call Symptomatic / Request for [place] states having moderate RT side abd pain; worse at night; Concerned could be appendix; Translation No Nurse Assessment Nurse: Claiborne Billings, RN, Kim Date/Time (Eastern Time): 09/25/2019 9:17:46 AM Confirm and document reason for call. If symptomatic, describe symptoms. ---Caller states he has pain in the rt side of his abdomen, started 2 or 3 days ago. States no fever "but I don't feel as good as I normally do". States current pain level is 4/10 but pain is worse when he lays down at night. Has the patient had close contact with a person known or suspected to have the novel coronavirus illness OR traveled / lives in area with major community spread (including international travel) in the last 14 days from the onset of symptoms? * If Asymptomatic, screen for exposure and travel within the last 14 days. ---No Does the patient have any new or worsening symptoms? ---Yes Will a triage be completed? ---Yes Related visit to physician within the last 2 weeks? ---Yes Does the PT have any chronic conditions? (i.e. diabetes, asthma, this includes High risk factors for pregnancy, etc.) ---Yes List chronic conditions. ---Diabetes, Stage 4 CKD Is this a behavioral health or substance abuse call?  ---No Guidelines Guideline Title Affirmed Question Affirmed Notes Nurse Date/Time (Eastern Time) Abdominal Pain - Male [1] MILD-MODERATE pain AND [2] constant AND [3] present > 2 hours Claiborne Billings, RN, Kim 09/25/2019 9:19:52 AM Disp. Time Eilene Ghazi Time) Disposition Final User PLEASE NOTE: All timestamps contained within this report are represented as [place] Standard Time. CONFIDENTIALTY NOTICE: This fax transmission is intended only for the addressee. It contains information that is legally privileged, confidential or otherwise protected from use or disclosure. If you are not the intended recipient, you are strictly prohibited from reviewing, disclosing, copying using or disseminating any of this information or taking any action in reliance on or regarding this information. If you have received this fax in error, please notify us immediately by telephone so that we can arrange for its return to [place]. Phone: [phone], Toll-Free: [phone], Fax: [phone] Page: 2 of 2 Call Id: [id number] 09/25/2019 9:21:17 AM See HCP within 4 Hours (or PCP triage) Yes Claiborne Billings, RN, Max Sane Disagree/Comply Comply Caller Understands Yes PreDisposition Did not know what to do Care Advice Given Per Guideline SEE HCP WITHIN 4 HOURS (OR PCP TRIAGE): * IF OFFICE WILL BE OPEN: You need to be seen within the next 3 or 4 hours. Call your doctor (or NP/PA) now or as soon as the office opens. NOTHING BY MOUTH: * Do not eat or drink anything for now. REST: * Lie down. CALL BACK IF: * You become worse. CARE ADVICE given per Abdominal Pain, Male (Adult) guideline. Comments User: Suezanne Jacquet, RN Date/Time Eilene Ghazi Time): 09/25/2019 9:29:39 AM Nurse attempted to warm transfer caller to backline number; received fax tone at backline number. Attempted to warm transfer caller to office number  for appt within 4 hrs but no answer after extended hold time. Caller is informed that recommendation is to be seen within next 4  hours, he may call office number to see if he can schedule appt but if appt is not available in appropriate timeframe he needs to be seen in ER or UC and he verbalizes understanding. Referrals Warm transfer to backline GO TO FACILITY UNDECIDED

## 2019-09-25 NOTE — Telephone Encounter (Signed)
He has only been triaged. Will await full evaluation

## 2019-09-25 NOTE — Telephone Encounter (Signed)
Per chart review tab pt is presently at [facility] in [place]. Will send note to DR Silvio Pate as PCP but is out of office and will send note to Avie Echevaria NP who is in office.

## 2019-09-25 NOTE — Discharge Instructions (Signed)
You have been seen today for abdominal/side pain. Your evaluation was not suggestive of any emergent condition requiring medical intervention at this time. However, some abdominal problems make take more time to appear. Therefore, it is very important for you to pay attention to any new symptoms or worsening of your current condition.  Please return here or to the Emergency Department immediately should you begin to feel worse in any way or have any of the following symptoms: increasing and different abdominal pain, persistent vomiting, inability to drink fluids, fevers, or shaking chills.
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
## 2019-10-31 ENCOUNTER — Other Ambulatory Visit: Payer: Self-pay | Admitting: Internal Medicine

## 2019-11-03 ENCOUNTER — Encounter: Payer: 59 | Attending: Internal Medicine | Admitting: Dietician

## 2019-11-03 ENCOUNTER — Other Ambulatory Visit: Payer: Self-pay

## 2019-11-03 DIAGNOSIS — E1122 Type 2 diabetes mellitus with diabetic chronic kidney disease: Secondary | ICD-10-CM | POA: Diagnosis not present

## 2019-11-03 DIAGNOSIS — E1165 Type 2 diabetes mellitus with hyperglycemia: Secondary | ICD-10-CM | POA: Diagnosis present

## 2019-11-03 DIAGNOSIS — Z794 Long term (current) use of insulin: Secondary | ICD-10-CM | POA: Diagnosis present

## 2019-11-03 DIAGNOSIS — N184 Chronic kidney disease, stage 4 (severe): Secondary | ICD-10-CM | POA: Diagnosis present

## 2019-11-03 DIAGNOSIS — E118 Type 2 diabetes mellitus with unspecified complications: Secondary | ICD-10-CM | POA: Insufficient documentation

## 2019-11-03 DIAGNOSIS — N183 Chronic kidney disease, stage 3 unspecified: Secondary | ICD-10-CM | POA: Diagnosis present

## 2019-11-03 DIAGNOSIS — IMO0002 Reserved for concepts with insufficient information to code with codable children: Secondary | ICD-10-CM

## 2019-11-03 NOTE — Patient Instructions (Addendum)
Good job with lowering your sodium intake.  Choose fresh, frozen, or canned without salt  Fresh meat Limit portion size of meat to 2-3 ounces Increase your vegetable intake as long as your potassium is normal. Limit milk or other dairy to 1/2 or 1 serving daily. Add breakfast Be sure to drink enough water. Ask for your foods to be prepared without salt when possible when eating out.

## 2019-11-03 NOTE — Progress Notes (Signed)
  Medical Nutrition Therapy:  Appt start time: 0940 end time:  1045.   Assessment:  Primary concerns today:   Patient is here today alone. Patient states that he is to see his nephrologist soon and verbalizes that dialysis is in the future.  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  She does the cooking and shopping.   He works for the [employer] and used to be a Pharmacist, hospital at [employer]  He states that he is going to retire soon. Has not checked his blood sugar much lately.  If he ate out his blood sugar is over 200 frequently but 89 the am after a home cooked meal.  Preferred Learning Style:   No preference indicated   Learning Readiness:   Ready  Change in progress   DIETARY INTAKE:  Usual eating pattern includes 2 meals and 1 snacks per day.  Everyday foods include rinsed canned vegetables, vegetable oil or ollive oil.  Avoided foods include dark soda, regular soda, pasta, seldom rice, potatoes, added salt (wife does not add salt in cooking)   24-hr recall:  B ( AM): skips  Snk ( AM):   L ( PM): LS [place] or LS ham sandwich on [employer], apple Snk ( PM):  D ( PM): chicken or beef, beans or green beans, occasional roll OR vegetable plate and occasional dessert when he eats out Snk ( PM): apple or NABS Beverages: water, diet sprite, diet gingerale, 1% milk, occasional coffee, cream and splenda  Usual physical activity: increased walking, pulling, and lifting at work.  Does his own yard work.  Has a membership at the gym and lost a lot of weight but stopped going due to the pandemic.  Estimated energy  needs: 1800-2000 calories 70 g protein  Progress Towards Goal(s):  In progress.   Nutritional Diagnosis:  NB-1.1 Food and nutrition-related knowledge deficit As related to balance of carbohydrate, protein, fat, renal guidelens.  As evidenced by diet hx and patient report.    Intervention:  Nutrition education regarding a renal diabetic diet.  Discussed protein recommendations predialysis which are different than post dialysis.  Discussed importance of balance of meals, timing and consistency.  Discussed no need for a potassium restriction unless potassium is elevated, continue low sodium diet and recommendations provided related to phosphorous.  Plan: Good job with lowering your sodium intake.  Choose fresh, frozen, or canned without salt  Fresh meat Limit portion size of meat to 2-3 ounces Increase your vegetable intake as long as your potassium is normal. Limit milk or other dairy to 1/2 or 1 serving daily. Add breakfast Be sure to drink enough water. Ask for your foods to be prepared without salt when possible when eating out.  Teaching Method Utilized:  Visual Auditory  Handouts given during visit include:  NKD National Kidney diet for Predialysis Placemat  Meal plan card  Barriers to learning/adherence to lifestyle change: none  Demonstrated degree of understanding via:  Teach Back   Monitoring/Evaluation:  Dietary intake, exercise, and body weight prn.

## 2019-11-08 ENCOUNTER — Other Ambulatory Visit: Payer: Self-pay | Admitting: Internal Medicine

## 2019-11-17 ENCOUNTER — Ambulatory Visit: Payer: 59 | Admitting: Cardiology

## 2019-11-25 ENCOUNTER — Other Ambulatory Visit: Payer: Self-pay | Admitting: Internal Medicine

## 2019-11-27 ENCOUNTER — Other Ambulatory Visit: Payer: Self-pay

## 2019-11-27 MED ORDER — TOUJEO SOLOSTAR 300 UNIT/ML ~~LOC~~ SOPN: PEN_INJECTOR | SUBCUTANEOUS | 3 refills | Status: DC

## 2019-11-27 NOTE — Telephone Encounter (Signed)
Okay to change?  Thanks!   

## 2019-11-27 NOTE — Telephone Encounter (Signed)
OK to change to [employer]

## 2019-11-27 NOTE — Telephone Encounter (Signed)
RX sent to pharmacy  

## 2019-12-15 ENCOUNTER — Ambulatory Visit: Payer: 59 | Admitting: Podiatry

## 2019-12-19 ENCOUNTER — Other Ambulatory Visit: Payer: Self-pay | Admitting: Internal Medicine

## 2020-01-03 ENCOUNTER — Other Ambulatory Visit: Payer: Self-pay | Admitting: Internal Medicine

## 2020-01-03 ENCOUNTER — Ambulatory Visit: Payer: 59 | Admitting: Internal Medicine
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
## 2020-01-05 ENCOUNTER — Other Ambulatory Visit: Payer: Self-pay | Admitting: Internal Medicine

## 2020-01-05 ENCOUNTER — Other Ambulatory Visit: Payer: Self-pay

## 2020-01-05 ENCOUNTER — Telehealth: Payer: Self-pay | Admitting: Internal Medicine

## 2020-01-05 ENCOUNTER — Ambulatory Visit (INDEPENDENT_AMBULATORY_CARE_PROVIDER_SITE_OTHER): Payer: 59 | Admitting: Cardiology

## 2020-01-05 ENCOUNTER — Encounter: Payer: Self-pay | Admitting: Cardiology

## 2020-01-05 VITALS — BP 138/62 | HR 68 | Ht 72.0 in | Wt 264.6 lb

## 2020-01-05 DIAGNOSIS — I313 Pericardial effusion (noninflammatory): Secondary | ICD-10-CM

## 2020-01-05 DIAGNOSIS — E1142 Type 2 diabetes mellitus with diabetic polyneuropathy: Secondary | ICD-10-CM

## 2020-01-05 DIAGNOSIS — Z87898 Personal history of other specified conditions: Secondary | ICD-10-CM | POA: Insufficient documentation

## 2020-01-05 DIAGNOSIS — G4733 Obstructive sleep apnea (adult) (pediatric): Secondary | ICD-10-CM | POA: Diagnosis not present

## 2020-01-05 DIAGNOSIS — E119 Type 2 diabetes mellitus without complications: Secondary | ICD-10-CM

## 2020-01-05 DIAGNOSIS — N184 Chronic kidney disease, stage 4 (severe): Secondary | ICD-10-CM

## 2020-01-05 DIAGNOSIS — I10 Essential (primary) hypertension: Secondary | ICD-10-CM | POA: Diagnosis not present

## 2020-01-05 DIAGNOSIS — I3139 Other pericardial effusion (noninflammatory): Secondary | ICD-10-CM

## 2020-01-05 DIAGNOSIS — Z794 Long term (current) use of insulin: Secondary | ICD-10-CM

## 2020-01-05 MED ORDER — BASAGLAR KWIKPEN 100 UNIT/ML ~~LOC~~ SOPN: 65.0000 [IU] | PEN_INJECTOR | Freq: Every day | SUBCUTANEOUS | 3 refills | Status: DC
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
## 2020-01-05 NOTE — Assessment & Plan Note (Signed)
Pericardial effusion with tamponade Dec 2020-s/p pericardial window 01/16/2019 (negative path) F/U echo Jan 2021- EF 55-60%, grade 3 DD, mild LAE

## 2020-01-05 NOTE — Assessment & Plan Note (Signed)
On C-pap 

## 2020-01-05 NOTE — Assessment & Plan Note (Signed)
Controlled- normal LVF, grade 3 DD on echo

## 2020-01-05 NOTE — Telephone Encounter (Signed)
OK, I sent a prescription for Basaglar and took Toujeo off his list.

## 2020-01-05 NOTE — Assessment & Plan Note (Signed)
BMI down to 35- he has lost 20lbs since March 2021

## 2020-01-05 NOTE — Telephone Encounter (Signed)
Patient is requesting the Basaglar 65 unit but it's not on current medication list. Please advise

## 2020-01-05 NOTE — Assessment & Plan Note (Signed)
Pt had chest pain 10 years ago- cath reportedly normal- symptoms relieved when he started C-pap

## 2020-01-05 NOTE — Telephone Encounter (Signed)
Patient called stating his pharmacy has sent over a refill request on "bayzar" 65 units. I do not see this in the medication list. Patient stated it is Bayzar 65 units and he is out.  Please advise [phone]

## 2020-01-05 NOTE — Telephone Encounter (Signed)
Patient notified that script have been sent

## 2020-01-05 NOTE — Assessment & Plan Note (Signed)
Pt has type 2 IDDM with neuropathy and nephropathy Hgb A1c down to 8.2.  Followed by Dr Cruzita Lederer

## 2020-01-05 NOTE — Assessment & Plan Note (Signed)
Followed by Dr Sanford-currently being evaluated at [facility] for possible transplant

## 2020-01-05 NOTE — Patient Instructions (Signed)

## 2020-01-24 ENCOUNTER — Encounter: Payer: Self-pay | Admitting: Internal Medicine

## 2020-01-24 ENCOUNTER — Other Ambulatory Visit: Payer: Self-pay | Admitting: Internal Medicine

## 2020-01-24 ENCOUNTER — Other Ambulatory Visit: Payer: Self-pay

## 2020-01-24 ENCOUNTER — Ambulatory Visit (INDEPENDENT_AMBULATORY_CARE_PROVIDER_SITE_OTHER): Payer: 59 | Admitting: Internal Medicine

## 2020-01-24 DIAGNOSIS — M25562 Pain in left knee: Secondary | ICD-10-CM | POA: Diagnosis not present

## 2020-01-24 MED ORDER — TRAMADOL HCL 50 MG PO TABS
50.0000 mg | ORAL_TABLET | Freq: Three times a day (TID) | ORAL | 0 refills | Status: DC | PRN
Start: 2020-01-24 — End: 2020-02-09

## 2020-01-24 MED ORDER — TRAMADOL HCL 50 MG PO TABS: 50.0000 mg | ORAL_TABLET | Freq: Three times a day (TID) | ORAL | 0 refills | Status: DC | PRN
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
## 2020-01-24 NOTE — Assessment & Plan Note (Signed)
Findings suggest meniscus tear Has effusion so may benefit from aspiration and cortisone injection Will try to get in urgently with ortho

## 2020-01-25 ENCOUNTER — Ambulatory Visit: Payer: 59 | Admitting: Surgical

## 2020-01-25 ENCOUNTER — Ambulatory Visit: Payer: Self-pay

## 2020-01-25 DIAGNOSIS — M25462 Effusion, left knee: Secondary | ICD-10-CM | POA: Diagnosis not present

## 2020-01-25 DIAGNOSIS — M25562 Pain in left knee: Secondary | ICD-10-CM

## 2020-01-26 LAB — SYNOVIAL FLUID ANALYSIS, COMPLETE
Basophils, %: 0 %
Eosinophils-Synovial: 0 % (ref 0–2)
Lymphocytes-Synovial Fld: 70 % (ref 0–74)
Monocyte/Macrophage: 10 % (ref 0–69)
Neutrophil, Synovial: 20 % (ref 0–24)
Synoviocytes, %: 0 % (ref 0–15)
WBC, Synovial: 107 cells/uL (ref <150)

## 2020-01-26 LAB — TIQ-NTM

## 2020-01-28 ENCOUNTER — Encounter: Payer: Self-pay | Admitting: Surgical

## 2020-01-28 DIAGNOSIS — M25462 Effusion, left knee: Secondary | ICD-10-CM | POA: Diagnosis not present

## 2020-01-28 MED ORDER — METHYLPREDNISOLONE ACETATE 40 MG/ML IJ SUSP
40.0000 mg | INTRAMUSCULAR | Status: AC | PRN
  Administered 2020-01-28: 40 mg via INTRA_ARTICULAR

## 2020-01-28 MED ORDER — BUPIVACAINE HCL 0.25 % IJ SOLN
4.0000 mL | INTRAMUSCULAR | Status: AC | PRN
  Administered 2020-01-28: 4 mL via INTRA_ARTICULAR

## 2020-01-28 MED ORDER — LIDOCAINE HCL 1 % IJ SOLN
5.0000 mL | INTRAMUSCULAR | Status: AC | PRN
  Administered 2020-01-28: 5 mL
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
## 2020-02-09 ENCOUNTER — Other Ambulatory Visit: Payer: Self-pay

## 2020-02-09 ENCOUNTER — Telehealth: Payer: Self-pay

## 2020-02-09 MED ORDER — TRAMADOL HCL 50 MG PO TABS
50.0000 mg | ORAL_TABLET | Freq: Three times a day (TID) | ORAL | 0 refills | Status: DC | PRN
Start: 2020-02-09 — End: 2020-08-02

## 2020-02-09 NOTE — Telephone Encounter (Signed)
He said thank you so much!

## 2020-02-09 NOTE — Telephone Encounter (Signed)
Please advise 

## 2020-02-09 NOTE — Telephone Encounter (Signed)
Will refill Tramadol. Not comfortable giving him something stronger without assessing him

## 2020-02-09 NOTE — Telephone Encounter (Signed)
Tried calling patient. No answer. LMVM for patient advising per below.

## 2020-02-09 NOTE — Telephone Encounter (Signed)
Pt called to say he was in a lot of pain with his knee. He is going to ortho next week. Was asking for a refill of tramadol or something stronger than tramadol if possible.   I called pt back to let him know Dr Silvio Pate was out of the office already for the weekend. I was not sure if he will see the message a refill or not.   I will see if [facility] would be willing to fill the tramadol.

## 2020-02-09 NOTE — Telephone Encounter (Signed)
Looks like he just had tramadol sent in by another provider

## 2020-02-09 NOTE — Telephone Encounter (Signed)
patient called he is requesting something for pain he state he is currently taking tylenol and it isn't working he also stated he has  chronic kidney failure. CB:[phone]
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
## 2020-02-14 ENCOUNTER — Ambulatory Visit: Payer: 59 | Admitting: Internal Medicine

## 2020-02-14 ENCOUNTER — Ambulatory Visit: Payer: 59 | Admitting: Orthopedic Surgery

## 2020-02-14 DIAGNOSIS — M25562 Pain in left knee: Secondary | ICD-10-CM | POA: Diagnosis not present

## 2020-02-18 ENCOUNTER — Encounter: Payer: Self-pay | Admitting: Orthopedic Surgery
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
## 2020-02-21 ENCOUNTER — Ambulatory Visit: Payer: 59

## 2020-02-22 ENCOUNTER — Ambulatory Visit: Payer: 59 | Admitting: Orthopedic Surgery

## 2020-03-11 ENCOUNTER — Ambulatory Visit: Payer: 59 | Admitting: Internal Medicine

## 2020-03-11 ENCOUNTER — Encounter: Payer: Self-pay | Admitting: Internal Medicine

## 2020-03-11 ENCOUNTER — Other Ambulatory Visit: Payer: Self-pay

## 2020-03-11 VITALS — BP 170/80 | HR 74 | Ht 72.0 in | Wt 267.8 lb

## 2020-03-11 DIAGNOSIS — N183 Chronic kidney disease, stage 3 unspecified: Secondary | ICD-10-CM

## 2020-03-11 DIAGNOSIS — Z794 Long term (current) use of insulin: Secondary | ICD-10-CM | POA: Diagnosis not present

## 2020-03-11 DIAGNOSIS — E1122 Type 2 diabetes mellitus with diabetic chronic kidney disease: Secondary | ICD-10-CM

## 2020-03-11 DIAGNOSIS — IMO0002 Reserved for concepts with insufficient information to code with codable children: Secondary | ICD-10-CM

## 2020-03-11 DIAGNOSIS — E669 Obesity, unspecified: Secondary | ICD-10-CM | POA: Diagnosis not present

## 2020-03-11 DIAGNOSIS — E1165 Type 2 diabetes mellitus with hyperglycemia: Secondary | ICD-10-CM

## 2020-03-11 DIAGNOSIS — E785 Hyperlipidemia, unspecified: Secondary | ICD-10-CM | POA: Diagnosis not present

## 2020-03-11 LAB — POCT GLYCOSYLATED HEMOGLOBIN (HGB A1C): Hemoglobin A1C: 7.5 % — AB (ref 4.0–5.6)

## 2020-03-11 MED ORDER — INSULIN LISPRO (1 UNIT DIAL) 100 UNIT/ML (KWIKPEN)
PEN_INJECTOR | SUBCUTANEOUS | 3 refills | Status: DC
Start: 2020-03-11 — End: 2021-01-07

## 2020-03-11 MED ORDER — BASAGLAR KWIKPEN 100 UNIT/ML ~~LOC~~ SOPN
55.0000 [IU] | PEN_INJECTOR | Freq: Every day | SUBCUTANEOUS | 3 refills | Status: DC
Start: 2020-03-11 — End: 2020-08-30

## 2020-03-11 MED ORDER — OZEMPIC (1 MG/DOSE) 4 MG/3ML ~~LOC~~ SOPN
1.0000 mg | PEN_INJECTOR | SUBCUTANEOUS | 3 refills | Status: DC
Start: 2020-03-11 — End: 2020-08-02

## 2020-03-11 NOTE — Patient Instructions (Addendum)
Please decrease: - Basaglar 55 units at bedtime  Continue: - Humalog 10-20 units before meals. - Ozempic 1 mg weekly  Please return in 3 months with with the CGM receiver.
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
## 2020-03-22 ENCOUNTER — Ambulatory Visit: Payer: 59 | Admitting: Podiatry

## 2020-04-26 ENCOUNTER — Encounter: Payer: Self-pay | Admitting: Cardiovascular Disease

## 2020-04-26 ENCOUNTER — Other Ambulatory Visit: Payer: Self-pay

## 2020-04-26 ENCOUNTER — Ambulatory Visit (INDEPENDENT_AMBULATORY_CARE_PROVIDER_SITE_OTHER): Payer: 59 | Admitting: Cardiovascular Disease

## 2020-04-26 VITALS — BP 140/76 | HR 64 | Ht 72.0 in | Wt 267.2 lb

## 2020-04-26 DIAGNOSIS — E782 Mixed hyperlipidemia: Secondary | ICD-10-CM

## 2020-04-26 DIAGNOSIS — I3139 Other pericardial effusion (noninflammatory): Secondary | ICD-10-CM

## 2020-04-26 DIAGNOSIS — I10 Essential (primary) hypertension: Secondary | ICD-10-CM | POA: Diagnosis not present

## 2020-04-26 DIAGNOSIS — G4733 Obstructive sleep apnea (adult) (pediatric): Secondary | ICD-10-CM | POA: Diagnosis not present

## 2020-04-26 DIAGNOSIS — I313 Pericardial effusion (noninflammatory): Secondary | ICD-10-CM | POA: Diagnosis not present
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
## 2020-04-26 NOTE — Assessment & Plan Note (Signed)
History of essential hypertension with blood pressure measured today 140/76.  He does check his blood pressure at home on a weekly basis and his readings are similar to this.  He is on amlodipine, hydralazine and metoprolol as well as Cardura and Zaroxolyn and torsemide.

## 2020-04-26 NOTE — Patient Instructions (Addendum)
Medication Instructions:  Your physician recommends that you continue on your current medications as directed. Please refer to the Current Medication list given to you today.  *If you need a refill on your cardiac medications before your next appointment, please call your pharmacy*   Follow-Up: At [facility], you and your health needs are our priority.  As part of our continuing mission to provide you with exceptional heart care, we have created designated Provider Care Teams.  These Care Teams include your primary Cardiologist (physician) and Advanced Practice Providers (APPs -  Physician Assistants and Nurse Practitioners) who all work together to provide you with the care you need, when you need it.  We recommend signing up for the patient portal called "MyChart".  Sign up information is provided on this After Visit Summary.  MyChart is used to connect with patients for Virtual Visits (Telemedicine).  Patients are able to view lab/test results, encounter notes, upcoming appointments, etc.  Non-urgent messages can be sent to your provider as well.   To learn more about what you can do with MyChart, go to [email or web address].    Your next appointment:   12 month(s)  The format for your next appointment:   In Person  Provider:   Quay Burow, MD and again in the morning to [facility] from this morning she gets she always gets up at [phone] yearly for school April

## 2020-04-26 NOTE — Assessment & Plan Note (Signed)
History of hyperlipidemia on Crestor with lipid profile performed 01/16/2019 revealing total cholesterol of 92, LDL of 26 and HDL 23.

## 2020-04-26 NOTE — Assessment & Plan Note (Signed)
History of pericardial effusion status post subxiphoid window performed by Dr. Kipp Brood.  Pathology was negative with out malignant breath.  2D echo performed 02/01/2019 revealed normal LV systolic function with no evidence of a pericardial effusion and normal valvular function.  He feels clinically improved since his window.

## 2020-04-26 NOTE — Assessment & Plan Note (Signed)
History of obstructive sleep apnea on CPAP. 

## 2020-05-01 ENCOUNTER — Telehealth: Payer: Self-pay | Admitting: Internal Medicine

## 2020-05-01 NOTE — Telephone Encounter (Signed)
Patient called to check status of PA for Ozempic has been out of medication for 2 weeks. Cal back number [phone] - ok to leave message

## 2020-05-08 NOTE — Telephone Encounter (Signed)
Called [facility] and per Rey L PA for Ozempic was approved. Reference number: [id number] Covered dates 05/01/2020 to 05/01/2021. Pt notified.

## 2020-05-08 NOTE — Telephone Encounter (Signed)
PA initiated for Ozempic. Awaiting determination.

## 2020-06-11 ENCOUNTER — Ambulatory Visit: Payer: 59 | Admitting: Internal Medicine

## 2020-07-05 ENCOUNTER — Other Ambulatory Visit: Payer: Self-pay | Admitting: Internal Medicine

## 2020-07-13 ENCOUNTER — Other Ambulatory Visit: Payer: Self-pay | Admitting: Internal Medicine

## 2020-08-01 ENCOUNTER — Telehealth: Payer: Self-pay | Admitting: Pharmacy Technician

## 2020-08-01 ENCOUNTER — Other Ambulatory Visit: Payer: Self-pay | Admitting: Internal Medicine

## 2020-08-01 NOTE — Telephone Encounter (Signed)
Prior authorization response is "This medication or product was previously approved on [id number] from 05/01/2020 to 05/01/2021 You will be able to fill a prescription for this medication at your pharmacy. If your pharmacy has questions regarding the processing of your prescription, please have them call the [facility] pharmacy help desk at [phone]."  [name], CPhT Patient Advocate [facility] Phone: [phone] Fax:  [phone]

## 2020-08-01 NOTE — Telephone Encounter (Signed)
Patient Advocate Encounter   Received notification from [facility] that prior authorization for [facility] is required.   PA submitted on 08/01/2020 Key [id number] Status is pending    [facility] will continue to follow.   Venida Jarvis. Nadara Mustard, CPhT Patient Advocate [facility] Phone: [phone] Fax:  [phone]

## 2020-08-02 ENCOUNTER — Other Ambulatory Visit: Payer: Self-pay

## 2020-08-02 ENCOUNTER — Ambulatory Visit (INDEPENDENT_AMBULATORY_CARE_PROVIDER_SITE_OTHER): Payer: 59 | Admitting: Internal Medicine

## 2020-08-02 ENCOUNTER — Encounter: Payer: Self-pay | Admitting: Internal Medicine

## 2020-08-02 VITALS — BP 150/70 | HR 72 | Ht 72.0 in | Wt 268.2 lb

## 2020-08-02 DIAGNOSIS — E669 Obesity, unspecified: Secondary | ICD-10-CM

## 2020-08-02 DIAGNOSIS — N183 Chronic kidney disease, stage 3 unspecified: Secondary | ICD-10-CM | POA: Diagnosis not present

## 2020-08-02 DIAGNOSIS — E785 Hyperlipidemia, unspecified: Secondary | ICD-10-CM

## 2020-08-02 DIAGNOSIS — E1122 Type 2 diabetes mellitus with diabetic chronic kidney disease: Secondary | ICD-10-CM | POA: Diagnosis not present

## 2020-08-02 DIAGNOSIS — E1165 Type 2 diabetes mellitus with hyperglycemia: Secondary | ICD-10-CM

## 2020-08-02 DIAGNOSIS — IMO0002 Reserved for concepts with insufficient information to code with codable children: Secondary | ICD-10-CM

## 2020-08-02 DIAGNOSIS — Z794 Long term (current) use of insulin: Secondary | ICD-10-CM | POA: Diagnosis not present

## 2020-08-02 LAB — POCT GLYCOSYLATED HEMOGLOBIN (HGB A1C): Hemoglobin A1C: 8 % — AB (ref 4.0–5.6)

## 2020-08-02 MED ORDER — FREESTYLE LIBRE 2 SENSOR MISC: 1.0000 | 3 refills | Status: DC

## 2020-08-02 MED ORDER — OZEMPIC (1 MG/DOSE) 4 MG/3ML ~~LOC~~ SOPN: 1.0000 mg | PEN_INJECTOR | SUBCUTANEOUS | 3 refills | Status: DC

## 2020-08-02 NOTE — Patient Instructions (Addendum)
Please continue: - Basaglar 55 units at bedtime - Humalog 10-20 units before meals.  Restart: - Ozempic 1 mg weekly  Please return in 3-4 months with with the CGM receiver.
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
## 2020-08-02 NOTE — Addendum Note (Signed)
Addended by: Lauralyn Primes on: 08/02/2020 02:07 PM   Modules accepted: Orders

## 2020-08-29 ENCOUNTER — Telehealth: Payer: Self-pay | Admitting: Internal Medicine

## 2020-08-29 NOTE — Telephone Encounter (Signed)
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 Patient states insurance will cover a different long lasting insulin -one of which is Lantus. Patient states he prefers which ever long lasting insulin Dr. Cruzita Lederer thinks is best and requests that a RX for the long lasting insulin be sent to:   [id number] [place], [place] Phone:  [phone]  Fax:  [phone]

## 2020-08-29 NOTE — Telephone Encounter (Signed)
Ok to send alternate?

## 2020-08-30 ENCOUNTER — Other Ambulatory Visit: Payer: Self-pay | Admitting: Internal Medicine

## 2020-08-30 ENCOUNTER — Other Ambulatory Visit: Payer: Self-pay

## 2020-08-30 ENCOUNTER — Telehealth: Payer: Self-pay

## 2020-08-30 ENCOUNTER — Encounter: Payer: Self-pay | Admitting: Internal Medicine

## 2020-08-30 ENCOUNTER — Ambulatory Visit (INDEPENDENT_AMBULATORY_CARE_PROVIDER_SITE_OTHER): Payer: 59 | Admitting: Internal Medicine

## 2020-08-30 VITALS — BP 138/78 | HR 62 | Temp 97.4°F | Ht 72.0 in | Wt 265.0 lb

## 2020-08-30 DIAGNOSIS — E119 Type 2 diabetes mellitus without complications: Secondary | ICD-10-CM

## 2020-08-30 DIAGNOSIS — E1142 Type 2 diabetes mellitus with diabetic polyneuropathy: Secondary | ICD-10-CM

## 2020-08-30 DIAGNOSIS — I10 Essential (primary) hypertension: Secondary | ICD-10-CM | POA: Diagnosis not present

## 2020-08-30 DIAGNOSIS — N184 Chronic kidney disease, stage 4 (severe): Secondary | ICD-10-CM

## 2020-08-30 DIAGNOSIS — G4733 Obstructive sleep apnea (adult) (pediatric): Secondary | ICD-10-CM

## 2020-08-30 DIAGNOSIS — Z0000 Encounter for general adult medical examination without abnormal findings: Secondary | ICD-10-CM

## 2020-08-30 DIAGNOSIS — Z23 Encounter for immunization: Secondary | ICD-10-CM

## 2020-08-30 DIAGNOSIS — N2581 Secondary hyperparathyroidism of renal origin: Secondary | ICD-10-CM

## 2020-08-30 DIAGNOSIS — Z794 Long term (current) use of insulin: Secondary | ICD-10-CM

## 2020-08-30 LAB — VITAMIN D 25 HYDROXY (VIT D DEFICIENCY, FRACTURES): VITD: 35.17 ng/mL (ref 30.00–100.00)

## 2020-08-30 LAB — RENAL FUNCTION PANEL
Albumin: 3.9 g/dL (ref 3.5–5.2)
BUN: 83 mg/dL — ABNORMAL HIGH (ref 6–23)
CO2: 27 mEq/L (ref 19–32)
Calcium: 9.1 mg/dL (ref 8.4–10.5)
Chloride: 98 mEq/L (ref 96–112)
Creatinine, Ser: 4.41 mg/dL — ABNORMAL HIGH (ref 0.40–1.50)
GFR: 13.92 mL/min — CL (ref >60.00)
Glucose, Bld: 303 mg/dL — ABNORMAL HIGH (ref 70–99)
Phosphorus: 4.5 mg/dL (ref 2.3–4.6)
Potassium: 3.3 mEq/L — ABNORMAL LOW (ref 3.5–5.1)
Sodium: 137 mEq/L (ref 135–145)

## 2020-08-30 LAB — HEPATIC FUNCTION PANEL
ALT: 23 U/L (ref 0–53)
AST: 19 U/L (ref 0–37)
Albumin: 3.9 g/dL (ref 3.5–5.2)
Alkaline Phosphatase: 106 U/L (ref 39–117)
Bilirubin, Direct: 0.1 mg/dL (ref 0.0–0.3)
Total Bilirubin: 0.7 mg/dL (ref 0.2–1.2)
Total Protein: 6.8 g/dL (ref 6.0–8.3)

## 2020-08-30 LAB — CBC
HCT: 36.8 % — ABNORMAL LOW (ref 39.0–52.0)
Hemoglobin: 12.7 g/dL — ABNORMAL LOW (ref 13.0–17.0)
MCHC: 34.6 g/dL (ref 30.0–36.0)
MCV: 83.2 fl (ref 78.0–100.0)
Platelets: 165 K/uL (ref 150.0–400.0)
RBC: 4.42 Mil/uL (ref 4.22–5.81)
RDW: 13.8 % (ref 11.5–15.5)
WBC: 8.1 K/uL (ref 4.0–10.5)

## 2020-08-30 LAB — LIPID PANEL
Cholesterol: 135 mg/dL (ref 0–200)
HDL: 25.5 mg/dL — ABNORMAL LOW (ref >39.00)
Total CHOL/HDL Ratio: 5
Triglycerides: 447 mg/dL — ABNORMAL HIGH (ref 0.0–149.0)

## 2020-08-30 LAB — LDL CHOLESTEROL, DIRECT: Direct LDL: 33 mg/dL

## 2020-08-30 LAB — HM DIABETES FOOT EXAM

## 2020-08-30 MED ORDER — LANTUS SOLOSTAR 100 UNIT/ML ~~LOC~~ SOPN: 55.0000 [IU] | PEN_INJECTOR | Freq: Every day | SUBCUTANEOUS | 3 refills | Status: DC

## 2020-08-30 NOTE — Patient Instructions (Signed)
Please look into the shingrix vaccine.

## 2020-08-30 NOTE — Telephone Encounter (Signed)
[facility] called critical results @ 1115  GFR 13.92

## 2020-08-30 NOTE — Assessment & Plan Note (Signed)
Sleeps well with CPAP
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
## 2020-08-30 NOTE — Assessment & Plan Note (Signed)
Healthy Discussed fitness Update Td today Second COVID booster soon Flu vaccine in the fall He will consider shingrix Colon due next year Had recent PSA--normal Discussed exerciser

## 2020-08-30 NOTE — Addendum Note (Signed)
Addended by: Pilar Grammes on: 08/30/2020 08:14 AM   Modules accepted: Orders

## 2020-08-30 NOTE — Assessment & Plan Note (Signed)
BP Readings from Last 3 Encounters:  08/30/20 138/78  08/02/20 (!) 150/70  04/26/20 140/76   Controlled with hydralazine, doxazosin, metoprolol and torsedmide

## 2020-08-30 NOTE — Assessment & Plan Note (Signed)
Mild No sig pain

## 2020-08-30 NOTE — Assessment & Plan Note (Signed)
Approved for transplant but on hold for now

## 2020-08-30 NOTE — Assessment & Plan Note (Signed)
Is on vitamin D 

## 2020-08-30 NOTE — Telephone Encounter (Signed)
This is not new--though he had improved last time He is going to nephrologist soon

## 2020-08-30 NOTE — Assessment & Plan Note (Signed)
Lab Results  Component Value Date   HGBA1C 8.0 (A) 08/02/2020   Slipped in control but working with Dr Cruzita Lederer to better control

## 2020-08-31 LAB — PARATHYROID HORMONE, INTACT (NO CA): PTH: 249 pg/mL — ABNORMAL HIGH (ref 16–77)

## 2020-09-11 ENCOUNTER — Other Ambulatory Visit: Payer: Self-pay | Admitting: Internal Medicine

## 2020-09-24 ENCOUNTER — Other Ambulatory Visit: Payer: Self-pay | Admitting: Internal Medicine

## 2020-09-25 NOTE — Telephone Encounter (Signed)
Last OV 08/30/20 Last refill 08/01/20 #60/1 Next OV 09/02/21

## 2020-10-01 ENCOUNTER — Telehealth: Payer: Self-pay | Admitting: Internal Medicine

## 2020-10-01 MED ORDER — MOLNUPIRAVIR 200 MG PO CAPS: 4.0000 | ORAL_CAPSULE | Freq: Two times a day (BID) | ORAL | 0 refills | Status: DC

## 2020-10-01 NOTE — Telephone Encounter (Signed)
Spoke to pt

## 2020-10-01 NOTE — Telephone Encounter (Signed)
Started yesterday with head congestion and chest congestion and not feeling well. No fever. Vaccinated and 2 booster. No fever. I did explain to him that Paxlovid and Molnupiravir are FDA emergency authorized and not FDA approved. He knows he would have to take Molnupiravir due to his kidneys. I did let him know that he may not need anything as he is not having a fever or any other symptoms at this time but would pass it on to Dr Silvio Pate. He said he is fine with not taking anything if that is what Dr Silvio Pate thinks.

## 2020-10-01 NOTE — Telephone Encounter (Signed)
Pt tested positive for covid today. No appt available. Pt wanted to know if he could have paxlovid

## 2020-10-01 NOTE — Telephone Encounter (Signed)
Please let him know that I sent the molnupiravir If he has any side effects (like GI)--stop it If worsens, like with sig SOB---to ER or urgent care

## 2020-11-04 ENCOUNTER — Telehealth: Payer: Self-pay | Admitting: Internal Medicine

## 2020-11-04 NOTE — Telephone Encounter (Signed)
[place] [facility] - Client TELEPHONE ADVICE RECORD [facility] Patient Name: Samuel Reed [facility] Gender: Male DOB: Feb 22, 1960 Age: 60 Y 77 M 23 D Return Phone Number: [phone] (Primary), [phone] (Secondary), [phone] (Alternate) Address: [address] City/ State/ Zip: [place] [address] Client [facility] [place] [facility] - Client Client Site [place] - Day Physician Viviana Simpler- MD Contact Type Call Who Is Calling Patient / Member / Family / Caregiver Call Type Triage / Clinical Caller Name Lily Kocher Relationship To Patient Daughter Return Phone Number [phone] (Alternate) Chief Complaint BREATHING - shortness of breath or sounds breathless Reason for Call Symptomatic / Request for [place] states her father is having shortness of breath. He recovered from COVID 4-5 weeks ago. He has kidney disease. Translation No Nurse Assessment Nurse: Hardin Negus, RN, Mardene Celeste Date/Time Eilene Ghazi Time): 11/04/2020 1:27:30 PM Confirm and document reason for call. If symptomatic, describe symptoms. ---Her father is having shortness of breath fatigued. It varies from day to day. He is SOB. He came home from work because he was not feeling well. He recovered from COVID 4-5 weeks ago. He has kidney disease. no fever, coughing. Does the patient have any new or worsening symptoms? ---Yes Will a triage be completed? ---Yes Related visit to physician within the last 2 weeks? ---No Does the PT have any chronic conditions? (i.e. diabetes, asthma, this includes High risk factors for pregnancy, etc.) ---Yes List chronic conditions. ---diabetic, hyperlipidemia, hypertension Is this a behavioral health or substance abuse call? ---No Guidelines Guideline Title Affirmed Question Affirmed Notes Nurse Date/Time (Eastern Time) COVID-19 - Diagnosed or Suspected MODERATE difficulty  breathing (e.g., speaks in phrases, SOB even at rest, pulse 100-120) Hardin Negus, RN, Mardene Celeste 11/04/2020 1:29:36 PM PLEASE NOTE: All timestamps contained within this report are represented as [place] Standard Time. CONFIDENTIALTY NOTICE: This fax transmission is intended only for the addressee. It contains information that is legally privileged, confidential or otherwise protected from use or disclosure. If you are not the intended recipient, you are strictly prohibited from reviewing, disclosing, copying using or disseminating any of this information or taking any action in reliance on or regarding this information. If you have received this fax in error, please notify us immediately by telephone so that we can arrange for its return to [place]. Phone: [phone], Toll-Free: [phone], Fax: [phone] Page: 2 of 2 Call Id: [id number] [place]. Time Eilene Ghazi Time) Disposition Final User 11/04/2020 1:25:39 PM Send to Urgent Queue Hilarie Fredrickson 11/04/2020 1:34:45 PM Paged On Call back to [facility], RN, Mardene Celeste 11/04/2020 1:31:04 PM Go to ED Now Yes Hardin Negus, RN, [place] Disagree/Comply Disagree Caller Understands Yes PreDisposition Call Doctor Care Advice Given Per Guideline GO TO ED NOW: REASSURANCE AND EDUCATION - GOING TO THE ED OR URGENT CARE CENTER DURING THE COVID-19 PANDEMIC: CARE ADVICE given per COVID-19 - DIAGNOSED OR SUSPECTED (Adult) guideline. Comments User: Ledora Bottcher, RN Date/Time Eilene Ghazi Time): 11/04/2020 1:44:13 PM I gave her the phone number for the [facility] MD office Referrals [place] REFUSED Paging [facility] Phone DateTime Result/ Outcome Message Type Notes Queen Slough [phone] 11/04/2020 1:34:45 PM Called On Call Provider - Left Message Doctor Paged Queen Slough 11/04/2020 1:45:15 PM [employer] Notification Received Message Result i was able to obtain the phone numbers foe the  [facility] and [place] office

## 2020-11-04 NOTE — Telephone Encounter (Signed)
Noted, agree with need to be seen.

## 2020-11-04 NOTE — Telephone Encounter (Signed)
Pt is wondering if he can be seen in person, for his prolonged coughing from having covid. He is wanting a chest x-ray. Please advise pt

## 2020-11-04 NOTE — Telephone Encounter (Signed)
I spoke with pts wife (DPR signed) who said that pt has prod cough and not sure of color of phlegm. Pt has SOB upon exertion and had to come home from work due to SOB. Pt is not having CP or wheezing. No other covid symptoms at this time and no fever. Pt had covid 4 - 5 wks ago. Mrs Altice said does not think pt is bad enough to go to ED at this point; pt had SOB about 1 yr ago due to fluid around heart but pt received a pericardial window for that and pts wife wonders if needs CXR due to possible pneumonia. I spoke with Dr Einar Pheasant and she said to let pts wife know that [facility] does not have xray capability this wk and pt will need to go to another facility for xray if Dr Einar Pheasant thinks needs CXR. I advised that if pt did not want to go to ED pt could go to [facility] with xray capability. Pts wife said that she has to work until 5 PM. And she will ck with pt to see what he wants to do but does not want appt with Dr Einar Pheasant for 11/05/20 cancelled at this point. UC & ED precautions given and pts wife voiced understanding.sending note to Dr Einar Pheasant and [facility] CMA.

## 2020-11-05 ENCOUNTER — Ambulatory Visit
Admission: RE | Admit: 2020-11-05 | Discharge: 2020-11-05 | Disposition: A | Discharge status: Home / Self Care | Payer: 59 | Source: Ambulatory Visit | Attending: Family Medicine | Admitting: Family Medicine

## 2020-11-05 ENCOUNTER — Ambulatory Visit (INDEPENDENT_AMBULATORY_CARE_PROVIDER_SITE_OTHER): Payer: 59 | Admitting: Family Medicine

## 2020-11-05 ENCOUNTER — Other Ambulatory Visit: Payer: Self-pay

## 2020-11-05 ENCOUNTER — Telehealth: Payer: Self-pay | Admitting: Family Medicine

## 2020-11-05 ENCOUNTER — Ambulatory Visit
Admission: RE | Admit: 2020-11-05 | Discharge: 2020-11-05 | Disposition: A | Discharge status: Home / Self Care | Payer: 59 | Attending: Family Medicine | Admitting: Family Medicine

## 2020-11-05 VITALS — BP 126/64 | HR 58 | Temp 96.2°F | Ht 72.0 in | Wt 269.0 lb

## 2020-11-05 DIAGNOSIS — N184 Chronic kidney disease, stage 4 (severe): Secondary | ICD-10-CM | POA: Diagnosis not present

## 2020-11-05 DIAGNOSIS — R0609 Other forms of dyspnea: Secondary | ICD-10-CM | POA: Insufficient documentation

## 2020-11-05 LAB — COMPREHENSIVE METABOLIC PANEL
ALT: 20 U/L (ref 0–53)
AST: 18 U/L (ref 0–37)
Albumin: 3.9 g/dL (ref 3.5–5.2)
Alkaline Phosphatase: 90 U/L (ref 39–117)
BUN: 101 mg/dL (ref 6–23)
CO2: 26 mEq/L (ref 19–32)
Calcium: 9.2 mg/dL (ref 8.4–10.5)
Chloride: 100 mEq/L (ref 96–112)
Creatinine, Ser: 4.93 mg/dL (ref 0.40–1.50)
GFR: 12.16 mL/min — CL (ref >60.00)
Glucose, Bld: 201 mg/dL — ABNORMAL HIGH (ref 70–99)
Potassium: 3.7 mEq/L (ref 3.5–5.1)
Sodium: 139 mEq/L (ref 135–145)
Total Bilirubin: 0.8 mg/dL (ref 0.2–1.2)
Total Protein: 7 g/dL (ref 6.0–8.3)

## 2020-11-05 LAB — CBC WITH DIFFERENTIAL/PLATELET
Basophils Absolute: 0.1 10*3/uL (ref 0.0–0.1)
Basophils Relative: 0.6 % (ref 0.0–3.0)
Eosinophils Absolute: 0.3 10*3/uL (ref 0.0–0.7)
Eosinophils Relative: 3.5 % (ref 0.0–5.0)
HCT: 34.4 % — ABNORMAL LOW (ref 39.0–52.0)
Hemoglobin: 11.6 g/dL — ABNORMAL LOW (ref 13.0–17.0)
Lymphocytes Relative: 16.3 % (ref 12.0–46.0)
Lymphs Abs: 1.6 10*3/uL (ref 0.7–4.0)
MCHC: 33.8 g/dL (ref 30.0–36.0)
MCV: 83.3 fl (ref 78.0–100.0)
Monocytes Absolute: 0.8 10*3/uL (ref 0.1–1.0)
Monocytes Relative: 8.1 % (ref 3.0–12.0)
Neutro Abs: 6.9 10*3/uL (ref 1.4–7.7)
Neutrophils Relative %: 71.5 % (ref 43.0–77.0)
Platelets: 220 10*3/uL (ref 150.0–400.0)
RBC: 4.14 Mil/uL — ABNORMAL LOW (ref 4.22–5.81)
RDW: 14.3 % (ref 11.5–15.5)
WBC: 9.7 10*3/uL (ref 4.0–10.5)

## 2020-11-05 LAB — HM DIABETES EYE EXAM

## 2020-11-05 NOTE — Patient Instructions (Addendum)
X-ray later this week Labs today  Work on increasing regular exercise - Start walking 5 minutes per day - for the next 2-3 days - if tolerating add 1 minutes each day to intentional walking   Rest if symptoms occur at earlier times  The goal is to slowly increase how far you can walk without symptoms

## 2020-11-05 NOTE — Progress Notes (Addendum)
Subjective:     Samuel Reed is a 60 y.o. male presenting for Cough (Post covid 6-7 weeks ago. Productive mostly. ) and Shortness of Breath (Upon exertion. O2 dropped to 92% yesterday. )     Cough Associated symptoms include shortness of breath. Pertinent negatives include no chest pain, chills or fever.  Shortness of Breath Pertinent negatives include no chest pain or fever.   #persistent SOB - had covid 09/26/2020 - feels his breathing is not what it used to be - SOB with ambulation - no issues with standing and working or sitting and resting - but worse with walking - wife said he "coughed" all night - in a given week will get SOB 2-3 days out of the week - uses a CPAP - has been cleaning this regularly - endorses wheezing and productive - pipe smoker 40 years - no tobacco since then - swelling in the legs - chronic and intermittent - no sob with laying flat   Sees [name] with [facility]  Review of Systems  Constitutional:  Negative for chills and fever.  Respiratory:  Positive for cough and shortness of breath.   Cardiovascular:  Negative for chest pain and palpitations.    Social History   Tobacco Use  Smoking Status Former   Types: Pipe  Smokeless Tobacco Never        Objective:    BP Readings from Last 3 Encounters:  11/05/20 126/64  08/30/20 138/78  08/02/20 (!) 150/70   Wt Readings from Last 3 Encounters:  11/05/20 269 lb (122 kg)  08/30/20 265 lb (120.2 kg)  08/02/20 268 lb 3.2 oz (121.7 kg)    BP 126/64   Pulse (!) 58   Temp (!) 96.2 F (35.7 C) (Temporal)   Ht 6' (1.829 m)   Wt 269 lb (122 kg)   SpO2 98%   BMI 36.48 kg/m    Physical Exam Constitutional:      Appearance: Normal appearance. He is not ill-appearing or diaphoretic.  HENT:     Head: Normocephalic and atraumatic.     Right Ear: Tympanic membrane and external ear normal.     Left Ear: Tympanic membrane and external ear normal.     Nose: Nose normal.      Mouth/Throat:     Pharynx: No oropharyngeal exudate or posterior oropharyngeal erythema.  Eyes:     General: No scleral icterus.    Comments: Pupils dilated (pt reports eye exam prior to OV)  Cardiovascular:     Rate and Rhythm: Normal rate and regular rhythm.     Heart sounds: Murmur heard.  Pulmonary:     Effort: Pulmonary effort is normal.     Breath sounds: Normal breath sounds. No decreased breath sounds, wheezing, rhonchi or rales.  Musculoskeletal:     Cervical back: Neck supple.  Skin:    General: Skin is warm and dry.  Neurological:     Mental Status: He is alert. Mental status is at baseline.  Psychiatric:        Mood and Affect: Mood normal.    CXR: Some airway thickening but no sign of effusion or consolidation on my read.      Assessment & Plan:   Problem List Items Addressed This Visit       Genitourinary   Chronic renal disease, stage IV ([place])    Per patient this has been stable, though last check was worse than previous. Given intermittent symptoms will reassess and make  sure there has not been significant change.       Relevant Orders   Comprehensive metabolic panel   CBC with Differential     Other   Dyspnea on exertion - Primary    Etiology unclear. Suspect this may be post-covid given timeline, however, he reported improved symptoms and only worsening over 2 weeks. So will elevated for new pneumonia. Symptoms are also intermittent so PE and CHF seems less likely. Remote smoking hx and pt notes wheezing but lungs clear so COPD exacerbation seems less likely. CXR appears normal, will f/u final read      Relevant Orders   Comprehensive metabolic panel   CBC with Differential   DG Chest 2 View    Addendum: Critical lab Creatinine 4.93/BUN 101. Spoke with [name] with nephrology - advised holding Torsemide and potassium for 2 days. Pt updated via phone and expressed understanding. Nephrology will call him for a f/u appt.   Return if symptoms  worsen or fail to improve.  Lesleigh Noe, MD  This visit occurred during the SARS-CoV-2 public health emergency.  Safety protocols were in place, including screening questions prior to the visit, additional usage of staff PPE, and extensive cleaning of exam room while observing appropriate contact time as indicated for disinfecting solutions.

## 2020-11-05 NOTE — Assessment & Plan Note (Signed)
Per patient this has been stable, though last check was worse than previous. Given intermittent symptoms will reassess and make sure there has not been significant change.

## 2020-11-05 NOTE — Telephone Encounter (Signed)
Discussed with nephrology and informed pt  He will hold torsemide x 2 days Nephrology will call for f/u visit

## 2020-11-05 NOTE — Assessment & Plan Note (Addendum)
Etiology unclear. Suspect this may be post-covid given timeline, however, he reported improved symptoms and only worsening over 2 weeks. So will elevated for new pneumonia. Symptoms are also intermittent so PE and CHF seems less likely. Remote smoking hx and pt notes wheezing but lungs clear so COPD exacerbation seems less likely. CXR appears normal, will f/u final read

## 2020-11-05 NOTE — Telephone Encounter (Signed)
CRITICAL VALUE STICKER  CRITICAL VALUE: BUN 101, GFR 12.16, Creatinine 4.93  RECEIVER (on-site recipient of call):Samuel Reed  DATE & TIME NOTIFIED: 11/05/2020 03:58pm  MESSENGER (representative from lab):[email or web address].Ates  MD NOTIFIED: Dr Einar Pheasant  TIME OF NOTIFICATION::00PM   RESPONSE:  :

## 2020-11-07 ENCOUNTER — Telehealth: Payer: Self-pay | Admitting: Internal Medicine

## 2020-11-07 NOTE — Telephone Encounter (Signed)
Patient notified and adjust medication by 10 units

## 2020-11-07 NOTE — Telephone Encounter (Signed)
Samuel Reed, for now, he can try to increase the dose of Lantus to 10 units.   Basaglar is just generic Lantus so I would not expect  the formulary change to affect his blood sugars.

## 2020-11-07 NOTE — Telephone Encounter (Signed)
Patient called and advise he was switched to Lantus from [employer] (for insurance coverage purposes)  Patient advised that his blood sugars are not well controlled with Lantus - his blood sugars are running between 150-200. Is requesting a call back with advice on how to better control BS  Call back [phone]

## 2020-11-07 NOTE — Telephone Encounter (Signed)
Patient was actually switched from [place] to Lantus and states that he had better control on the [place]. Would like to know if you would recommend any changes to get better control. Patient is currently trying to get his meter up and working again.

## 2020-11-27 ENCOUNTER — Telehealth: Payer: Self-pay

## 2020-11-27 NOTE — Telephone Encounter (Signed)
Inbound fax requesting forms be completed and faxed with recent clinical notes. Forms completed and faxed to [place] [facility] at [phone] Clinical notes routed via Epic

## 2020-12-03 ENCOUNTER — Ambulatory Visit: Payer: 59 | Admitting: Internal Medicine

## 2021-01-06 ENCOUNTER — Other Ambulatory Visit: Payer: Self-pay

## 2021-01-06 ENCOUNTER — Ambulatory Visit (INDEPENDENT_AMBULATORY_CARE_PROVIDER_SITE_OTHER): Payer: 59 | Admitting: Internal Medicine

## 2021-01-06 ENCOUNTER — Encounter: Payer: Self-pay | Admitting: Internal Medicine

## 2021-01-06 VITALS — BP 174/76 | HR 71 | Ht 72.0 in | Wt 270.8 lb

## 2021-01-06 DIAGNOSIS — E1165 Type 2 diabetes mellitus with hyperglycemia: Secondary | ICD-10-CM

## 2021-01-06 DIAGNOSIS — E785 Hyperlipidemia, unspecified: Secondary | ICD-10-CM

## 2021-01-06 DIAGNOSIS — E1142 Type 2 diabetes mellitus with diabetic polyneuropathy: Secondary | ICD-10-CM | POA: Diagnosis not present

## 2021-01-06 DIAGNOSIS — E669 Obesity, unspecified: Secondary | ICD-10-CM

## 2021-01-06 LAB — LIPID PANEL
Cholesterol: 105 mg/dL (ref 0–200)
HDL: 32.9 mg/dL — ABNORMAL LOW (ref >39.00)
LDL Cholesterol: 36 mg/dL (ref 0–99)
NonHDL: 71.7
Total CHOL/HDL Ratio: 3
Triglycerides: 180 mg/dL — ABNORMAL HIGH (ref 0.0–149.0)
VLDL: 36 mg/dL (ref 0.0–40.0)

## 2021-01-06 MED ORDER — FREESTYLE LIBRE 2 SENSOR MISC: 1.0000 | 3 refills | Status: DC

## 2021-01-06 NOTE — Patient Instructions (Addendum)
Please continue: - Lantus 65 units at bedtime - Humalog 10-20 units before meals - Ozempic 1 mg weekly  Please stop at the lab.  Please return in 3 months.
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
## 2021-01-07 ENCOUNTER — Other Ambulatory Visit: Payer: Self-pay | Admitting: Internal Medicine

## 2021-01-07 LAB — POCT GLYCOSYLATED HEMOGLOBIN (HGB A1C): Hemoglobin A1C: 8.1 % — AB (ref 4.0–5.6)

## 2021-02-23 ENCOUNTER — Emergency Department (HOSPITAL_BASED_OUTPATIENT_CLINIC_OR_DEPARTMENT_OTHER)
Admission: EM | Admit: 2021-02-23 | Discharge: 2021-02-24 | Disposition: A | Discharge status: Home / Self Care | Payer: 59 | Attending: Emergency Medicine | Admitting: Emergency Medicine

## 2021-02-23 ENCOUNTER — Encounter (HOSPITAL_BASED_OUTPATIENT_CLINIC_OR_DEPARTMENT_OTHER): Payer: Self-pay | Admitting: *Deleted

## 2021-02-23 ENCOUNTER — Other Ambulatory Visit: Payer: Self-pay

## 2021-02-23 DIAGNOSIS — E118 Type 2 diabetes mellitus with unspecified complications: Secondary | ICD-10-CM

## 2021-02-23 DIAGNOSIS — E1122 Type 2 diabetes mellitus with diabetic chronic kidney disease: Secondary | ICD-10-CM | POA: Insufficient documentation

## 2021-02-23 DIAGNOSIS — N184 Chronic kidney disease, stage 4 (severe): Secondary | ICD-10-CM | POA: Diagnosis not present

## 2021-02-23 DIAGNOSIS — Z7982 Long term (current) use of aspirin: Secondary | ICD-10-CM | POA: Diagnosis not present

## 2021-02-23 DIAGNOSIS — L84 Corns and callosities: Secondary | ICD-10-CM | POA: Insufficient documentation

## 2021-02-23 DIAGNOSIS — Z794 Long term (current) use of insulin: Secondary | ICD-10-CM | POA: Diagnosis not present

## 2021-02-23 DIAGNOSIS — B353 Tinea pedis: Secondary | ICD-10-CM | POA: Insufficient documentation

## 2021-02-23 DIAGNOSIS — Z79899 Other long term (current) drug therapy: Secondary | ICD-10-CM | POA: Insufficient documentation

## 2021-02-23 NOTE — ED Triage Notes (Addendum)
Pt c/o "ulcer" to his left foot. States he has had this issue in the past. Hx of diabetes. Denies any fevers or pain from wound. Pt has approx quarter size circular hardened area of skin to his left posterior foot. No drainage noted.

## 2021-02-24 ENCOUNTER — Telehealth: Payer: Self-pay

## 2021-02-24 ENCOUNTER — Emergency Department (HOSPITAL_BASED_OUTPATIENT_CLINIC_OR_DEPARTMENT_OTHER): Payer: 59

## 2021-02-24 ENCOUNTER — Encounter (HOSPITAL_BASED_OUTPATIENT_CLINIC_OR_DEPARTMENT_OTHER): Payer: Self-pay | Admitting: Emergency Medicine

## 2021-02-24 MED ORDER — DOXYCYCLINE HYCLATE 100 MG PO CAPS: 100.0000 mg | ORAL_CAPSULE | Freq: Two times a day (BID) | ORAL | 0 refills | Status: DC

## 2021-02-24 MED ORDER — DOXYCYCLINE HYCLATE 100 MG PO TABS
100.0000 mg | ORAL_TABLET | Freq: Once | ORAL | Status: AC
  Administered 2021-02-24: 100 mg via ORAL
  Filled 2021-02-24: qty 1

## 2021-02-24 NOTE — Telephone Encounter (Signed)
Spoke to pt about recent ER visit. He said the area is getting better. He is seeing podiatry 02-26-21. Appreciated the call.

## 2021-02-24 NOTE — ED Provider Notes (Signed)
[facility] EMERGENCY DEPT Provider Note   CSN: [id number] Arrival date & time: 02/23/21  2304     History    Samuel Reed is a 61 y.o. male.  The history is provided by the patient.  Illness Location:  Left fith metatarsal, plantar aspect Quality:  Callous Severity:  Moderate Duration: has been there for years and has been debrided by podiatry but looked larger and darker today. Timing:  Constant Progression:  Worsening Chronicity:  Chronic Context:  Diabetic with stage 4 kidney disease Relieved by:  Nothing Worsened by:  Nothing Ineffective treatments:  None Associated symptoms: no congestion, no cough, no fever and no nausea   Risk factors:  Diabetes and kidney disease.     Home Medications Prior to Admission medications   Medication Sig Start Date End Date Taking? Authorizing Provider  amLODipine (NORVASC) 10 MG tablet TAKE 1 TABLET BY MOUTH EVERY DAY 07/15/20   Viviana Simpler I, MD  aspirin EC 81 MG EC tablet Take 1 tablet (81 mg total) by mouth daily. 01/19/19   Sande Rives E, PA-C  BD PEN NEEDLE NANO 2ND GEN 32G X 4 MM MISC USE TO INJECT INSULIN 4 TIMES A DAY 01/24/20   Philemon Kingdom, MD  Cholecalciferol (VITAMIN D3) 50 MCG (2000 UT) TABS Take 2,000 Units by mouth daily with breakfast.    [provider]  Continuous Blood Gluc Receiver (FREESTYLE LIBRE 2 READER) DEVI 1 each by Does not apply route daily. 03/14/19   Philemon Kingdom, MD  Continuous Blood Gluc Sensor (FREESTYLE LIBRE 2 SENSOR) MISC 1 each by Does not apply route every 14 (fourteen) days. 01/06/21   Philemon Kingdom, MD  doxazosin (CARDURA) 4 MG tablet TAKE 1 TABLET BY MOUTH AT BEDTIME. 09/11/20   [name], MD  Ferrous Gluconate 324 (37.5 Fe) MG TABS Take 2 tablets by mouth daily.     [provider]  fluticasone (FLONASE) 50 MCG/ACT nasal spray PLACE 1 SPRAY INTO BOTH NOSTRILS 2 (TWO) TIMES DAILY. 09/26/18   [name], MD  hydrALAZINE  (APRESOLINE) 100 MG tablet Take 100 mg by mouth 3 (three) times daily.    [provider]  insulin glargine (LANTUS SOLOSTAR) 100 UNIT/ML Solostar Pen Inject 55 Units into the skin daily. 08/30/20   Philemon Kingdom, MD  insulin lispro (HUMALOG KWIKPEN) 100 UNIT/ML KwikPen INJECT 10-20 UNITS TOTAL INTO THE SKIN 3 (THREE) TIMES DAILY BEFORE MEALS. 01/07/21   Philemon Kingdom, MD  KLOR-CON M20 20 MEQ tablet Take 20 mEq by mouth daily. 08/20/19   [provider]  loratadine (CLARITIN) 5 MG/5ML syrup Take 5 mg by mouth daily as needed for allergies or rhinitis.    [provider]  metolazone (ZAROXOLYN) 5 MG tablet Take 5 mg by mouth daily as needed (for extreme swelling).    [provider]  metoprolol tartrate (LOPRESSOR) 100 MG tablet TAKE 1 TABLET BY MOUTH TWICE A DAY 09/25/20   [name], MD  PRESCRIPTION MEDICATION CPAP: At bedtime    [provider]  rosuvastatin (CRESTOR) 20 MG tablet TAKE 1 TABLET BY MOUTH EVERY DAY 07/15/20   [name], MD  Semaglutide, 1 MG/DOSE, (OZEMPIC, 1 MG/DOSE,) 4 MG/3ML SOPN Inject 1 mg into the skin once a week. 08/02/20   Philemon Kingdom, MD  torsemide (DEMADEX) 20 MG tablet Take 40 mg by mouth daily.  10/05/17   [provider]      Allergies    Patient has no known allergies.  Review of Systems   Review of Systems  Constitutional:  Negative for fever.  HENT:  Negative for congestion.   Respiratory:  Negative for cough.   Gastrointestinal:  Negative for nausea.   Physical Exam Updated Vital Signs BP (!) 175/76 (BP Location: Right Arm)    Pulse 74    Temp 98.6 F (37 C) (Oral)    Resp 18    Ht 6' (1.829 m)    Wt 120.2 kg    SpO2 100%    BMI 35.94 kg/m  Physical Exam Vitals and nursing note reviewed.  Constitutional:      General: He is not in acute distress.    Appearance: Normal appearance.  HENT:     Head: Normocephalic and atraumatic.     Nose: Nose normal.  Eyes:      Conjunctiva/sclera: Conjunctivae normal.     Pupils: Pupils are equal, round, and reactive to light.  Cardiovascular:     Rate and Rhythm: Normal rate and regular rhythm.     Pulses: Normal pulses.     Heart sounds: Normal heart sounds.  Pulmonary:     Effort: Pulmonary effort is normal.     Breath sounds: Normal breath sounds.  Abdominal:     General: Abdomen is flat. Bowel sounds are normal.     Palpations: Abdomen is soft.     Tenderness: There is no abdominal tenderness. There is no guarding.  Musculoskeletal:        General: Normal range of motion.     Cervical back: Normal range of motion and neck supple.     Left ankle:     Left Achilles Tendon: Normal.     Left foot: Normal range of motion and normal capillary refill. No swelling, deformity, bunion, Charcot foot, foot drop, laceration, tenderness, bony tenderness or crepitus. Normal pulse.       Feet:     Comments: Athletes foot noted   Skin:    General: Skin is warm and dry.     Capillary Refill: Capillary refill takes less than 2 seconds.  Neurological:     General: No focal deficit present.     Mental Status: He is alert and oriented to person, place, and time.     Deep Tendon Reflexes: Reflexes normal.  Psychiatric:        Mood and Affect: Mood normal.        Behavior: Behavior normal.    ED Results / Procedures / Treatments   Labs (all labs ordered are listed, but only abnormal results are displayed) Labs Reviewed - No data to display  EKG None  Radiology DG Foot Complete Left  Result Date: 02/24/2021 CLINICAL DATA:  Left foot ulcer, initial encounter EXAM: LEFT FOOT - COMPLETE 3+ VIEW COMPARISON:  None. FINDINGS: Calcaneal spurring and tarsal degenerative changes noted. Mild distal soft tissue swelling is seen. Definitive bony erosive changes to suggest osteomyelitis are noted. No fracture or dislocation is seen. Healed fifth metatarsal fracture is again noted. IMPRESSION: Chronic changes without acute  abnormality. Electronically Signed   By: Inez Catalina M.D.   On: 02/24/2021 00:35      Medications Ordered in ED Medications  doxycycline (VIBRA-TABS) tablet 100 mg (100 mg Oral Given 02/24/21 0029)    ED Course/ Medical Decision Making/ A&P                           Medical Decision Making Callous there for years looked  at it tonight looked bigger and darker and came in.  No f/c/r  Amount and/or Complexity of Data Reviewed Radiology: ordered.  Risk Prescription drug management. Risk Details: No signs of deep infection on exam or xrays.  No fever, no eschar.  No warmth no drainage.  I do not see signs of deep infection at this time.   I explained we do not debride callouses in the ED.  I have referred patient to wound care to get established and have instructed close follow up with PMD and podiatry.  I will start doxyxlycine and have advised checking area daily padding area and getting diabetic shoes.  Strict return precautions given      Final Clinical Impression(s) / ED Diagnoses   Return for intractable cough, coughing up blood, fevers > 100.4 unrelieved by medication, shortness of breath, intractable vomiting, chest pain, shortness of breath, weakness, numbness, changes in speech, facial asymmetry, abdominal pain, passing out, Inability to tolerate liquids or food, cough, altered mental status or any concerns. No signs of systemic illness or infection. The patient is nontoxic-appearing on exam and vital signs are within normal limits.  I have reviewed the triage vital signs and the nursing notes. Pertinent labs & imaging results that were available during my care of the patient were reviewed by me and considered in my medical decision making (see chart for details). After history, exam, and medical workup I feel the patient has been appropriately medically screened and is safe for discharge home. Pertinent diagnoses were discussed with the patient. Patient was given return precautions.            Rashell Shambaugh, MD 02/24/21 5638

## 2021-02-24 NOTE — ED Notes (Signed)
EMT-P provided AVS using Teachback Method. Patient verbalizes understanding of Discharge Instructions. Opportunity for Questioning and Answers were provided by EMT-P. Patient Discharged from ED.  ? ?

## 2021-02-24 NOTE — Discharge Instructions (Signed)
Keep foot clean and dry pad with moleskin and wear diabetic shoes.  Follow up with wound care and podiatry.

## 2021-02-26 ENCOUNTER — Ambulatory Visit: Payer: 59 | Admitting: Podiatry

## 2021-02-26 ENCOUNTER — Other Ambulatory Visit: Payer: Self-pay

## 2021-02-26 DIAGNOSIS — E0843 Diabetes mellitus due to underlying condition with diabetic autonomic (poly)neuropathy: Secondary | ICD-10-CM

## 2021-02-26 DIAGNOSIS — L97522 Non-pressure chronic ulcer of other part of left foot with fat layer exposed: Secondary | ICD-10-CM

## 2021-02-26 MED ORDER — GENTAMICIN SULFATE 0.1 % EX CREA: 1.0000 "application " | TOPICAL_CREAM | Freq: Two times a day (BID) | CUTANEOUS | 1 refills | Status: AC

## 2021-02-26 NOTE — Progress Notes (Signed)
° °  Subjective:  61 y.o. male with PMHx of diabetes mellitus presenting today for new complaint regarding an ulcer that developed to the patient's left forefoot over the past few days.  He went to the emergency department on 02/24/2021 where x-rays were taken and he was placed on oral doxycycline and referred here.  He presents for further treatment and evaluation.  He denies a history of injury.   Past Medical History:  Diagnosis Date   Allergy    Anemia    Chronic venous insufficiency    CKD (chronic kidney disease), stage III ([place])    Diabetes mellitus type II 2003   HLD (hyperlipidemia)    HTN (hypertension) 1980's   Neuromuscular disorder (HCC)    neuropathy feet    OSA (obstructive sleep apnea)    Pericardial effusion    S/P pericardial window 01/16/2019   Sleep apnea    wear cpap        Objective/Physical Exam General: The patient is alert and oriented x3 in no acute distress.  Dermatology:  Wound #1 noted to the 2.5 x 2.0 x 0.2 cm (LxWxD).   To the noted ulceration(s), there is no eschar. There is a moderate amount of slough, fibrin, and necrotic tissue noted. Granulation tissue and wound base is red. There is a minimal amount of serosanguineous drainage noted. There is no exposed bone muscle-tendon ligament or joint. There is no malodor. Periwound integrity is intact. Skin is warm, dry and supple bilateral lower extremities.  Vascular: Palpable pedal pulses bilaterally. No edema or erythema noted. Capillary refill within normal limits.  Neurological: Epicritic and protective threshold diminished bilaterally.   Musculoskeletal Exam: Range of motion within normal limits to all pedal and ankle joints bilateral. Muscle strength 5/5 in all groups bilateral.   Assessment: 1.  Ulcer left foot secondary to diabetes mellitus 2. diabetes mellitus w/ peripheral neuropathy   Plan of Care:  1. Patient was evaluated.  X-rays reviewed that were taken 02/24/2021 in the ED 2.  medically necessary excisional debridement including subcutaneous tissue was performed using a tissue nipper and a chisel blade. Excisional debridement of all the necrotic nonviable tissue down to healthy bleeding viable tissue was performed with post-debridement measurements same as pre-. 3. the wound was cleansed and dry sterile dressing applied. 4.  Prescription for gentamicin cream  5.  Continue oral doxycycline as prescribed  6.  Offloading felt metatarsal pads were applied to the insoles of the shoes wear daily  7.  Patient is to return to clinic in 3 weeks   Edrick Kins, DPM [facility]  Dr. Edrick Kins, DPM    [address], [place] [address]                Office [phone]  Fax [phone]

## 2021-03-09 ENCOUNTER — Encounter (HOSPITAL_COMMUNITY): Payer: Self-pay | Admitting: Emergency Medicine

## 2021-03-09 ENCOUNTER — Other Ambulatory Visit: Payer: Self-pay

## 2021-03-09 ENCOUNTER — Emergency Department (HOSPITAL_COMMUNITY): Payer: 59

## 2021-03-09 ENCOUNTER — Observation Stay (HOSPITAL_COMMUNITY)
Admission: EM | Admit: 2021-03-09 | Discharge: 2021-03-11 | Disposition: A | Discharge status: Home / Self Care | Payer: 59 | Attending: Internal Medicine | Admitting: Internal Medicine

## 2021-03-09 DIAGNOSIS — Z20822 Contact with and (suspected) exposure to covid-19: Secondary | ICD-10-CM | POA: Diagnosis not present

## 2021-03-09 DIAGNOSIS — L97529 Non-pressure chronic ulcer of other part of left foot with unspecified severity: Secondary | ICD-10-CM | POA: Diagnosis present

## 2021-03-09 DIAGNOSIS — I10 Essential (primary) hypertension: Secondary | ICD-10-CM | POA: Diagnosis present

## 2021-03-09 DIAGNOSIS — Z7982 Long term (current) use of aspirin: Secondary | ICD-10-CM | POA: Insufficient documentation

## 2021-03-09 DIAGNOSIS — G4733 Obstructive sleep apnea (adult) (pediatric): Secondary | ICD-10-CM

## 2021-03-09 DIAGNOSIS — Z79899 Other long term (current) drug therapy: Secondary | ICD-10-CM | POA: Diagnosis not present

## 2021-03-09 DIAGNOSIS — Z833 Family history of diabetes mellitus: Secondary | ICD-10-CM

## 2021-03-09 DIAGNOSIS — E1122 Type 2 diabetes mellitus with diabetic chronic kidney disease: Secondary | ICD-10-CM

## 2021-03-09 DIAGNOSIS — R0602 Shortness of breath: Secondary | ICD-10-CM | POA: Diagnosis present

## 2021-03-09 DIAGNOSIS — E785 Hyperlipidemia, unspecified: Secondary | ICD-10-CM | POA: Diagnosis present

## 2021-03-09 DIAGNOSIS — L97522 Non-pressure chronic ulcer of other part of left foot with fat layer exposed: Secondary | ICD-10-CM | POA: Diagnosis present

## 2021-03-09 DIAGNOSIS — R17 Unspecified jaundice: Secondary | ICD-10-CM

## 2021-03-09 DIAGNOSIS — R197 Diarrhea, unspecified: Secondary | ICD-10-CM | POA: Diagnosis present

## 2021-03-09 DIAGNOSIS — N289 Disorder of kidney and ureter, unspecified: Secondary | ICD-10-CM

## 2021-03-09 DIAGNOSIS — N185 Chronic kidney disease, stage 5: Secondary | ICD-10-CM | POA: Diagnosis not present

## 2021-03-09 DIAGNOSIS — Z8249 Family history of ischemic heart disease and other diseases of the circulatory system: Secondary | ICD-10-CM

## 2021-03-09 DIAGNOSIS — I5033 Acute on chronic diastolic (congestive) heart failure: Principal | ICD-10-CM

## 2021-03-09 DIAGNOSIS — E876 Hypokalemia: Secondary | ICD-10-CM | POA: Diagnosis present

## 2021-03-09 DIAGNOSIS — Z794 Long term (current) use of insulin: Secondary | ICD-10-CM | POA: Diagnosis not present

## 2021-03-09 DIAGNOSIS — I501 Left ventricular failure: Secondary | ICD-10-CM | POA: Diagnosis present

## 2021-03-09 DIAGNOSIS — E11621 Type 2 diabetes mellitus with foot ulcer: Secondary | ICD-10-CM | POA: Diagnosis present

## 2021-03-09 DIAGNOSIS — I130 Hypertensive heart and chronic kidney disease with heart failure and stage 1 through stage 4 chronic kidney disease, or unspecified chronic kidney disease: Secondary | ICD-10-CM | POA: Insufficient documentation

## 2021-03-09 DIAGNOSIS — I132 Hypertensive heart and chronic kidney disease with heart failure and with stage 5 chronic kidney disease, or end stage renal disease: Secondary | ICD-10-CM | POA: Diagnosis not present

## 2021-03-09 DIAGNOSIS — I248 Other forms of acute ischemic heart disease: Secondary | ICD-10-CM | POA: Diagnosis present

## 2021-03-09 DIAGNOSIS — E1169 Type 2 diabetes mellitus with other specified complication: Secondary | ICD-10-CM | POA: Diagnosis present

## 2021-03-09 DIAGNOSIS — Z825 Family history of asthma and other chronic lower respiratory diseases: Secondary | ICD-10-CM

## 2021-03-09 DIAGNOSIS — N186 End stage renal disease: Secondary | ICD-10-CM | POA: Diagnosis present

## 2021-03-09 DIAGNOSIS — I4510 Unspecified right bundle-branch block: Secondary | ICD-10-CM | POA: Diagnosis present

## 2021-03-09 DIAGNOSIS — I5032 Chronic diastolic (congestive) heart failure: Secondary | ICD-10-CM | POA: Diagnosis present

## 2021-03-09 DIAGNOSIS — E861 Hypovolemia: Secondary | ICD-10-CM | POA: Diagnosis present

## 2021-03-09 DIAGNOSIS — R778 Other specified abnormalities of plasma proteins: Secondary | ICD-10-CM

## 2021-03-09 DIAGNOSIS — Z87891 Personal history of nicotine dependence: Secondary | ICD-10-CM

## 2021-03-09 DIAGNOSIS — R7989 Other specified abnormal findings of blood chemistry: Secondary | ICD-10-CM | POA: Diagnosis present

## 2021-03-09 DIAGNOSIS — Z8042 Family history of malignant neoplasm of prostate: Secondary | ICD-10-CM

## 2021-03-09 DIAGNOSIS — D631 Anemia in chronic kidney disease: Secondary | ICD-10-CM | POA: Diagnosis present

## 2021-03-09 NOTE — ED Provider Notes (Signed)
[facility] EMERGENCY DEPARTMENT Provider Note   CSN: [id number] Arrival date & time: 03/09/21  2234     History  Chief Complaint  Patient presents with   Shortness of Breath    Samuel Reed is a 61 y.o. male.  The history is provided by the patient.  Shortness of Breath He has history of hypertension, diabetes, hyperlipidemia, stage IV chronic kidney disease and comes in because of shortness of breath for the last 3 days.  Symptoms got worse today.  Dyspnea is worse with any exertion.  He has had low-grade fevers and chills but no sweats.  There has been a cough productive of sputum which she swallows.  He denies chest pain, heaviness, tightness, pressure.  He denies any nausea or vomiting.  He denies any sick contacts.  He did have a few episodes of diarrhea today.  He took a home COVID test today which was negative.   Home Medications Prior to Admission medications   Medication Sig Start Date End Date Taking? Authorizing Provider  amLODipine (NORVASC) 10 MG tablet TAKE 1 TABLET BY MOUTH EVERY DAY Patient taking differently: Take 10 mg by mouth daily. 07/15/20  Yes Viviana Simpler I, MD  aspirin EC 81 MG EC tablet Take 1 tablet (81 mg total) by mouth daily. 01/19/19  Yes Sande Rives E, PA-C  Cholecalciferol (VITAMIN D3) 50 MCG (2000 UT) TABS Take 2,000 Units by mouth daily with breakfast.   Yes [provider]  doxazosin (CARDURA) 4 MG tablet TAKE 1 TABLET BY MOUTH AT BEDTIME. Patient taking differently: Take 4 mg by mouth at bedtime. 09/11/20  Yes [name], MD  Ferrous Gluconate 324 (37.5 Fe) MG TABS Take 2 tablets by mouth daily.    Yes [provider]  fluticasone (FLONASE) 50 MCG/ACT nasal spray PLACE 1 SPRAY INTO BOTH NOSTRILS 2 (TWO) TIMES DAILY. Patient taking differently: Place 2 sprays into both nostrils 2 (two) times daily. 09/26/18  Yes [name], MD  gentamicin cream (GARAMYCIN) 0.1 % Apply 1 application  topically 2 (two) times daily. 02/26/21  Yes Edrick Kins, DPM  hydrALAZINE (APRESOLINE) 100 MG tablet Take 100 mg by mouth 3 (three) times daily.   Yes [provider]  insulin glargine (LANTUS SOLOSTAR) 100 UNIT/ML Solostar Pen Inject 55 Units into the skin daily. 08/30/20  Yes Philemon Kingdom, MD  insulin lispro (HUMALOG KWIKPEN) 100 UNIT/ML KwikPen INJECT 10-20 UNITS TOTAL INTO THE SKIN 3 (THREE) TIMES DAILY BEFORE MEALS. Patient taking differently: Inject 20 Units into the skin 3 (three) times daily. INJECT 10-20 UNITS TOTAL INTO THE SKIN 3 (THREE) TIMES DAILY BEFORE MEALS. 01/07/21  Yes Philemon Kingdom, MD  KLOR-CON M20 20 MEQ tablet Take 20 mEq by mouth daily. 08/20/19  Yes [provider]  loratadine (CLARITIN) 5 MG/5ML syrup Take 5 mg by mouth daily as needed for allergies or rhinitis.   Yes [provider]  metolazone (ZAROXOLYN) 5 MG tablet Take 5 mg by mouth daily as needed (for extreme swelling).   Yes [provider]  rosuvastatin (CRESTOR) 20 MG tablet TAKE 1 TABLET BY MOUTH EVERY DAY Patient taking differently: Take 20 mg by mouth daily. 07/15/20  Yes [name], MD  Semaglutide, 1 MG/DOSE, (OZEMPIC, 1 MG/DOSE,) 4 MG/3ML SOPN Inject 1 mg into the skin once a week. 08/02/20  Yes Philemon Kingdom, MD  torsemide (DEMADEX) 20 MG tablet Take 40 mg by mouth daily.  10/05/17  Yes [provider]  BD PEN NEEDLE NANO 2ND GEN 32G X 4 MM MISC USE TO INJECT INSULIN 4 TIMES A DAY 01/24/20   Philemon Kingdom, MD  Continuous Blood Gluc Receiver (FREESTYLE LIBRE 2 READER) DEVI 1 each by Does not apply route daily. 03/14/19   Philemon Kingdom, MD  Continuous Blood Gluc Sensor (FREESTYLE LIBRE 2 SENSOR) MISC 1 each by Does not apply route every 14 (fourteen) days. 01/06/21   Philemon Kingdom, MD  doxycycline (VIBRAMYCIN) 100 MG capsule Take 1 capsule (100 mg total) by mouth 2 (two) times daily. One po bid x 7 days Patient not taking: Reported on  03/09/2021 02/24/21   Palumbo, April, MD  metoprolol tartrate (LOPRESSOR) 100 MG tablet TAKE 1 TABLET BY MOUTH TWICE A DAY Patient taking differently: Take 100 mg by mouth 2 (two) times daily. 09/25/20   [name], MD  PRESCRIPTION MEDICATION CPAP: At bedtime    [provider]      Allergies    Patient has no known allergies.    Review of Systems   Review of Systems  Respiratory:  Positive for shortness of breath.   All other systems reviewed and are negative.  Physical Exam Updated Vital Signs BP (!) 161/67    Pulse 73    Temp 99.1 F (37.3 C) (Oral)    Resp 11    SpO2 97%  Physical Exam Vitals and nursing note reviewed.  61 year old male, resting comfortably and in no acute distress. Vital signs are significant for elevated blood pressure. Oxygen saturation is 97%, which is normal, but only obtained while on supplemental nasal oxygen-on room air oxygen saturation was 90% which is borderline hypoxic. Head is normocephalic and atraumatic. PERRLA, EOMI. Oropharynx is clear. Neck is nontender and supple without adenopathy or JVD. Back is nontender and there is no CVA tenderness.  There is 1+ presacral edema. Lungs have bibasilar rales.  There are no wheezes or rhonchi. Chest is nontender. Heart has regular rate and rhythm without murmur. Abdomen is soft, flat, nontender without masses or hepatosplenomegaly and peristalsis is normoactive. Extremities have 2+ edema, full range of motion is present. Skin is warm and dry without rash. Neurologic: Mental status is normal, cranial nerves are intact, moves all extremities equally.  ED Results / Procedures / Treatments   Labs (all labs ordered are listed, but only abnormal results are displayed) Labs Reviewed  COMPREHENSIVE METABOLIC PANEL - Abnormal; Notable for the following components:      Result Value   CO2 19 (*)    Glucose, Bld 160 (*)    BUN 79 (*)    Creatinine, Ser 4.72 (*)    Albumin 3.4 (*)    Total  Bilirubin 2.3 (*)    GFR, Estimated 13 (*)    All other components within normal limits  BRAIN NATRIURETIC PEPTIDE - Abnormal; Notable for the following components:   B Natriuretic Peptide 889.9 (*)    All other components within normal limits  CBC WITH DIFFERENTIAL/PLATELET - Abnormal; Notable for the following components:   WBC 10.8 (*)    RBC 3.87 (*)    Hemoglobin 11.1 (*)    HCT 35.3 (*)    Neutro Abs 9.2 (*)    Abs Immature Granulocytes 0.08 (*)    All other components within normal limits  TROPONIN I (HIGH SENSITIVITY) - Abnormal; Notable for the following components:   Troponin I (High Sensitivity) 55 (*)    All other components within normal limits  TROPONIN I (HIGH  SENSITIVITY) - Abnormal; Notable for the following components:   Troponin I (High Sensitivity) 52 (*)    All other components within normal limits  RESP PANEL BY RT-PCR (FLU A&B, COVID) [id number]    EKG EKG Interpretation  Date/Time:  Sunday March 09 2021 22:49:28 EST Ventricular Rate:  73 PR Interval:  222 QRS Duration: 158 QT Interval:  440 QTC Calculation: 484 R Axis:   -87 Text Interpretation: Sinus rhythm with 1st degree A-V block Left axis deviation Right bundle branch block Abnormal ECG When compared with ECG of 17-Jan-2019 17:43, Premature atrial complexes are no longer present Confirmed by Delora Fuel ([id number]) on 03/09/2021 11:13:35 PM  Radiology DG Chest Port 1 View  Result Date: 03/10/2021 CLINICAL DATA:  Shortness of breath EXAM: PORTABLE CHEST 1 VIEW COMPARISON:  11/05/2020 FINDINGS: Heart is borderline in size. Bilateral perihilar and lower lobe airspace opacities could reflect edema or infection. No visible significant effusions or pneumothorax. No acute bony abnormality. IMPRESSION: Bilateral perihilar and lower lobe opacities could reflect edema or infection Electronically Signed   By: Rolm Baptise M.D.   On: 03/10/2021 00:02    Procedures Procedures  Cardiac monitor, per my  interpretation, shows normal sinus rhythm.  Medications Ordered in ED Medications - No data to display  ED Course/ Medical Decision Making/ A&P                           Medical Decision Making Amount and/or Complexity of Data Reviewed Labs: ordered. Radiology: ordered.  Risk Prescription drug management. Decision regarding hospitalization.   Dyspnea with rales and peripheral edema strongly suggestive of heart failure.  We will check troponin to rule out occult myocardial infarction.  Chest x-ray will be obtained to look for evidence of heart failure as well as pneumonia.  We will also check a BNP to evaluate for heart failure.  We will check respiratory pathogen panel to assess for possible influenza or COVID-19.  No risk factors for pulmonary embolism, normal heart rate and respiratory rate argue against pulmonary embolism.  Old records are reviewed, and he had an office visit for dyspnea on exertion on 11/05/2020 at which time chest x-ray showed no evidence of pneumonia or heart failure  ECG shows right bundle branch block, no acute changes.  Chest x-ray is consistent with heart failure.  I have independently viewed the image, and agree with the radiologist's interpretation.  Labs are significant for mildly elevated troponin and a markedly elevated BNP.  Elevated troponin is felt to be secondary to a combination of demand ischemia and renal failure, and felt not to represent ACS.  Metabolic panel shows renal failure similar to previous's value.  Elevation of bilirubin is felt most likely to be secondary to heart failure and passive congestion of the liver.  Respiratory pathogen panel is negative for influenza and COVID-19.  Given degree of renal insufficiency, I do not feel he can safely be diuresed at home.  He is given a dose of furosemide and will make arrangements for hospital admission.  Case is discussed with Dr. Cyd Silence of [facility] hospitalist, who agrees to admit the  patient.        Final Clinical Impression(s) / ED Diagnoses Final diagnoses:  Acute on chronic diastolic heart failure (HCC)  Renal insufficiency  Elevated troponin  Serum total bilirubin elevated    Rx / DC Orders ED Discharge Orders     None  Delora Fuel, MD [birth year]47 [phone]

## 2021-03-09 NOTE — ED Notes (Signed)
Pts oxygen saturation 90% on room air. Placed on 2Liters oxygen via nasal cannula. Oxygen saturation increased to 97%.

## 2021-03-09 NOTE — ED Triage Notes (Signed)
Pt c/o increased shortness of breath, worse with ambulation x 2 days. Denies chest pain.

## 2021-03-10 ENCOUNTER — Inpatient Hospital Stay (HOSPITAL_COMMUNITY): Payer: 59

## 2021-03-10 ENCOUNTER — Encounter (HOSPITAL_COMMUNITY): Payer: Self-pay | Admitting: Internal Medicine

## 2021-03-10 DIAGNOSIS — Z79899 Other long term (current) drug therapy: Secondary | ICD-10-CM | POA: Diagnosis not present

## 2021-03-10 DIAGNOSIS — I5033 Acute on chronic diastolic (congestive) heart failure: Secondary | ICD-10-CM | POA: Diagnosis not present

## 2021-03-10 DIAGNOSIS — R778 Other specified abnormalities of plasma proteins: Secondary | ICD-10-CM

## 2021-03-10 DIAGNOSIS — R197 Diarrhea, unspecified: Secondary | ICD-10-CM | POA: Diagnosis present

## 2021-03-10 DIAGNOSIS — Z794 Long term (current) use of insulin: Secondary | ICD-10-CM | POA: Diagnosis not present

## 2021-03-10 DIAGNOSIS — Z825 Family history of asthma and other chronic lower respiratory diseases: Secondary | ICD-10-CM | POA: Diagnosis not present

## 2021-03-10 DIAGNOSIS — E785 Hyperlipidemia, unspecified: Secondary | ICD-10-CM | POA: Diagnosis present

## 2021-03-10 DIAGNOSIS — E876 Hypokalemia: Secondary | ICD-10-CM | POA: Diagnosis present

## 2021-03-10 DIAGNOSIS — R7989 Other specified abnormal findings of blood chemistry: Secondary | ICD-10-CM | POA: Diagnosis present

## 2021-03-10 DIAGNOSIS — G4733 Obstructive sleep apnea (adult) (pediatric): Secondary | ICD-10-CM | POA: Diagnosis present

## 2021-03-10 DIAGNOSIS — E782 Mixed hyperlipidemia: Secondary | ICD-10-CM

## 2021-03-10 DIAGNOSIS — L97529 Non-pressure chronic ulcer of other part of left foot with unspecified severity: Secondary | ICD-10-CM | POA: Diagnosis present

## 2021-03-10 DIAGNOSIS — E1169 Type 2 diabetes mellitus with other specified complication: Secondary | ICD-10-CM

## 2021-03-10 DIAGNOSIS — I5032 Chronic diastolic (congestive) heart failure: Secondary | ICD-10-CM | POA: Diagnosis present

## 2021-03-10 DIAGNOSIS — I132 Hypertensive heart and chronic kidney disease with heart failure and with stage 5 chronic kidney disease, or end stage renal disease: Secondary | ICD-10-CM | POA: Diagnosis present

## 2021-03-10 DIAGNOSIS — L97521 Non-pressure chronic ulcer of other part of left foot limited to breakdown of skin: Secondary | ICD-10-CM

## 2021-03-10 DIAGNOSIS — I501 Left ventricular failure: Secondary | ICD-10-CM | POA: Diagnosis not present

## 2021-03-10 DIAGNOSIS — Z20822 Contact with and (suspected) exposure to covid-19: Secondary | ICD-10-CM | POA: Diagnosis present

## 2021-03-10 DIAGNOSIS — D631 Anemia in chronic kidney disease: Secondary | ICD-10-CM | POA: Diagnosis present

## 2021-03-10 DIAGNOSIS — I4510 Unspecified right bundle-branch block: Secondary | ICD-10-CM | POA: Diagnosis present

## 2021-03-10 DIAGNOSIS — Z8249 Family history of ischemic heart disease and other diseases of the circulatory system: Secondary | ICD-10-CM | POA: Diagnosis not present

## 2021-03-10 DIAGNOSIS — N185 Chronic kidney disease, stage 5: Secondary | ICD-10-CM | POA: Diagnosis not present

## 2021-03-10 DIAGNOSIS — Z833 Family history of diabetes mellitus: Secondary | ICD-10-CM | POA: Diagnosis not present

## 2021-03-10 DIAGNOSIS — I10 Essential (primary) hypertension: Secondary | ICD-10-CM

## 2021-03-10 DIAGNOSIS — E11621 Type 2 diabetes mellitus with foot ulcer: Secondary | ICD-10-CM | POA: Diagnosis present

## 2021-03-10 DIAGNOSIS — Z87891 Personal history of nicotine dependence: Secondary | ICD-10-CM | POA: Diagnosis not present

## 2021-03-10 DIAGNOSIS — I5031 Acute diastolic (congestive) heart failure: Secondary | ICD-10-CM

## 2021-03-10 DIAGNOSIS — L97522 Non-pressure chronic ulcer of other part of left foot with fat layer exposed: Secondary | ICD-10-CM | POA: Diagnosis present

## 2021-03-10 DIAGNOSIS — E861 Hypovolemia: Secondary | ICD-10-CM | POA: Diagnosis present

## 2021-03-10 DIAGNOSIS — I248 Other forms of acute ischemic heart disease: Secondary | ICD-10-CM | POA: Diagnosis present

## 2021-03-10 DIAGNOSIS — E1122 Type 2 diabetes mellitus with diabetic chronic kidney disease: Secondary | ICD-10-CM

## 2021-03-10 DIAGNOSIS — Z9989 Dependence on other enabling machines and devices: Secondary | ICD-10-CM

## 2021-03-10 DIAGNOSIS — Z8042 Family history of malignant neoplasm of prostate: Secondary | ICD-10-CM | POA: Diagnosis not present

## 2021-03-10 LAB — CBC WITH DIFFERENTIAL/PLATELET
Abs Immature Granulocytes: 0.07 10*3/uL (ref 0.00–0.07)
Abs Immature Granulocytes: 0.08 10*3/uL — ABNORMAL HIGH (ref 0.00–0.07)
Basophils Absolute: 0 10*3/uL (ref 0.0–0.1)
Basophils Absolute: 0.1 10*3/uL (ref 0.0–0.1)
Basophils Relative: 0 %
Basophils Relative: 1 %
Eosinophils Absolute: 0.1 10*3/uL (ref 0.0–0.5)
Eosinophils Absolute: 0.1 10*3/uL (ref 0.0–0.5)
Eosinophils Relative: 1 %
Eosinophils Relative: 1 %
HCT: 33.5 % — ABNORMAL LOW (ref 39.0–52.0)
HCT: 35.3 % — ABNORMAL LOW (ref 39.0–52.0)
Hemoglobin: 11 g/dL — ABNORMAL LOW (ref 13.0–17.0)
Hemoglobin: 11.1 g/dL — ABNORMAL LOW (ref 13.0–17.0)
Immature Granulocytes: 1 %
Immature Granulocytes: 1 %
Lymphocytes Relative: 7 %
Lymphocytes Relative: 8 %
Lymphs Abs: 0.8 10*3/uL (ref 0.7–4.0)
Lymphs Abs: 0.8 10*3/uL (ref 0.7–4.0)
MCH: 28.4 pg (ref 26.0–34.0)
MCH: 28.7 pg (ref 26.0–34.0)
MCHC: 31.4 g/dL (ref 30.0–36.0)
MCHC: 32.8 g/dL (ref 30.0–36.0)
MCV: 86.3 fL (ref 80.0–100.0)
MCV: 91.2 fL (ref 80.0–100.0)
Monocytes Absolute: 0.5 10*3/uL (ref 0.1–1.0)
Monocytes Absolute: 0.6 10*3/uL (ref 0.1–1.0)
Monocytes Relative: 5 %
Monocytes Relative: 6 %
Neutro Abs: 8.6 10*3/uL — ABNORMAL HIGH (ref 1.7–7.7)
Neutro Abs: 9.2 10*3/uL — ABNORMAL HIGH (ref 1.7–7.7)
Neutrophils Relative %: 84 %
Neutrophils Relative %: 85 %
Platelets: 158 10*3/uL (ref 150–400)
Platelets: 179 10*3/uL (ref 150–400)
RBC: 3.87 MIL/uL — ABNORMAL LOW (ref 4.22–5.81)
RBC: 3.88 MIL/uL — ABNORMAL LOW (ref 4.22–5.81)
RDW: 13.7 % (ref 11.5–15.5)
RDW: 13.9 % (ref 11.5–15.5)
WBC: 10.1 10*3/uL (ref 4.0–10.5)
WBC: 10.8 10*3/uL — ABNORMAL HIGH (ref 4.0–10.5)
nRBC: 0 % (ref 0.0–0.2)
nRBC: 0 % (ref 0.0–0.2)

## 2021-03-10 LAB — COMPREHENSIVE METABOLIC PANEL
ALT: 18 U/L (ref 0–44)
ALT: 24 U/L (ref 0–44)
AST: 22 U/L (ref 15–41)
AST: 22 U/L (ref 15–41)
Albumin: 3.4 g/dL — ABNORMAL LOW (ref 3.5–5.0)
Albumin: 3.5 g/dL (ref 3.5–5.0)
Alkaline Phosphatase: 59 U/L (ref 38–126)
Alkaline Phosphatase: 61 U/L (ref 38–126)
Anion gap: 15 (ref 5–15)
Anion gap: 15 (ref 5–15)
BUN: 79 mg/dL — ABNORMAL HIGH (ref 6–20)
BUN: 78 mg/dL — ABNORMAL HIGH (ref 6–20)
CO2: 19 mmol/L — ABNORMAL LOW (ref 22–32)
CO2: 21 mmol/L — ABNORMAL LOW (ref 22–32)
Calcium: 9.1 mg/dL (ref 8.9–10.3)
Calcium: 9.4 mg/dL (ref 8.9–10.3)
Chloride: 106 mmol/L (ref 98–111)
Chloride: 104 mmol/L (ref 98–111)
Creatinine, Ser: 4.72 mg/dL — ABNORMAL HIGH (ref 0.61–1.24)
Creatinine, Ser: 4.86 mg/dL — ABNORMAL HIGH (ref 0.61–1.24)
GFR, Estimated: 13 mL/min — ABNORMAL LOW (ref >60)
GFR, Estimated: 13 mL/min — ABNORMAL LOW (ref >60)
Glucose, Bld: 160 mg/dL — ABNORMAL HIGH (ref 70–99)
Glucose, Bld: 197 mg/dL — ABNORMAL HIGH (ref 70–99)
Potassium: 3.6 mmol/L (ref 3.5–5.1)
Potassium: 3 mmol/L — ABNORMAL LOW (ref 3.5–5.1)
Sodium: 140 mmol/L (ref 135–145)
Sodium: 140 mmol/L (ref 135–145)
Total Bilirubin: 2.3 mg/dL — ABNORMAL HIGH (ref 0.3–1.2)
Total Bilirubin: 2.3 mg/dL — ABNORMAL HIGH (ref 0.3–1.2)
Total Protein: 6.9 g/dL (ref 6.5–8.1)
Total Protein: 7.5 g/dL (ref 6.5–8.1)

## 2021-03-10 LAB — GLUCOSE, CAPILLARY
Glucose-Capillary: 198 mg/dL — ABNORMAL HIGH (ref 70–99)
Glucose-Capillary: 158 mg/dL — ABNORMAL HIGH (ref 70–99)

## 2021-03-10 LAB — URINALYSIS, COMPLETE (UACMP) WITH MICROSCOPIC
Bacteria, UA: NONE SEEN
Bilirubin Urine: NEGATIVE
Glucose, UA: NEGATIVE mg/dL
Ketones, ur: NEGATIVE mg/dL
Leukocytes,Ua: NEGATIVE
Nitrite: NEGATIVE
Protein, ur: 100 mg/dL — AB
Specific Gravity, Urine: 1.008 (ref 1.005–1.030)
pH: 5 (ref 5.0–8.0)

## 2021-03-10 LAB — MAGNESIUM: Magnesium: 2 mg/dL (ref 1.7–2.4)

## 2021-03-10 LAB — RESP PANEL BY RT-PCR (FLU A&B, COVID) ARPGX2
Influenza A by PCR: NEGATIVE
Influenza B by PCR: NEGATIVE
SARS Coronavirus 2 by RT PCR: NEGATIVE

## 2021-03-10 LAB — ECHOCARDIOGRAM COMPLETE
Area-P 1/2: 4.33 cm2
S' Lateral: 3.8 cm

## 2021-03-10 LAB — TROPONIN I (HIGH SENSITIVITY)
Troponin I (High Sensitivity): 52 ng/L — ABNORMAL HIGH (ref <18)
Troponin I (High Sensitivity): 55 ng/L — ABNORMAL HIGH (ref <18)

## 2021-03-10 LAB — PROTEIN / CREATININE RATIO, URINE
Creatinine, Urine: 27.64 mg/dL
Protein Creatinine Ratio: 4.38 mg/mg{Cre} — ABNORMAL HIGH (ref 0.00–0.15)
Total Protein, Urine: 121 mg/dL

## 2021-03-10 LAB — CBG MONITORING, ED
Glucose-Capillary: 202 mg/dL — ABNORMAL HIGH (ref 70–99)
Glucose-Capillary: 197 mg/dL — ABNORMAL HIGH (ref 70–99)

## 2021-03-10 LAB — HEMOGLOBIN A1C
Hgb A1c MFr Bld: 6.2 % — ABNORMAL HIGH (ref 4.8–5.6)
Mean Plasma Glucose: 131.24 mg/dL

## 2021-03-10 LAB — C DIFFICILE QUICK SCREEN W PCR REFLEX
C Diff antigen: NEGATIVE
C Diff interpretation: NOT DETECTED
C Diff toxin: NEGATIVE

## 2021-03-10 LAB — BRAIN NATRIURETIC PEPTIDE: B Natriuretic Peptide: 889.9 pg/mL — ABNORMAL HIGH (ref 0.0–100.0)

## 2021-03-10 LAB — HIV ANTIBODY (ROUTINE TESTING W REFLEX): HIV Screen 4th Generation wRfx: NONREACTIVE

## 2021-03-10 MED ORDER — HYDRALAZINE HCL 50 MG PO TABS
100.0000 mg | ORAL_TABLET | Freq: Three times a day (TID) | ORAL | Status: DC
  Administered 2021-03-10 – 2021-03-11 (×5): 100 mg via ORAL
  Filled 2021-03-10 (×5): qty 2

## 2021-03-10 MED ORDER — FUROSEMIDE 10 MG/ML IJ SOLN
80.0000 mg | Freq: Two times a day (BID) | INTRAMUSCULAR | Status: DC
  Administered 2021-03-10 – 2021-03-11 (×3): 80 mg via INTRAVENOUS
  Filled 2021-03-10 (×3): qty 8

## 2021-03-10 MED ORDER — ACETAMINOPHEN 650 MG RE SUPP: 650.0000 mg | Freq: Four times a day (QID) | RECTAL | Status: DC | PRN

## 2021-03-10 MED ORDER — METOLAZONE 5 MG PO TABS
5.0000 mg | ORAL_TABLET | Freq: Every day | ORAL | Status: DC
  Administered 2021-03-10 – 2021-03-11 (×2): 5 mg via ORAL
  Filled 2021-03-10 (×2): qty 1

## 2021-03-10 MED ORDER — AMLODIPINE BESYLATE 10 MG PO TABS
10.0000 mg | ORAL_TABLET | Freq: Every day | ORAL | Status: DC
  Administered 2021-03-10 – 2021-03-11 (×2): 10 mg via ORAL
  Filled 2021-03-10: qty 2
  Filled 2021-03-10: qty 1

## 2021-03-10 MED ORDER — INSULIN GLARGINE-YFGN 100 UNIT/ML ~~LOC~~ SOLN
28.0000 [IU] | Freq: Every day | SUBCUTANEOUS | Status: DC
  Administered 2021-03-10 – 2021-03-11 (×2): 28 [IU] via SUBCUTANEOUS
  Filled 2021-03-10 (×2): qty 0.28

## 2021-03-10 MED ORDER — ACETAMINOPHEN 325 MG PO TABS: 650.0000 mg | ORAL_TABLET | Freq: Four times a day (QID) | ORAL | Status: DC | PRN

## 2021-03-10 MED ORDER — POTASSIUM CHLORIDE CRYS ER 20 MEQ PO TBCR
20.0000 meq | EXTENDED_RELEASE_TABLET | Freq: Every day | ORAL | Status: DC
  Administered 2021-03-10 – 2021-03-11 (×2): 20 meq via ORAL
  Filled 2021-03-10 (×2): qty 1

## 2021-03-10 MED ORDER — FLUTICASONE PROPIONATE 50 MCG/ACT NA SUSP
2.0000 | Freq: Two times a day (BID) | NASAL | Status: DC
  Administered 2021-03-10 – 2021-03-11 (×3): 2 via NASAL
  Filled 2021-03-10: qty 16

## 2021-03-10 MED ORDER — PERFLUTREN LIPID MICROSPHERE
1.0000 mL | INTRAVENOUS | Status: AC | PRN
  Administered 2021-03-10: 3 mL via INTRAVENOUS
  Filled 2021-03-10: qty 10

## 2021-03-10 MED ORDER — ONDANSETRON HCL 4 MG PO TABS: 4.0000 mg | ORAL_TABLET | Freq: Four times a day (QID) | ORAL | Status: DC | PRN

## 2021-03-10 MED ORDER — METOPROLOL TARTRATE 100 MG PO TABS
100.0000 mg | ORAL_TABLET | Freq: Two times a day (BID) | ORAL | Status: DC
  Administered 2021-03-10 – 2021-03-11 (×3): 100 mg via ORAL
  Filled 2021-03-10: qty 1
  Filled 2021-03-10: qty 4
  Filled 2021-03-10: qty 1

## 2021-03-10 MED ORDER — FUROSEMIDE 10 MG/ML IJ SOLN
80.0000 mg | Freq: Once | INTRAMUSCULAR | Status: AC
Start: 2021-03-10 — End: 2021-03-10
  Administered 2021-03-10: 80 mg via INTRAVENOUS
  Filled 2021-03-10: qty 8

## 2021-03-10 MED ORDER — INSULIN ASPART 100 UNIT/ML IJ SOLN
10.0000 [IU] | Freq: Three times a day (TID) | INTRAMUSCULAR | Status: DC
  Administered 2021-03-10 – 2021-03-11 (×5): 10 [IU] via SUBCUTANEOUS

## 2021-03-10 MED ORDER — ONDANSETRON HCL 4 MG/2ML IJ SOLN: 4.0000 mg | Freq: Four times a day (QID) | INTRAMUSCULAR | Status: DC | PRN

## 2021-03-10 MED ORDER — ASPIRIN EC 81 MG PO TBEC
81.0000 mg | DELAYED_RELEASE_TABLET | Freq: Every day | ORAL | Status: DC
  Administered 2021-03-10 – 2021-03-11 (×2): 81 mg via ORAL
  Filled 2021-03-10 (×2): qty 1

## 2021-03-10 MED ORDER — ROSUVASTATIN CALCIUM 20 MG PO TABS
20.0000 mg | ORAL_TABLET | Freq: Every day | ORAL | Status: DC
  Administered 2021-03-10 – 2021-03-11 (×2): 20 mg via ORAL
  Filled 2021-03-10 (×2): qty 1

## 2021-03-10 MED ORDER — HEPARIN SODIUM (PORCINE) 5000 UNIT/ML IJ SOLN
5000.0000 [IU] | Freq: Three times a day (TID) | INTRAMUSCULAR | Status: DC
  Administered 2021-03-10 – 2021-03-11 (×3): 5000 [IU] via SUBCUTANEOUS
  Filled 2021-03-10 (×3): qty 1

## 2021-03-10 MED ORDER — POLYETHYLENE GLYCOL 3350 17 G PO PACK: 17.0000 g | PACK | Freq: Every day | ORAL | Status: DC | PRN

## 2021-03-10 MED ORDER — DOXAZOSIN MESYLATE 8 MG PO TABS
4.0000 mg | ORAL_TABLET | Freq: Every day | ORAL | Status: DC
  Administered 2021-03-10: 4 mg via ORAL
  Filled 2021-03-10 (×2): qty 1

## 2021-03-10 MED ORDER — INSULIN ASPART 100 UNIT/ML IJ SOLN
0.0000 [IU] | Freq: Three times a day (TID) | INTRAMUSCULAR | Status: DC
  Administered 2021-03-10 (×2): 3 [IU] via SUBCUTANEOUS
  Administered 2021-03-10: 5 [IU] via SUBCUTANEOUS
  Administered 2021-03-10: 3 [IU] via SUBCUTANEOUS
  Administered 2021-03-11: 5 [IU] via SUBCUTANEOUS
  Administered 2021-03-11: 2 [IU] via SUBCUTANEOUS

## 2021-03-10 NOTE — ED Notes (Signed)
Breakfast orders Placed °

## 2021-03-10 NOTE — Progress Notes (Addendum)
61 year old male with past medical history of large pericardial effusion 12/2018 status post pericardiocentesis and pericardial window, diastolic congestive heart failure (Echo 01/2019 EF 55-60% with G3DD), hypertension, hyperlipidemia, obstructive sleep apnea on CPAP, insulin-dependent diabetes mellitus type 2, chronic kidney disease stage V (follows with [name], baseline Cr  appears to be between 4.4-4.7) who presents to [facility] emergency department with complaints of shortness of breath.  Associated with bilateral lower extremity edema.  Also reports acute diarrhea since Friday.  Work-up revealed acute on chronic diastolic CHF.  Also revealed elevated troponin thought secondary to demand ischemia, likely type II, in the setting of hypovolemia from acute diarrhea.  Please refer to H&P dictated by my partner Dr. Marlyce Huge on 03/10/2021 for further details of the assessment and plan.

## 2021-03-10 NOTE — Assessment & Plan Note (Signed)
•   Resume patients home regimen of substantial oral antihypertensive regimen.    Titrate antihypertensive regimen as necessary to achieve adequate BP control  PRN intravenous antihypertensives for excessively elevated blood pressure

## 2021-03-10 NOTE — ED Notes (Signed)
Pt ambulated self to bathroom for bowel movement. Bedside commode offered to pt & pt declined, saying that he feels comfortable enough on room air to go without supplemental oxygen while in the bathroom.

## 2021-03-10 NOTE — Evaluation (Signed)
Physical Therapy Evaluation Patient Details Name: Samuel Reed MRN: [id number] DOB: [birth year] Today's Date: 03/10/2021  History of Present Illness  Patient is a 61 y/o Male presenting on 03/09/21 with SOB and difficulty walking consisitent with his diagnosis of acute on chronic diastolic heart failure. Imaging showed bilateral perihilar opacities consistent with edema or infection. PMH includes DM2, renal disease, HTN, and a PE.  Clinical Impression  Patient presents with the above problem with the impairments below. Patient was independent with bed mobility and transfers and was mod I with occasional supervision for safety during ambulation in hallway. Patient performed all DGI tasks with no LOB. Breathing mildly labored, however O2 sats WFL on RA. Patient educated on energy conservation techniques such as taking breaks with activity and use of a shower chair. PT educated patient on a walking program with energy conservation techniques. No further acute skilled PT needs. PT to sign off. If patient status changes, please reconsult.      Recommendations for follow up therapy are one component of a multi-disciplinary discharge planning process, led by the attending physician.  Recommendations may be updated based on patient status, additional functional criteria and insurance authorization.  Follow Up Recommendations No PT follow up    Assistance Recommended at Discharge PRN  Patient can return home with the following       Equipment Recommendations None recommended by PT  Recommendations for Other Services       Functional Status Assessment Patient has had a recent decline in their functional status and demonstrates the ability to make significant improvements in function in a reasonable and predictable amount of time.     Precautions / Restrictions Precautions Precautions: None Restrictions Weight Bearing Restrictions: No      Mobility  Bed Mobility Overal bed mobility:  Independent             General bed mobility comments: Patient went from supine to sitting with no difficulties and no supervision needed. Used bilateral UE support to scoot his bottom to the EOB prior to standing.    Transfers Overall transfer level: Independent Equipment used: None               General transfer comment: Patient able to perform a sit to stand transfer independently. No physical assist required.    Ambulation/Gait Ambulation/Gait assistance: Supervision Gait Distance (Feet): 125 Feet Assistive device: None Gait Pattern/deviations: Step-through pattern, Decreased step length - right, Decreased step length - left, Decreased stride length, Shuffle, Narrow base of support Gait velocity: decreased Gait velocity interpretation: 1.31 - 2.62 ft/sec, indicative of limited community ambulator Pre-gait activities: marching General Gait Details: Patient had slower gait and mildly labored breathing ~ 75 feet into ambulating in the hallway. He took increased time to take steps but was able to perform all DGI tasks without any LOB. SpO2 remained stable during ambulation with RA  Stairs            Wheelchair Mobility    Modified Rankin (Stroke Patients Only)       Balance Overall balance assessment: Independent                               Standardized Balance Assessment Standardized Balance Assessment : Dynamic Gait Index   Dynamic Gait Index Level Surface: Normal Gait with Horizontal Head Turns: Normal Gait with Vertical Head Turns: Normal Gait and Pivot Turn: Mild Impairment Step Over Obstacle: Mild Impairment Step Around  Obstacles: Mild Impairment       Pertinent Vitals/Pain Pain Assessment Pain Assessment: Faces Faces Pain Scale: No hurt    Home Living Family/patient expects to be discharged to:: Private residence Living Arrangements: Spouse/significant other Available Help at Discharge: Available PRN/intermittently Type of  Home: House Home Access: Stairs to enter Entrance Stairs-Rails: None Entrance Stairs-Number of Steps: 2   Home Layout: One level Home Equipment: None      Prior Function Prior Level of Function : Independent/Modified Independent             Mobility Comments: Able to perform all mobility independently ADLs Comments: able to perform all ADLs independently     Hand Dominance   Dominant Hand: Right    Extremity/Trunk Assessment   Upper Extremity Assessment Upper Extremity Assessment: Overall WFL for tasks assessed    Lower Extremity Assessment Lower Extremity Assessment: Overall WFL for tasks assessed    Cervical / Trunk Assessment Cervical / Trunk Assessment: Normal  Communication   Communication: No difficulties  Cognition Arousal/Alertness: Awake/alert Behavior During Therapy: WFL for tasks assessed/performed Overall Cognitive Status: Within Functional Limits for tasks assessed                                 General Comments: tearful during evaluation secondary to current deficits.        General Comments General comments (skin integrity, edema, etc.): Patient educated on energy conservation techniques such as taking breaks with activity, and use of a shower chair. PT educated patient on a walking program with energy conservation techniques when he his D/C home.    Exercises     Assessment/Plan    PT Assessment Patient does not need any further PT services  PT Problem List         PT Treatment Interventions      PT Goals (Current goals can be found in the Care Plan section)  Acute Rehab PT Goals Patient Stated Goal: to go home PT Goal Formulation: With patient Time For Goal Achievement: 03/10/21 Potential to Achieve Goals: Good    Frequency       Co-evaluation               AM-PAC PT "6 Clicks" Mobility  Outcome Measure Help needed turning from your back to your side while in a flat bed without using bedrails?:  None Help needed moving from lying on your back to sitting on the side of a flat bed without using bedrails?: None Help needed moving to and from a bed to a chair (including a wheelchair)?: None Help needed standing up from a chair using your arms (e.g., wheelchair or bedside chair)?: None Help needed to walk in hospital room?: None Help needed climbing 3-5 steps with a railing? : A Little 6 Click Score: 23    End of Session   Activity Tolerance: Patient tolerated treatment well Patient left: in bed;with call bell/phone within reach;with family/visitor present (on stretcher in ED) Nurse Communication: Mobility status PT Visit Diagnosis: Difficulty in walking, not elsewhere classified (R26.2)    Time: 1012-1027 PT Time Calculation (min) (ACUTE ONLY): 15 min   Charges:   PT Evaluation $PT Eval Low Complexity: 1 Low          Jonne Ply, SPT  [place] 03/10/2021, 12:12 PM

## 2021-03-10 NOTE — Assessment & Plan Note (Signed)
.   Continuing home regimen of lipid lowering therapy.  

## 2021-03-10 NOTE — Consult Note (Signed)
[place] Nurse Consult Note: Patient receiving care in [place] Reason for Consult: Left foot wound Wound type: callus at the base of the little toe. Much improvement over the photo from 02/26/21 taken by Dr. Amalia Hailey with Podiatry who performed an excision debridement. The wound is not open and no signs of infection. He states he has a follow up appointment with Dr. Amalia Hailey next week. Until then the dressing was removed and a small square foam dressing was placed over the area for protection.  Pressure Injury POA: NA Dressing procedure/placement/frequency: Clean the left foot with soap and water, rinse and pat dry. Place a small foam dressing over the area at the base of the little toe. Assess daily and change foam dressing every 3 days.  Monitor the wound area(s) for worsening of condition such as: Signs/symptoms of infection, increase in size, development of or worsening of odor, development of pain, or increased pain at the affected locations.   Notify the medical team if any of these develop.  Thank you for the consult. [place] nurse will not follow at this time.   Please re-consult the [place] team if needed.  Cathlean Marseilles Tamala Julian, MSN, RN, [place], [place] Pearl, [facility] Wound Treatment Associate Pager [phone]

## 2021-03-10 NOTE — Progress Notes (Signed)
OT Cancellation Note  Patient Details Name: Samuel Reed MRN: [id number] DOB: July 23, 1960   Cancelled Treatment:    Reason Eval/Treat Not Completed: OT screened, no needs identified, will sign off- spoke to PT, reports patient independent with ADLs and mobility.  No OT needs identified and OT will sign off.   Jolaine Artist, OT Acute Rehabilitation Services Pager [phone] Office [phone]   Delight Stare 03/10/2021, 10:33 AM

## 2021-03-10 NOTE — Progress Notes (Signed)
Heart Failure Navigator Progress Note  Assessed for Heart & Vascular TOC clinic readiness.  Patient does not meet criteria due to CKD V, SCr baseline 4.4-4.7, currently 4.86. Follows with nephrology, [name] .   Navigator available for reassessment of patient.   Pricilla Holm, MSN, RN Heart Failure Nurse Navigator [phone]

## 2021-03-10 NOTE — Assessment & Plan Note (Signed)
·   Evidence of pulmonary edema on chest imaging  Thought to be primarily secondary to acute diastolic congestive heart failure  Providing patient on supplemental oxygen for bouts of hypoxia  Remainder of assessment and plan as above.

## 2021-03-10 NOTE — H&P (Addendum)
History and Physical    [name] MRN: [id number] DOA: 03/09/2021  Date of Service: the patient was seen and examined on 03/10/2021  Patient coming from: Home  Chief Complaint:  Chief Complaint  Patient presents with   Shortness of Breath    HPI:   61 year old male with past medical history of large pericardial effusion 12/2018 status post pericardiocentesis and pericardial window, diastolic congestive heart failure (Echo 01/2019 EF 55-60% with G3DD), hypertension, hyperlipidemia, obstructive sleep apnea on CPAP, insulin-dependent diabetes mellitus type 2, chronic kidney disease stage V (follows with [name], baseline Cr  appears to be between 4.4-4.7) who presents to [facility] emergency department with complaints of shortness of breath.  Patient explains that for approximately past 3 days he has been developing increasing shortness of breath.  Shortness of breath is initially mild in intensity but progressively has become more more severe.  Shortness of breath is worse with exertion and improved with rest.  Patient denies chest pain.  Patient denies sick contacts, fevers, recent travel or contacts with confirmed COVID-19 infection.  Patient's symptoms continued to worsen with worsening shortness of breath and dyspnea on exertion to the patient eventually presented to [facility] emergency department for evaluation.  Upon evaluation in the emergency department ER provider felt patient was clinically stable from acute congestive heart failure.  Patient was found to have a BNP of 889 with chest x-ray consistent with acute pulmonary edema.  Patient was administered 80 mg of intravenous Lasix and the hospitalist group was then called to assess the patient for admission to the hospital.  Review of Systems: Review of Systems  Respiratory:  Positive for shortness of breath.   Neurological:  Positive for weakness.  All other systems reviewed and are  negative.   Past Medical History:  Diagnosis Date   Allergy    Anemia    Chronic venous insufficiency    CKD (chronic kidney disease), stage III ([place])    Diabetes mellitus type II 2003   HLD (hyperlipidemia)    HTN (hypertension) 1980's   Neuromuscular disorder (HCC)    neuropathy feet    OSA (obstructive sleep apnea)    Pericardial effusion    S/P pericardial window 01/16/2019   Sleep apnea    wear cpap     Past Surgical History:  Procedure Laterality Date   "Bone removal" from back  [place] N/A 02/12/2015   Procedure: Left Heart Cath and Coronary Angiography;  Surgeon: Adrian Prows, MD;  Location: [place] [facility];  Service: Cardiovascular;  Laterality: N/A;   COLONOSCOPY     KNEE CARTILAGE SURGERY     left knee   LUMBAR [place]   PERICARDIOCENTESIS N/A 01/16/2019   Procedure: PERICARDIOCENTESIS;  Surgeon: Troy Sine, MD;  Location: [place] [facility];  Service: Cardiovascular;  Laterality: N/A;   POLYPECTOMY     VIDEO ASSISTED THORACOSCOPY Right 01/17/2019   Procedure: VIDEO ASSISTED THORACOSCOPY, pericardial window for pericardium and pericardial fluid.;  Surgeon: Lajuana Matte, MD;  Location: [place] OR;  Service: Thoracic;  Laterality: Right;    Social History:  reports that he has quit smoking. His smoking use included pipe. He has never used smokeless tobacco. He reports that he does not drink alcohol and does not use drugs.  No Known Allergies  Family History  Problem Relation Age of Onset   COPD Father    Cancer Father  prostate   Prostate cancer Father    Diabetes Mother    Coronary artery disease Paternal Grandfather    Colon cancer Neg Hx    Colon polyps Neg Hx    Esophageal cancer Neg Hx    Rectal cancer Neg Hx    Stomach cancer Neg Hx     Prior to Admission medications   Medication Sig Start Date End Date Taking? Authorizing Provider  amLODipine (NORVASC) 10 MG tablet  TAKE 1 TABLET BY MOUTH EVERY DAY Patient taking differently: Take 10 mg by mouth daily. 07/15/20  Yes Viviana Simpler I, MD  aspirin EC 81 MG EC tablet Take 1 tablet (81 mg total) by mouth daily. 01/19/19  Yes Sande Rives E, PA-C  Cholecalciferol (VITAMIN D3) 50 MCG (2000 UT) TABS Take 2,000 Units by mouth daily with breakfast.   Yes [provider]  doxazosin (CARDURA) 4 MG tablet TAKE 1 TABLET BY MOUTH AT BEDTIME. Patient taking differently: Take 4 mg by mouth at bedtime. 09/11/20  Yes [name], MD  Ferrous Gluconate 324 (37.5 Fe) MG TABS Take 2 tablets by mouth daily.    Yes [provider]  fluticasone (FLONASE) 50 MCG/ACT nasal spray PLACE 1 SPRAY INTO BOTH NOSTRILS 2 (TWO) TIMES DAILY. Patient taking differently: Place 2 sprays into both nostrils 2 (two) times daily. 09/26/18  Yes [name], MD  gentamicin cream (GARAMYCIN) 0.1 % Apply 1 application topically 2 (two) times daily. 02/26/21  Yes Edrick Kins, DPM  hydrALAZINE (APRESOLINE) 100 MG tablet Take 100 mg by mouth 3 (three) times daily.   Yes [provider]  insulin glargine (LANTUS SOLOSTAR) 100 UNIT/ML Solostar Pen Inject 55 Units into the skin daily. 08/30/20  Yes Philemon Kingdom, MD  insulin lispro (HUMALOG KWIKPEN) 100 UNIT/ML KwikPen INJECT 10-20 UNITS TOTAL INTO THE SKIN 3 (THREE) TIMES DAILY BEFORE MEALS. Patient taking differently: Inject 20 Units into the skin 3 (three) times daily. INJECT 10-20 UNITS TOTAL INTO THE SKIN 3 (THREE) TIMES DAILY BEFORE MEALS. 01/07/21  Yes Philemon Kingdom, MD  KLOR-CON M20 20 MEQ tablet Take 20 mEq by mouth daily. 08/20/19  Yes [provider]  loratadine (CLARITIN) 5 MG/5ML syrup Take 5 mg by mouth daily as needed for allergies or rhinitis.   Yes [provider]  metolazone (ZAROXOLYN) 5 MG tablet Take 5 mg by mouth daily as needed (for extreme swelling).   Yes [provider]  rosuvastatin (CRESTOR) 20 MG tablet TAKE 1  TABLET BY MOUTH EVERY DAY Patient taking differently: Take 20 mg by mouth daily. 07/15/20  Yes [name], MD  Semaglutide, 1 MG/DOSE, (OZEMPIC, 1 MG/DOSE,) 4 MG/3ML SOPN Inject 1 mg into the skin once a week. 08/02/20  Yes Philemon Kingdom, MD  torsemide (DEMADEX) 20 MG tablet Take 40 mg by mouth daily.  10/05/17  Yes [provider]  BD PEN NEEDLE NANO 2ND GEN 32G X 4 MM MISC USE TO INJECT INSULIN 4 TIMES A DAY 01/24/20   Philemon Kingdom, MD  Continuous Blood Gluc Receiver (FREESTYLE LIBRE 2 READER) DEVI 1 each by Does not apply route daily. 03/14/19   Philemon Kingdom, MD  Continuous Blood Gluc Sensor (FREESTYLE LIBRE 2 SENSOR) MISC 1 each by Does not apply route every 14 (fourteen) days. 01/06/21   Philemon Kingdom, MD  doxycycline (VIBRAMYCIN) 100 MG capsule Take 1 capsule (100 mg total) by mouth 2 (two) times daily. One po bid x 7 days Patient not taking: Reported on 03/09/2021  02/24/21   Palumbo, April, MD  metoprolol tartrate (LOPRESSOR) 100 MG tablet TAKE 1 TABLET BY MOUTH TWICE A DAY Patient taking differently: Take 100 mg by mouth 2 (two) times daily. 09/25/20   [name], MD  PRESCRIPTION MEDICATION CPAP: At bedtime    [provider]    Physical Exam:  Vitals:   03/10/21 0200 03/10/21 0245 03/10/21 0430 03/10/21 0530  BP: (!) 155/73 (!) 165/68 (!) 165/68 (!) 148/52  Pulse: 73 75 79 81  Resp: 19 (!) 21 (!) 23 20  Temp:      TempSrc:      SpO2: 97% 96% 96% 95%    Constitutional: Awake alert and oriented x3, patient is in mild respiratory distress.   Skin: Healing ulceration on plantar surface of left foot, present on admission.   good skin turgor noted. Eyes: Pupils are equally reactive to light.  No evidence of scleral icterus or conjunctival pallor.  ENMT: Slightly dry mucous membranes noted.  Posterior pharynx clear of any exudate or lesions.   Neck: normal, supple, no masses, no thyromegaly.  No evidence of jugular venous distension.    Respiratory: Bibasilar rales noted without any evidence of wheezing.  Normal respiratory effort without any evidence of accessory muscle use.   Cardiovascular: Regular rate and rhythm, no murmurs / rubs / gallops.  Substantial bilateral lower extremity pitting edema that tracks from the feet all the way up through the mid thigh bilaterally.  2+ pedal pulses. No carotid bruits.  Chest:   Nontender without crepitus or deformity.   Back:   Nontender without crepitus or deformity. Abdomen: Abdomen is protuberant but soft and nontender.  No evidence of intra-abdominal masses.  Positive bowel sounds noted in all quadrants.   Musculoskeletal: No joint deformity upper and lower extremities. Good ROM, no contractures. Normal muscle tone.  Neurologic: CN 2-12 grossly intact. Sensation intact.  Patient moving all 4 extremities spontaneously.  Patient is following all commands.  Patient is responsive to verbal stimuli.   Psychiatric: Patient exhibits normal mood with appropriate affect.  Patient seems to possess insight as to their current situation.    Data Reviewed:  I have personally reviewed and interpreted labs, imaging.  Significant findings are:  Slightly elevated troponins noted at 52 and 55.   Mild leukocytosis of 10.8.   BNP found to be slightly elevated at 889.9, up from 273.32 years ago.   BUN 79, creatinine 4.72, up from a creatinine of 4.93 10/2020.   Chest x-ray personally reviewed revealing patchy bilateral lower lobe infiltrates concerning for early pulmonary edema.  EKG: Personally reviewed.  Rhythm is normal sinus rhythm with heart rate of 73 bpm.  Evidence of right bundle branch block with first-degree AV block.  No dynamic ST segment changes appreciated.   Assessment and Plan: * Acute on chronic diastolic (congestive) heart failure ([place])- (present on admission) Patient presenting with several month history of increasing peripheral edema and progressive cough with several day  history of increasing shortness of breath Patient reports that his home metolazone regimen was switched from being taken daily to as needed several months ago.  Patient reports compliance with his torsemide however. Exam reveals bibasilar rales and extensive peripheral edema suggestive of acute diastolic congestive heart failure however volume overload may be multifactorial in the setting of advanced renal disease and hypoalbuminemia Treating patient with Lasix 80 mg IV twice daily with 5 mg of oral metolazone daily Strict input and output monitoring Supplemental oxygen for bouts of  hypoxia Monitoring renal function and electrolytes with serial chemistires Daily weights   Acute cardiogenic pulmonary edema (HCC)- (present on admission) Evidence of pulmonary edema on chest imaging Thought to be primarily secondary to acute diastolic congestive heart failure Providing patient on supplemental oxygen for bouts of hypoxia Remainder of assessment and plan as above.  Chronic kidney disease, stage V ([place])- (present on admission) Advanced chronic kidney disease complicating presentation Patient follows with [name] in the outpatient setting.  Patient states that recently there has been consideration for initiation of peritoneal dialysis in the near future.  Patient is also being seen by transplant at [facility].   With initiation of diuretics I am concerned that patient's renal function may deteriorate. Patient may be suffering from a degree of third spacing secondary to advanced renal disease.  Obtaining urinalysis and spot urine creatinine ratio. If renal function worsens with diuresis low threshold to consult nephrology (follows with [name] in the outpatient setting) for consideration of initiation of dialysis while here  Acute diarrhea- (present on admission) Patient reporting 5-day history of diarrhea occurring 3-4 times daily completing a lengthy course of oral doxycycline prescribed by  podiatry for left foot ulceration Diarrhea still ongoing nearly 5 days after discontinuation of therapy making drug-related side effect less likely and C. difficile more likely. Patient has already had 3 episodes of diarrhea here in the emergency department. We will order stool testing for C. difficile, enteric precautions for now  Type 2 diabetes mellitus with stage 5 chronic kidney disease not on chronic dialysis, with long-term current use of insulin (HCC) Patient been placed on Accu-Cheks before every meal and nightly with sliding scale insulin Resume home regimen of insulin therapy at approximately 50% dose reduction considering tenuous oral intake and renal function Hemoglobin A1C ordered Renal diabetic Diet   Elevated troponin level not due myocardial infarction- (present on admission) Slightly elevated serial troponins with flat trajectory of elevation Patient is chest pain-free Likely secondary to underlying illness, plaque rupture is unlikely Monitoring patient on telemetry   Essential hypertension- (present on admission) Resume patients home regimen of substantial oral antihypertensive regimen.   Titrate antihypertensive regimen as necessary to achieve adequate BP control PRN intravenous antihypertensives for excessively elevated blood pressure    Mixed diabetic hyperlipidemia associated with type 2 diabetes mellitus ([place])- (present on admission) Continuing home regimen of lipid lowering therapy.   Foot ulceration, left, with fat layer exposed ([place])- (present on admission) Healing ulceration of plantar surface of left foot, present on admission, having been managed as an outpatient by podiatry. Course of doxycycline previously ordered completed last Wednesday will consult wound care Continue outpatient podiatry follow-up  OSA on CPAP Continuing home regimen of CPAP nightly        Code Status:  Full code  code status decision has been confirmed with:  patient Family Communication: deferred   Consults: Will consider Nephrology consult on day shift based on seral renal function testing.  Severity of Illness:  The appropriate patient status for this patient is INPATIENT. Inpatient status is judged to be reasonable and necessary in order to provide the required intensity of service to ensure the patient's safety. The patient's presenting symptoms, physical exam findings, and initial radiographic and laboratory data in the context of their chronic comorbidities is felt to place them at high risk for further clinical deterioration. Furthermore, it is not anticipated that the patient will be medically stable for discharge from the hospital within 2 midnights of admission.   *  I certify that at the point of admission it is my clinical judgment that the patient will require inpatient hospital care spanning beyond 2 midnights from the point of admission due to high intensity of service, high risk for further deterioration and high frequency of surveillance required.*  Author:  Vernelle Emerald MD  03/10/2021 6:41 AM

## 2021-03-10 NOTE — Assessment & Plan Note (Signed)
.   Continuing home regimen of CPAP nightly  

## 2021-03-10 NOTE — Assessment & Plan Note (Addendum)
·   Patient reporting 5-day history of diarrhea occurring 3-4 times daily completing a lengthy course of oral doxycycline prescribed by podiatry for left foot ulceration  Diarrhea still ongoing nearly 5 days after discontinuation of therapy making drug-related side effect less likely and C. difficile more likely.  Patient has already had 3 episodes of diarrhea here in the emergency department.  We will order stool testing for C. difficile, enteric precautions for now

## 2021-03-10 NOTE — Assessment & Plan Note (Addendum)
·   Advanced chronic kidney disease complicating presentation  Patient follows with [name] in the outpatient setting.  Patient states that recently there has been consideration for initiation of peritoneal dialysis in the near future.  Patient is also being seen by transplant at [facility].    With initiation of diuretics I am concerned that patient's renal function may deteriorate.  Patient may be suffering from a degree of third spacing secondary to advanced renal disease.  Obtaining urinalysis and spot urine creatinine ratio.  If renal function worsens with diuresis low threshold to consult nephrology (follows with [name] in the outpatient setting) for consideration of initiation of dialysis while here

## 2021-03-10 NOTE — ED Notes (Signed)
Ice water given to pt °

## 2021-03-10 NOTE — Assessment & Plan Note (Signed)
·   Patient presenting with several month history of increasing peripheral edema and progressive cough with several day history of increasing shortness of breath  Patient reports that his home metolazone regimen was switched from being taken daily to as needed several months ago.  Patient reports compliance with his torsemide however.  Exam reveals bibasilar rales and extensive peripheral edema suggestive of acute diastolic congestive heart failure however volume overload may be multifactorial in the setting of advanced renal disease and hypoalbuminemia  Treating patient with Lasix 80 mg IV twice daily with 5 mg of oral metolazone daily Strict input and output monitoring Supplemental oxygen for bouts of hypoxia Monitoring renal function and electrolytes with serial chemistires Daily weights

## 2021-03-10 NOTE — Assessment & Plan Note (Addendum)
·   Healing ulceration of plantar surface of left foot, present on admission, having been managed as an outpatient by podiatry.  Course of doxycycline previously ordered completed last Wednesday  will consult wound care  Continue outpatient podiatry follow-up

## 2021-03-10 NOTE — Assessment & Plan Note (Addendum)
•   Patient been placed on Accu-Cheks before every meal and nightly with sliding scale insulin  Resume home regimen of insulin therapy at approximately 50% dose reduction considering tenuous oral intake and renal function  Hemoglobin A1C ordered  Renal diabetic Diet

## 2021-03-10 NOTE — ED Notes (Signed)
Patient is gone to [facility]

## 2021-03-10 NOTE — H&P (Signed)
Pt has home cpap at home settings.  RT checked if pt needed ASSISTANCE WHICH HE DECLINED.  Rt WILL CONT TO MONITOR.

## 2021-03-10 NOTE — Assessment & Plan Note (Signed)
·   Slightly elevated serial troponins with flat trajectory of elevation °· Patient is chest pain-free °· Likely secondary to underlying illness, plaque rupture is unlikely °· Monitoring patient on telemetry ° ° ° ° ° ° °

## 2021-03-10 NOTE — Progress Notes (Signed)
Echocardiogram 2D Echocardiogram has been performed.  [place] Deputy Artia Singley RDCS 03/10/2021, 9:51 AM

## 2021-03-11 ENCOUNTER — Other Ambulatory Visit: Payer: Self-pay | Admitting: Internal Medicine

## 2021-03-11 DIAGNOSIS — I5033 Acute on chronic diastolic (congestive) heart failure: Secondary | ICD-10-CM

## 2021-03-11 LAB — RENAL FUNCTION PANEL
Albumin: 3.1 g/dL — ABNORMAL LOW (ref 3.5–5.0)
Anion gap: 16 — ABNORMAL HIGH (ref 5–15)
BUN: 92 mg/dL — ABNORMAL HIGH (ref 6–20)
CO2: 23 mmol/L (ref 22–32)
Calcium: 9.5 mg/dL (ref 8.9–10.3)
Chloride: 102 mmol/L (ref 98–111)
Creatinine, Ser: 5.07 mg/dL — ABNORMAL HIGH (ref 0.61–1.24)
GFR, Estimated: 12 mL/min — ABNORMAL LOW (ref >60)
Glucose, Bld: 141 mg/dL — ABNORMAL HIGH (ref 70–99)
Phosphorus: 4.1 mg/dL (ref 2.5–4.6)
Potassium: 2.7 mmol/L — CL (ref 3.5–5.1)
Sodium: 141 mmol/L (ref 135–145)

## 2021-03-11 LAB — MAGNESIUM: Magnesium: 2.1 mg/dL (ref 1.7–2.4)

## 2021-03-11 LAB — GLUCOSE, CAPILLARY
Glucose-Capillary: 139 mg/dL — ABNORMAL HIGH (ref 70–99)
Glucose-Capillary: 239 mg/dL — ABNORMAL HIGH (ref 70–99)

## 2021-03-11 LAB — POTASSIUM: Potassium: 3.2 mmol/L — ABNORMAL LOW (ref 3.5–5.1)

## 2021-03-11 MED ORDER — TORSEMIDE 20 MG PO TABS: 40.0000 mg | ORAL_TABLET | Freq: Two times a day (BID) | ORAL | Status: DC

## 2021-03-11 MED ORDER — LANTUS SOLOSTAR 100 UNIT/ML ~~LOC~~ SOPN: 28.0000 [IU] | PEN_INJECTOR | Freq: Every day | SUBCUTANEOUS | 0 refills | Status: DC

## 2021-03-11 MED ORDER — POTASSIUM CHLORIDE CRYS ER 20 MEQ PO TBCR: 40.0000 meq | EXTENDED_RELEASE_TABLET | Freq: Every day | ORAL | Status: DC

## 2021-03-11 MED ORDER — POTASSIUM CHLORIDE CRYS ER 20 MEQ PO TBCR
40.0000 meq | EXTENDED_RELEASE_TABLET | ORAL | Status: AC
  Administered 2021-03-11: 40 meq via ORAL
  Filled 2021-03-11: qty 2

## 2021-03-11 MED ORDER — POTASSIUM CHLORIDE CRYS ER 20 MEQ PO TBCR: 40.0000 meq | EXTENDED_RELEASE_TABLET | Freq: Every day | ORAL | 0 refills | Status: DC

## 2021-03-11 MED ORDER — TORSEMIDE 40 MG PO TABS: 40.0000 mg | ORAL_TABLET | Freq: Two times a day (BID) | ORAL | 0 refills | Status: DC

## 2021-03-11 NOTE — Discharge Summary (Addendum)
Discharge Summary  RAHM Samuel Reed:[id number] DOB: 1961/01/11  PCP: Samuel Carbon, MD  Admit date: 03/09/2021 Discharge date: 03/15/2021  Time spent: 35 minutes.  Recommendations for Outpatient Follow-up:  Follow-up with your cardiologist Dr. Gwenlyn Reed in 1 to 2 weeks. Follow-up with your nephrologist Dr. Joelyn Reed within a week. Follow-up with your primary care provider within a week. Take your medications as prescribed. Repeat BMP at the clinic in 1 week.  Discharge Diagnoses:  Active Hospital Problems   Diagnosis Date Noted   Acute on chronic diastolic (congestive) heart failure (HCC) 03/10/2021   Acute cardiogenic pulmonary edema (HCC) 03/10/2021   Mixed diabetic hyperlipidemia associated with type 2 diabetes mellitus ([place]) 03/10/2021   Acute diarrhea 03/10/2021   Foot ulceration, left, with fat layer exposed ([place]) 03/10/2021   Elevated troponin level not due myocardial infarction 03/10/2021   Chronic kidney disease, stage V ([place]) 07/01/2015   Type 2 diabetes mellitus with stage 5 chronic kidney disease not on chronic dialysis, with long-term current use of insulin ([place]) 06/28/2015   OSA on CPAP    Essential hypertension 02/10/2007    Resolved Hospital Problems  No resolved problems to display.    Discharge Condition: Stable  Diet recommendation: Heart healthy carb modified diet.  Vitals:   03/11/21 0930 03/11/21 1109  BP:  132/73  Pulse: 68 63  Resp:  17  Temp:  98.5 F (36.9 C)  SpO2:  98%    History of present illness:   61 year old male with past medical history of large pericardial effusion 12/2018 status post pericardiocentesis and pericardial window, diastolic congestive heart failure (Echo 01/2019 EF 55-60% with G3DD), hypertension, hyperlipidemia, obstructive sleep apnea on CPAP, insulin-dependent diabetes mellitus type 2, chronic kidney disease stage V (follows with Dr. Joelyn Reed, baseline Cr  appears to be between 4.4-4.7) who presents to [facility] emergency department with complaints of shortness of breath.  Associated with bilateral lower extremity edema.  Also reports acute diarrhea since Friday.  Work-up revealed acute on chronic diastolic CHF.  Also revealed elevated troponin thought secondary to demand ischemia, likely type II, in the setting of hypovolemia from acute diarrhea.  Seen by cardiology and nephrology.  Patient was diuresed and is currently at his dry weight.  His symptoms have resolved.  Plan is to follow-up with his cardiologist and nephrologist outpatient.  Okay to discharge to home from a cardiology and nephrology standpoint.  03/11/2021: Patient was seen at his bedside.  There were no acute events overnight.  States he slept very well.  Breathing is much improved and he is back to room air with O2 saturation of 98%.  Hospital Course:  Principal Problem:   Acute on chronic diastolic (congestive) heart failure (HCC) Active Problems:   Essential hypertension   OSA on CPAP   Type 2 diabetes mellitus with stage 5 chronic kidney disease not on chronic dialysis, with long-term current use of insulin (HCC)   Chronic kidney disease, stage V (HCC)   Acute cardiogenic pulmonary edema (HCC)   Mixed diabetic hyperlipidemia associated with type 2 diabetes mellitus (HCC)   Acute diarrhea   Foot ulceration, left, with fat layer exposed (HCC)   Elevated troponin level not due myocardial infarction  Acute HFpEF exacerbation- (present on admission) Likely multifactorial from not taking metolazone in combination with worsening of CKDV.  The echocardiogram showed normal EF with worsening asymmetric septal LVH.  The left atrium also severely dilated 2/2 diastolic dysfunction.  He was diuresed well with IV Lasix  and was started on increased dose of torsemide due to worsening CKD.  He is currently breathing well on room air and appears euvolemic on exam. -His dry weight is around 260 lbs -Continue torsemide and metolazone PRN after  discharge.   Patient understands when to take metolazone as needed for LE edema or increase in Wt.  -Follow-up with Dr. Gwenlyn Reed outpatient    Resolved acute cardiogenic pulmonary edema ([name])- (present on admission) Weaned off oxygen supplementation O2 saturation 98% on room air.   Chronic kidney disease, stage V ([name])- (present on admission) Advanced chronic kidney disease complicating presentation Seen by nephrology. No need for dialysis at this time however patient is interested to do PD when needed.  Dr. Carolin Reed has discussed with Dr. Joelyn Reed.  He will see him in the clinic within a week and make referral to home therapy clinic for PD. Follow-up with your nephrologist outpatient within a week.  Refractory post replacement: Hypokalemia Potassium improved from 2.7 to 3.2. KCl 40 mEq x 1 ordered prior to discharge. Nephrology recommended to increase p.o. potassium chloride to 40 meq to take every day for the management of hypokalemia.   Torsemide increased to 40 mg twice daily, metolazone as needed for lower extremity edema, as recommended by nephrology.   Resolved acute diarrhea- (present on admission) C. difficile PCR negative on 03/10/2021.   Type 2 diabetes mellitus with stage 5 chronic kidney disease not on chronic dialysis, with long-term current use of insulin (HCC) Hemoglobin A1c 6.2 on 03/10/2021. Reduced dose of Lantus 28 units from 55 units daily to avoid hypoglycemia. Hold off home Ozempic. Avoid hypoglycemia. Follow-up with your PCP within a week.   Elevated troponin level not due myocardial infarction- (present on admission), 2/2 demand ischemia Slightly elevated serial troponins with flat trajectory of elevation Patient is chest pain-free Patient was monitored on telemetry. Repeat echo did not show any LV regional wall abnormality.  Left heart cath in 2017 only showed 30% stenosis of proximal to mid LAD.   -Low suspicion for ACS or NSTEMI.  Suspect demand ischemia  secondary to volume overload. Follow-up with your cardiologist.   Essential hypertension- (present on admission) BP stable 132/73.  Rest of vital signs stable: Heart rate 63.  Respiration rate 22.  All saturation 98% on room air. Continue home regimen.   Mixed diabetic hyperlipidemia associated with type 2 diabetes mellitus ([place])- (present on admission) Continuing home regimen of lipid lowering therapy.   Foot ulceration, left, with fat layer exposed ([place])- (present on admission) Healing ulceration of plantar surface of left foot, present on admission, having been managed as an outpatient by podiatry. Course of doxycycline previously ordered completed last Wednesday Seen by wound care specialist. Continue outpatient podiatry follow-up   OSA on CPAP Continuing home regimen of CPAP nightly               Code Status:  Full code  code status decision has been confirmed with: patient Family Communication: deferred    Consults: Will consider Nephrology consult on day shift based on seral renal function testing.      Procedures: 2D echo.  Consultations: Cardiology Nephrology  Discharge Exam: BP 132/73 (BP Location: Right Arm)    Pulse 63    Temp 98.5 F (36.9 C) (Oral)    Resp 17    Ht 6' (1.829 m)    Wt 116.6 kg    SpO2 98%    BMI 34.86 kg/m  General: 61 y.o. year-old male well developed well  nourished in no acute distress.  Alert and oriented x3. Cardiovascular: Regular rate and rhythm with no rubs or gallops.  No thyromegaly or JVD noted.   Respiratory: Clear to auscultation with no wheezes or rales. Good inspiratory effort. Abdomen: Soft nontender nondistended with normal bowel sounds x4 quadrants. Musculoskeletal: Trace lower extremity edema bilaterally.   Psychiatry: Mood is appropriate for condition and setting  Discharge Instructions You were cared for by a hospitalist during your hospital stay. If you have any questions about your discharge medications or the  care you received while you were in the hospital after you are discharged, you can call the unit and asked to speak with the hospitalist on call if the hospitalist that took care of you is not available. Once you are discharged, your primary care physician will handle any further medical issues. Please note that NO REFILLS for any discharge medications will be authorized once you are discharged, as it is imperative that you return to your primary care physician (or establish a relationship with a primary care physician if you do not have one) for your aftercare needs so that they can reassess your need for medications and monitor your lab values.   Allergies as of 03/11/2021   No Known Allergies      Medication List     STOP taking these medications    doxycycline 100 MG capsule Commonly known as: VIBRAMYCIN   Ozempic (1 MG/DOSE) 4 MG/3ML Sopn Generic drug: Semaglutide (1 MG/DOSE)       TAKE these medications    amLODipine 10 MG tablet Commonly known as: NORVASC TAKE 1 TABLET BY MOUTH EVERY DAY   aspirin 81 MG EC tablet Take 1 tablet (81 mg total) by mouth daily.   BD Pen Needle Nano 2nd Gen 32G X 4 MM Misc Generic drug: Insulin Pen Needle USE TO INJECT INSULIN 4 TIMES A DAY   doxazosin 4 MG tablet Commonly known as: CARDURA TAKE 1 TABLET BY MOUTH AT BEDTIME.   Ferrous Gluconate 324 (37.5 Fe) MG Tabs Take 2 tablets by mouth daily.   fluticasone 50 MCG/ACT nasal spray Commonly known as: FLONASE PLACE 1 SPRAY INTO BOTH NOSTRILS 2 (TWO) TIMES DAILY. What changed: how much to take   FreeStyle Libre 2 Reader [facility] 1 each by Does not apply route daily.   FreeStyle Libre 2 Sensor Misc 1 each by Does not apply route every 14 (fourteen) days.   gentamicin cream 0.1 % Commonly known as: GARAMYCIN Apply 1 application topically 2 (two) times daily.   hydrALAZINE 100 MG tablet Commonly known as: APRESOLINE Take 100 mg by mouth 3 (three) times daily.   insulin lispro 100  UNIT/ML KwikPen Commonly known as: HumaLOG KwikPen INJECT 10-20 UNITS TOTAL INTO THE SKIN 3 (THREE) TIMES DAILY BEFORE MEALS. What changed:  how much to take how to take this when to take this   Lantus SoloStar 100 UNIT/ML Solostar Pen Generic drug: insulin glargine Inject 28 Units into the skin daily. What changed: how much to take   loratadine 5 MG/5ML syrup Commonly known as: CLARITIN Take 5 mg by mouth daily as needed for allergies or rhinitis.   metolazone 5 MG tablet Commonly known as: ZAROXOLYN Take 5 mg by mouth daily as needed (for extreme swelling).   metoprolol tartrate 100 MG tablet Commonly known as: LOPRESSOR TAKE 1 TABLET BY MOUTH TWICE A DAY   potassium chloride SA 20 MEQ tablet Commonly known as: KLOR-CON M Take 2 tablets (40 mEq total)  by mouth daily for 30 doses. What changed: how much to take   PRESCRIPTION MEDICATION CPAP: At bedtime   rosuvastatin 20 MG tablet Commonly known as: CRESTOR TAKE 1 TABLET BY MOUTH EVERY DAY   Torsemide 40 MG Tabs Take 40 mg by mouth 2 (two) times daily. What changed:  medication strength when to take this   Vitamin D3 50 MCG (2000 UT) Tabs Take 2,000 Units by mouth daily with breakfast.       No Known Allergies  Follow-up Information     Lorretta Harp, MD. Call today.   Specialties: Cardiology, Radiology Why: Please call for a posthospital follow-up appointment. Contact information: [address] [place] [address] [phone]         Samuel Carbon, MD. Call today.   Specialties: Internal Medicine, Pediatrics Why: Please call for a posthospital follow-up appointment. Contact information: [place] [address] [phone]         Rexene Agent, MD. Call today.   Specialty: Nephrology Why: Please call for a posthospital follow-up appointment. Contact information: [place] [address] [phone]                   The results of significant diagnostics from this hospitalization (including imaging, microbiology, ancillary and laboratory) are listed below for reference.    Significant Diagnostic Studies: DG Chest Port 1 View  Result Date: 03/10/2021 CLINICAL DATA:  Shortness of breath EXAM: PORTABLE CHEST 1 VIEW COMPARISON:  11/05/2020 FINDINGS: Heart is borderline in size. Bilateral perihilar and lower lobe airspace opacities could reflect edema or infection. No visible significant effusions or pneumothorax. No acute bony abnormality. IMPRESSION: Bilateral perihilar and lower lobe opacities could reflect edema or infection Electronically Signed   By: Rolm Baptise M.D.   On: 03/10/2021 00:02   DG Foot Complete Left  Result Date: 02/24/2021 CLINICAL DATA:  Left foot ulcer, initial encounter EXAM: LEFT FOOT - COMPLETE 3+ VIEW COMPARISON:  None. FINDINGS: Calcaneal spurring and tarsal degenerative changes noted. Mild distal soft tissue swelling is seen. Definitive bony erosive changes to suggest osteomyelitis are noted. No fracture or dislocation is seen. Healed fifth metatarsal fracture is again noted. IMPRESSION: Chronic changes without acute abnormality. Electronically Signed   By: Inez Catalina M.D.   On: 02/24/2021 00:35   ECHOCARDIOGRAM COMPLETE  Result Date: 03/10/2021    ECHOCARDIOGRAM REPORT   Patient Name:   Samuel Reed [facility] Date of Exam: 03/10/2021 Medical Rec #:  [id number]       Height:       72.0 in Accession #:    [id number]      Weight:       265.0 lb Date of Birth:  Mar 03, 1960       BSA:          2.401 m Patient Age:    61 years        BP:           147/53 mmHg Patient Gender: M               HR:           81 bpm. Exam Location:  Inpatient Procedure: 2D Echo, Color Doppler, Cardiac Doppler and Intracardiac            Opacification Agent Indications:    W10.27 Acute diastolic (congestive) heart failure  History:        Patient has prior history of Echocardiogram examinations,  most                  recent 02/01/2019. Risk Factors:Hypertension, Diabetes,                 Dyslipidemia and Sleep Apnea.  Sonographer:    Raquel Sarna Senior RDCS Referring Phys: [id number] Vernelle Emerald  Sonographer Comments: Technically difficult due to body habitus IMPRESSIONS  1. Left ventricular ejection fraction, by estimation, is 60 to 65%. The left ventricle has normal function. The left ventricle has no regional wall motion abnormalities. There is moderate asymmetric left ventricular hypertrophy of the septal segment. Left ventricular diastolic parameters are consistent with Grade III diastolic dysfunction (restrictive). Elevated left atrial pressure.  2. Right ventricular systolic function is normal. The right ventricular size is normal. Tricuspid regurgitation signal is inadequate for assessing PA pressure.  3. Left atrial size was severely dilated.  4. Right atrial size was mildly dilated.  5. The mitral valve is degenerative. Trivial mitral valve regurgitation. Moderate mitral annular calcification.  6. The aortic valve is tricuspid. Aortic valve regurgitation is not visualized. Aortic valve sclerosis is present, with no evidence of aortic valve stenosis.  7. The inferior vena cava is normal in size with greater than 50% respiratory variability, suggesting right atrial pressure of 3 mmHg. FINDINGS  Left Ventricle: Left ventricular ejection fraction, by estimation, is 60 to 65%. The left ventricle has normal function. The left ventricle has no regional wall motion abnormalities. Definity contrast agent was given IV to delineate the left ventricular  endocardial borders. The left ventricular internal cavity size was normal in size. There is moderate asymmetric left ventricular hypertrophy of the septal segment. Left ventricular diastolic parameters are consistent with Grade III diastolic dysfunction  (restrictive). Elevated left atrial pressure. Right Ventricle: The right ventricular size is normal. Right vetricular wall thickness  was not well visualized. Right ventricular systolic function is normal. Tricuspid regurgitation signal is inadequate for assessing PA pressure. Left Atrium: Left atrial size was severely dilated. Right Atrium: Right atrial size was mildly dilated. Pericardium: Trivial pericardial effusion is present. Mitral Valve: The mitral valve is degenerative in appearance. There is moderate calcification of the mitral valve leaflet(s). Moderate mitral annular calcification. Trivial mitral valve regurgitation. Tricuspid Valve: The tricuspid valve is normal in structure. Tricuspid valve regurgitation is not demonstrated. Aortic Valve: The aortic valve is tricuspid. Aortic valve regurgitation is not visualized. Aortic valve sclerosis is present, with no evidence of aortic valve stenosis. Pulmonic Valve: The pulmonic valve was not well visualized. Pulmonic valve regurgitation is not visualized. Aorta: The aortic root and ascending aorta are structurally normal, with no evidence of dilitation. Venous: The inferior vena cava is normal in size with greater than 50% respiratory variability, suggesting right atrial pressure of 3 mmHg. IAS/Shunts: The interatrial septum was not well visualized.  LEFT VENTRICLE PLAX 2D LVIDd:         5.50 cm   Diastology LVIDs:         3.80 cm   LV e' medial:    6.95 cm/s LV PW:         1.30 cm   LV E/e' medial:  19.0 LV IVS:        1.40 cm   LV e' lateral:   7.80 cm/s LVOT diam:     2.30 cm   LV E/e' lateral: 16.9 LV SV:         76 LV SV Index:   31 LVOT Area:  4.15 cm  RIGHT VENTRICLE RV S prime:     12.60 cm/s TAPSE (M-mode): 2.4 cm LEFT ATRIUM              Index        RIGHT ATRIUM           Index LA diam:        5.10 cm  2.12 cm/m   RA Area:     24.10 cm LA Vol (A2C):   104.0 ml 43.31 ml/m  RA Volume:   74.30 ml  30.94 ml/m LA Vol (A4C):   169.0 ml 70.38 ml/m LA Biplane Vol: 132.0 ml 54.97 ml/m  AORTIC VALVE LVOT Vmax:   99.80 cm/s LVOT Vmean:  73.200 cm/s LVOT VTI:    0.182 m  AORTA Ao  Root diam: 3.50 cm Ao Asc diam:  3.50 cm MITRAL VALVE MV Area (PHT): 4.33 cm     SHUNTS MV Decel Time: 175 msec     Systemic VTI:  0.18 m MV E velocity: 132.00 cm/s  Systemic Diam: 2.30 cm MV A velocity: 60.50 cm/s MV E/A ratio:  2.18 Oswaldo Milian MD Electronically signed by Oswaldo Milian MD Signature Date/Time: 03/10/2021/12:21:03 PM    Final     Microbiology: Recent Results (from the past 240 hour(s))  Resp Panel by RT-PCR (Flu A&B, Covid) Nasopharyngeal Swab     Status: None   Collection Time: 03/09/21 12:34 AM   Specimen: Nasopharyngeal Swab; Nasopharyngeal(NP) swabs in vial transport medium  Result Value Ref Range Status   SARS Coronavirus 2 by RT PCR NEGATIVE NEGATIVE Final    Comment: (NOTE) SARS-CoV-2 target nucleic acids are NOT DETECTED.  The SARS-CoV-2 RNA is generally detectable in upper respiratory specimens during the acute phase of infection. The lowest concentration of SARS-CoV-2 viral copies this assay can detect is 138 copies/mL. A negative result does not preclude SARS-Cov-2 infection and should not be used as the sole basis for treatment or other patient management decisions. A negative result may occur with  improper specimen collection/handling, submission of specimen other than nasopharyngeal swab, presence of viral mutation(s) within the areas targeted by this assay, and inadequate number of viral copies(<138 copies/mL). A negative result must be combined with clinical observations, patient history, and epidemiological information. The expected result is Negative.  Fact Sheet for Patients:  EntrepreneurPulse.com.au  Fact Sheet for Healthcare Providers:  IncredibleEmployment.be  This test is no t yet approved or cleared by the [place] FDA and  has been authorized for detection and/or diagnosis of SARS-CoV-2 by FDA under an Emergency Use Authorization (EUA). This EUA will remain  in effect (meaning this  test can be used) for the duration of the COVID-19 declaration under Section 564(b)(1) of the Act, 21 U.S.C.section 360bbb-3(b)(1), unless the authorization is terminated  or revoked sooner.       Influenza A by PCR NEGATIVE NEGATIVE Final   Influenza B by PCR NEGATIVE NEGATIVE Final    Comment: (NOTE) The Xpert Xpress SARS-CoV-2/FLU/RSV plus assay is intended as an aid in the diagnosis of influenza from Nasopharyngeal swab specimens and should not be used as a sole basis for treatment. Nasal washings and aspirates are unacceptable for Xpert Xpress SARS-CoV-2/FLU/RSV testing.  Fact Sheet for Patients: EntrepreneurPulse.com.au  Fact Sheet for Healthcare Providers: IncredibleEmployment.be  This test is not yet approved or cleared by the [place] FDA and has been authorized for detection and/or diagnosis of SARS-CoV-2 by FDA under an Emergency Use Authorization (EUA). This EUA will  remain in effect (meaning this test can be used) for the duration of the COVID-19 declaration under Section 564(b)(1) of the Act, 21 U.S.C. section 360bbb-3(b)(1), unless the authorization is terminated or revoked.  Performed at [facility], [place] [address], [place], [place] [address]   C Difficile Quick Screen w PCR reflex     Status: None   Collection Time: 03/10/21  5:55 AM   Specimen: STOOL  Result Value Ref Range Status   C Diff antigen NEGATIVE NEGATIVE Final   C Diff toxin NEGATIVE NEGATIVE Final   C Diff interpretation No C. difficile detected.  Final    Comment: Performed at [facility], [place] [address] [place], [place] [address]     Labs: Basic Metabolic Panel: Recent Labs  Lab 03/09/21 2338 03/10/21 0454 03/11/21 0639 03/11/21 1425  NA 140 140 141  --   K 3.6 3.0* 2.7* 3.2*  CL 106 104 102  --   CO2 19* 21* 23  --   GLUCOSE 160* 197* 141*  --   BUN 79* 78* 92*  --   CREATININE 4.72* 4.86* 5.07*  --   CALCIUM 9.1 9.4 9.5   --   MG  --  2.0 2.1  --   PHOS  --   --  4.1  --    Liver Function Tests: Recent Labs  Lab 03/09/21 2338 03/10/21 0454 03/11/21 0639  AST 22 22  --   ALT 18 24  --   ALKPHOS 59 61  --   BILITOT 2.3* 2.3*  --   PROT 6.9 7.5  --   ALBUMIN 3.4* 3.5 3.1*   No results for input(s): LIPASE, AMYLASE in the last 168 hours. No results for input(s): AMMONIA in the last 168 hours. CBC: Recent Labs  Lab 03/09/21 2338 03/10/21 0454  WBC 10.8* 10.1  NEUTROABS 9.2* 8.6*  HGB 11.1* 11.0*  HCT 35.3* 33.5*  MCV 91.2 86.3  PLT 158 179   Cardiac Enzymes: No results for input(s): CKTOTAL, CKMB, CKMBINDEX, TROPONINI in the last 168 hours. BNP: BNP (last 3 results) Recent Labs    03/09/21 2338  BNP 889.9*    ProBNP (last 3 results) No results for input(s): PROBNP in the last 8760 hours.  CBG: Recent Labs  Lab 03/10/21 1322 03/10/21 1608 03/10/21 2116 03/11/21 0600 03/11/21 1107  GLUCAP 197* 198* 158* 139* 239*       Signed:  Kayleen Memos, MD [facility] 03/15/2021, 3:18 PM

## 2021-03-11 NOTE — Consult Note (Addendum)
Progress Note  Patient Name: Samuel Reed Date of Encounter: 03/11/2021  Primary Cardiologist: Quay Burow, MD   Subjective   [name] is a 61 year old male with past medical history of hypertension, HFpEF, OSA, type 2 diabetes, CKD 5, pericardial effusion s/p pericardiocentesis 12/2018 who presents to the hospital for worsening dyspnea.  Patient report worsening shortness of breath for the last week.  Initially only started with exertion but progressed to dyspnea at rest.  He denies chest pain, arm pain or jaw pain.  Also reports worsening of lower extremity edema.  He reports adherence to his torsemide but has not been taking his metolazone.  He thought that he produced a normal amount of urine daily before this admission but he is not sure if it is adequate.   He had history of a large circumferential pericardial effusion with features of tamponade on echo 11/2018.  He underwent pericardiectomy with pericardial drainage of 750 cc of bloody fluid, which was nonmalignant and noninfected.  Repeat echo on 01/2019 showed resolution of pericardial effusion with normal LV function.  Inpatient Medications    Scheduled Meds:  amLODipine  10 mg Oral Daily   aspirin EC  81 mg Oral Daily   doxazosin  4 mg Oral QHS   fluticasone  2 spray Each Nare BID   heparin  5,000 Units Subcutaneous Q8H   hydrALAZINE  100 mg Oral Q8H   insulin aspart  0-15 Units Subcutaneous TID AC & HS   insulin aspart  10 Units Subcutaneous TID WC   insulin glargine-yfgn  28 Units Subcutaneous Daily   metoprolol tartrate  100 mg Oral BID   [START ON 03/12/2021] potassium chloride  40 mEq Oral Daily   rosuvastatin  20 mg Oral Daily   torsemide  40 mg Oral BID   Continuous Infusions:  PRN Meds: acetaminophen **OR** acetaminophen, ondansetron **OR** ondansetron (ZOFRAN) IV, polyethylene glycol   Vital Signs    Vitals:   03/11/21 0445 03/11/21 0826 03/11/21 0930 03/11/21 1109  BP: 132/62 131/64  132/73   Pulse: 64 69 68 63  Resp: 18 16  17   Temp: 97.6 F (36.4 C) 98.4 F (36.9 C)  98.5 F (36.9 C)  TempSrc: Oral Oral  Oral  SpO2: 97% 100%  98%  Weight: 116.6 kg     Height:        Intake/Output Summary (Last 24 hours) at 03/11/2021 1334 Last data filed at 03/11/2021 1253 Gross per 24 hour  Intake 655 ml  Output 3906 ml  Net -3251 ml   Filed Weights   03/10/21 1510 03/11/21 0012 03/11/21 0445  Weight: 118.8 kg 118 kg 116.6 kg    Telemetry    NSR with intermittent sinus bradycardia- Personally Reviewed  ECG    2/12: NSR, first-degree AV block, left axis deviation, RBBB, bifascicular block- Personally Reviewed  Physical Exam   GEN: No acute distress.   Neck: No JVD Cardiac: RRR, no murmurs, rubs, or gallops.  Respiratory: Clear to auscultation bilaterally.  No wheezing or crackles GI: Normal bowel sound MS: Trace edema of bilateral lower extremity.  Extremities warm to touch. Neuro:  Nonfocal  Psych: Normal affect   Labs    Chemistry Recent Labs  Lab 03/09/21 2338 03/10/21 0454 03/11/21 0639  NA 140 140 141  K 3.6 3.0* 2.7*  CL 106 104 102  CO2 19* 21* 23  GLUCOSE 160* 197* 141*  BUN 79* 78* 92*  CREATININE 4.72* 4.86* 5.07*  CALCIUM 9.1 9.4  9.5  PROT 6.9 7.5  --   ALBUMIN 3.4* 3.5 3.1*  AST 22 22  --   ALT 18 24  --   ALKPHOS 59 61  --   BILITOT 2.3* 2.3*  --   GFRNONAA 13* 13* 12*  ANIONGAP 15 15 16*     Hematology Recent Labs  Lab 03/09/21 2338 03/10/21 0454  WBC 10.8* 10.1  RBC 3.87* 3.88*  HGB 11.1* 11.0*  HCT 35.3* 33.5*  MCV 91.2 86.3  MCH 28.7 28.4  MCHC 31.4 32.8  RDW 13.9 13.7  PLT 158 179    Cardiac EnzymesNo results for input(s): TROPONINI in the last 168 hours. No results for input(s): TROPIPOC in the last 168 hours.   BNP Recent Labs  Lab 03/09/21 2338  BNP 889.9*     DDimer No results for input(s): DDIMER in the last 168 hours.   Radiology    DG Chest Port 1 View  Result Date: 03/10/2021 CLINICAL DATA:   Shortness of breath EXAM: PORTABLE CHEST 1 VIEW COMPARISON:  11/05/2020 FINDINGS: Heart is borderline in size. Bilateral perihilar and lower lobe airspace opacities could reflect edema or infection. No visible significant effusions or pneumothorax. No acute bony abnormality. IMPRESSION: Bilateral perihilar and lower lobe opacities could reflect edema or infection Electronically Signed   By: Rolm Baptise M.D.   On: 03/10/2021 00:02   ECHOCARDIOGRAM COMPLETE  Result Date: 03/10/2021    ECHOCARDIOGRAM REPORT   Patient Name:   Samuel Reed [facility] Date of Exam: 03/10/2021 Medical Rec #:  [id number]       Height:       72.0 in Accession #:    [id number]      Weight:       265.0 lb Date of Birth:  [birth year]       BSA:          2.401 m Patient Age:    49 years        BP:           147/53 mmHg Patient Gender: M               HR:           81 bpm. Exam Location:  Inpatient Procedure: 2D Echo, Color Doppler, Cardiac Doppler and Intracardiac            Opacification Agent Indications:    L38.10 Acute diastolic (congestive) heart failure  History:        Patient has prior history of Echocardiogram examinations, most                 recent 02/01/2019. Risk Factors:Hypertension, Diabetes,                 Dyslipidemia and Sleep Apnea.  Sonographer:    Raquel Sarna Senior RDCS Referring Phys: [id number] Vernelle Emerald  Sonographer Comments: Technically difficult due to body habitus IMPRESSIONS  1. Left ventricular ejection fraction, by estimation, is 60 to 65%. The left ventricle has normal function. The left ventricle has no regional wall motion abnormalities. There is moderate asymmetric left ventricular hypertrophy of the septal segment. Left ventricular diastolic parameters are consistent with Grade III diastolic dysfunction (restrictive). Elevated left atrial pressure.  2. Right ventricular systolic function is normal. The right ventricular size is normal. Tricuspid regurgitation signal is inadequate for assessing PA pressure.  3. Left  atrial size was severely dilated.  4. Right atrial size was mildly dilated.  5. The mitral valve is degenerative.  Trivial mitral valve regurgitation. Moderate mitral annular calcification.  6. The aortic valve is tricuspid. Aortic valve regurgitation is not visualized. Aortic valve sclerosis is present, with no evidence of aortic valve stenosis.  7. The inferior vena cava is normal in size with greater than 50% respiratory variability, suggesting right atrial pressure of 3 mmHg. FINDINGS  Left Ventricle: Left ventricular ejection fraction, by estimation, is 60 to 65%. The left ventricle has normal function. The left ventricle has no regional wall motion abnormalities. Definity contrast agent was given IV to delineate the left ventricular  endocardial borders. The left ventricular internal cavity size was normal in size. There is moderate asymmetric left ventricular hypertrophy of the septal segment. Left ventricular diastolic parameters are consistent with Grade III diastolic dysfunction  (restrictive). Elevated left atrial pressure. Right Ventricle: The right ventricular size is normal. Right vetricular wall thickness was not well visualized. Right ventricular systolic function is normal. Tricuspid regurgitation signal is inadequate for assessing PA pressure. Left Atrium: Left atrial size was severely dilated. Right Atrium: Right atrial size was mildly dilated. Pericardium: Trivial pericardial effusion is present. Mitral Valve: The mitral valve is degenerative in appearance. There is moderate calcification of the mitral valve leaflet(s). Moderate mitral annular calcification. Trivial mitral valve regurgitation. Tricuspid Valve: The tricuspid valve is normal in structure. Tricuspid valve regurgitation is not demonstrated. Aortic Valve: The aortic valve is tricuspid. Aortic valve regurgitation is not visualized. Aortic valve sclerosis is present, with no evidence of aortic valve stenosis. Pulmonic Valve: The pulmonic  valve was not well visualized. Pulmonic valve regurgitation is not visualized. Aorta: The aortic root and ascending aorta are structurally normal, with no evidence of dilitation. Venous: The inferior vena cava is normal in size with greater than 50% respiratory variability, suggesting right atrial pressure of 3 mmHg. IAS/Shunts: The interatrial septum was not well visualized.  LEFT VENTRICLE PLAX 2D LVIDd:         5.50 cm   Diastology LVIDs:         3.80 cm   LV e' medial:    6.95 cm/s LV PW:         1.30 cm   LV E/e' medial:  19.0 LV IVS:        1.40 cm   LV e' lateral:   7.80 cm/s LVOT diam:     2.30 cm   LV E/e' lateral: 16.9 LV SV:         76 LV SV Index:   31 LVOT Area:     4.15 cm  RIGHT VENTRICLE RV S prime:     12.60 cm/s TAPSE (M-mode): 2.4 cm LEFT ATRIUM              Index        RIGHT ATRIUM           Index LA diam:        5.10 cm  2.12 cm/m   RA Area:     24.10 cm LA Vol (A2C):   104.0 ml 43.31 ml/m  RA Volume:   74.30 ml  30.94 ml/m LA Vol (A4C):   169.0 ml 70.38 ml/m LA Biplane Vol: 132.0 ml 54.97 ml/m  AORTIC VALVE LVOT Vmax:   99.80 cm/s LVOT Vmean:  73.200 cm/s LVOT VTI:    0.182 m  AORTA Ao Root diam: 3.50 cm Ao Asc diam:  3.50 cm MITRAL VALVE MV Area (PHT): 4.33 cm     SHUNTS MV Decel Time: 175 msec  Systemic VTI:  0.18 m MV E velocity: 132.00 cm/s  Systemic Diam: 2.30 cm MV A velocity: 60.50 cm/s MV E/A ratio:  2.18 Oswaldo Milian MD Electronically signed by Oswaldo Milian MD Signature Date/Time: 03/10/2021/12:21:03 PM    Final     Cardiac Studies   ECHOCARDIOGRAM COMPLETE  Result Date: 03/10/2021    ECHOCARDIOGRAM REPORT   Patient Name:   ROHAIL KLEES [facility] Date of Exam: 03/10/2021 Medical Rec #:  [id number]       Height:       72.0 in Accession #:    [id number]      Weight:       265.0 lb Date of Birth:  1960-03-08       BSA:          2.401 m Patient Age:    43 years        BP:           147/53 mmHg Patient Gender: M               HR:           81 bpm. Exam Location:   Inpatient Procedure: 2D Echo, Color Doppler, Cardiac Doppler and Intracardiac            Opacification Agent Indications:    O03.21 Acute diastolic (congestive) heart failure  History:        Patient has prior history of Echocardiogram examinations, most                 recent 02/01/2019. Risk Factors:Hypertension, Diabetes,                 Dyslipidemia and Sleep Apnea.  Sonographer:    Raquel Sarna Senior RDCS Referring Phys: [id number] Vernelle Emerald  Sonographer Comments: Technically difficult due to body habitus IMPRESSIONS  1. Left ventricular ejection fraction, by estimation, is 60 to 65%. The left ventricle has normal function. The left ventricle has no regional wall motion abnormalities. There is moderate asymmetric left ventricular hypertrophy of the septal segment. Left ventricular diastolic parameters are consistent with Grade III diastolic dysfunction (restrictive). Elevated left atrial pressure.  2. Right ventricular systolic function is normal. The right ventricular size is normal. Tricuspid regurgitation signal is inadequate for assessing PA pressure.  3. Left atrial size was severely dilated.  4. Right atrial size was mildly dilated.  5. The mitral valve is degenerative. Trivial mitral valve regurgitation. Moderate mitral annular calcification.  6. The aortic valve is tricuspid. Aortic valve regurgitation is not visualized. Aortic valve sclerosis is present, with no evidence of aortic valve stenosis.  7. The inferior vena cava is normal in size with greater than 50% respiratory variability, suggesting right atrial pressure of 3 mmHg.   Patient Profile     Virginia Francisco is a 61 year old male with past medical history of hypertension, HFpEF, OSA, type 2 diabetes, CKD 5, pericardial effusion s/p pericardiocentesis 12/2018 who presents to the hospital for worsening dyspnea, secondary to acute HFpEF exacerbation.  Assessment & Plan    Acute HFpEF exacerbation Likely multifactorial from not taking  metolazone in combination with worsening of CKD.  The echocardiogram showed normal EF with worsening asymmetric septal LVH.  The left atrium also severely dilated 2/2 diastolic dysfunction.  He was diuresed well with IV Lasix and was started on increased dose of torsemide due to worsening CKD.  He is currently breathing well on room air and appears euvolemic on exam. -His dry weight is around 260 lbs -Continue  torsemide and metolazone after discharge.  Patient understands when to take metolazone as needed for LE edema or increase in Wt.  -Follow-up with [name] outpatient  Elevated troponin Troponin flat at 55-52.  No ischemic changes on EKG.  His left axis deviation and RBBB is not new since 2020.  RBBB is likely 2/2 OSA.  Repeat echo did not show any LV regional wall abnormality.  Left heart cath in 2017 only showed 30% stenosis of proximal to mid LAD.  He never had chest pain. -Low suspicion for ACS or NSTEMI.  Suspect type II MI secondary to volume overload.  CKD 5 He has great urine output with IV Lasix.  No urgent indication for dialysis while inpatient.  He may start on home PD at follow-up.  He is on the kidney transplant list. -Follow-up with [name] after discharge -Potassium supplement per primary team.  Hypertension -Continue amlodipine, doxazosin and hydralazine  OSA -Continue home CPAP  Type 2 diabetes -Per primary team   For questions or updates, please contact [facility] Please consult [email or web address] for contact info under Cardiology/STEMI.      Signed, Gaylan Gerold, DO  03/11/2021, 1:34 PM    I have seen and examined the patient along with Gaylan Gerold, DO .  I have reviewed the chart, notes and new data.  I agree with his note.  Key new complaints: Improved dyspnea and resolved edema. Key examination changes: Normal JVD, no lower extremity edema, no S3.  Regular rhythm. Key new findings / data: Creatinine essentially unchanged at approximately 5.0, BUN  in the 80-90 range  PLAN: I think if he waits until he sees signs of peripheral edema to the dose metolazone he will already have problems with shortness of breath due to left heart failure.  Would recommend taking metolazone on days when his weight exceeds 260 pounds.  This "dry weight" estimation can be further refined based on information from the home weight log and record of his symptoms. It is probably time to lay groundwork for peritoneal dialysis access, since it is possible he will require dialysis within the next 6-12 months.  Sanda Klein, MD, [place] [phone] 03/11/2021, 2:32 PM

## 2021-03-11 NOTE — TOC Progression Note (Signed)
Transition of Care [facility]) - Progression Note    Patient Details  Name: Samuel Reed MRN: [id number] Date of Birth: 1960/04/29  [facility] CM/SW Contact  Zenon Mayo, RN Phone Number: 03/11/2021, 3:50 PM  Clinical Narrative:     Patient is for dc today,, he has no needs.        Expected Discharge Plan and Services           Expected Discharge Date: 03/11/21                                     Social Determinants of Health (SDOH) Interventions    Readmission Risk Interventions No flowsheet data found.

## 2021-03-11 NOTE — Consult Note (Addendum)
[facility] Nephrology Consultation Note  Requesting MD: Dr. Irene Pap Reason for consult: CHF exacerbation in patient with CKD stage 5, inadequate diuresis and worsening renal function on lasix  HPI:  Samuel Reed is a 61 y.o. male with PMH of CKD stage 4 (follows [name]), T2DM, HFpEF (EF 55-60%, G3DD), HTN, HLD, OSA presented to the ED 2/13 with SOB, found to be in acute CHF exacerbation with pulmonary edema. Nephrology consulted due to inadequate diuresis and worsening renal function on lasix.   Patient sees [name] at [facility].  Last office visit scanned into epic is from 12/09/2020, which lists his creatinine at 3.9, BUN 49. At home takes torsemide 40 mg daily.  Has been under consideration for PD in the future. Also under evaluation for transplant by [facility].   Creatinine this morning 5.07, BUN 92 increased from 4.86 and 78 on admission after receiving Lasix 80 mg IV x3.  Urine output 3 L last 24 hours.  Patient this morning in good spirits. Says he is breathing better, a lot less swollen, less abdominal distention, and is having increased UOP from baseline. No complaints.   Creat  Date/Time Value Ref Range Status  06/28/2015 09:22 AM 1.80 (H) 0.70 - 1.33 mg/dL Final   Creatinine, Ser  Date/Time Value Ref Range Status  03/11/2021 06:39 AM 5.07 (H) 0.61 - 1.24 mg/dL Final  03/10/2021 04:54 AM 4.86 (H) 0.61 - 1.24 mg/dL Final  03/09/2021 11:38 PM 4.72 (H) 0.61 - 1.24 mg/dL Final  11/05/2020 12:09 PM 4.93 (HH) 0.40 - 1.50 mg/dL Final  08/30/2020 08:14 AM 4.41 (H) 0.40 - 1.50 mg/dL Final  09/25/2019 01:39 PM 4.02 (H) 0.61 - 1.24 mg/dL Final  01/19/2019 02:42 AM 3.20 (H) 0.61 - 1.24 mg/dL Final  01/18/2019 07:59 AM 3.12 (H) 0.61 - 1.24 mg/dL Final  01/17/2019 02:14 AM 3.09 (H) 0.61 - 1.24 mg/dL Final  01/16/2019 06:25 PM 3.40 (H) 0.61 - 1.24 mg/dL Final  01/16/2019 04:06 AM 3.77 (H) 0.61 - 1.24 mg/dL Final  01/15/2019 01:28 AM 4.51 (H) 0.61 - 1.24  mg/dL Final  01/14/2019 10:11 PM 4.73 (H) 0.61 - 1.24 mg/dL Final  08/26/2018 08:02 AM 2.93 (H) 0.40 - 1.50 mg/dL Final  01/15/2017 03:42 AM 3.00 (H) 0.61 - 1.24 mg/dL Final  01/14/2017 07:18 AM 3.27 (H) 0.61 - 1.24 mg/dL Final  01/13/2017 02:36 PM 3.36 (H) 0.61 - 1.24 mg/dL Final  08/07/2016 12:54 PM 2.78 (H) 0.40 - 1.50 mg/dL Final  02/12/2015 10:04 AM 1.81 (H) 0.61 - 1.24 mg/dL Final  12/07/2014 11:23 AM 1.51 (H) 0.40 - 1.50 mg/dL Final  10/18/2013 10:56 AM 1.5 0.4 - 1.5 mg/dL Final  05/30/2013 04:04 PM 1.9 (H) 0.4 - 1.5 mg/dL Final  11/25/2012 12:27 PM 1.1 0.4 - 1.5 mg/dL Final  04/15/2012 11:52 AM 1.03 0.50 - 1.35 mg/dL Final  06/26/2011 12:22 PM 1.2 0.4 - 1.5 mg/dL Final  12/10/2010 09:04 AM 1.1 0.4 - 1.5 mg/dL Final  05/07/2010 10:37 AM 1.2 0.4 - 1.5 mg/dL Final  11/13/2009 09:13 AM 1.1 0.4 - 1.5 mg/dL Final  08/06/2008 04:12 PM 1.1 0.4 - 1.5 mg/dL Final  03/19/2008 09:36 AM 1.0 0.4 - 1.5 mg/dL Final  02/17/2008 01:20 PM 1.1 0.4 - 1.5 mg/dL Final  01/05/2008 09:42 AM 1.0 0.4 - 1.5 mg/dL Final  10/27/2007 08:16 AM 1.1  Final  09/15/2007 02:47 PM 1.4 0.4 - 1.5 mg/dL Final    PMHx: Past Medical History:  Diagnosis Date   Allergy  Anemia    Chronic venous insufficiency    CKD (chronic kidney disease), stage III ([place])    Diabetes mellitus type II 2003   HLD (hyperlipidemia)    HTN (hypertension) 1980's   Neuromuscular disorder (HCC)    neuropathy feet    OSA (obstructive sleep apnea)    Pericardial effusion    S/P pericardial window 01/16/2019   Sleep apnea    wear cpap     Past Surgical History:  Procedure Laterality Date   "Bone removal" from back  [place] N/A 02/12/2015   Procedure: Left Heart Cath and Coronary Angiography;  Surgeon: Adrian Prows, MD;  Location: [facility];  Service: Cardiovascular;  Laterality: N/A;   COLONOSCOPY     KNEE CARTILAGE SURGERY     left knee   LUMBAR [place]    PERICARDIOCENTESIS N/A 01/16/2019   Procedure: PERICARDIOCENTESIS;  Surgeon: Troy Sine, MD;  Location: [facility];  Service: Cardiovascular;  Laterality: N/A;   POLYPECTOMY     VIDEO ASSISTED THORACOSCOPY Right 01/17/2019   Procedure: VIDEO ASSISTED THORACOSCOPY, pericardial window for pericardium and pericardial fluid.;  Surgeon: Lajuana Matte, MD;  Location: [facility];  Service: Thoracic;  Laterality: Right;    Family Hx:  Family History  Problem Relation Age of Onset   COPD Father    Cancer Father        prostate   Prostate cancer Father    Diabetes Mother    Coronary artery disease Paternal Grandfather    Colon cancer Neg Hx    Colon polyps Neg Hx    Esophageal cancer Neg Hx    Rectal cancer Neg Hx    Stomach cancer Neg Hx     Social History:  reports that he has quit smoking. His smoking use included pipe. He has never used smokeless tobacco. He reports that he does not drink alcohol and does not use drugs.  Allergies: No Known Allergies  Medications: Prior to Admission medications   Medication Sig Start Date End Date Taking? Authorizing Provider  amLODipine (NORVASC) 10 MG tablet TAKE 1 TABLET BY MOUTH EVERY DAY Patient taking differently: Take 10 mg by mouth daily. 07/15/20  Yes Viviana Simpler I, MD  aspirin EC 81 MG EC tablet Take 1 tablet (81 mg total) by mouth daily. 01/19/19  Yes Sande Rives E, PA-C  Cholecalciferol (VITAMIN D3) 50 MCG (2000 UT) TABS Take 2,000 Units by mouth daily with breakfast.   Yes [provider]  doxazosin (CARDURA) 4 MG tablet TAKE 1 TABLET BY MOUTH AT BEDTIME. Patient taking differently: Take 4 mg by mouth at bedtime. 09/11/20  Yes [name], MD  Ferrous Gluconate 324 (37.5 Fe) MG TABS Take 2 tablets by mouth daily.    Yes [provider]  fluticasone (FLONASE) 50 MCG/ACT nasal spray PLACE 1 SPRAY INTO BOTH NOSTRILS 2 (TWO) TIMES DAILY. Patient taking differently: Place 2 sprays into both  nostrils 2 (two) times daily. 09/26/18  Yes [name], MD  gentamicin cream (GARAMYCIN) 0.1 % Apply 1 application topically 2 (two) times daily. 02/26/21  Yes Edrick Kins, DPM  hydrALAZINE (APRESOLINE) 100 MG tablet Take 100 mg by mouth 3 (three) times daily.   Yes [provider]  insulin glargine (LANTUS SOLOSTAR) 100 UNIT/ML Solostar Pen Inject 55 Units into the skin daily. 08/30/20  Yes Philemon Kingdom, MD  insulin lispro (HUMALOG KWIKPEN) 100 UNIT/ML  KwikPen INJECT 10-20 UNITS TOTAL INTO THE SKIN 3 (THREE) TIMES DAILY BEFORE MEALS. Patient taking differently: Inject 20 Units into the skin 3 (three) times daily. INJECT 10-20 UNITS TOTAL INTO THE SKIN 3 (THREE) TIMES DAILY BEFORE MEALS. 01/07/21  Yes Philemon Kingdom, MD  KLOR-CON M20 20 MEQ tablet Take 20 mEq by mouth daily. 08/20/19  Yes [provider]  loratadine (CLARITIN) 5 MG/5ML syrup Take 5 mg by mouth daily as needed for allergies or rhinitis.   Yes [provider]  metolazone (ZAROXOLYN) 5 MG tablet Take 5 mg by mouth daily as needed (for extreme swelling).   Yes [provider]  rosuvastatin (CRESTOR) 20 MG tablet TAKE 1 TABLET BY MOUTH EVERY DAY Patient taking differently: Take 20 mg by mouth daily. 07/15/20  Yes [name], MD  Semaglutide, 1 MG/DOSE, (OZEMPIC, 1 MG/DOSE,) 4 MG/3ML SOPN Inject 1 mg into the skin once a week. 08/02/20  Yes Philemon Kingdom, MD  torsemide (DEMADEX) 20 MG tablet Take 40 mg by mouth daily.  10/05/17  Yes [provider]  BD PEN NEEDLE NANO 2ND GEN 32G X 4 MM MISC USE TO INJECT INSULIN 4 TIMES A DAY 01/24/20   Philemon Kingdom, MD  Continuous Blood Gluc Receiver (FREESTYLE LIBRE 2 READER) DEVI 1 each by Does not apply route daily. 03/14/19   Philemon Kingdom, MD  Continuous Blood Gluc Sensor (FREESTYLE LIBRE 2 SENSOR) MISC 1 each by Does not apply route every 14 (fourteen) days. 01/06/21   Philemon Kingdom, MD  doxycycline (VIBRAMYCIN) 100 MG  capsule Take 1 capsule (100 mg total) by mouth 2 (two) times daily. One po bid x 7 days Patient not taking: Reported on 03/09/2021 02/24/21   Palumbo, April, MD  metoprolol tartrate (LOPRESSOR) 100 MG tablet TAKE 1 TABLET BY MOUTH TWICE A DAY Patient taking differently: Take 100 mg by mouth 2 (two) times daily. 09/25/20   [name], MD  PRESCRIPTION MEDICATION CPAP: At bedtime    [provider]    I have reviewed the patient's current medications.  Labs:  Results for orders placed or performed during the hospital encounter of 03/09/21 (from the past 48 hour(s))  Comprehensive metabolic panel     Status: Abnormal   Collection Time: 03/09/21 11:38 PM  Result Value Ref Range   Sodium 140 135 - 145 mmol/L   Potassium 3.6 3.5 - 5.1 mmol/L   Chloride 106 98 - 111 mmol/L   CO2 19 (L) 22 - 32 mmol/L   Glucose, Bld 160 (H) 70 - 99 mg/dL    Comment: Glucose reference range applies only to samples taken after fasting for at least 8 hours.   BUN 79 (H) 6 - 20 mg/dL   Creatinine, Ser 4.72 (H) 0.61 - 1.24 mg/dL   Calcium 9.1 8.9 - 10.3 mg/dL   Total Protein 6.9 6.5 - 8.1 g/dL   Albumin 3.4 (L) 3.5 - 5.0 g/dL   AST 22 15 - 41 U/L   ALT 18 0 - 44 U/L   Alkaline Phosphatase 59 38 - 126 U/L   Total Bilirubin 2.3 (H) 0.3 - 1.2 mg/dL   GFR, Estimated 13 (L) >60 mL/min    Comment: (NOTE) Calculated using the CKD-EPI Creatinine Equation (2021)    Anion gap 15 5 - 15    Comment: Performed at [facility], [place] [address] [place], [place] [address]  Brain natriuretic peptide     Status: Abnormal   Collection Time: 03/09/21 11:38  PM  Result Value Ref Range   B Natriuretic Peptide 889.9 (H) 0.0 - 100.0 pg/mL    Comment: Performed at [facility] [address] [place], [place] [address]  CBC with Differential     Status: Abnormal   Collection Time: 03/09/21 11:38 PM  Result Value Ref Range   WBC 10.8 (H) 4.0 - 10.5 K/uL   RBC 3.87 (L) 4.22 - 5.81 MIL/uL   Hemoglobin  11.1 (L) 13.0 - 17.0 g/dL   HCT 35.3 (L) 39.0 - 52.0 %   MCV 91.2 80.0 - 100.0 fL   MCH 28.7 26.0 - 34.0 pg   MCHC 31.4 30.0 - 36.0 g/dL   RDW 13.9 11.5 - 15.5 %   Platelets 158 150 - 400 K/uL   nRBC 0.0 0.0 - 0.2 %   Neutrophils Relative % 85 %   Neutro Abs 9.2 (H) 1.7 - 7.7 K/uL   Lymphocytes Relative 7 %   Lymphs Abs 0.8 0.7 - 4.0 K/uL   Monocytes Relative 6 %   Monocytes Absolute 0.6 0.1 - 1.0 K/uL   Eosinophils Relative 1 %   Eosinophils Absolute 0.1 0.0 - 0.5 K/uL   Basophils Relative 0 %   Basophils Absolute 0.0 0.0 - 0.1 K/uL   Immature Granulocytes 1 %   Abs Immature Granulocytes 0.08 (H) 0.00 - 0.07 K/uL    Comment: Performed at [facility] [address] [place], [place] [address]  Troponin I (High Sensitivity)     Status: Abnormal   Collection Time: 03/09/21 11:38 PM  Result Value Ref Range   Troponin I (High Sensitivity) 55 (H) <18 ng/L    Comment: (NOTE) Elevated high sensitivity troponin I (hsTnI) values and significant  changes across serial measurements may suggest ACS but many other  chronic and acute conditions are known to elevate hsTnI results.  Refer to the "Links" section for chest pain algorithms and additional  guidance. Performed at [facility], [place] [address] [place], [place] [address]   Troponin I (High Sensitivity)     Status: Abnormal   Collection Time: 03/10/21  1:40 AM  Result Value Ref Range   Troponin I (High Sensitivity) 52 (H) <18 ng/L    Comment: (NOTE) Elevated high sensitivity troponin I (hsTnI) values and significant  changes across serial measurements may suggest ACS but many other  chronic and acute conditions are known to elevate hsTnI results.  Refer to the "Links" section for chest pain algorithms and additional  guidance. Performed at [facility], [place] [address] [place], [place] [address]   Hemoglobin A1c     Status: Abnormal   Collection Time: 03/10/21  4:54 AM  Result Value Ref Range   Hgb A1c  MFr Bld 6.2 (H) 4.8 - 5.6 %    Comment: (NOTE) Pre diabetes:          5.7%-6.4%  Diabetes:              >6.4%  Glycemic control for   <7.0% adults with diabetes    Mean Plasma Glucose 131.24 mg/dL    Comment: Performed at [facility] [address], [place], [place] [address]  HIV Antibody (routine testing w rflx)     Status: None   Collection Time: 03/10/21  4:54 AM  Result Value Ref Range   HIV Screen 4th Generation wRfx Non Reactive Non Reactive    Comment: Performed at [facility], [place]  [address], [place], [place] [address]  Comprehensive metabolic panel     Status: Abnormal   Collection Time: 03/10/21  4:54 AM  Result Value Ref Range   Sodium 140 135 - 145 mmol/L   Potassium 3.0 (L) 3.5 - 5.1 mmol/L   Chloride 104 98 - 111 mmol/L   CO2 21 (L) 22 - 32 mmol/L   Glucose, Bld 197 (H) 70 - 99 mg/dL    Comment: Glucose reference range applies only to samples taken after fasting for at least 8 hours.   BUN 78 (H) 6 - 20 mg/dL   Creatinine, Ser 4.86 (H) 0.61 - 1.24 mg/dL   Calcium 9.4 8.9 - 10.3 mg/dL   Total Protein 7.5 6.5 - 8.1 g/dL   Albumin 3.5 3.5 - 5.0 g/dL   AST 22 15 - 41 U/L   ALT 24 0 - 44 U/L   Alkaline Phosphatase 61 38 - 126 U/L   Total Bilirubin 2.3 (H) 0.3 - 1.2 mg/dL   GFR, Estimated 13 (L) >60 mL/min    Comment: (NOTE) Calculated using the CKD-EPI Creatinine Equation (2021)    Anion gap 15 5 - 15    Comment: Performed at [facility] [address] [place], [place] [address]  Magnesium     Status: None   Collection Time: 03/10/21  4:54 AM  Result Value Ref Range   Magnesium 2.0 1.7 - 2.4 mg/dL    Comment: Performed at [facility] [address] [place], [place] [address]  CBC WITH DIFFERENTIAL     Status: Abnormal   Collection Time: 03/10/21  4:54 AM  Result Value Ref Range   WBC 10.1 4.0 - 10.5 K/uL   RBC 3.88 (L) 4.22 - 5.81 MIL/uL   Hemoglobin 11.0 (L) 13.0 - 17.0 g/dL   HCT 33.5 (L) 39.0 - 52.0 %   MCV 86.3 80.0 -  100.0 fL   MCH 28.4 26.0 - 34.0 pg   MCHC 32.8 30.0 - 36.0 g/dL   RDW 13.7 11.5 - 15.5 %   Platelets 179 150 - 400 K/uL   nRBC 0.0 0.0 - 0.2 %   Neutrophils Relative % 84 %   Neutro Abs 8.6 (H) 1.7 - 7.7 K/uL   Lymphocytes Relative 8 %   Lymphs Abs 0.8 0.7 - 4.0 K/uL   Monocytes Relative 5 %   Monocytes Absolute 0.5 0.1 - 1.0 K/uL   Eosinophils Relative 1 %   Eosinophils Absolute 0.1 0.0 - 0.5 K/uL   Basophils Relative 1 %   Basophils Absolute 0.1 0.0 - 0.1 K/uL   Immature Granulocytes 1 %   Abs Immature Granulocytes 0.07 0.00 - 0.07 K/uL    Comment: Performed at [facility], [address] [place], [place] [address]  C Difficile Quick Screen w PCR reflex     Status: None   Collection Time: 03/10/21  5:55 AM   Specimen: STOOL  Result Value Ref Range   C Diff antigen NEGATIVE NEGATIVE   C Diff toxin NEGATIVE NEGATIVE   C Diff interpretation No C. difficile detected.     Comment: Performed at [facility], [place] [address], [place], [place] [address]  Urinalysis, Complete w Microscopic Urine, Clean Catch     Status: Abnormal   Collection Time: 03/10/21  6:23 AM  Result Value Ref Range   Color, Urine STRAW (A) YELLOW   APPearance CLEAR CLEAR   Specific Gravity, Urine 1.008 1.005 - 1.030  pH 5.0 5.0 - 8.0   Glucose, UA NEGATIVE NEGATIVE mg/dL   Hgb urine dipstick SMALL (A) NEGATIVE   Bilirubin Urine NEGATIVE NEGATIVE   Ketones, ur NEGATIVE NEGATIVE mg/dL   Protein, ur 100 (A) NEGATIVE mg/dL   Nitrite NEGATIVE NEGATIVE   Leukocytes,Ua NEGATIVE NEGATIVE   RBC / HPF 0-5 0 - 5 RBC/hpf   WBC, UA 0-5 0 - 5 WBC/hpf   Bacteria, UA NONE SEEN NONE SEEN   Squamous Epithelial / LPF 0-5 0 - 5   Mucus PRESENT     Comment: Performed at [facility], [place] [address] [place], [place] [address]  Protein / creatinine ratio, urine     Status: Abnormal   Collection Time: 03/10/21  6:23 AM  Result Value Ref Range   Creatinine, Urine 27.64 mg/dL   Total Protein, Urine 121  mg/dL    Comment: NO NORMAL RANGE ESTABLISHED FOR THIS TEST   Protein Creatinine Ratio 4.38 (H) 0.00 - 0.15 mg/mg[Cre]    Comment: Performed at [facility] [address] [place],  [address]  CBG monitoring, ED     Status: Abnormal   Collection Time: 03/10/21  8:48 AM  Result Value Ref Range   Glucose-Capillary 202 (H) 70 - 99 mg/dL    Comment: Glucose reference range applies only to samples taken after fasting for at least 8 hours.  CBG monitoring, ED     Status: Abnormal   Collection Time: 03/10/21  1:22 PM  Result Value Ref Range   Glucose-Capillary 197 (H) 70 - 99 mg/dL    Comment: Glucose reference range applies only to samples taken after fasting for at least 8 hours.  Glucose, capillary     Status: Abnormal   Collection Time: 03/10/21  4:08 PM  Result Value Ref Range   Glucose-Capillary 198 (H) 70 - 99 mg/dL    Comment: Glucose reference range applies only to samples taken after fasting for at least 8 hours.  Glucose, capillary     Status: Abnormal   Collection Time: 03/10/21  9:16 PM  Result Value Ref Range   Glucose-Capillary 158 (H) 70 - 99 mg/dL    Comment: Glucose reference range applies only to samples taken after fasting for at least 8 hours.  Glucose, capillary     Status: Abnormal   Collection Time: 03/11/21  6:00 AM  Result Value Ref Range   Glucose-Capillary 139 (H) 70 - 99 mg/dL    Comment: Glucose reference range applies only to samples taken after fasting for at least 8 hours.  Renal function panel     Status: Abnormal   Collection Time: 03/11/21  6:39 AM  Result Value Ref Range   Sodium 141 135 - 145 mmol/L   Potassium 2.7 (LL) 3.5 - 5.1 mmol/L    Comment: CRITICAL RESULT CALLED TO, READ BACK BY AND VERIFIED WITH: S.TIJANI RN [id number] 03/11/21 MCCORMICK K    Chloride 102 98 - 111 mmol/L   CO2 23 22 - 32 mmol/L   Glucose, Bld 141 (H) 70 - 99 mg/dL    Comment: Glucose reference range applies only to samples taken after fasting for at least 8  hours.   BUN 92 (H) 6 - 20 mg/dL   Creatinine, Ser 5.07 (H) 0.61 - 1.24 mg/dL   Calcium 9.5 8.9 - 10.3 mg/dL   Phosphorus 4.1 2.5 - 4.6 mg/dL   Albumin 3.1 (L) 3.5 - 5.0 g/dL   GFR, Estimated 12 (L) >60 mL/min  Comment: (NOTE) Calculated using the CKD-EPI Creatinine Equation (2021)    Anion gap 16 (H) 5 - 15    Comment: Performed at [facility], [place] [address], [place], [place] [address]  Magnesium     Status: None   Collection Time: 03/11/21  6:39 AM  Result Value Ref Range   Magnesium 2.1 1.7 - 2.4 mg/dL    Comment: Performed at [facility] [address], [place], [place] [address]   ROS:  Pertinent items noted in HPI and remainder of comprehensive ROS otherwise negative.  Physical Exam: Vitals:   03/11/21 0826 03/11/21 0930  BP: 131/64   Pulse: 69 68  Resp: 16   Temp: 98.4 F (36.9 C)   SpO2: 100%      General exam: Appears calm and comfortable  Gastrointestinal system: Abdomen is nondistended, soft and nontender. Normal bowel sounds heard. Central nervous system: Alert and oriented. No focal neurological deficits. Extremities: trace BLE edema to mid calf Skin: No rashes, lesions or ulcers Psychiatry: Judgement and insight appear normal. Mood & affect appropriate.   Assessment/Plan: 1.Renal- Creatinine has bumped from Lasix 80 IV twice daily,but with excellent UOP. No indications for urgent dialysis at this time. Switch to po torsemide 40 mg BID with metolazone PRN. Okay to discharge from renal standpoint with outpatient follow up with [name] Friday or Monday.  2. Hypertension/volume  - Acceptable from renal stand point. Maintain MAP >65 for real perfusion.  3. Hypokalemia - K 2.7 this AM.  Patient scheduled for potassium chloride 20 mEq by primary team.  4. Anemia  - Hgb 11.0 on admission, at goal. Transfusion threshold <7 with consideration for volume status. 5. Acute CHF exacerbation - per primary team   Ezequiel Essex, MD [facility]  PGY-2 Nephrology Service 03/11/2021, 10:28 AM  [facility]

## 2021-03-12 ENCOUNTER — Telehealth: Payer: Self-pay

## 2021-03-12 NOTE — Telephone Encounter (Addendum)
Transition Care Management Unsuccessful Follow-up Telephone Call  Date of discharge and from where:  [place] 03-11-21 Dx: acute CHF  Attempts:  1st Attempt  Reason for unsuccessful TCM follow-up call:  Left voice message  Transition Care Management Unsuccessful Follow-up Telephone Call  Date of discharge and from where:  [place] 03-11-21 Dx: acute CHF  Attempts:  2nd Attempt  Reason for unsuccessful TCM follow-up call:  Left voice message  Transition Care Management Unsuccessful Follow-up Telephone Call  Date of discharge and from where:  [place] 03-11-21 Dx: acute CHF  Attempts:  3rd Attempt  Reason for unsuccessful TCM follow-up call:  Left voice message

## 2021-03-12 NOTE — Telephone Encounter (Incomplete Revision)
Transition Care Management Unsuccessful Follow-up Telephone Call  Date of discharge and from where:  [place] 03-11-21 Dx: acute CHF  Attempts:  1st Attempt  Reason for unsuccessful TCM follow-up call:  Left voice message  Transition Care Management Unsuccessful Follow-up Telephone Call  Date of discharge and from where:  [place] 03-11-21 Dx: acute CHF  Attempts:  2nd Attempt  Reason for unsuccessful TCM follow-up call:  Left voice message

## 2021-03-13 ENCOUNTER — Telehealth: Payer: Self-pay

## 2021-03-13 NOTE — Telephone Encounter (Signed)
Spoke to pt to see how he is doing after recent ER-Hospital Admission. He said he is feeling better. The procedure helped. Wanted Dr Silvio Pate to know he is meeting with the nephrologist, Dr [name] to discuss starting dialysis.

## 2021-03-13 NOTE — Telephone Encounter (Signed)
Glad to hear he is doing better. I thought he was getting close to needing dialysis. Will await further evaluation for this

## 2021-03-19 ENCOUNTER — Other Ambulatory Visit: Payer: Self-pay

## 2021-03-19 ENCOUNTER — Ambulatory Visit: Payer: 59 | Admitting: Podiatry

## 2021-03-19 DIAGNOSIS — L97522 Non-pressure chronic ulcer of other part of left foot with fat layer exposed: Secondary | ICD-10-CM

## 2021-03-19 DIAGNOSIS — E0843 Diabetes mellitus due to underlying condition with diabetic autonomic (poly)neuropathy: Secondary | ICD-10-CM | POA: Diagnosis not present

## 2021-03-19 NOTE — Progress Notes (Signed)
° °  HPI: 61 y.o. 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  Patient states that he completed the antibiotics.  He had been applying the antibiotic cream up until a few days ago because he believes the wound is healed.  He says that the offloading felt dancers pads help significantly reduce pressure from the area he presents for further treatment and evaluation  Past Medical History:  Diagnosis Date   Allergy    Anemia    Chronic venous insufficiency    CKD (chronic kidney disease), stage III ([place])    Diabetes mellitus type II 2003   HLD (hyperlipidemia)    HTN (hypertension) 1980's   Neuromuscular disorder (HCC)    neuropathy feet    OSA (obstructive sleep apnea)    Pericardial effusion    S/P pericardial window 01/16/2019   Sleep apnea    wear cpap     Past Surgical History:  Procedure Laterality Date   "Bone removal" from back  [place] N/A 02/12/2015   Procedure: Left Heart Cath and Coronary Angiography;  Surgeon: Adrian Prows, MD;  Location: [facility];  Service: Cardiovascular;  Laterality: N/A;   COLONOSCOPY     KNEE CARTILAGE SURGERY     left knee   LUMBAR [place]   PERICARDIOCENTESIS N/A 01/16/2019   Procedure: PERICARDIOCENTESIS;  Surgeon: Troy Sine, MD;  Location: [facility];  Service: Cardiovascular;  Laterality: N/A;   POLYPECTOMY     VIDEO ASSISTED THORACOSCOPY Right 01/17/2019   Procedure: VIDEO ASSISTED THORACOSCOPY, pericardial window for pericardium and pericardial fluid.;  Surgeon: Lajuana Matte, MD;  Location: [facility];  Service: Thoracic;  Laterality: Right;    No Known Allergies   Physical Exam: General: The patient is alert and oriented x3 in no acute distress.  Dermatology: Skin is warm, dry and supple bilateral lower extremities.  There is some callus tissue around the freshly healed wound.  There is no  open wound noted today.  After debridement of the callus there is freshly healed complete reepithelialization of the wound.  Vascular: Palpable pedal pulses bilaterally. Capillary refill within normal limits.  Negative for any significant edema or erythema  Neurological: Light touch and protective threshold diminished  Musculoskeletal Exam: No pedal deformities noted   Assessment: 1.  Ulcer left foot secondary to diabetes mellitus; healed   Plan of Care:  1. Patient evaluated.   2.  Light debridement of the callus area was performed using a 312 scalpel without incident or bleeding 3.  Recommend daily foot lotion 4.  Advised against going barefoot.  Continue good supportive shoes with offloading felt dancers pads 5.  Return to clinic in 3 months for routine foot care      Edrick Kins, DPM [facility]  Dr. Edrick Kins, DPM    [address] [place], [place] [address]                Office [phone]  Fax [phone]

## 2021-03-21 ENCOUNTER — Other Ambulatory Visit: Payer: Self-pay | Admitting: Internal Medicine

## 2021-03-31 ENCOUNTER — Ambulatory Visit (INDEPENDENT_AMBULATORY_CARE_PROVIDER_SITE_OTHER)
Admission: RE | Admit: 2021-03-31 | Discharge: 2021-03-31 | Disposition: A | Discharge status: Home / Self Care | Payer: 59 | Source: Ambulatory Visit | Attending: Surgery | Admitting: Surgery

## 2021-03-31 ENCOUNTER — Other Ambulatory Visit: Payer: Self-pay

## 2021-03-31 ENCOUNTER — Ambulatory Visit (HOSPITAL_COMMUNITY)
Admission: RE | Admit: 2021-03-31 | Discharge: 2021-03-31 | Disposition: A | Discharge status: Home / Self Care | Payer: 59 | Source: Ambulatory Visit | Attending: Surgery | Admitting: Surgery

## 2021-03-31 ENCOUNTER — Encounter: Payer: Self-pay | Admitting: Surgery

## 2021-03-31 ENCOUNTER — Ambulatory Visit: Payer: 59 | Admitting: Surgery

## 2021-03-31 VITALS — BP 134/69 | HR 63 | Temp 97.6°F | Resp 20 | Ht 72.0 in | Wt 258.0 lb

## 2021-03-31 DIAGNOSIS — N185 Chronic kidney disease, stage 5: Secondary | ICD-10-CM
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
## 2021-04-01 ENCOUNTER — Other Ambulatory Visit: Payer: Self-pay

## 2021-04-01 ENCOUNTER — Encounter (HOSPITAL_COMMUNITY): Payer: Self-pay | Admitting: Surgery

## 2021-04-01 ENCOUNTER — Telehealth: Payer: Self-pay

## 2021-04-01 DIAGNOSIS — E1142 Type 2 diabetes mellitus with diabetic polyneuropathy: Secondary | ICD-10-CM

## 2021-04-01 MED ORDER — LANTUS SOLOSTAR 100 UNIT/ML ~~LOC~~ SOPN: 65.0000 [IU] | PEN_INJECTOR | Freq: Every day | SUBCUTANEOUS | 0 refills | Status: DC

## 2021-04-01 NOTE — Telephone Encounter (Signed)
New rx sent to preferred pharmacy. °

## 2021-04-01 NOTE — Telephone Encounter (Signed)
Ok to send it with 65 units daily. ?

## 2021-04-01 NOTE — Telephone Encounter (Signed)
Pt advised he has increased his Lantus back to 65 units daily after his discharge from the hospital. Per pt the decreased it to 28 units daily due to his A1C but since he has been home his sugars have increased and he has started back taking the 65 units daily. Pt request a new Rx be sent in with increased dose. ?

## 2021-04-01 NOTE — Progress Notes (Signed)
Mr. Samuel Reed denies chest pain or shortness of breath.Patient denies having any s/s of Covid in his household.  Patient denies any known exposure to Covid.  ? ?Mr. Samuel Reed has diabetes type II  I instructed patient to take 27 units of Lantus tonight I instructed patient to check CBG after awaking and every 2 hours until arrival  to the hospital.  ?I Instructed patient if CBG is less than 70 to take 4 Glucose Tablets or 1 tube of Glucose Gel or 1/2 cup of a clear juice. Recheck CBG in 15 minutes if CBG is not over 70 call, pre- op desk at [phone] for further instructions. Humolog Insulin is for meal coverage for Mr. Samuel Reed.  Patient has a [employer] 2 on his left arm. ? ?Mr. Samuel Reed Drs.: ? ?PCP is Dr. Waunita Schooner, ?Cardiologist is [name], ?Endocrinologist is Dr. Cruzita Lederer, ?Nephrologist is [name]. ? ?I instructed Mr. Samuel Reed to shower with antibacteria soap.  No nail polish, artificial or acrylic nails. Wear clean clothes, brush your teeth. ?Glasses, contact lens,dentures or partials may not be worn in the OR. If you need to wear them, please bring a case for glasses, do not wear contacts or bring a case, the hospital does not have contact cases, dentures or partials will have to be removed , make sure they are clean, we will provide a denture cup to put them in. You will need some one to drive you home and a responsible person over the age of 65 to stay with you for the first 24 hours after surgery.  ? ?

## 2021-04-02 ENCOUNTER — Encounter (HOSPITAL_COMMUNITY): Payer: Self-pay | Admitting: Surgery

## 2021-04-02 ENCOUNTER — Ambulatory Visit (HOSPITAL_COMMUNITY): Payer: 59 | Admitting: Certified Registered Nurse Anesthetist

## 2021-04-02 ENCOUNTER — Other Ambulatory Visit: Payer: Self-pay

## 2021-04-02 ENCOUNTER — Encounter (HOSPITAL_COMMUNITY)
Admission: RE | Disposition: A | Discharge status: Home / Self Care | Payer: Self-pay | Source: Home / Self Care | Attending: Surgery

## 2021-04-02 ENCOUNTER — Ambulatory Visit (HOSPITAL_COMMUNITY)
Admission: RE | Admit: 2021-04-02 | Discharge: 2021-04-02 | Disposition: A | Discharge status: Home / Self Care | Payer: 59 | Attending: Surgery | Admitting: Surgery

## 2021-04-02 ENCOUNTER — Ambulatory Visit (HOSPITAL_BASED_OUTPATIENT_CLINIC_OR_DEPARTMENT_OTHER): Payer: 59 | Admitting: Certified Registered Nurse Anesthetist

## 2021-04-02 DIAGNOSIS — Z992 Dependence on renal dialysis: Secondary | ICD-10-CM | POA: Insufficient documentation

## 2021-04-02 DIAGNOSIS — I509 Heart failure, unspecified: Secondary | ICD-10-CM | POA: Diagnosis not present

## 2021-04-02 DIAGNOSIS — Z87891 Personal history of nicotine dependence: Secondary | ICD-10-CM | POA: Diagnosis not present

## 2021-04-02 DIAGNOSIS — N185 Chronic kidney disease, stage 5: Secondary | ICD-10-CM

## 2021-04-02 DIAGNOSIS — Z79899 Other long term (current) drug therapy: Secondary | ICD-10-CM | POA: Diagnosis not present

## 2021-04-02 DIAGNOSIS — E1122 Type 2 diabetes mellitus with diabetic chronic kidney disease: Secondary | ICD-10-CM | POA: Diagnosis not present

## 2021-04-02 DIAGNOSIS — G4733 Obstructive sleep apnea (adult) (pediatric): Secondary | ICD-10-CM | POA: Insufficient documentation

## 2021-04-02 DIAGNOSIS — E7800 Pure hypercholesterolemia, unspecified: Secondary | ICD-10-CM | POA: Diagnosis not present

## 2021-04-02 DIAGNOSIS — I132 Hypertensive heart and chronic kidney disease with heart failure and with stage 5 chronic kidney disease, or end stage renal disease: Secondary | ICD-10-CM | POA: Diagnosis not present

## 2021-04-02 HISTORY — DX: Other complications of anesthesia, initial encounter: T88.59XA

## 2021-04-02 HISTORY — PX: CAPD INSERTION: SHX5233

## 2021-04-02 HISTORY — PX: AV FISTULA PLACEMENT: SHX1204

## 2021-04-02 HISTORY — DX: Heart failure, unspecified: I50.9

## 2021-04-02 LAB — POCT I-STAT, CHEM 8
BUN: >130 mg/dL — ABNORMAL HIGH (ref 6–20)
Calcium, Ion: 1.07 mmol/L — ABNORMAL LOW (ref 1.15–1.40)
Chloride: 97 mmol/L — ABNORMAL LOW (ref 98–111)
Creatinine, Ser: 6.5 mg/dL — ABNORMAL HIGH (ref 0.61–1.24)
Glucose, Bld: 161 mg/dL — ABNORMAL HIGH (ref 70–99)
HCT: 35 % — ABNORMAL LOW (ref 39.0–52.0)
Hemoglobin: 11.9 g/dL — ABNORMAL LOW (ref 13.0–17.0)
Potassium: 3.3 mmol/L — ABNORMAL LOW (ref 3.5–5.1)
Sodium: 142 mmol/L (ref 135–145)
TCO2: 32 mmol/L (ref 22–32)

## 2021-04-02 LAB — GLUCOSE, CAPILLARY
Glucose-Capillary: 161 mg/dL — ABNORMAL HIGH (ref 70–99)
Glucose-Capillary: 159 mg/dL — ABNORMAL HIGH (ref 70–99)
Glucose-Capillary: 136 mg/dL — ABNORMAL HIGH (ref 70–99)
Glucose-Capillary: 146 mg/dL — ABNORMAL HIGH (ref 70–99)

## 2021-04-02 SURGERY — LAPAROSCOPIC INSERTION CONTINUOUS AMBULATORY PERITONEAL DIALYSIS  (CAPD) CATHETER
Anesthesia: General | Site: Arm Lower | Laterality: Right

## 2021-04-02 MED ORDER — ROCURONIUM BROMIDE 10 MG/ML (PF) SYRINGE
PREFILLED_SYRINGE | INTRAVENOUS | Status: DC | PRN
  Administered 2021-04-02 (×2): 20 mg via INTRAVENOUS
  Administered 2021-04-02: 50 mg via INTRAVENOUS
  Administered 2021-04-02: 30 mg via INTRAVENOUS

## 2021-04-02 MED ORDER — PHENYLEPHRINE HCL-NACL 20-0.9 MG/250ML-% IV SOLN
INTRAVENOUS | Status: DC | PRN
  Administered 2021-04-02: 20 ug/min via INTRAVENOUS

## 2021-04-02 MED ORDER — CHLORHEXIDINE GLUCONATE 4 % EX LIQD: 60.0000 mL | Freq: Once | CUTANEOUS | Status: DC

## 2021-04-02 MED ORDER — SODIUM CHLORIDE 0.9 % IR SOLN
Status: DC | PRN
  Administered 2021-04-02: 1

## 2021-04-02 MED ORDER — FENTANYL CITRATE (PF) 100 MCG/2ML IJ SOLN: 25.0000 ug | INTRAMUSCULAR | Status: DC | PRN

## 2021-04-02 MED ORDER — SODIUM CHLORIDE 0.9 % IV SOLN: INTRAVENOUS | Status: DC

## 2021-04-02 MED ORDER — FENTANYL CITRATE (PF) 250 MCG/5ML IJ SOLN
INTRAMUSCULAR | Status: AC
  Filled 2021-04-02: qty 5

## 2021-04-02 MED ORDER — CEFAZOLIN SODIUM-DEXTROSE 2-4 GM/100ML-% IV SOLN
2.0000 g | INTRAVENOUS | Status: AC
  Administered 2021-04-02: 2 g via INTRAVENOUS
  Filled 2021-04-02: qty 100

## 2021-04-02 MED ORDER — LIDOCAINE-EPINEPHRINE (PF) 1 %-1:200000 IJ SOLN
INTRAMUSCULAR | Status: DC | PRN
  Administered 2021-04-02: 5 mL

## 2021-04-02 MED ORDER — MIDAZOLAM HCL 2 MG/2ML IJ SOLN
INTRAMUSCULAR | Status: AC
  Filled 2021-04-02: qty 2

## 2021-04-02 MED ORDER — SUCCINYLCHOLINE CHLORIDE 200 MG/10ML IV SOSY
PREFILLED_SYRINGE | INTRAVENOUS | Status: DC | PRN
Start: 2021-04-02 — End: 2021-04-02
  Administered 2021-04-02: 160 mg via INTRAVENOUS

## 2021-04-02 MED ORDER — SUGAMMADEX SODIUM 200 MG/2ML IV SOLN
INTRAVENOUS | Status: DC | PRN
  Administered 2021-04-02: 300 mg via INTRAVENOUS

## 2021-04-02 MED ORDER — LIDOCAINE 2% (20 MG/ML) 5 ML SYRINGE
INTRAMUSCULAR | Status: DC | PRN
  Administered 2021-04-02: 100 mg via INTRAVENOUS

## 2021-04-02 MED ORDER — INSULIN ASPART 100 UNIT/ML IJ SOLN
INTRAMUSCULAR | Status: AC
  Administered 2021-04-02: 2 [IU] via SUBCUTANEOUS
  Filled 2021-04-02: qty 1

## 2021-04-02 MED ORDER — CHLORHEXIDINE GLUCONATE 0.12 % MT SOLN
15.0000 mL | Freq: Once | OROMUCOSAL | Status: AC
  Administered 2021-04-02: 15 mL via OROMUCOSAL
  Filled 2021-04-02: qty 15

## 2021-04-02 MED ORDER — BUPIVACAINE HCL (PF) 0.25 % IJ SOLN
INTRAMUSCULAR | Status: AC
  Filled 2021-04-02: qty 30

## 2021-04-02 MED ORDER — HEPARIN 6000 UNIT IRRIGATION SOLUTION
Status: DC | PRN
  Administered 2021-04-02: 1

## 2021-04-02 MED ORDER — MIDAZOLAM HCL 2 MG/2ML IJ SOLN
INTRAMUSCULAR | Status: DC | PRN
  Administered 2021-04-02: 1 mg via INTRAVENOUS

## 2021-04-02 MED ORDER — ONDANSETRON HCL 4 MG/2ML IJ SOLN
INTRAMUSCULAR | Status: DC | PRN
  Administered 2021-04-02: 4 mg via INTRAVENOUS

## 2021-04-02 MED ORDER — LIDOCAINE-EPINEPHRINE (PF) 1 %-1:200000 IJ SOLN
INTRAMUSCULAR | Status: AC
  Filled 2021-04-02: qty 30

## 2021-04-02 MED ORDER — INSULIN ASPART 100 UNIT/ML IJ SOLN
0.0000 [IU] | INTRAMUSCULAR | Status: AC | PRN
  Administered 2021-04-02: 2 [IU] via SUBCUTANEOUS
  Filled 2021-04-02: qty 1

## 2021-04-02 MED ORDER — 0.9 % SODIUM CHLORIDE (POUR BTL) OPTIME
TOPICAL | Status: DC | PRN
  Administered 2021-04-02: 1000 mL

## 2021-04-02 MED ORDER — FENTANYL CITRATE (PF) 250 MCG/5ML IJ SOLN
INTRAMUSCULAR | Status: DC | PRN
  Administered 2021-04-02: 25 ug via INTRAVENOUS
  Administered 2021-04-02 (×2): 50 ug via INTRAVENOUS

## 2021-04-02 MED ORDER — HEPARIN 6000 UNIT IRRIGATION SOLUTION
Status: AC
  Filled 2021-04-02: qty 500

## 2021-04-02 MED ORDER — OXYCODONE-ACETAMINOPHEN 5-325 MG PO TABS: 1.0000 | ORAL_TABLET | Freq: Four times a day (QID) | ORAL | 0 refills | Status: DC | PRN

## 2021-04-02 MED ORDER — DEXAMETHASONE SODIUM PHOSPHATE 10 MG/ML IJ SOLN
INTRAMUSCULAR | Status: DC | PRN
  Administered 2021-04-02: 5 mg via INTRAVENOUS

## 2021-04-02 MED ORDER — ORAL CARE MOUTH RINSE: 15.0000 mL | Freq: Once | OROMUCOSAL | Status: AC

## 2021-04-02 MED ORDER — ETOMIDATE 2 MG/ML IV SOLN
INTRAVENOUS | Status: DC | PRN
  Administered 2021-04-02: 12 mg via INTRAVENOUS

## 2021-04-02 SURGICAL SUPPLY — 75 items
ADAPTER TITANIUM MEDIONICS (MISCELLANEOUS) ×4 IMPLANT
ADH SKN CLS APL DERMABOND .7 (GAUZE/BANDAGES/DRESSINGS) ×4
ADPR DLYS CATH STRL LF DISP (MISCELLANEOUS) ×2
APL PRP STRL LF DISP 70% ISPRP (MISCELLANEOUS) ×4
APPLIER CLIP 5 13 M/L LIGAMAX5 (MISCELLANEOUS)
APR CLP MED LRG 5 ANG JAW (MISCELLANEOUS)
ARMBAND PINK RESTRICT EXTREMIT (MISCELLANEOUS) ×8 IMPLANT
BAG COUNTER SPONGE SURGICOUNT (BAG) ×4 IMPLANT
BAG DECANTER FOR FLEXI CONT (MISCELLANEOUS) ×4 IMPLANT
BAG SPNG CNTER NS LX DISP (BAG) ×2
BIOPATCH RED 1 DISK 7.0 (GAUZE/BANDAGES/DRESSINGS) ×4 IMPLANT
BLADE 11 SAFETY STRL DISP (BLADE) ×4 IMPLANT
BLADE CLIPPER SURG (BLADE) IMPLANT
CANISTER SUCT 3000ML PPV (MISCELLANEOUS) ×4 IMPLANT
CATH EXTENDED DIALYSIS (CATHETERS) ×4 IMPLANT
CHLORAPREP W/TINT 26 (MISCELLANEOUS) ×5 IMPLANT
CLIP APPLIE 5 13 M/L LIGAMAX5 (MISCELLANEOUS) IMPLANT
CLIP VESOCCLUDE MED 6/CT (CLIP) ×4 IMPLANT
CLIP VESOCCLUDE SM WIDE 6/CT (CLIP) ×4 IMPLANT
COVER PROBE W GEL 5X96 (DRAPES) ×4 IMPLANT
COVER SURGICAL LIGHT HANDLE (MISCELLANEOUS) ×4 IMPLANT
DERMABOND ADVANCED (GAUZE/BANDAGES/DRESSINGS) ×2
DERMABOND ADVANCED .7 DNX12 (GAUZE/BANDAGES/DRESSINGS) ×3 IMPLANT
DEVICE TROCAR PUNCTURE CLOSURE (ENDOMECHANICALS) ×4 IMPLANT
DRSG TEGADERM 4X4.75 (GAUZE/BANDAGES/DRESSINGS) ×15 IMPLANT
ELECT REM PT RETURN 9FT ADLT (ELECTROSURGICAL) ×3
ELECTRODE REM PT RTRN 9FT ADLT (ELECTROSURGICAL) ×3 IMPLANT
GAUZE SPONGE 4X4 12PLY STRL (GAUZE/BANDAGES/DRESSINGS) ×5 IMPLANT
GLOVE SRG 8 PF TXTR STRL LF DI (GLOVE) ×3 IMPLANT
GLOVE SURG POLYISO LF SZ7.5 (GLOVE) ×4 IMPLANT
GLOVE SURG UNDER POLY LF SZ8 (GLOVE) ×3
GOWN STRL REUS W/ TWL LRG LVL3 (GOWN DISPOSABLE) ×6 IMPLANT
GOWN STRL REUS W/ TWL XL LVL3 (GOWN DISPOSABLE) ×3 IMPLANT
GOWN STRL REUS W/TWL LRG LVL3 (GOWN DISPOSABLE) ×6
GOWN STRL REUS W/TWL XL LVL3 (GOWN DISPOSABLE) ×3
GRASPER SUT TROCAR 14GX15 (MISCELLANEOUS) ×4 IMPLANT
HEMOSTAT SNOW SURGICEL 2X4 (HEMOSTASIS) IMPLANT
IV NS 1000ML (IV SOLUTION) ×3
IV NS 1000ML BAXH (IV SOLUTION) ×3 IMPLANT
KIT BASIN OR (CUSTOM PROCEDURE TRAY) ×4 IMPLANT
KIT TURNOVER KIT B (KITS) ×4 IMPLANT
NDL INSUFFLATION 14GA 120MM (NEEDLE) IMPLANT
NEEDLE INSUFFLATION 14GA 120MM (NEEDLE) IMPLANT
NS IRRIG 1000ML POUR BTL (IV SOLUTION) ×4 IMPLANT
PACK CV ACCESS (CUSTOM PROCEDURE TRAY) ×4 IMPLANT
PAD ARMBOARD 7.5X6 YLW CONV (MISCELLANEOUS) ×8 IMPLANT
SET CYSTO W/LG BORE CLAMP LF (SET/KITS/TRAYS/PACK) ×4 IMPLANT
SET EXT 12IN DIALYSIS STAY-SAF (MISCELLANEOUS) ×4 IMPLANT
SET IRRIG TUBING LAPAROSCOPIC (IRRIGATION / IRRIGATOR) IMPLANT
SET TUBE SMOKE EVAC HIGH FLOW (TUBING) ×4 IMPLANT
SLEEVE ENDOPATH XCEL 5M (ENDOMECHANICALS) ×7 IMPLANT
SLING ARM FOAM STRAP LRG (SOFTGOODS) IMPLANT
SLING ARM FOAM STRAP MED (SOFTGOODS) IMPLANT
SOL ANTI FOG 6CC (MISCELLANEOUS) IMPLANT
SOLUTION ANTI FOG 6CC (MISCELLANEOUS) ×1
STYLET FALLER (MISCELLANEOUS) ×4 IMPLANT
STYLET FALLER MEDIONICS (MISCELLANEOUS) ×4 IMPLANT
SUT MNCRL AB 4-0 PS2 18 (SUTURE) ×9 IMPLANT
SUT PROLENE 0 SH 30 (SUTURE) ×8 IMPLANT
SUT PROLENE 6 0 CC (SUTURE) ×4 IMPLANT
SUT SILK 0 TIES 10X30 (SUTURE) ×4 IMPLANT
SUT VIC AB 3-0 SH 27 (SUTURE) ×3
SUT VIC AB 3-0 SH 27X BRD (SUTURE) ×3 IMPLANT
SUT VIC AB 3-0 X1 27 (SUTURE) ×2 IMPLANT
SUT VICRYL 3 0 (SUTURE) IMPLANT
SUT VICRYL 4-0 PS2 18IN ABS (SUTURE) ×4 IMPLANT
TOWEL GREEN STERILE (TOWEL DISPOSABLE) ×4 IMPLANT
TOWEL GREEN STERILE FF (TOWEL DISPOSABLE) ×4 IMPLANT
TRAY LAPAROSCOPIC MC (CUSTOM PROCEDURE TRAY) ×4 IMPLANT
TROCAR 5MMX150MM (TROCAR) IMPLANT
TROCAR XCEL BLADELESS 5X75MML (TROCAR) ×4 IMPLANT
TROCAR XCEL NON-BLD 11X100MML (ENDOMECHANICALS) ×1 IMPLANT
TROCAR XCEL NON-BLD 5MMX100MML (ENDOMECHANICALS) ×5 IMPLANT
UNDERPAD 30X36 HEAVY ABSORB (UNDERPADS AND DIAPERS) ×4 IMPLANT
WATER STERILE IRR 1000ML POUR (IV SOLUTION) ×4 IMPLANT

## 2021-04-02 NOTE — Op Note (Signed)
? ? ?Patient name: Samuel Reed MRN: [id number] DOB: [birth year] Sex: male ? ?04/02/2021 ?Pre-operative Diagnosis: Stage V renal insufficiency ?Post-operative diagnosis:  Same ?Surgeon:  Annamarie Major ?Assistants:  Laurence Slate, PA ?Procedure:   #1: Right radiocephalic fistula. ?  #2: Laparoscopic insertion of peritoneal dialysis catheter with omentopexy ?Anesthesia:  General ?Blood Loss:  minimal ?Specimens:  none ? ?Findings: 3 mm cephalic vein.  Healthy radial artery ? ?Indications: It is been recommended that the patient receive a fistula and peritoneal dialysis catheter.  He is left-handed.  Preoperative explanation of the procedure were discussed with the patient he wished to proceed. ? ?Procedure:  The patient was identified in the holding area and taken to [place] 16  The patient was then placed supine on the table. general anesthesia was administered.  The patient was prepped and draped in the usual sterile fashion.  A time out was called and antibiotics were administered.  A PA was necessary to expedite the procedure and assist with technical details.  The PA performed retraction and suction to help with the fistula as well as wound closure.  She also assisted with camera manipulating during the laparoscopic portion. ? ?The right cephalic vein was evaluated with ultrasound.  It appeared to be of adequate caliber for fistula creation throughout the arm.  I therefore elected to proceed with a radiocephalic fistula.  A transverse incision was made just proximal to the wrist.  Cautery was used divide subcutaneous tissue.  I first dissected out the radial artery which was a disease-free 3 mm artery.  It was encircled with Vesseloops.  I then identified the cephalic vein and mobilized it throughout the length of the incision.  Multiple side branches were ligated.  The vein was then marked for orientation and ligated distally.  It distended nicely with heparin saline.  Next the radial artery was occluded with  vascular clamps and a #11 blade was used to make an arteriotomy which was extended longitudinally with Potts scissors.  The vein was then cut to the appropriate length and spatulated to fit the size the arteriotomy.  A running anastomosis was created with 6-0 Prolene.  Blood flow was then reestablished.  There was an excellent thrill within the fistula.  I inspected the course of the cephalic vein to make sure there were no kinks or additional branches.  The patient had a palpable radial pulse and a good thrill within the fistula.  The wound was irrigated.  The deep tissue was closed with 3-0 Vicryl followed by subcuticular closure and Dermabond. ? ?The patient's abdomen was then prepped and draped in usual sterile fashion.  A right subcostal incision was made with a #11 blade.  Using a 5 mm trocar with Optiview, the abdomen was entered.  It was then insufflated with CO2.  I then selected a right sided port at the level of the umbilicus.  This was placed under direct vision.  It was a 5 mm port.  I then measured the peritoneal dialysis catheter from the top of the curve at the pubic symphysis up to the umbilicus.  I then made a paramedian incision above the umbilicus.  An 11 mm port was then placed through this and then beveled along the rectus sheath and then entered at an angle into the pelvis.  The peritoneal catheter was then inserted through the port.  The cuff was placed at the fascial edge, making sure it was not in the abdomen.  The cannula was then  removed.  I flossed the catheter in and out and placed a cuff at the anterior surface of the posterior sheath.  I then made a second subxiphoid incision.  I connected the peritoneal catheter to the metal portion and sutured this with Prolene.  Using the tunneler, the catheter was brought up to the other incision.  I then used the tunneler to create a exit site several fingerbreadths below the costal margin on the left.  The catheter was then connected to a saline  bag and under gravity, 1 L saline was infiltrated into the abdomen which flowed freely.  The saline bag was placed on the floor and there was good gravity drainage.  I then made another small incision in the midline and used this for placement of the omentopexy.  The needle was inserted through the fascia under direct vision and then through the omentum.  A Prolene tie was brought through this to grab the omentum and tack it up to the abdominal wall.  I then inspected the abdomen with the camera.  There was no obvious complications.  The CO2 was removed from the abdomen.  The incisions were then closed with Vicryl and Dermabond.  There were no immediate complications.  He was successfully extubated. ? ? ?Disposition: To PACU stable. ? ? ?V. Annamarie Major, M.D., FACS ?[facility] ?Office: [phone] ?Pager:  [phone]  ?

## 2021-04-02 NOTE — Anesthesia Postprocedure Evaluation (Signed)
Anesthesia Post Note ? ?[name] ? ?Procedure(s) Performed: LAPAROSCOPIC INSERTION CONTINUOUS AMBULATORY PERITONEAL DIALYSIS  (CAPD) CATHETER ?RIGHT RADIOCEPHALIC  ARTERIOVENOUS (AV) FISTULA CREATION (Right: Arm Lower) ? ?  ? ?Patient location during evaluation: PACU ?Anesthesia Type: General ?Level of consciousness: awake ?Pain management: pain level controlled ?Vital Signs Assessment: post-procedure vital signs reviewed and stable ?Respiratory status: spontaneous breathing ?Cardiovascular status: stable ?Postop Assessment: no apparent nausea or vomiting ?Anesthetic complications: no ? ? ?No notable events documented. ? ?Last Vitals:  ?Vitals:  ? 04/02/21 1500 04/02/21 1515  ?BP: 134/60 (!) 126/56  ?Pulse: (!) 54 (!) 52  ?Resp: 16 11  ?Temp:  36.4 ?C  ?SpO2: 97% 96%  ?  ?Last Pain:  ?Vitals:  ? 04/02/21 1515  ?TempSrc:   ?PainSc: 0-No pain  ? ? ?  ?  ?  ?  ?  ?  ? ?[name] ? ? ? ? ?

## 2021-04-02 NOTE — Anesthesia Preprocedure Evaluation (Signed)
Anesthesia Evaluation  ?Patient identified by MRN, date of birth, ID band ?Patient awake ? ? ? ?Reviewed: ?Allergy & Precautions, NPO status , Patient's Chart, lab work & pertinent test results ? ?Airway ?Mallampati: II ? ?TM Distance: >3 FB ? ? ? ? Dental ?  ?Pulmonary ?sleep apnea , former smoker,  ?  ?breath sounds clear to auscultation ? ? ? ? ? ? Cardiovascular ?hypertension, +CHF  ? ?Rhythm:Regular Rate:Normal ? ? ?  ?Neuro/Psych ?  ? GI/Hepatic ?negative GI ROS, Neg liver ROS,   ?Endo/Other  ?diabetes ? Renal/GU ?Renal disease  ? ?  ?Musculoskeletal ? ? Abdominal ?  ?Peds ? Hematology ?  ?Anesthesia Other Findings ? ? Reproductive/Obstetrics ? ?  ? ? ? ? ? ? ? ? ? ? ? ? ? ?  ?  ? ? ? ? ? ? ? ? ?Anesthesia Physical ?Anesthesia Plan ? ?ASA: 3 ? ?Anesthesia Plan: General  ? ?Post-op Pain Management:   ? ?Induction: Intravenous ? ?PONV Risk Score and Plan: 3 and Ondansetron and Dexamethasone ? ?Airway Management Planned: Oral ETT ? ?Additional Equipment:  ? ?Intra-op Plan:  ? ?Post-operative Plan: Extubation in OR ? ?Informed Consent: I have reviewed the patients History and Physical, chart, labs and discussed the procedure including the risks, benefits and alternatives for the proposed anesthesia with the patient or authorized representative who has indicated his/her understanding and acceptance.  ? ? ? ?Dental advisory given ? ?Plan Discussed with: CRNA and Anesthesiologist ? ?Anesthesia Plan Comments:   ? ? ? ? ? ? ?Anesthesia Quick Evaluation ? ?

## 2021-04-02 NOTE — Interval H&P Note (Signed)
History and Physical Interval Note: ? ?04/02/2021 ?10:32 AM ? ?Samuel Reed  has presented today for surgery, with the diagnosis of CKD V.  The various methods of treatment have been discussed with the patient and family. After consideration of risks, benefits and other options for treatment, the patient has consented to  Procedure(s): ?LAPAROSCOPIC INSERTION CONTINUOUS AMBULATORY PERITONEAL DIALYSIS  (CAPD) CATHETER (N/A) ?RIGHT ARM ARTERIOVENOUS (AV) FISTULA CREATION (Right) as a surgical intervention.  The patient's history has been reviewed, patient examined, no change in status, stable for surgery.  I have reviewed the patient's chart and labs.  Questions were answered to the patient's satisfaction.   ? ? ?Wells Murphy Bundick ? ? ?

## 2021-04-02 NOTE — Transfer of Care (Signed)
Immediate Anesthesia Transfer of Care Note ? ?[name] ? ?Procedure(s) Performed: LAPAROSCOPIC INSERTION CONTINUOUS AMBULATORY PERITONEAL DIALYSIS  (CAPD) CATHETER ?RIGHT RADIOCEPHALIC  ARTERIOVENOUS (AV) FISTULA CREATION (Right: Arm Lower) ? ?Patient Location: PACU ? ?Anesthesia Type:General ? ?Level of Consciousness: awake, alert  and oriented ? ?Airway & Oxygen Therapy: Patient Spontanous Breathing ? ?Post-op Assessment: Report given to RN ? ?Post vital signs: Reviewed and stable ? ?Last Vitals:  ?Vitals Value Taken Time  ?BP 145/63 04/02/21 1442  ?Temp    ?Pulse 57 04/02/21 1443  ?Resp 16 04/02/21 1443  ?SpO2 98 % 04/02/21 1443  ?Vitals shown include unvalidated device data. ? ?Last Pain:  ?Vitals:  ? 04/02/21 0845  ?TempSrc:   ?PainSc: 0-No pain  ?   ? ?  ? ?Complications: No notable events documented. ?

## 2021-04-02 NOTE — Anesthesia Procedure Notes (Signed)
Procedure Name: Intubation ?Date/Time: 04/02/2021 11:24 AM ?Performed by: Thelma Comp, CRNA ?Pre-anesthesia Checklist: Patient identified, Emergency Drugs available, Suction available and Patient being monitored ?Patient Re-evaluated:Patient Re-evaluated prior to induction ?Oxygen Delivery Method: Circle System Utilized ?Preoxygenation: Pre-oxygenation with 100% oxygen ?Induction Type: IV induction ?Ventilation: Mask ventilation without difficulty ?Laryngoscope Size: Glidescope and 4 ?Grade View: Grade I ?Tube type: Oral ?Tube size: 7.5 mm ?Number of attempts: 1 ?Airway Equipment and Method: Stylet ?Placement Confirmation: ETT inserted through vocal cords under direct vision, positive ETCO2 and breath sounds checked- equal and bilateral ?Secured at: 23 cm ?Tube secured with: Tape ?Dental Injury: Teeth and Oropharynx as per pre-operative assessment  ?Comments: Elective glide, previous difficult intubation ? ? ? ? ?

## 2021-04-03 ENCOUNTER — Encounter (HOSPITAL_COMMUNITY): Payer: Self-pay | Admitting: Surgery

## 2021-04-03 ENCOUNTER — Encounter: Payer: Self-pay | Admitting: Internal Medicine

## 2021-04-07 ENCOUNTER — Other Ambulatory Visit: Payer: Self-pay

## 2021-04-07 ENCOUNTER — Encounter: Payer: Self-pay | Admitting: Internal Medicine

## 2021-04-07 ENCOUNTER — Ambulatory Visit: Payer: 59 | Admitting: Internal Medicine

## 2021-04-07 VITALS — BP 128/72 | HR 59 | Ht 72.0 in | Wt 258.4 lb

## 2021-04-07 DIAGNOSIS — E785 Hyperlipidemia, unspecified: Secondary | ICD-10-CM

## 2021-04-07 DIAGNOSIS — E1165 Type 2 diabetes mellitus with hyperglycemia: Secondary | ICD-10-CM

## 2021-04-07 DIAGNOSIS — E1142 Type 2 diabetes mellitus with diabetic polyneuropathy: Secondary | ICD-10-CM

## 2021-04-07 DIAGNOSIS — E669 Obesity, unspecified: Secondary | ICD-10-CM | POA: Diagnosis not present

## 2021-04-07 MED ORDER — OZEMPIC (1 MG/DOSE) 4 MG/3ML ~~LOC~~ SOPN: 1.0000 mg | PEN_INJECTOR | SUBCUTANEOUS | 3 refills | Status: DC

## 2021-04-07 NOTE — Patient Instructions (Addendum)
Please restart: ?- Ozempic 0.5 mg weekly x 2 weeks, then increase to 1 mg weekly ? ?Continue: ?- Lantus 65 units daily but move it to am (may need to reduce to 55 units per meal after starting dialysis) ?- Humalog (10)-10-20 units before B-L-D (may need to reduce to 7-10 units per meal after starting dialysis) ? ? ?
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
## 2021-04-09 ENCOUNTER — Ambulatory Visit: Payer: 59 | Admitting: Cardiovascular Disease

## 2021-04-09 ENCOUNTER — Other Ambulatory Visit: Payer: Self-pay

## 2021-04-09 ENCOUNTER — Encounter: Payer: Self-pay | Admitting: Cardiovascular Disease

## 2021-04-09 DIAGNOSIS — I10 Essential (primary) hypertension: Secondary | ICD-10-CM

## 2021-04-09 DIAGNOSIS — Z9889 Other specified postprocedural states: Secondary | ICD-10-CM | POA: Diagnosis not present

## 2021-04-09 DIAGNOSIS — E782 Mixed hyperlipidemia: Secondary | ICD-10-CM | POA: Diagnosis not present

## 2021-04-09 NOTE — Assessment & Plan Note (Signed)
History of hyperlipidemia on rosuvastatin lipid profile performed 01/07/2019 revealing total cholesterol of 105, LDL 36 and HDL of 32. ?

## 2021-04-09 NOTE — Assessment & Plan Note (Signed)
Status post pericardial window performed by Dr. Kipp Brood in the setting of pericardial tamponade without abnormal pathology.  He did have a recent 2D echo performed during hospitalization last month that showed normal LV systolic function with no evidence of a pericardial effusion. ?

## 2021-04-09 NOTE — Assessment & Plan Note (Signed)
History of essential hypertension blood pressure measured today at 128/62.  He is on amlodipine, metoprolol and hydralazine. ?
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
## 2021-04-09 NOTE — Patient Instructions (Addendum)
Medication Instructions:  ?Your physician recommends that you continue on your current medications as directed. Please refer to the Current Medication list given to you today. ? ?*If you need a refill on your cardiac medications before your next appointment, please call your pharmacy* ? ?Lab Work: ?NONE ordered at this time of appointment  ? ?If you have labs (blood work) drawn today and your tests are completely normal, you will receive your results only by: ?MyChart Message (if you have MyChart) OR ?A paper copy in the mail ?If you have any lab test that is abnormal or we need to change your treatment, we will call you to review the results. ? ?Testing/Procedures: ?NONE ordered at this time of appointment  ? ?Follow-Up: ?At [facility], you and your health needs are our priority.  As part of our continuing mission to provide you with exceptional heart care, we have created designated Provider Care Teams.  These Care Teams include your primary Cardiologist (physician) and Advanced Practice Providers (APPs -  Physician Assistants and Nurse Practitioners) who all work together to provide you with the care you need, when you need it.  ? ?Your next appointment:   ?6 month(s) ? ?The format for your next appointment:   ?In Person ? ?Provider:   ?APP    Then, Quay Burow, MD will plan to see you again in 12 month(s).  ? ?Other Instructions ? ? ?

## 2021-04-21 ENCOUNTER — Other Ambulatory Visit (HOSPITAL_COMMUNITY): Payer: 59

## 2021-04-21 ENCOUNTER — Encounter (HOSPITAL_COMMUNITY): Payer: 59

## 2021-04-21 ENCOUNTER — Encounter: Payer: 59 | Admitting: Surgery

## 2021-04-24 ENCOUNTER — Telehealth: Payer: Self-pay | Admitting: Internal Medicine

## 2021-04-24 NOTE — Telephone Encounter (Signed)
Routed Immunization Record through Epic. ?

## 2021-04-24 NOTE — Telephone Encounter (Signed)
Melissa with the [facility] called asking for all of pt vaccine and immunization records. Lenna Sciara states you can fax it to [phone]. Please advise. ?

## 2021-04-28 ENCOUNTER — Other Ambulatory Visit: Payer: Self-pay

## 2021-04-28 DIAGNOSIS — N185 Chronic kidney disease, stage 5: Secondary | ICD-10-CM

## 2021-05-09 LAB — HM DIABETES EYE EXAM

## 2021-05-12 ENCOUNTER — Ambulatory Visit (HOSPITAL_COMMUNITY)
Admission: RE | Admit: 2021-05-12 | Discharge: 2021-05-12 | Disposition: A | Discharge status: Home / Self Care | Payer: 59 | Source: Ambulatory Visit | Attending: Surgery | Admitting: Surgery

## 2021-05-12 ENCOUNTER — Encounter: Payer: Self-pay | Admitting: Surgery

## 2021-05-12 ENCOUNTER — Ambulatory Visit (INDEPENDENT_AMBULATORY_CARE_PROVIDER_SITE_OTHER): Payer: 59 | Admitting: Surgery

## 2021-05-12 VITALS — BP 112/64 | HR 76 | Temp 98.1°F | Resp 20 | Ht 72.0 in | Wt 247.0 lb

## 2021-05-12 DIAGNOSIS — N185 Chronic kidney disease, stage 5: Secondary | ICD-10-CM

## 2021-05-12 DIAGNOSIS — Z992 Dependence on renal dialysis: Secondary | ICD-10-CM

## 2021-05-12 DIAGNOSIS — N186 End stage renal disease: Secondary | ICD-10-CM

## 2021-05-12 NOTE — Progress Notes (Signed)
? ?Patient name: Samuel Reed MRN: [id number] DOB: 1960/05/30 Sex: male ? ?REASON FOR VISIT:  ? ? ? Post-op ?HISTORY OF PRESENT ILLNESS:  ? ?MARCH STEYER is a 61 y.o. male who on 04/02/2021 underwent laparoscopic insertion of a peritoneal dialysis catheter with omentopexy and a right radiocephalic fistula.  He states his peritoneal catheter is working nicely.  He does not have complaints of steal ? ?CURRENT MEDICATIONS:  ? ? ?Current Outpatient Medications  ?Medication Sig Dispense Refill  ? amLODipine (NORVASC) 10 MG tablet TAKE 1 TABLET BY MOUTH EVERY DAY 30 tablet 11  ? aspirin EC 81 MG EC tablet Take 1 tablet (81 mg total) by mouth daily.    ? BD PEN NEEDLE NANO 2ND GEN 32G X 4 MM MISC USE TO INJECT INSULIN 4 TIMES A DAY 100 each 47  ? Cholecalciferol (VITAMIN D3) 50 MCG (2000 UT) TABS Take 2,000 Units by mouth daily with breakfast.    ? Continuous Blood Gluc Receiver (FREESTYLE LIBRE 2 READER) DEVI 1 each by Does not apply route daily. 1 each 0  ? Continuous Blood Gluc Sensor (FREESTYLE LIBRE 2 SENSOR) MISC 1 each by Does not apply route every 14 (fourteen) days. 6 each 3  ? doxazosin (CARDURA) 4 MG tablet TAKE 1 TABLET BY MOUTH AT BEDTIME. 90 tablet 3  ? doxazosin (CARDURA) 8 MG tablet Take 8 mg by mouth at bedtime.    ? Ferrous Gluconate 324 (37.5 Fe) MG TABS Take 2 tablets by mouth daily.     ? fluticasone (FLONASE) 50 MCG/ACT nasal spray PLACE 1 SPRAY INTO BOTH NOSTRILS 2 (TWO) TIMES DAILY. (Patient taking differently: Place 2 sprays into both nostrils 2 (two) times daily.) 16 mL 11  ? gentamicin cream (GARAMYCIN) 0.1 % Apply 1 application topically 2 (two) times daily. 30 g 1  ? hydrALAZINE (APRESOLINE) 100 MG tablet Take 100 mg by mouth 3 (three) times daily.    ? insulin glargine (LANTUS SOLOSTAR) 100 UNIT/ML Solostar Pen Inject 65 Units into the skin daily. 30 mL 0  ? insulin lispro (HUMALOG KWIKPEN) 100 UNIT/ML KwikPen INJECT 10-20 UNITS TOTAL INTO THE SKIN 3 (THREE)  TIMES DAILY BEFORE MEALS. (Patient taking differently: Inject 20 Units into the skin 3 (three) times daily. INJECT 10-20 UNITS TOTAL INTO THE SKIN 3 (THREE) TIMES DAILY BEFORE MEALS.) 30 mL 7  ? metolazone (ZAROXOLYN) 5 MG tablet Take 5 mg by mouth daily as needed (for extreme swelling).    ? metoprolol tartrate (LOPRESSOR) 100 MG tablet TAKE 1 TABLET BY MOUTH TWICE A DAY 180 tablet 1  ? PRESCRIPTION MEDICATION CPAP: At bedtime    ? rosuvastatin (CRESTOR) 20 MG tablet TAKE 1 TABLET BY MOUTH EVERY DAY (Patient taking differently: Take 20 mg by mouth daily.) 30 tablet 11  ? Semaglutide, 1 MG/DOSE, (OZEMPIC, 1 MG/DOSE,) 4 MG/3ML SOPN Inject 1 mg into the skin once a week. 9 mL 3  ? potassium chloride SA (KLOR-CON M) 20 MEQ tablet Take 2 tablets (40 mEq total) by mouth daily for 30 doses. 60 tablet 0  ? torsemide 40 MG TABS Take 40 mg by mouth 2 (two) times daily. 60 tablet 0  ? ?No current facility-administered medications for this visit.  ? ? ?REVIEW OF SYSTEMS:  ? ?'[X]'$  denotes positive finding, '[ ]'$  denotes negative finding ?Cardiac  Comments:  ?Chest pain or chest pressure:    ?Shortness of breath upon exertion:    ?Short of breath when lying flat:    ?Irregular  heart rhythm:    ?Constitutional    ?Fever or chills:    ? ? ?PHYSICAL EXAM:  ? ?Vitals:  ? 05/12/21 1451  ?BP: 112/64  ?Pulse: 76  ?Resp: 20  ?Temp: 98.1 ?F (36.7 ?C)  ?SpO2: 96%  ?Weight: 247 lb (112 kg)  ?Height: 6' (1.829 m)  ? ? ?GENERAL: The patient is a well-nourished male, in no acute distress. The vital signs are documented above. ?CARDIOVASCULAR: There is a regular rate and rhythm. ?PULMONARY: Non-labored respirations ?I was little unsatisfied with the thrill within the fistula.  I looked at it with the [place].  There do appear to be several branches. ? ?STUDIES:  ? ?Fistula duplex: ?+------------+----------+-------------+----------+--------+  ?OUTFLOW VEINPSV (cm/s)Diameter (cm)Depth (cm)Describe   ?+------------+----------+-------------+----------+--------+  ?Prox UA         31        0.48        0.15             ?+------------+----------+-------------+----------+--------+  ?Mid UA          53        0.53        0.38             ?+------------+----------+-------------+----------+--------+  ?Dist UA         35        0.55        0.60             ?+------------+----------+-------------+----------+--------+  ?AC Fossa        29        0.64        0.35             ?+------------+----------+-------------+----------+--------+  ?Prox Forearm   122        0.56        0.43             ?+------------+----------+-------------+----------+--------+  ?Mid Forearm    159        0.48        0.26             ?+------------+----------+-------------+----------+--------+  ?Dist Forearm   369        0.39        0.22             ?+------------+----------+-------------+----------+--------+ ? ? ?MEDICAL ISSUES:  ? ?ESRD: The patient's PD catheter is working nicely.  The fistula visually looks of adequate diameter, mostly 5 mm, however it is difficult to feel.  I looked at this with the [place].  There are several branches that may be contributing to this as well as the possibility of being a little on the deep side up near the antecubital crease.  Because he has a peritoneal catheter in place, I am going to give him more time to let this mature.  I will see him back in 3 months with a repeat ultrasound.  If I continue to not be satisfied, he will likely need branch ligation and possible elevation. ? ?Annamarie Major, IV, MD, FACS ?[facility] ?Tel [phone] ?Pager [phone] ? ? ?  ?

## 2021-05-16 ENCOUNTER — Other Ambulatory Visit: Payer: Self-pay | Admitting: *Deleted

## 2021-05-16 DIAGNOSIS — N186 End stage renal disease: Secondary | ICD-10-CM

## 2021-05-19 ENCOUNTER — Encounter: Payer: Self-pay | Admitting: Internal Medicine

## 2021-05-19 ENCOUNTER — Ambulatory Visit: Payer: 59 | Admitting: Internal Medicine

## 2021-05-19 VITALS — BP 124/70 | HR 73 | Ht 72.0 in | Wt 249.2 lb

## 2021-05-19 DIAGNOSIS — E785 Hyperlipidemia, unspecified: Secondary | ICD-10-CM | POA: Diagnosis not present

## 2021-05-19 DIAGNOSIS — E669 Obesity, unspecified: Secondary | ICD-10-CM | POA: Diagnosis not present

## 2021-05-19 DIAGNOSIS — E1142 Type 2 diabetes mellitus with diabetic polyneuropathy: Secondary | ICD-10-CM | POA: Diagnosis not present

## 2021-05-19 DIAGNOSIS — E1165 Type 2 diabetes mellitus with hyperglycemia: Secondary | ICD-10-CM

## 2021-05-19 MED ORDER — OZEMPIC (1 MG/DOSE) 4 MG/3ML ~~LOC~~ SOPN: 1.0000 mg | PEN_INJECTOR | SUBCUTANEOUS | 3 refills | Status: DC
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
## 2021-05-19 NOTE — Patient Instructions (Addendum)
Please continue: ?- Lantus 65 units daily in am  ?- Humalog  ?10 units before b'fast ?10-14 units before lunch ?10-14 units before dinner ? ? Please ry to restart: ?- Ozempic 1 mg weekly  ? ?Please return in 3-4 months. ?

## 2021-05-26 ENCOUNTER — Encounter: Payer: Self-pay | Admitting: Podiatry

## 2021-05-26 ENCOUNTER — Ambulatory Visit (INDEPENDENT_AMBULATORY_CARE_PROVIDER_SITE_OTHER): Payer: 59 | Admitting: Podiatry

## 2021-05-26 DIAGNOSIS — L84 Corns and callosities: Secondary | ICD-10-CM | POA: Diagnosis not present

## 2021-05-26 DIAGNOSIS — M216X2 Other acquired deformities of left foot: Secondary | ICD-10-CM

## 2021-05-26 DIAGNOSIS — N185 Chronic kidney disease, stage 5: Secondary | ICD-10-CM

## 2021-05-26 DIAGNOSIS — M79676 Pain in unspecified toe(s): Secondary | ICD-10-CM | POA: Diagnosis not present

## 2021-05-26 DIAGNOSIS — E0843 Diabetes mellitus due to underlying condition with diabetic autonomic (poly)neuropathy: Secondary | ICD-10-CM

## 2021-05-26 DIAGNOSIS — Z794 Long term (current) use of insulin: Secondary | ICD-10-CM

## 2021-05-26 DIAGNOSIS — B351 Tinea unguium: Secondary | ICD-10-CM | POA: Diagnosis not present

## 2021-05-26 DIAGNOSIS — I872 Venous insufficiency (chronic) (peripheral): Secondary | ICD-10-CM

## 2021-05-26 DIAGNOSIS — E1122 Type 2 diabetes mellitus with diabetic chronic kidney disease: Secondary | ICD-10-CM

## 2021-05-26 NOTE — Progress Notes (Signed)
This patient returns to my office for at risk foot care.  This patient requires this care by a professional since this patient will be at risk due to having diabetes,venous stasis and CKD.  This patient is unable to cut nails himself since the patient cannot reach his nails.These nails are painful walking and wearing shoes. He also has painful callus on forefeet  B/l. This patient presents for at risk foot care today. ? ?General Appearance  Alert, conversant and in no acute stress. ? ?Vascular  Dorsalis pedis and posterior tibial  pulses are palpable  bilaterally.  Capillary return is within normal limits  bilaterally. Temperature is within normal limits  bilaterally. ? ?Neurologic  Senn-Weinstein monofilament wire test diminished  bilaterally. Muscle power within normal limits bilaterally. ? ?Nails Thick disfigured discolored nails with subungual debris  from hallux to fifth toes bilaterally. No evidence of bacterial infection or drainage bilaterally. ? ?Orthopedic  No limitations of motion  feet .  No crepitus or effusions noted.  No bony pathology or digital deformities noted. ? ?Skin  normotropic skin noted bilaterally.  No signs of infections or ulcers noted.   Callus sub 4 left and sub 5 right.   ? ?Onychomycosis  Pain in right toes  Pain in left toes  Callus  B/L. ? ?Consent was obtained for treatment procedures.   Mechanical debridement of nails 1-5  bilaterally performed with a nail nipper.  Filed with dremel without incident. Debride callus both feet with # 15 blade. ? ? ?Return office visit   10 weeks                  Told patient to return for periodic foot care and evaluation due to potential at risk complications. ? ? ?Gardiner Barefoot DPM   ?

## 2021-05-27 ENCOUNTER — Other Ambulatory Visit: Payer: Self-pay | Admitting: Internal Medicine

## 2021-05-27 ENCOUNTER — Telehealth: Payer: Self-pay | Admitting: *Deleted

## 2021-05-27 DIAGNOSIS — Z86010 Personal history of colonic polyps: Secondary | ICD-10-CM

## 2021-05-27 NOTE — Progress Notes (Unsigned)
Patient has been scheduled for colonoscopy on 08/05/21 at 730 am. He is scheduled ?

## 2021-05-27 NOTE — Telephone Encounter (Signed)
Patient is scheduled at [facility] for colonoscopy with propofol on 08/05/21 at 730 am. He is scheduled for a previsit on 07/07/21 at 430 pm for instruction. Patient verbalizes understanding of this information and is in agreement with the plan. ?

## 2021-05-27 NOTE — Telephone Encounter (Signed)
Patient needs hospital procedure for hx colon polyps recall; pt with chf, ckd III with fistual placement 03/2021, dialysis 04/21/21; osa ? ?pt will need to be made aware that he is due for recall ? ?I have contacted patient to advise of his need for recall colonoscopy which will need to be completed at the hospital due to his comorbidities. Patient is agreeable to procedure. This has been scheduled for 08/05/21 at 730 am, 600 am arrival at [facility] hospital. ?

## 2021-06-20 ENCOUNTER — Ambulatory Visit: Payer: 59 | Admitting: Podiatry

## 2021-07-02 ENCOUNTER — Other Ambulatory Visit: Payer: Self-pay | Admitting: Internal Medicine

## 2021-07-02 DIAGNOSIS — E1165 Type 2 diabetes mellitus with hyperglycemia: Secondary | ICD-10-CM

## 2021-07-07 ENCOUNTER — Encounter: Payer: Self-pay | Admitting: *Deleted

## 2021-07-07 ENCOUNTER — Ambulatory Visit (AMBULATORY_SURGERY_CENTER): Payer: 59 | Admitting: *Deleted

## 2021-07-07 VITALS — Ht 72.0 in | Wt 250.0 lb

## 2021-07-07 DIAGNOSIS — Z86010 Personal history of colonic polyps: Secondary | ICD-10-CM

## 2021-07-07 MED ORDER — NA SULFATE-K SULFATE-MG SULF 17.5-3.13-1.6 GM/177ML PO SOLN: 2.0000 | Freq: Once | ORAL | 0 refills | Status: AC

## 2021-07-07 NOTE — Progress Notes (Signed)
No egg or soy allergy known to patient  No issues known to pt with past sedation with any surgeries or procedures Patient denies ever being told they had issues or difficulty with intubation  No FH of Malignant Hyperthermia Pt is not on diet pills Pt is not on  home 02  Pt is not on blood thinners  Pt has occasional issues with constipation, uses Miralax as needed. No A fib or A flutter  Pt had questions regarding amount of liquids for prep with his current fluid restrictions.Recommended pt contact his dialysis physician to confirm prep would be acceptable for him.  Pt instructed to use Singlecare.com or GoodRx for a price reduction on prep   PV completed over the phone. Pt verified name, DOB, address and insurance during PV today.   Pt encouraged to call with questions or issues.  If pt has My chart, procedure instructions sent via My Chart  Insurance confirmed with pt at [facility] today

## 2021-07-16 ENCOUNTER — Other Ambulatory Visit: Payer: Self-pay | Admitting: Internal Medicine

## 2021-07-16 DIAGNOSIS — E1142 Type 2 diabetes mellitus with diabetic polyneuropathy: Secondary | ICD-10-CM

## 2021-07-21 ENCOUNTER — Other Ambulatory Visit (HOSPITAL_COMMUNITY): Payer: Self-pay

## 2021-07-21 ENCOUNTER — Telehealth: Payer: Self-pay | Admitting: Pharmacy Technician

## 2021-07-21 NOTE — Telephone Encounter (Signed)
Patient Advocate Encounter   Received notification from CoverMyMeds that prior authorization for [facility] 2 is due for renewal.   PA submitted on 07/21/21 Key [id number] Status is pending    [facility] will continue to follow:

## 2021-07-24 ENCOUNTER — Encounter (HOSPITAL_COMMUNITY): Payer: Self-pay | Admitting: Internal Medicine

## 2021-07-24 NOTE — Telephone Encounter (Signed)
Patient Advocate Encounter  Received notification from [employer] that the request for prior authorization for [facility] 2 has been denied due to: The request for coverage for FREESTY LIBR KIT 2 SENSOR, use as directed (2 per month), is denied. This decision is based on health plan criteria for FREESTY. FREESTY is covered only if:  You have an inadequate glycemic control despite intensive diabetes management.     PA Case ID: [id number]

## 2021-07-30 ENCOUNTER — Ambulatory Visit: Payer: 59 | Admitting: Podiatry

## 2021-07-30 DIAGNOSIS — E1122 Type 2 diabetes mellitus with diabetic chronic kidney disease: Secondary | ICD-10-CM

## 2021-07-30 DIAGNOSIS — M216X2 Other acquired deformities of left foot: Secondary | ICD-10-CM | POA: Diagnosis not present

## 2021-07-30 DIAGNOSIS — Z794 Long term (current) use of insulin: Secondary | ICD-10-CM | POA: Diagnosis not present

## 2021-07-30 DIAGNOSIS — N185 Chronic kidney disease, stage 5: Secondary | ICD-10-CM | POA: Diagnosis not present

## 2021-07-31 ENCOUNTER — Other Ambulatory Visit (HOSPITAL_COMMUNITY): Payer: Self-pay

## 2021-07-31 NOTE — Telephone Encounter (Signed)
Patient Advocate Encounter  Prior Authorization for [employer] 2 has been approved.  (If the pt needs a replacement reader, a new PA will need to be done)  PA# [id number] Effective dates:  through 08/01/22  Per Test Claim Patients co-pay is $224.99 for 3 month supply, $0 for 1 month (28).   Spoke with Pharmacy to Process.  Patient Advocate Fax:  [phone]

## 2021-08-01 NOTE — Progress Notes (Signed)
Subjective:  Patient ID: Samuel Reed, male    DOB: [birth year],  MRN: [id number]  Chief Complaint  Patient presents with   Ulcer    has ulcer on bottom of foot    61 y.o. male presents with the above complaint.  Patient presents with left submetatarsal 4 hyperkeratotic lesion.  Patient states that he is a diabetic.  He states that this was callus he gets thick and hard and then sometimes formed ulceration.  He has a plantarflexed fourth metatarsal.  Hurts with ambulation his last A1c is 6.2.  He has not seen anyone else prior to seeing me for this.  He denies any other acute complaints.  He would like to evaluate this.   Review of Systems: Negative except as noted in the HPI. Denies N/V/F/Ch.  Past Medical History:  Diagnosis Date   Allergy    Anemia    CHF (congestive heart failure) (HCC)    Chronic venous insufficiency    CKD (chronic kidney disease), stage III (HCC)    Complication of anesthesia    patient was still awake when he was being intubated   Diabetes mellitus type II 2003   HLD (hyperlipidemia)    HTN (hypertension) 1980's   Neuromuscular disorder (HCC)    neuropathy feet    OSA (obstructive sleep apnea)    Pericardial effusion    S/P pericardial window 01/16/2019   Sleep apnea    wear cpap     Current Outpatient Medications:    amLODipine (NORVASC) 10 MG tablet, TAKE 1 TABLET BY MOUTH EVERY DAY, Disp: 90 tablet, Rfl: 0   BD PEN NEEDLE NANO 2ND GEN 32G X 4 MM MISC, USE TO INJECT INSULIN 4 TIMES A DAY, Disp: 100 each, Rfl: 47   cholecalciferol (VITAMIN D) 25 MCG (1000 UNIT) tablet, Take 2,000 Units by mouth daily with breakfast., Disp: , Rfl:    Continuous Blood Gluc Receiver (FREESTYLE LIBRE 2 READER) DEVI, 1 each by Does not apply route daily., Disp: 1 each, Rfl: 0   Continuous Blood Gluc Sensor (FREESTYLE LIBRE 2 SENSOR) MISC, 1 each by Does not apply route every 14 (fourteen) days., Disp: 6 each, Rfl: 3   fluticasone (FLONASE) 50 MCG/ACT nasal spray, PLACE  1 SPRAY INTO BOTH NOSTRILS 2 (TWO) TIMES DAILY. (Patient taking differently: Place 2 sprays into both nostrils 2 (two) times daily.), Disp: 16 mL, Rfl: 11   gentamicin cream (GARAMYCIN) 0.1 %, Apply 1 application topically 2 (two) times daily., Disp: 30 g, Rfl: 1   insulin lispro (HUMALOG KWIKPEN) 100 UNIT/ML KwikPen, INJECT 10-20 UNITS TOTAL INTO THE SKIN 3 (THREE) TIMES DAILY BEFORE MEALS., Disp: 30 mL, Rfl: 7   LANTUS SOLOSTAR 100 UNIT/ML Solostar Pen, INJECT 65 UNITS INTO THE SKIN DAILY. (Patient taking differently: Inject 65 Units into the skin every evening.), Disp: 30 mL, Rfl: 1   losartan (COZAAR) 25 MG tablet, Take 25 mg by mouth at bedtime., Disp: , Rfl:    metoprolol tartrate (LOPRESSOR) 100 MG tablet, TAKE 1 TABLET BY MOUTH TWICE A DAY, Disp: 180 tablet, Rfl: 1   potassium chloride SA (KLOR-CON M) 20 MEQ tablet, Take 20 mEq by mouth at bedtime., Disp: , Rfl:    PRESCRIPTION MEDICATION, CPAP: At bedtime, Disp: , Rfl:    rosuvastatin (CRESTOR) 20 MG tablet, TAKE 1 TABLET BY MOUTH EVERY DAY, Disp: 90 tablet, Rfl: 0   Semaglutide, 1 MG/DOSE, (OZEMPIC, 1 MG/DOSE,) 4 MG/3ML SOPN, Inject 1 mg into the skin once a week. (Patient  taking differently: Inject 1 mg into the skin every Sunday.), Disp: 9 mL, Rfl: 3   torsemide (DEMADEX) 100 MG tablet, Take 100 mg by mouth daily., Disp: , Rfl:   Social History   Tobacco Use  Smoking Status Former   Types: Pipe   Quit date: 1991   Years since quitting: 32.5   Passive exposure: Never  Smokeless Tobacco Never    No Known Allergies Objective:  There were no vitals filed for this visit. There is no height or weight on file to calculate BMI. Constitutional Well developed. Well nourished.  Vascular Dorsalis pedis pulses palpable bilaterally. Posterior tibial pulses palpable bilaterally. Capillary refill normal to all digits.  No cyanosis or clubbing noted. Pedal hair growth normal.  Neurologic Normal speech. Oriented to person, place, and  time. Epicritic sensation to light touch grossly present bilaterally.  Dermatologic Nails well groomed and normal in appearance. No open wounds. No skin lesions.  Orthopedic: Left plantarflexed fourth metatarsal with underlying submetatarsal 4 hyperkeratotic lesion.  Pain on palpation to the lesion.  Superficial breakdown noted but no deep ulceration noted.  No redness malodor or probing down to bone noted   Radiographs: 3 views of skeletally mature adult left foot: No signs of osteomyelitis noted plantarflexed fourth metatarsal noted. Assessment:   1. Plantar flexed metatarsal bone of left foot   2. Type 2 diabetes mellitus with stage 5 chronic kidney disease not on chronic dialysis, with long-term current use of insulin ([place])    Plan:  Patient was evaluated and treated and all questions answered.  Left plantarflexed fourth metatarsal -All questions and concerns were discussed with the patient in extensive detail.  Given the amount of pressure that is experienced under the submetatarsal 5 believe patient would benefit from floating osteotomy of the fourth.  I discussed with the patient extensive detail he states understand like to proceed with surgery discussed and appreciate operative intraoperative postoperative plan in extensive detail.  He states understanding.  He will be able to weight-bear as tolerated with surgical shoe -Informed surgical risk consent was reviewed and read aloud to the patient.  I reviewed the films.  I have discussed my findings with the patient in great detail.  I have discussed all risks including but not limited to infection, stiffness, scarring, limp, disability, deformity, damage to blood vessels and nerves, numbness, poor healing, need for braces, arthritis, chronic pain, amputation, death.  All benefits and realistic expectations discussed in great detail.  I have made no promises as to the outcome.  I have provided realistic expectations.  I have offered the  patient a 2nd opinion, which they have declined and assured me they preferred to proceed despite the risks   No follow-ups on file.

## 2021-08-04 ENCOUNTER — Ambulatory Visit: Payer: 59 | Admitting: Surgery

## 2021-08-04 ENCOUNTER — Telehealth: Payer: Self-pay | Admitting: Internal Medicine

## 2021-08-04 ENCOUNTER — Ambulatory Visit (HOSPITAL_COMMUNITY)
Admission: RE | Admit: 2021-08-04 | Discharge: 2021-08-04 | Disposition: A | Discharge status: Home / Self Care | Payer: 59 | Source: Ambulatory Visit | Attending: Surgery | Admitting: Surgery

## 2021-08-04 ENCOUNTER — Encounter: Payer: Self-pay | Admitting: Surgery

## 2021-08-04 VITALS — BP 138/77 | HR 69 | Temp 98.2°F | Resp 20 | Ht 72.0 in | Wt 255.0 lb

## 2021-08-04 DIAGNOSIS — Z992 Dependence on renal dialysis: Secondary | ICD-10-CM

## 2021-08-04 DIAGNOSIS — N186 End stage renal disease: Secondary | ICD-10-CM

## 2021-08-04 NOTE — Telephone Encounter (Signed)
Pt took ozempic yesterday and was supposed to hold it prior to procedure at hospital tomorrow. I think the peanuts and red jello should not be an issue. Please advise regarding the ozempic as to if he is ok to proceed with procedure tomorrow.

## 2021-08-04 NOTE — Telephone Encounter (Signed)
Patient has procedure at [facility] tomorrow morning.  Saturday he ate red jello and peanuts and he took his Ozempic yesterday.  Please advise if he can still have the procedure.  Thank you.

## 2021-08-04 NOTE — Telephone Encounter (Signed)
Spoke with pt and let him know Dr. Vena Rua message. Pt verbalized understanding. Procedure canceled.
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
## 2021-08-04 NOTE — Telephone Encounter (Signed)
Please let patient know I just heard back from the hospital and anesthesia says this needs to be rescheduled due to [facility]

## 2021-08-05 ENCOUNTER — Ambulatory Visit (HOSPITAL_COMMUNITY): Admission: RE | Admit: 2021-08-05 | Payer: 59 | Source: Home / Self Care | Admitting: Internal Medicine

## 2021-08-05 SURGERY — COLONOSCOPY WITH PROPOFOL
Anesthesia: Monitor Anesthesia Care

## 2021-08-07 ENCOUNTER — Telehealth: Payer: Self-pay | Admitting: Urology

## 2021-08-07 NOTE — Telephone Encounter (Signed)
DOS - 09/01/21  METATARSAL OSTEOTOMY 4TH LEFT --- 28308  [employer] EFFECTIVE DATE - 01/26/21  PLAN DEDUCTIBLE - $500.00 W/ $0.00 REMAINING OUT OF POCKET - $2,500.00 W/ $0.00 REMAINING COINSURANCE - 20% COPAY - $0.00  PER [employer] WEB SITE FOR CPT CODE 50388 Notification or Prior Authorization is not required for the requested services  This [employer] plan does not currently require a prior authorization for these services. If you have general questions about the prior authorization requirements, please call us at [phone] or visit [email or web address] > Clinician Resources > Advance and Admission Notification Requirements. The number above acknowledges your notification. Please write this number down for future reference. Notification is not a guarantee of coverage or payment.  Decision ID #:[id number]

## 2021-08-07 NOTE — Telephone Encounter (Signed)
Pts procedure rescheduled at [facility] 10/06/21 at 8:45am, pt to arrive there at 7:15am. Case #[id number].Pt aware of appt, updated instructions sent to pt via mychart. Note to [facility].

## 2021-08-08 ENCOUNTER — Ambulatory Visit: Payer: 59 | Admitting: Podiatry

## 2021-09-01 ENCOUNTER — Encounter: Payer: Self-pay | Admitting: Podiatry

## 2021-09-01 ENCOUNTER — Other Ambulatory Visit: Payer: Self-pay | Admitting: Podiatry

## 2021-09-01 DIAGNOSIS — M21542 Acquired clubfoot, left foot: Secondary | ICD-10-CM | POA: Diagnosis not present

## 2021-09-01 MED ORDER — IBUPROFEN 800 MG PO TABS
800.0000 mg | ORAL_TABLET | Freq: Four times a day (QID) | ORAL | 1 refills | Status: DC | PRN
Start: 2021-09-01 — End: 2021-09-30

## 2021-09-01 MED ORDER — OXYCODONE-ACETAMINOPHEN 5-325 MG PO TABS: 1.0000 | ORAL_TABLET | ORAL | 0 refills | Status: DC | PRN

## 2021-09-02 ENCOUNTER — Encounter: Payer: 59 | Admitting: Internal Medicine

## 2021-09-10 ENCOUNTER — Ambulatory Visit (INDEPENDENT_AMBULATORY_CARE_PROVIDER_SITE_OTHER): Payer: 59 | Admitting: Podiatry

## 2021-09-10 ENCOUNTER — Ambulatory Visit (INDEPENDENT_AMBULATORY_CARE_PROVIDER_SITE_OTHER): Payer: 59

## 2021-09-10 DIAGNOSIS — Z9889 Other specified postprocedural states: Secondary | ICD-10-CM

## 2021-09-10 DIAGNOSIS — M216X2 Other acquired deformities of left foot: Secondary | ICD-10-CM

## 2021-09-10 NOTE — Progress Notes (Signed)
Subjective:  Patient ID: Samuel Reed, male    DOB: [birth year],  MRN: [id number]  Chief Complaint  Patient presents with   Post-op Follow-up    POV #1 DOS 09/01/2021 LT 4TH METATARSAL OSTEOTOMY FLOATING    DOS: 09/01/2021 Procedure: Left fourth metatarsal floating osteotomy  61 y.o. male returns for post-op check.  Patient states that he is doing okay.  Minimal pain.  Weightbearing as tolerated in surgical shoe bandages clean dry and intact.  Review of Systems: Negative except as noted in the HPI. Denies N/V/F/Ch.  Past Medical History:  Diagnosis Date   Allergy    Anemia    CHF (congestive heart failure) (HCC)    Chronic venous insufficiency    CKD (chronic kidney disease), stage III (HCC)    Complication of anesthesia    patient was still awake when he was being intubated   Diabetes mellitus type II 2003   HLD (hyperlipidemia)    HTN (hypertension) 1980's   Neuromuscular disorder (HCC)    neuropathy feet    OSA (obstructive sleep apnea)    Pericardial effusion    S/P pericardial window 01/16/2019   Sleep apnea    wear cpap     Current Outpatient Medications:    amLODipine (NORVASC) 10 MG tablet, TAKE 1 TABLET BY MOUTH EVERY DAY, Disp: 90 tablet, Rfl: 0   BD PEN NEEDLE NANO 2ND GEN 32G X 4 MM MISC, USE TO INJECT INSULIN 4 TIMES A DAY, Disp: 100 each, Rfl: 47   cholecalciferol (VITAMIN D) 25 MCG (1000 UNIT) tablet, Take 2,000 Units by mouth daily with breakfast., Disp: , Rfl:    Continuous Blood Gluc Receiver (FREESTYLE LIBRE 2 READER) DEVI, 1 each by Does not apply route daily., Disp: 1 each, Rfl: 0   Continuous Blood Gluc Sensor (FREESTYLE LIBRE 2 SENSOR) MISC, 1 each by Does not apply route every 14 (fourteen) days., Disp: 6 each, Rfl: 3   fluticasone (FLONASE) 50 MCG/ACT nasal spray, PLACE 1 SPRAY INTO BOTH NOSTRILS 2 (TWO) TIMES DAILY. (Patient taking differently: Place 2 sprays into both nostrils 2 (two) times daily.), Disp: 16 mL, Rfl: 11   gentamicin cream  (GARAMYCIN) 0.1 %, Apply 1 application topically 2 (two) times daily., Disp: 30 g, Rfl: 1   ibuprofen (ADVIL) 800 MG tablet, Take 1 tablet (800 mg total) by mouth every 6 (six) hours as needed., Disp: 60 tablet, Rfl: 1   insulin lispro (HUMALOG KWIKPEN) 100 UNIT/ML KwikPen, INJECT 10-20 UNITS TOTAL INTO THE SKIN 3 (THREE) TIMES DAILY BEFORE MEALS., Disp: 30 mL, Rfl: 7   LANTUS SOLOSTAR 100 UNIT/ML Solostar Pen, INJECT 65 UNITS INTO THE SKIN DAILY. (Patient taking differently: Inject 65 Units into the skin every evening.), Disp: 30 mL, Rfl: 1   losartan (COZAAR) 25 MG tablet, Take 25 mg by mouth at bedtime., Disp: , Rfl:    metoprolol tartrate (LOPRESSOR) 100 MG tablet, TAKE 1 TABLET BY MOUTH TWICE A DAY, Disp: 180 tablet, Rfl: 1   potassium chloride SA (KLOR-CON M) 20 MEQ tablet, Take 20 mEq by mouth at bedtime., Disp: , Rfl:    PRESCRIPTION MEDICATION, CPAP: At bedtime, Disp: , Rfl:    rosuvastatin (CRESTOR) 20 MG tablet, TAKE 1 TABLET BY MOUTH EVERY DAY, Disp: 90 tablet, Rfl: 0   Semaglutide, 1 MG/DOSE, (OZEMPIC, 1 MG/DOSE,) 4 MG/3ML SOPN, Inject 1 mg into the skin once a week. (Patient taking differently: Inject 1 mg into the skin every Sunday.), Disp: 9 mL, Rfl: 3  torsemide (DEMADEX) 100 MG tablet, Take 100 mg by mouth daily., Disp: , Rfl:    oxyCODONE-acetaminophen (PERCOCET) 5-325 MG tablet, Take 1 tablet by mouth every 4 (four) hours as needed for severe pain., Disp: 30 tablet, Rfl: 0  Social History   Tobacco Use  Smoking Status Former   Types: Pipe   Quit date: 1991   Years since quitting: 32.6   Passive exposure: Never  Smokeless Tobacco Never    No Known Allergies Objective:  There were no vitals filed for this visit. There is no height or weight on file to calculate BMI. Constitutional Well developed. Well nourished.  Vascular Foot warm and well perfused. Capillary refill normal to all digits.   Neurologic Normal speech. Oriented to person, place, and time. Epicritic  sensation to light touch grossly present bilaterally.  Dermatologic Skin healing well without signs of infection. Skin edges well coapted without signs of infection.  Orthopedic: Tenderness to palpation noted about the surgical site.   Radiographs: 3 views of skeletally mature adult left foot: Good correction alignment noted osteotomies noted reduction of plantar pressure noted. Assessment:   1. Post-operative state   2. Plantar flexed metatarsal bone of left foot    Plan:  Patient was evaluated and treated and all questions answered.  S/p foot surgery left -Progressing as expected post-operatively. -XR: See above -WB Status: Weightbearing as tolerated in surgical shoe -Sutures: Intact.  No clinical signs of Deis is noted no complication noted. -Medications: None -Foot redressed.  No follow-ups on file.

## 2021-09-22 ENCOUNTER — Encounter: Payer: Self-pay | Admitting: Internal Medicine

## 2021-09-22 ENCOUNTER — Ambulatory Visit (INDEPENDENT_AMBULATORY_CARE_PROVIDER_SITE_OTHER): Payer: 59 | Admitting: Internal Medicine

## 2021-09-22 VITALS — BP 130/74 | HR 69 | Ht 72.0 in | Wt 250.8 lb

## 2021-09-22 DIAGNOSIS — E669 Obesity, unspecified: Secondary | ICD-10-CM | POA: Diagnosis not present

## 2021-09-22 DIAGNOSIS — E1165 Type 2 diabetes mellitus with hyperglycemia: Secondary | ICD-10-CM | POA: Diagnosis not present

## 2021-09-22 DIAGNOSIS — E785 Hyperlipidemia, unspecified: Secondary | ICD-10-CM | POA: Diagnosis not present

## 2021-09-22 DIAGNOSIS — E1142 Type 2 diabetes mellitus with diabetic polyneuropathy: Secondary | ICD-10-CM

## 2021-09-22 LAB — POCT GLYCOSYLATED HEMOGLOBIN (HGB A1C): Hemoglobin A1C: 6.9 % — AB (ref 4.0–5.6)

## 2021-09-22 MED ORDER — FREESTYLE LIBRE 3 SENSOR MISC: 1.0000 | 3 refills | Status: DC
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
## 2021-09-22 NOTE — Patient Instructions (Addendum)
Please change: - Lantus 70 units daily at bedtime - Humalog  10 units before b'fast 22-24 units before lunch 16-24 units before dinner - Ozempic 1 mg weekly   Please return in 3-4 months.

## 2021-09-24 ENCOUNTER — Encounter: Payer: 59 | Admitting: Podiatry

## 2021-09-26 ENCOUNTER — Encounter (HOSPITAL_COMMUNITY): Payer: Self-pay | Admitting: Internal Medicine

## 2021-09-27 ENCOUNTER — Other Ambulatory Visit: Payer: Self-pay | Admitting: Internal Medicine

## 2021-10-03 ENCOUNTER — Ambulatory Visit (INDEPENDENT_AMBULATORY_CARE_PROVIDER_SITE_OTHER): Payer: 59 | Admitting: Podiatry

## 2021-10-03 DIAGNOSIS — M216X2 Other acquired deformities of left foot: Secondary | ICD-10-CM

## 2021-10-03 DIAGNOSIS — Z9889 Other specified postprocedural states: Secondary | ICD-10-CM

## 2021-10-03 NOTE — Progress Notes (Signed)
Subjective:  Patient ID: Samuel Reed, male    DOB: 1960/02/13,  MRN: [id number]  Chief Complaint  Patient presents with   Routine Post Op    POV #2 DOS 09/01/2021 LT 4TH METATARSAL OSTEOTOMY FLOATING    DOS: 09/01/2021 Procedure: Left fourth metatarsal floating osteotomy  61 y.o. male returns for post-op check.  Patient states that doing okay no pain.  He is returned to regular shoes without any restrictions.  Review of Systems: Negative except as noted in the HPI. Denies N/V/F/Ch.  Past Medical History:  Diagnosis Date   Allergy    Anemia    CHF (congestive heart failure) (HCC)    Chronic venous insufficiency    CKD (chronic kidney disease), stage III (HCC)    Complication of anesthesia    patient was still awake when he was being intubated   Diabetes mellitus type II 2003   HLD (hyperlipidemia)    HTN (hypertension) 1980's   Neuromuscular disorder (HCC)    neuropathy feet    OSA (obstructive sleep apnea)    Pericardial effusion    S/P pericardial window 01/16/2019   Sleep apnea    wear cpap     Current Outpatient Medications:    amLODipine (NORVASC) 10 MG tablet, TAKE 1 TABLET BY MOUTH EVERY DAY, Disp: 90 tablet, Rfl: 0   BD PEN NEEDLE NANO 2ND GEN 32G X 4 MM MISC, USE TO INJECT INSULIN 4 TIMES A DAY, Disp: 100 each, Rfl: 47   cholecalciferol (VITAMIN D) 25 MCG (1000 UNIT) tablet, Take 2,000 Units by mouth daily with breakfast., Disp: , Rfl:    Continuous Blood Gluc Sensor (FREESTYLE LIBRE 3 SENSOR) MISC, 1 each by Does not apply route every 14 (fourteen) days., Disp: 6 each, Rfl: 3   fluticasone (FLONASE) 50 MCG/ACT nasal spray, PLACE 1 SPRAY INTO BOTH NOSTRILS 2 (TWO) TIMES DAILY. (Patient taking differently: Place 2 sprays into both nostrils 2 (two) times daily.), Disp: 16 mL, Rfl: 11   gentamicin cream (GARAMYCIN) 0.1 %, Apply 1 application topically 2 (two) times daily. (Patient taking differently: Apply 1 application  topically daily.), Disp: 30 g, Rfl: 1    insulin lispro (HUMALOG KWIKPEN) 100 UNIT/ML KwikPen, INJECT 10-20 UNITS TOTAL INTO THE SKIN 3 (THREE) TIMES DAILY BEFORE MEALS., Disp: 30 mL, Rfl: 7   LANTUS SOLOSTAR 100 UNIT/ML Solostar Pen, INJECT 65 UNITS INTO THE SKIN DAILY. (Patient taking differently: Inject 65 Units into the skin every evening.), Disp: 30 mL, Rfl: 1   losartan (COZAAR) 100 MG tablet, Take 100 mg by mouth at bedtime., Disp: , Rfl:    metoprolol tartrate (LOPRESSOR) 100 MG tablet, TAKE 1 TABLET BY MOUTH TWICE A DAY, Disp: 60 tablet, Rfl: 3   potassium chloride SA (KLOR-CON M) 20 MEQ tablet, Take 20 mEq by mouth at bedtime., Disp: , Rfl:    PRESCRIPTION MEDICATION, CPAP: At bedtime, Disp: , Rfl:    rosuvastatin (CRESTOR) 20 MG tablet, TAKE 1 TABLET BY MOUTH EVERY DAY, Disp: 90 tablet, Rfl: 0   Semaglutide, 1 MG/DOSE, (OZEMPIC, 1 MG/DOSE,) 4 MG/3ML SOPN, Inject 1 mg into the skin once a week. (Patient taking differently: Inject 1 mg into the skin every Sunday.), Disp: 9 mL, Rfl: 3   sevelamer carbonate (RENVELA) 800 MG tablet, Take 800 mg by mouth 3 (three) times daily with meals., Disp: , Rfl:    torsemide (DEMADEX) 100 MG tablet, Take 100 mg by mouth daily., Disp: , Rfl:   Social History   Tobacco Use  Smoking Status Former   Types: Pipe   Quit date: 1991   Years since quitting: 32.7   Passive exposure: Never  Smokeless Tobacco Never    No Known Allergies Objective:  There were no vitals filed for this visit. There is no height or weight on file to calculate BMI. Constitutional Well developed. Well nourished.  Vascular Foot warm and well perfused. Capillary refill normal to all digits.   Neurologic Normal speech. Oriented to person, place, and time. Epicritic sensation to light touch grossly present bilaterally.  Dermatologic Skin completely epithelialized.  No signs of dehiscence noted.  No complication noted.  Orthopedic: No further tenderness to palpation noted about the surgical site.   Radiographs: 3  views of skeletally mature adult left foot: Good correction alignment noted osteotomies noted reduction of plantar pressure noted. Assessment:   No diagnosis found.  Plan:  Patient was evaluated and treated and all questions answered.  S/p foot surgery left -Clinically healed and officially discharged from my care if any foot and ankle issues issues arise in the future of asked her to come and she will see me.  I discussed offloading and shoe gear modification extensive detail he states understanding  No follow-ups on file.

## 2021-10-05 NOTE — Anesthesia Preprocedure Evaluation (Signed)
Anesthesia Evaluation  Patient identified by MRN, date of birth, ID band Patient awake    Reviewed: Allergy & Precautions, NPO status , Patient's Chart, lab work & pertinent test results  History of Anesthesia Complications Negative for: history of anesthetic complications  Airway Mallampati: II  TM Distance: >3 FB Neck ROM: Full    Dental  (+) Dental Advisory Given   Pulmonary sleep apnea and Continuous Positive Airway Pressure Ventilation , former smoker,    Pulmonary exam normal        Cardiovascular hypertension, Pt. on medications and Pt. on home beta blockers +CHF  Normal cardiovascular exam  Echo 2023  IMPRESSIONS    1. Left ventricular ejection fraction, by estimation, is 60 to 65%. The  left ventricle has normal function. The left ventricle has no regional  wall motion abnormalities. There is moderate asymmetric left ventricular  hypertrophy of the septal segment.  Left ventricular diastolic parameters are consistent with Grade III  diastolic dysfunction (restrictive). Elevated left atrial pressure.  2. Right ventricular systolic function is normal. The right ventricular  size is normal. Tricuspid regurgitation signal is inadequate for assessing  PA pressure.  3. Left atrial size was severely dilated.  4. Right atrial size was mildly dilated.  5. The mitral valve is degenerative. Trivial mitral valve regurgitation.  Moderate mitral annular calcification.  6. The aortic valve is tricuspid. Aortic valve regurgitation is not  visualized. Aortic valve sclerosis is present, with no evidence of aortic  valve stenosis.  7. The inferior vena cava is normal in size with greater than 50%  respiratory variability, suggesting right atrial pressure of 3 mmHg.    Neuro/Psych    GI/Hepatic negative GI ROS, Neg liver ROS,   Endo/Other  diabetes  Renal/GU Renal disease     Musculoskeletal   Abdominal   Peds   Hematology   Anesthesia Other Findings   Reproductive/Obstetrics                            Anesthesia Physical  Anesthesia Plan  ASA: 3  Anesthesia Plan: MAC   Post-op Pain Management:    Induction: Intravenous  PONV Risk Score and Plan: 2 and Ondansetron and Propofol infusion  Airway Management Planned: Natural Airway  Additional Equipment:   Intra-op Plan:   Post-operative Plan:   Informed Consent: I have reviewed the patients History and Physical, chart, labs and discussed the procedure including the risks, benefits and alternatives for the proposed anesthesia with the patient or authorized representative who has indicated his/her understanding and acceptance.     Dental advisory given  Plan Discussed with: Anesthesiologist and CRNA  Anesthesia Plan Comments:        Anesthesia Quick Evaluation

## 2021-10-06 ENCOUNTER — Ambulatory Visit (HOSPITAL_COMMUNITY): Payer: 59 | Admitting: Registered Nurse

## 2021-10-06 ENCOUNTER — Other Ambulatory Visit: Payer: Self-pay

## 2021-10-06 ENCOUNTER — Encounter (HOSPITAL_COMMUNITY)
Admission: RE | Disposition: A | Discharge status: Home / Self Care | Payer: Self-pay | Source: Home / Self Care | Attending: Internal Medicine

## 2021-10-06 ENCOUNTER — Ambulatory Visit (HOSPITAL_BASED_OUTPATIENT_CLINIC_OR_DEPARTMENT_OTHER): Payer: 59 | Admitting: Registered Nurse

## 2021-10-06 ENCOUNTER — Encounter (HOSPITAL_COMMUNITY): Payer: Self-pay | Admitting: Internal Medicine

## 2021-10-06 ENCOUNTER — Ambulatory Visit (HOSPITAL_COMMUNITY)
Admission: RE | Admit: 2021-10-06 | Discharge: 2021-10-06 | Disposition: A | Discharge status: Home / Self Care | Payer: 59 | Attending: Internal Medicine | Admitting: Internal Medicine

## 2021-10-06 DIAGNOSIS — I509 Heart failure, unspecified: Secondary | ICD-10-CM | POA: Insufficient documentation

## 2021-10-06 DIAGNOSIS — G4730 Sleep apnea, unspecified: Secondary | ICD-10-CM | POA: Insufficient documentation

## 2021-10-06 DIAGNOSIS — D124 Benign neoplasm of descending colon: Secondary | ICD-10-CM | POA: Diagnosis not present

## 2021-10-06 DIAGNOSIS — I110 Hypertensive heart disease with heart failure: Secondary | ICD-10-CM | POA: Diagnosis not present

## 2021-10-06 DIAGNOSIS — E1122 Type 2 diabetes mellitus with diabetic chronic kidney disease: Secondary | ICD-10-CM | POA: Insufficient documentation

## 2021-10-06 DIAGNOSIS — Z860100 Personal history of colon polyps, unspecified: Secondary | ICD-10-CM

## 2021-10-06 DIAGNOSIS — D125 Benign neoplasm of sigmoid colon: Secondary | ICD-10-CM

## 2021-10-06 DIAGNOSIS — I5033 Acute on chronic diastolic (congestive) heart failure: Secondary | ICD-10-CM

## 2021-10-06 DIAGNOSIS — D120 Benign neoplasm of cecum: Secondary | ICD-10-CM

## 2021-10-06 DIAGNOSIS — Z87891 Personal history of nicotine dependence: Secondary | ICD-10-CM | POA: Insufficient documentation

## 2021-10-06 DIAGNOSIS — I130 Hypertensive heart and chronic kidney disease with heart failure and stage 1 through stage 4 chronic kidney disease, or unspecified chronic kidney disease: Secondary | ICD-10-CM | POA: Insufficient documentation

## 2021-10-06 DIAGNOSIS — K635 Polyp of colon: Secondary | ICD-10-CM

## 2021-10-06 DIAGNOSIS — Z09 Encounter for follow-up examination after completed treatment for conditions other than malignant neoplasm: Secondary | ICD-10-CM

## 2021-10-06 DIAGNOSIS — Z1211 Encounter for screening for malignant neoplasm of colon: Secondary | ICD-10-CM | POA: Insufficient documentation

## 2021-10-06 DIAGNOSIS — N183 Chronic kidney disease, stage 3 unspecified: Secondary | ICD-10-CM | POA: Diagnosis not present

## 2021-10-06 HISTORY — PX: COLONOSCOPY WITH PROPOFOL: SHX5780

## 2021-10-06 HISTORY — PX: POLYPECTOMY: SHX5525

## 2021-10-06 LAB — GLUCOSE, CAPILLARY: Glucose-Capillary: 115 mg/dL — ABNORMAL HIGH (ref 70–99)

## 2021-10-06 SURGERY — COLONOSCOPY WITH PROPOFOL
Anesthesia: Monitor Anesthesia Care

## 2021-10-06 MED ORDER — SODIUM CHLORIDE 0.9 % IV SOLN: INTRAVENOUS | Status: DC | PRN

## 2021-10-06 MED ORDER — PROPOFOL 1000 MG/100ML IV EMUL
INTRAVENOUS | Status: AC
  Filled 2021-10-06: qty 100

## 2021-10-06 MED ORDER — PROPOFOL 500 MG/50ML IV EMUL
INTRAVENOUS | Status: DC | PRN
  Administered 2021-10-06: 10 mg via INTRAVENOUS
  Administered 2021-10-06: 130 ug/kg/min via INTRAVENOUS
  Administered 2021-10-06: 20 mg via INTRAVENOUS

## 2021-10-06 MED ORDER — SODIUM CHLORIDE 0.9 % IV SOLN: INTRAVENOUS | Status: DC

## 2021-10-06 MED ORDER — PHENYLEPHRINE HCL (PRESSORS) 10 MG/ML IV SOLN
INTRAVENOUS | Status: AC
  Filled 2021-10-06: qty 1

## 2021-10-06 MED ORDER — PROPOFOL 500 MG/50ML IV EMUL
INTRAVENOUS | Status: AC
  Filled 2021-10-06: qty 50

## 2021-10-06 SURGICAL SUPPLY — 22 items

## 2021-10-06 NOTE — Anesthesia Postprocedure Evaluation (Signed)
Anesthesia Post Note  [name]  Procedure(s) Performed: COLONOSCOPY WITH PROPOFOL POLYPECTOMY     Patient location during evaluation: Endoscopy Anesthesia Type: MAC Level of consciousness: awake and alert Pain management: pain level controlled Vital Signs Assessment: post-procedure vital signs reviewed and stable Respiratory status: spontaneous breathing and respiratory function stable Cardiovascular status: stable Postop Assessment: no apparent nausea or vomiting Anesthetic complications: no   No notable events documented.  Last Vitals:  Vitals:   10/06/21 0930 10/06/21 0947  BP: (!) 113/48 (!) 141/60  Pulse:  61  Resp: 17 13  Temp:    SpO2:  100%    Last Pain:  Vitals:   10/06/21 0947  TempSrc:   PainSc: 0-No pain                 Cheyla Duchemin DANIEL

## 2021-10-06 NOTE — Transfer of Care (Signed)
Immediate Anesthesia Transfer of Care Note  [name]  Procedure(s) Performed: COLONOSCOPY WITH PROPOFOL POLYPECTOMY  Patient Location: PACU and Endoscopy Unit  Anesthesia Type:MAC  Level of Consciousness: awake, alert , oriented and patient cooperative  Airway & Oxygen Therapy: Patient Spontanous Breathing and Patient connected to face mask oxygen  Post-op Assessment: Report given to RN, Post -op Vital signs reviewed and stable and Patient moving all extremities  Post vital signs: Reviewed and stable  Last Vitals:  Vitals Value Taken Time  BP 91/37 10/06/21 0921  Temp    Pulse 59 10/06/21 0923  Resp 16 10/06/21 0923  SpO2 100 % 10/06/21 0923  Vitals shown include unvalidated device data.  Last Pain:  Vitals:   10/06/21 0736  TempSrc: Temporal  PainSc: 0-No pain         Complications: No notable events documented.

## 2021-10-06 NOTE — Discharge Instructions (Signed)
YOU HAD AN ENDOSCOPIC PROCEDURE TODAY: Refer to the procedure report and other information in the discharge instructions given to you for any specific questions about what was found during the examination. If this information does not answer your questions, please call [place] office at [phone] to clarify.  ° °YOU SHOULD EXPECT: Some feelings of bloating in the abdomen. Passage of more gas than usual. Walking can help get rid of the air that was put into your GI tract during the procedure and reduce the bloating. If you had a lower endoscopy (such as a colonoscopy or flexible sigmoidoscopy) you may notice spotting of blood in your stool or on the toilet paper. Some abdominal soreness may be present for a day or two, also. ° °DIET: Your first meal following the procedure should be a light meal and then it is ok to progress to your normal diet. A half-sandwich or bowl of soup is an example of a good first meal. Heavy or fried foods are harder to digest and may make you feel nauseous or bloated. Drink plenty of fluids but you should avoid alcoholic beverages for 24 hours. If you had a esophageal dilation, please see attached instructions for diet.   ° °ACTIVITY: Your care partner should take you home directly after the procedure. You should plan to take it easy, moving slowly for the rest of the day. You can resume normal activity the day after the procedure however YOU SHOULD NOT DRIVE, use power tools, machinery or perform tasks that involve climbing or major physical exertion for 24 hours (because of the sedation medicines used during the test).  ° °SYMPTOMS TO REPORT IMMEDIATELY: °A gastroenterologist can be reached at any hour. Please call [phone]  for any of the following symptoms:  °Following lower endoscopy (colonoscopy, flexible sigmoidoscopy) °Excessive amounts of blood in the stool  °Significant tenderness, worsening of abdominal pains  °Swelling of the abdomen that is new, acute  °Fever of 100° or  higher  °Following upper endoscopy (EGD, EUS, ERCP, esophageal dilation) °Vomiting of blood or coffee ground material  °New, significant abdominal pain  °New, significant chest pain or pain under the shoulder blades  °Painful or persistently difficult swallowing  °New shortness of breath  °Black, tarry-looking or red, bloody stools ° °FOLLOW UP:  °If any biopsies were taken you will be contacted by phone or by letter within the next 1-3 weeks. Call [phone]  if you have not heard about the biopsies in 3 weeks.  °Please also call with any specific questions about appointments or follow up tests. ° °
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
## 2021-10-06 NOTE — Op Note (Signed)
[facility] Patient Name: Samuel Reed Procedure Date: 10/06/2021 MRN: [id number] Attending MD: Jerene Bears , MD Date of Birth: [birth year] CSN: [id number] Age: 61 Admit Type: Outpatient Procedure:                Colonoscopy Indications:              High risk colon cancer surveillance: Personal                            history of multiple adenomas, Last colonoscopy:                            March 2021 (10 adenomas removed) Providers:                Lajuan Lines. Hilarie Fredrickson, MD, Dulcy Fanny, Hinton Dyer                            Technician, Technician Referring MD:             [name] Medicines:                Monitored Anesthesia Care Complications:            No immediate complications. Estimated Blood Loss:     Estimated blood loss was minimal. Procedure:                Pre-Anesthesia Assessment:                           - Prior to the procedure, a History and Physical                            was performed, and patient medications and                            allergies were reviewed. The patient's tolerance of                            previous anesthesia was also reviewed. The risks                            and benefits of the procedure and the sedation                            options and risks were discussed with the patient.                            All questions were answered, and informed consent                            was obtained. Prior Anticoagulants: The patient has                            taken no previous anticoagulant or antiplatelet  agents. ASA Grade Assessment: III - A patient with                            severe systemic disease. After reviewing the risks                            and benefits, the patient was deemed in                            satisfactory condition to undergo the procedure.                           After obtaining informed consent, the colonoscope                             was passed under direct vision. Throughout the                            procedure, the patient's blood pressure, pulse, and                            oxygen saturations were monitored continuously. The                            CF-HQ190L ([id number]) Olympus colonoscope was                            introduced through the anus and advanced to the                            cecum, identified by appendiceal orifice and                            ileocecal valve. The colonoscopy was performed                            without difficulty. The patient tolerated the                            procedure well. The quality of the bowel                            preparation was good. The ileocecal valve,                            appendiceal orifice, and rectum were photographed. Scope In: 8:51:06 AM Scope Out: 9:13:41 AM Scope Withdrawal Time: 0 hours 19 minutes 17 seconds  Total Procedure Duration: 0 hours 22 minutes 35 seconds  Findings:      The digital rectal exam was normal.      A 8 mm polyp was found in the cecum. The polyp was sessile. The polyp       was removed with a cold snare. Resection and retrieval were complete.      A 5 mm polyp was found in the  descending colon. The polyp was sessile.       The polyp was removed with a cold snare. Resection and retrieval were       complete.      Two sessile polyps were found in the sigmoid colon. The polyps were 3 to       6 mm in size. These polyps were removed with a cold snare. Resection and       retrieval were complete.      The exam was otherwise without abnormality on direct and retroflexion       views. Impression:               - One 8 mm polyp in the cecum, removed with a cold                            snare. Resected and retrieved.                           - One 5 mm polyp in the descending colon, removed                            with a cold snare. Resected and retrieved.                           - Two 3 to 6 mm  polyps in the sigmoid colon,                            removed with a cold snare. Resected and retrieved.                           - The examination was otherwise normal on direct                            and retroflexion views. Moderate Sedation:      N/A Recommendation:           - Patient has a contact number available for                            emergencies. The signs and symptoms of potential                            delayed complications were discussed with the                            patient. Return to normal activities tomorrow.                            Written discharge instructions were provided to the                            patient.                           - Resume previous diet.                           -  Continue present medications.                           - Await pathology results.                           - Repeat colonoscopy is recommended for                            surveillance. The colonoscopy date will be                            determined after pathology results from today's                            exam become available for review. Procedure Code(s):        --- Professional ---                           [phone], Colonoscopy, flexible; with removal of                            tumor(s), polyp(s), or other lesion(s) by snare                            technique Diagnosis Code(s):        --- Professional ---                           K63.5, Polyp of colon                           Z86.010, Personal history of colonic polyps CPT copyright 2019 American Medical Association. All rights reserved. The codes documented in this report are preliminary and upon coder review may  be revised to meet current compliance requirements. Jerene Bears, MD 10/06/2021 9:18:49 AM This report has been signed electronically. Number of Addenda: 0

## 2021-10-07 ENCOUNTER — Encounter (HOSPITAL_COMMUNITY): Payer: Self-pay | Admitting: Internal Medicine

## 2021-10-07 LAB — SURGICAL PATHOLOGY

## 2021-10-10 ENCOUNTER — Encounter: Payer: Self-pay | Admitting: Internal Medicine

## 2021-10-14 ENCOUNTER — Other Ambulatory Visit: Payer: Self-pay | Admitting: Internal Medicine

## 2021-10-22 ENCOUNTER — Encounter: Payer: Self-pay | Admitting: Podiatry

## 2021-10-22 ENCOUNTER — Ambulatory Visit (INDEPENDENT_AMBULATORY_CARE_PROVIDER_SITE_OTHER): Payer: 59 | Admitting: Podiatry

## 2021-10-22 DIAGNOSIS — B351 Tinea unguium: Secondary | ICD-10-CM | POA: Diagnosis not present

## 2021-10-22 DIAGNOSIS — E1122 Type 2 diabetes mellitus with diabetic chronic kidney disease: Secondary | ICD-10-CM

## 2021-10-22 DIAGNOSIS — N185 Chronic kidney disease, stage 5: Secondary | ICD-10-CM

## 2021-10-22 DIAGNOSIS — M79676 Pain in unspecified toe(s): Secondary | ICD-10-CM

## 2021-10-22 DIAGNOSIS — Z794 Long term (current) use of insulin: Secondary | ICD-10-CM

## 2021-10-22 NOTE — Progress Notes (Signed)
This patient returns to my office for at risk foot care.  This patient requires this care by a professional since this patient will be at risk due to having diabetes,venous stasis and CKD.  This patient is unable to cut nails himself since the patient cannot reach his nails.These nails are painful walking and wearing shoes. He had surgery for correction left forefoot callus. This patient presents for at risk foot care today.  General Appearance  Alert, conversant and in no acute stress.  Vascular  Dorsalis pedis and posterior tibial  pulses are palpable  bilaterally.  Capillary return is within normal limits  bilaterally. Temperature is within normal limits  bilaterally.  Neurologic  Senn-Weinstein monofilament wire test diminished  bilaterally. Muscle power within normal limits bilaterally.  Nails Thick disfigured discolored nails with subungual debris  from hallux to fifth toes bilaterally. No evidence of bacterial infection or drainage bilaterally.  Orthopedic  No limitations of motion  feet .  No crepitus or effusions noted.  No bony pathology or digital deformities noted.  Skin  normotropic skin noted bilaterally.  No signs of infections or ulcers noted.   Asymptomatic sub 5 right.    Onychomycosis  Pain in right toes  Pain in left toes    Consent was obtained for treatment procedures.   Mechanical debridement of nails 1-5  bilaterally performed with a nail nipper.  Filed with dremel without incident.    Return office visit   12  weeks                  Told patient to return for periodic foot care and evaluation due to potential at risk complications.   Maxwel Meadowcroft DPM   

## 2021-10-31 ENCOUNTER — Other Ambulatory Visit: Payer: Self-pay | Admitting: Internal Medicine

## 2021-10-31 DIAGNOSIS — E1142 Type 2 diabetes mellitus with diabetic polyneuropathy: Secondary | ICD-10-CM

## 2021-12-10 ENCOUNTER — Encounter: Payer: 59 | Admitting: Internal Medicine

## 2021-12-15 ENCOUNTER — Other Ambulatory Visit: Payer: Self-pay | Admitting: Internal Medicine

## 2021-12-16 ENCOUNTER — Encounter: Payer: Self-pay | Admitting: Internal Medicine

## 2021-12-16 ENCOUNTER — Ambulatory Visit (INDEPENDENT_AMBULATORY_CARE_PROVIDER_SITE_OTHER): Payer: 59 | Admitting: Internal Medicine

## 2021-12-16 VITALS — BP 122/72 | HR 66 | Temp 97.8°F | Ht 71.5 in | Wt 253.0 lb

## 2021-12-16 DIAGNOSIS — N186 End stage renal disease: Secondary | ICD-10-CM | POA: Diagnosis not present

## 2021-12-16 DIAGNOSIS — Z0000 Encounter for general adult medical examination without abnormal findings: Secondary | ICD-10-CM | POA: Diagnosis not present

## 2021-12-16 DIAGNOSIS — E1122 Type 2 diabetes mellitus with diabetic chronic kidney disease: Secondary | ICD-10-CM

## 2021-12-16 DIAGNOSIS — N185 Chronic kidney disease, stage 5: Secondary | ICD-10-CM

## 2021-12-16 DIAGNOSIS — G4733 Obstructive sleep apnea (adult) (pediatric): Secondary | ICD-10-CM

## 2021-12-16 DIAGNOSIS — Z794 Long term (current) use of insulin: Secondary | ICD-10-CM

## 2021-12-16 DIAGNOSIS — I5032 Chronic diastolic (congestive) heart failure: Secondary | ICD-10-CM

## 2021-12-16 NOTE — Assessment & Plan Note (Signed)
Compensated with metoprolol 100 bi, losartan 100 daily, amlodipine 10, torsemide '100mg'$  dialy

## 2021-12-16 NOTE — Assessment & Plan Note (Signed)
Doing well Adjusting to retirement Flu vaccine today COVID at pharmacy Consider shingrix at pharmacy Colon due in 3 years Discussed PSA with blood work by nephrologist (and lipid)

## 2021-12-16 NOTE — Assessment & Plan Note (Signed)
Uses nightly with good effect

## 2021-12-16 NOTE — Assessment & Plan Note (Signed)
Doing well with peritoneal dialysis Is on transplant list

## 2021-12-16 NOTE — Assessment & Plan Note (Signed)
Lab Results  Component Value Date   HGBA1C 6.9 (A) 09/22/2021   Good control on semaglutide '1mg'$  weekly, lantus 65 daily, humalog 10-20 at meals
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
## 2022-01-06 ENCOUNTER — Other Ambulatory Visit: Payer: Self-pay | Admitting: Internal Medicine

## 2022-01-20 ENCOUNTER — Ambulatory Visit (INDEPENDENT_AMBULATORY_CARE_PROVIDER_SITE_OTHER): Payer: 59 | Admitting: Podiatry

## 2022-01-20 ENCOUNTER — Encounter: Payer: Self-pay | Admitting: Podiatry

## 2022-01-20 DIAGNOSIS — N185 Chronic kidney disease, stage 5: Secondary | ICD-10-CM

## 2022-01-20 DIAGNOSIS — M79676 Pain in unspecified toe(s): Secondary | ICD-10-CM

## 2022-01-20 DIAGNOSIS — B351 Tinea unguium: Secondary | ICD-10-CM | POA: Diagnosis not present

## 2022-01-20 DIAGNOSIS — E1122 Type 2 diabetes mellitus with diabetic chronic kidney disease: Secondary | ICD-10-CM

## 2022-01-20 DIAGNOSIS — I872 Venous insufficiency (chronic) (peripheral): Secondary | ICD-10-CM

## 2022-01-20 DIAGNOSIS — Z794 Long term (current) use of insulin: Secondary | ICD-10-CM

## 2022-01-20 NOTE — Progress Notes (Signed)
This patient returns to my office for at risk foot care.  This patient requires this care by a professional since this patient will be at risk due to having diabetes,venous stasis and CKD.  This patient is unable to cut nails himself since the patient cannot reach his nails.These nails are painful walking and wearing shoes. He had surgery for correction left forefoot callus. This patient presents for at risk foot care today.  General Appearance  Alert, conversant and in no acute stress.  Vascular  Dorsalis pedis and posterior tibial  pulses are palpable  bilaterally.  Capillary return is within normal limits  bilaterally. Temperature is within normal limits  bilaterally.  Neurologic  Senn-Weinstein monofilament wire test diminished  bilaterally. Muscle power within normal limits bilaterally.  Nails Thick disfigured discolored nails with subungual debris  from hallux to fifth toes bilaterally. No evidence of bacterial infection or drainage bilaterally.  Orthopedic  No limitations of motion  feet .  No crepitus or effusions noted.  No bony pathology or digital deformities noted.  Skin  normotropic skin noted bilaterally.  No signs of infections or ulcers noted.   Asymptomatic sub 5 right.    Onychomycosis  Pain in right toes  Pain in left toes    Consent was obtained for treatment procedures.   Mechanical debridement of nails 1-5  bilaterally performed with a nail nipper.  Filed with dremel without incident.    Return office visit   12  weeks                  Told patient to return for periodic foot care and evaluation due to potential at risk complications.   Gardiner Barefoot DPM

## 2022-01-21 ENCOUNTER — Encounter: Payer: Self-pay | Admitting: Internal Medicine

## 2022-01-21 ENCOUNTER — Ambulatory Visit (INDEPENDENT_AMBULATORY_CARE_PROVIDER_SITE_OTHER): Payer: 59 | Admitting: Internal Medicine

## 2022-01-21 VITALS — BP 132/90 | HR 72 | Ht 71.5 in | Wt 259.6 lb

## 2022-01-21 DIAGNOSIS — E785 Hyperlipidemia, unspecified: Secondary | ICD-10-CM

## 2022-01-21 DIAGNOSIS — E1165 Type 2 diabetes mellitus with hyperglycemia: Secondary | ICD-10-CM

## 2022-01-21 DIAGNOSIS — E1142 Type 2 diabetes mellitus with diabetic polyneuropathy: Secondary | ICD-10-CM

## 2022-01-21 DIAGNOSIS — E669 Obesity, unspecified: Secondary | ICD-10-CM | POA: Diagnosis not present

## 2022-01-21 LAB — POCT GLYCOSYLATED HEMOGLOBIN (HGB A1C): Hemoglobin A1C: 6.8 % — AB (ref 4.0–5.6)

## 2022-01-21 MED ORDER — OZEMPIC (2 MG/DOSE) 8 MG/3ML ~~LOC~~ SOPN: 2.0000 mg | PEN_INJECTOR | SUBCUTANEOUS | 3 refills | Status: DC

## 2022-01-21 NOTE — Patient Instructions (Addendum)
Please continue: - Lantus 70 units daily at bedtime  Change: - Humalog  10 units before b'fast  20 units before lunch or brunch 20-24 units before dinner  Increase: - Ozempic 2 mg weekly   Please return in 3-4 months.
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
## 2022-01-22 ENCOUNTER — Ambulatory Visit: Payer: 59 | Admitting: Internal Medicine

## 2022-02-04 ENCOUNTER — Other Ambulatory Visit: Payer: Self-pay | Admitting: Internal Medicine

## 2022-02-04 DIAGNOSIS — E1142 Type 2 diabetes mellitus with diabetic polyneuropathy: Secondary | ICD-10-CM

## 2022-04-01 ENCOUNTER — Other Ambulatory Visit: Payer: Self-pay | Admitting: Internal Medicine

## 2022-04-12 ENCOUNTER — Other Ambulatory Visit: Payer: Self-pay | Admitting: Internal Medicine

## 2022-04-21 ENCOUNTER — Ambulatory Visit: Payer: 59 | Admitting: Podiatry

## 2022-05-06 ENCOUNTER — Ambulatory Visit: Payer: 59 | Admitting: Podiatry

## 2022-05-08 ENCOUNTER — Other Ambulatory Visit: Payer: Self-pay | Admitting: Internal Medicine

## 2022-05-08 DIAGNOSIS — E1165 Type 2 diabetes mellitus with hyperglycemia: Secondary | ICD-10-CM

## 2022-05-12 ENCOUNTER — Encounter: Payer: Self-pay | Admitting: Internal Medicine

## 2022-05-12 ENCOUNTER — Other Ambulatory Visit: Payer: Self-pay | Admitting: Internal Medicine

## 2022-05-12 ENCOUNTER — Ambulatory Visit (INDEPENDENT_AMBULATORY_CARE_PROVIDER_SITE_OTHER): Payer: 59 | Admitting: Internal Medicine

## 2022-05-12 VITALS — BP 138/82 | HR 71 | Ht 71.5 in | Wt 250.4 lb

## 2022-05-12 DIAGNOSIS — Z992 Dependence on renal dialysis: Secondary | ICD-10-CM

## 2022-05-12 DIAGNOSIS — E669 Obesity, unspecified: Secondary | ICD-10-CM | POA: Diagnosis not present

## 2022-05-12 DIAGNOSIS — E1122 Type 2 diabetes mellitus with diabetic chronic kidney disease: Secondary | ICD-10-CM | POA: Diagnosis not present

## 2022-05-12 DIAGNOSIS — E785 Hyperlipidemia, unspecified: Secondary | ICD-10-CM

## 2022-05-12 DIAGNOSIS — N186 End stage renal disease: Secondary | ICD-10-CM | POA: Diagnosis not present

## 2022-05-12 DIAGNOSIS — Z794 Long term (current) use of insulin: Secondary | ICD-10-CM

## 2022-05-12 LAB — POCT GLYCOSYLATED HEMOGLOBIN (HGB A1C): Hemoglobin A1C: 6.6 % — AB (ref 4.0–5.6)

## 2022-05-12 MED ORDER — BASAGLAR KWIKPEN 100 UNIT/ML ~~LOC~~ SOPN: 70.0000 [IU] | PEN_INJECTOR | Freq: Every day | SUBCUTANEOUS | 3 refills | Status: DC

## 2022-05-12 NOTE — Patient Instructions (Addendum)
Please continue: - Lantus/Basaglar 70 units daily at bedtime - Ozempic 2 mg weekly   Use: - Humalog  10 units before b'fast  15-20 units before lunch or brunch 15-24 units before dinner  Please return in 4 months.

## 2022-05-12 NOTE — Telephone Encounter (Signed)
Pharmacy contacted and confirmed Lantus is covered and ready for pick up for pt.
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
## 2022-05-12 NOTE — Addendum Note (Signed)
Addended by: Kenyon Ana on: 05/12/2022 12:45 PM   Modules accepted: Orders

## 2022-05-25 ENCOUNTER — Other Ambulatory Visit: Payer: Self-pay | Admitting: Internal Medicine

## 2022-06-28 ENCOUNTER — Other Ambulatory Visit: Payer: Self-pay | Admitting: Internal Medicine

## 2022-06-28 DIAGNOSIS — E1142 Type 2 diabetes mellitus with diabetic polyneuropathy: Secondary | ICD-10-CM

## 2022-07-10 ENCOUNTER — Other Ambulatory Visit: Payer: Self-pay

## 2022-07-10 MED ORDER — BD PEN NEEDLE NANO 2ND GEN 32G X 4 MM MISC: 5 refills | Status: AC

## 2022-07-14 ENCOUNTER — Other Ambulatory Visit: Payer: Self-pay | Admitting: Internal Medicine

## 2022-07-23 ENCOUNTER — Other Ambulatory Visit: Payer: Self-pay | Admitting: Internal Medicine

## 2022-07-23 DIAGNOSIS — E1165 Type 2 diabetes mellitus with hyperglycemia: Secondary | ICD-10-CM

## 2022-08-11 ENCOUNTER — Other Ambulatory Visit: Payer: Self-pay | Admitting: Internal Medicine

## 2022-08-11 DIAGNOSIS — E1142 Type 2 diabetes mellitus with diabetic polyneuropathy: Secondary | ICD-10-CM

## 2022-09-01 LAB — HM DIABETES EYE EXAM

## 2022-09-18 ENCOUNTER — Ambulatory Visit: Payer: 59 | Admitting: Internal Medicine
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
## 2022-09-22 ENCOUNTER — Ambulatory Visit: Payer: Medicare Other | Admitting: Internal Medicine

## 2022-09-22 ENCOUNTER — Encounter: Payer: Self-pay | Admitting: Internal Medicine

## 2022-09-22 VITALS — BP 138/70 | HR 73 | Ht 71.5 in | Wt 252.6 lb

## 2022-09-22 DIAGNOSIS — Z992 Dependence on renal dialysis: Secondary | ICD-10-CM

## 2022-09-22 DIAGNOSIS — Z794 Long term (current) use of insulin: Secondary | ICD-10-CM

## 2022-09-22 DIAGNOSIS — E1122 Type 2 diabetes mellitus with diabetic chronic kidney disease: Secondary | ICD-10-CM

## 2022-09-22 DIAGNOSIS — N186 End stage renal disease: Secondary | ICD-10-CM | POA: Diagnosis not present

## 2022-09-22 DIAGNOSIS — Z7985 Long-term (current) use of injectable non-insulin antidiabetic drugs: Secondary | ICD-10-CM

## 2022-09-22 DIAGNOSIS — E785 Hyperlipidemia, unspecified: Secondary | ICD-10-CM | POA: Diagnosis not present

## 2022-09-22 NOTE — Patient Instructions (Addendum)
Please continue: - Lantus 70 units daily at bedtime - Ozempic 2 mg weekly  - Humalog  10 units before b'fast  15-20 units before lunch or brunch 15-24 units before dinner  Please return in 4 months.
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
## 2022-10-12 ENCOUNTER — Other Ambulatory Visit: Payer: Self-pay | Admitting: Internal Medicine

## 2022-10-13 ENCOUNTER — Telehealth: Payer: Self-pay

## 2022-10-13 MED ORDER — FREESTYLE LIBRE 3 PLUS SENSOR MISC: 1.0000 | 3 refills | Status: DC

## 2022-10-13 NOTE — Telephone Encounter (Signed)
Received notification that Samuel Reed 3 is not longer being made and we need to send in [place] 3 Plus   Requested Prescriptions   Signed Prescriptions Disp Refills   Continuous Glucose Sensor (FREESTYLE LIBRE 3 PLUS SENSOR) MISC 6 each 3    Sig: Inject 1 Device into the skin continuous. Change every 15 days    Authorizing Provider: Carlus Pavlov    Ordering User: Pollie Meyer

## 2022-10-20 ENCOUNTER — Telehealth: Payer: Self-pay

## 2022-10-20 ENCOUNTER — Other Ambulatory Visit (HOSPITAL_COMMUNITY): Payer: Self-pay

## 2022-10-20 NOTE — Telephone Encounter (Signed)
Pharmacy Patient Advocate Encounter   Received notification from Pt Calls Messages that prior authorization for Freestyle libre 3 plus is required/requested.  Per test claim: PA required; PA submitted to [facility] via CoverMyMeds Key/confirmation #/EOC [id number] Status is pending

## 2022-10-20 NOTE — Telephone Encounter (Signed)
Sample  Device: FreeStyle Libre 3 Plus Quantity:1 sensor ZOX:[id number] EXP:02/26/2023  Dicie Beam

## 2022-10-20 NOTE — Telephone Encounter (Signed)
Patient needs a PA for [employer] 3 Plus

## 2022-10-27 NOTE — Telephone Encounter (Signed)
Pharmacy Patient Advocate Encounter  Received notification from [facility] that Prior Authorization for [facility] 3 plus sensor has been APPROVED through 10/20/2023   PA #/Case ID/Reference #: [id number]

## 2022-12-22 ENCOUNTER — Encounter: Payer: Self-pay | Admitting: Internal Medicine

## 2022-12-22 ENCOUNTER — Ambulatory Visit: Payer: 59 | Admitting: Internal Medicine

## 2022-12-22 VITALS — BP 110/70 | HR 77 | Temp 98.4°F | Ht 72.0 in | Wt 245.0 lb

## 2022-12-22 DIAGNOSIS — E1122 Type 2 diabetes mellitus with diabetic chronic kidney disease: Secondary | ICD-10-CM

## 2022-12-22 DIAGNOSIS — Z0000 Encounter for general adult medical examination without abnormal findings: Secondary | ICD-10-CM | POA: Diagnosis not present

## 2022-12-22 DIAGNOSIS — N186 End stage renal disease: Secondary | ICD-10-CM | POA: Diagnosis not present

## 2022-12-22 DIAGNOSIS — I5032 Chronic diastolic (congestive) heart failure: Secondary | ICD-10-CM

## 2022-12-22 DIAGNOSIS — Z794 Long term (current) use of insulin: Secondary | ICD-10-CM

## 2022-12-22 DIAGNOSIS — Z992 Dependence on renal dialysis: Secondary | ICD-10-CM

## 2022-12-22 LAB — HM DIABETES FOOT EXAM

## 2022-12-22 NOTE — Assessment & Plan Note (Signed)
Compensated now with the dialysis
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
## 2022-12-22 NOTE — Assessment & Plan Note (Signed)
Doing well Recommended updated COVID as well as one time RSV vaccines Colon due 2026 Recent PSA normal Add resistance training for legs

## 2022-12-22 NOTE — Assessment & Plan Note (Signed)
On ozempic 2mg  weekly, lantus 65 units daily, sliding scale lispro (10-20)

## 2022-12-22 NOTE — Assessment & Plan Note (Signed)
Still doing well with peritoneal dialysis Awaiting kidney transplant

## 2023-01-08 ENCOUNTER — Other Ambulatory Visit: Payer: Self-pay | Admitting: Internal Medicine

## 2023-01-08 DIAGNOSIS — E1142 Type 2 diabetes mellitus with diabetic polyneuropathy: Secondary | ICD-10-CM

## 2023-01-09 ENCOUNTER — Other Ambulatory Visit: Payer: Self-pay | Admitting: Internal Medicine

## 2023-01-11 ENCOUNTER — Other Ambulatory Visit: Payer: Self-pay | Admitting: Internal Medicine

## 2023-01-11 ENCOUNTER — Encounter: Payer: Self-pay | Admitting: Internal Medicine

## 2023-01-19 ENCOUNTER — Emergency Department (HOSPITAL_BASED_OUTPATIENT_CLINIC_OR_DEPARTMENT_OTHER)
Admission: EM | Admit: 2023-01-19 | Discharge: 2023-01-19 | Disposition: A | Discharge status: Home / Self Care | Payer: Medicare Other | Attending: Emergency Medicine | Admitting: Emergency Medicine

## 2023-01-19 ENCOUNTER — Emergency Department (HOSPITAL_BASED_OUTPATIENT_CLINIC_OR_DEPARTMENT_OTHER): Payer: Medicare Other

## 2023-01-19 ENCOUNTER — Encounter (HOSPITAL_BASED_OUTPATIENT_CLINIC_OR_DEPARTMENT_OTHER): Payer: Self-pay | Admitting: Emergency Medicine

## 2023-01-19 ENCOUNTER — Other Ambulatory Visit: Payer: Self-pay

## 2023-01-19 DIAGNOSIS — M7989 Other specified soft tissue disorders: Secondary | ICD-10-CM | POA: Diagnosis present

## 2023-01-19 DIAGNOSIS — Z992 Dependence on renal dialysis: Secondary | ICD-10-CM | POA: Insufficient documentation

## 2023-01-19 DIAGNOSIS — L02611 Cutaneous abscess of right foot: Secondary | ICD-10-CM | POA: Diagnosis not present

## 2023-01-19 DIAGNOSIS — Z794 Long term (current) use of insulin: Secondary | ICD-10-CM | POA: Diagnosis not present

## 2023-01-19 DIAGNOSIS — E119 Type 2 diabetes mellitus without complications: Secondary | ICD-10-CM | POA: Insufficient documentation

## 2023-01-19 MED ORDER — DOXYCYCLINE HYCLATE 100 MG PO TABS
100.0000 mg | ORAL_TABLET | Freq: Once | ORAL | Status: AC
  Administered 2023-01-19: 100 mg via ORAL
  Filled 2023-01-19: qty 1

## 2023-01-19 MED ORDER — DOXYCYCLINE HYCLATE 100 MG PO CAPS: 100.0000 mg | ORAL_CAPSULE | Freq: Two times a day (BID) | ORAL | 0 refills | Status: DC

## 2023-01-19 MED ORDER — LIDOCAINE HCL 2 % IJ SOLN
10.0000 mL | Freq: Once | INTRAMUSCULAR | Status: DC
  Filled 2023-01-19: qty 20

## 2023-01-19 NOTE — ED Provider Notes (Signed)
[facility] Provider Note   CSN: [id number] Arrival date & time: 01/19/23  2108     History  Chief Complaint  Patient presents with   Abscess    Samuel Reed is a 62 y.o. male history of diabetes, peritoneal dialysis here presenting with right 5th toe swelling.  Patient states that he noticed swelling on the base of the fifth toe for about a week.  He states that he has neuropathy so he does not feel that area is much.  Patient states that he noticed the swelling did not go down so wants to be checked out.  Patient denies any purulent discharge.  Denies any redness in the area.  Denies any fevers or chills.  He states that he does not get recurrent foot infections.  The history is provided by the patient.       Home Medications Prior to Admission medications   Medication Sig Start Date End Date Taking? Authorizing Provider  amLODipine (NORVASC) 10 MG tablet TAKE 1 TABLET BY MOUTH EVERY DAY 01/06/22   Karie Schwalbe, MD  cholecalciferol (VITAMIN D) 25 MCG (1000 UNIT) tablet Take 2,000 Units by mouth daily with breakfast.    [provider]  Continuous Glucose Sensor (FREESTYLE LIBRE 3 PLUS SENSOR) MISC Inject 1 Device into the skin continuous. Change every 15 days 10/13/22   Carlus Pavlov, MD  Continuous Glucose Sensor (FREESTYLE LIBRE 3 SENSOR) MISC USE 1 SENSOR EVERY 14 DAYS 10/12/22   Carlus Pavlov, MD  fluticasone (FLONASE) 50 MCG/ACT nasal spray PLACE 1 SPRAY INTO BOTH NOSTRILS 2 (TWO) TIMES DAILY. Patient taking differently: Place 2 sprays into both nostrils 2 (two) times daily. 09/26/18   Karie Schwalbe, MD  gentamicin cream (GARAMYCIN) 0.1 % Apply 1 application topically 2 (two) times daily. Patient taking differently: Apply 1 application  topically daily. 02/26/21   Felecia Shelling, DPM  insulin glargine (LANTUS SOLOSTAR) 100 UNIT/ML Solostar Pen INJECT 55 UNITS INTO THE SKIN DAILY. 01/08/23   Carlus Pavlov, MD  insulin lispro (HUMALOG KWIKPEN) 100 UNIT/ML KwikPen INJECT 10-20 UNITS TOTAL INTO THE SKIN 3 (THREE) TIMES DAILY BEFORE MEALS. 07/14/22   Carlus Pavlov, MD  Insulin Pen Needle (BD PEN NEEDLE NANO 2ND GEN) 32G X 4 MM MISC USE TO INJECT INSULIN 4 TIMES A DAY 07/10/22   Carlus Pavlov, MD  losartan (COZAAR) 100 MG tablet Take 100 mg by mouth at bedtime. 06/16/21   [provider]  metoprolol tartrate (LOPRESSOR) 100 MG tablet TAKE 1 TABLET BY MOUTH TWICE A DAY 01/06/22   Tillman Abide I, MD  potassium chloride SA (KLOR-CON M) 20 MEQ tablet Take 20 mEq by mouth at bedtime.    [provider]  PRESCRIPTION MEDICATION CPAP: At bedtime    [provider]  rosuvastatin (CRESTOR) 20 MG tablet TAKE 1 TABLET BY MOUTH EVERY DAY 01/06/22   Tillman Abide I, MD  Semaglutide, 2 MG/DOSE, (OZEMPIC, 2 MG/DOSE,) 8 MG/3ML SOPN INJECT 2 MG INTO THE SKIN ONCE A WEEK. 01/11/23   Carlus Pavlov, MD  sevelamer carbonate (RENVELA) 800 MG tablet Take 800 mg by mouth 3 (three) times daily with meals. 09/23/21   [provider]  torsemide (DEMADEX) 100 MG tablet Take 100 mg by mouth daily. 04/22/21   [provider]      Allergies    Patient has no known allergies.    Review of Systems   Review of Systems  Musculoskeletal:  Right foot swelling  All other systems reviewed and are negative.   Physical Exam Updated Vital Signs BP (!) 148/70 (BP Location: Left Arm)   Pulse 79   Temp 98.2 F (36.8 C)   Resp 18   Ht 5\' 11"  (1.803 m)   Wt 111.1 kg   SpO2 100%   BMI 34.17 kg/m  Physical Exam Vitals and nursing note reviewed.  Constitutional:      Comments: Chronically ill but not acutely ill  HENT:     Head: Normocephalic.     Nose: Nose normal.     Mouth/Throat:     Mouth: Mucous membranes are moist.  Eyes:     Pupils: Pupils are equal, round, and reactive to light.  Cardiovascular:     Rate and Rhythm: Normal rate.     Pulses:  Normal pulses.  Pulmonary:     Effort: Pulmonary effort is normal.  Abdominal:     General: Abdomen is flat.  Musculoskeletal:     Cervical back: Normal range of motion.     Comments: Patient has an area of swelling at the right fifth MTP joint.  There is a callus at the base of the joint and there appears to be some swelling around it.  There is no obvious purulent discharge and no obvious cellulitis.  Skin:    General: Skin is warm.     Capillary Refill: Capillary refill takes less than 2 seconds.  Neurological:     General: No focal deficit present.     Mental Status: He is alert and oriented to person, place, and time.  Psychiatric:        Mood and Affect: Mood normal.        Behavior: Behavior normal.     ED Results / Procedures / Treatments   Labs (all labs ordered are listed, but only abnormal results are displayed) Labs Reviewed - No data to display  EKG None  Radiology No results found.  Procedures Procedures    INCISION AND DRAINAGE Performed by: Richardean Canal Consent: Verbal consent obtained. Risks and benefits: risks, benefits and alternatives were discussed Type: abscess  Body area: Lateral aspect R foot  Anesthesia: local infiltration  Incision was made with a scalpel.  Local anesthetic: lidocaine 2 % no epinephrine  Anesthetic total: 5 ml  Complexity: complex Blunt dissection to break up loculations  Drainage:clear   Drainage amount: moderate   Packing material: none  Patient tolerance: Patient tolerated the procedure well with no immediate complications.    Medications Ordered in ED Medications - No data to display  ED Course/ Medical Decision Making/ A&P                                 Medical Decision Making Samuel Reed is a 62 y.o. male here presenting with swelling of the right fifth MTP joint.  I wonder if this is inflammatory change from repetitive injury versus small abscess versus underlying fracture.  Patient's glucose  is 151 on his libre.  Patient is afebrile well-appearing.  Will get x-ray to rule out osteomyelitis.  10:03 PM X-ray showed possible abscess but no obvious osteomyelitis.  I was able to clean up the area perform I&D.  It is mostly clear fluid and no obvious purulent drainage.  Patient does have an ulcer underneath it.  Patient has podiatry follow-up with Dr. Logan Bores.  Will give a course of doxycycline  and have him follow-up with podiatry  Problems Addressed: Foot abscess, right: acute illness or injury  Amount and/or Complexity of Data Reviewed Radiology: ordered and independent interpretation performed. Decision-making details documented in ED Course.  Risk Prescription drug management.    Final Clinical Impression(s) / ED Diagnoses Final diagnoses:  None    Rx / DC Orders ED Discharge Orders     None         Charlynne Pander, MD 01/19/23 2205

## 2023-01-19 NOTE — Discharge Instructions (Signed)
I was able to drain the abscess or cyst.  Please keep the dressing on until tomorrow.  You may change it tomorrow and just keep it covered.  I have prescribed doxycycline twice a day for a week  I recommend you follow-up with your podiatrist as you have a foot ulcer in that area  Return to ER if you have worse foot swelling or purulent discharge or fever

## 2023-01-19 NOTE — ED Triage Notes (Signed)
Pt reports nickel sized abscess to RT lateral foot, abscess is soft to the touch and intact, hx DM2 with peripheral neuropathy and ESRD w peritoneal dialysis; denies any fever or sxs of infection

## 2023-01-22 ENCOUNTER — Ambulatory Visit (INDEPENDENT_AMBULATORY_CARE_PROVIDER_SITE_OTHER): Payer: Medicare Other | Admitting: Internal Medicine

## 2023-01-22 ENCOUNTER — Encounter: Payer: Self-pay | Admitting: Internal Medicine

## 2023-01-22 VITALS — BP 120/70 | HR 68 | Ht 71.0 in | Wt 247.4 lb

## 2023-01-22 DIAGNOSIS — E1122 Type 2 diabetes mellitus with diabetic chronic kidney disease: Secondary | ICD-10-CM | POA: Diagnosis not present

## 2023-01-22 DIAGNOSIS — Z992 Dependence on renal dialysis: Secondary | ICD-10-CM

## 2023-01-22 DIAGNOSIS — E1165 Type 2 diabetes mellitus with hyperglycemia: Secondary | ICD-10-CM

## 2023-01-22 DIAGNOSIS — E66811 Obesity, class 1: Secondary | ICD-10-CM

## 2023-01-22 DIAGNOSIS — Z794 Long term (current) use of insulin: Secondary | ICD-10-CM | POA: Diagnosis not present

## 2023-01-22 DIAGNOSIS — N186 End stage renal disease: Secondary | ICD-10-CM | POA: Diagnosis not present

## 2023-01-22 DIAGNOSIS — E785 Hyperlipidemia, unspecified: Secondary | ICD-10-CM

## 2023-01-22 DIAGNOSIS — E1142 Type 2 diabetes mellitus with diabetic polyneuropathy: Secondary | ICD-10-CM | POA: Diagnosis not present

## 2023-01-22 MED ORDER — TIRZEPATIDE 2.5 MG/0.5ML ~~LOC~~ SOAJ: 2.5000 mg | SUBCUTANEOUS | 5 refills | Status: DC

## 2023-01-22 MED ORDER — LANTUS SOLOSTAR 100 UNIT/ML ~~LOC~~ SOPN: 65.0000 [IU] | PEN_INJECTOR | Freq: Every day | SUBCUTANEOUS | Status: DC

## 2023-01-22 NOTE — Patient Instructions (Addendum)
Please continue: - Lantus 65 units daily at bedtime - Humalog  10 units before b'fast  15-20 units before lunch or brunch 15-24 units before dinner  Try to start: - Mounjaro 2.5 mg weekly - after 3 weeks, let me know if we can increase the dose.  Please return in 4 months.
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
## 2023-01-27 ENCOUNTER — Other Ambulatory Visit: Payer: Self-pay | Admitting: Internal Medicine

## 2023-01-28 ENCOUNTER — Other Ambulatory Visit: Payer: Self-pay | Admitting: Internal Medicine

## 2023-02-03 ENCOUNTER — Ambulatory Visit: Payer: 59 | Admitting: Podiatry

## 2023-02-03 DIAGNOSIS — L97512 Non-pressure chronic ulcer of other part of right foot with fat layer exposed: Secondary | ICD-10-CM | POA: Diagnosis not present

## 2023-02-03 DIAGNOSIS — E0843 Diabetes mellitus due to underlying condition with diabetic autonomic (poly)neuropathy: Secondary | ICD-10-CM | POA: Diagnosis not present

## 2023-02-03 NOTE — Progress Notes (Signed)
 Chief Complaint  Patient presents with   Callouses    He repots that he has had a callus on the right foot x 2 weeks that is causing pain and was on Doxycycline  a few weeks ago to prevent infection.     Subjective:  63 y.o. male with PMHx of diabetes mellitus presenting today for evaluation of a symptomatic callus to the plantar aspect of the right foot.  Patient states he has noticed it for few months now.  He was seen at the emergency department where there was a blister development around the area and it was lanced.  He was placed on oral doxycycline .  He has since completed his oral doxycycline  presents for further treatment and evaluation   Past Medical History:  Diagnosis Date   Allergy    Anemia    CHF (congestive heart failure) (HCC)    Chronic venous insufficiency    CKD (chronic kidney disease), stage III (HCC)    Complication of anesthesia    patient was still awake when he was being intubated   Diabetes mellitus type II 2003   HLD (hyperlipidemia)    HTN (hypertension) 1980's   Neuromuscular disorder (HCC)    neuropathy feet    OSA (obstructive sleep apnea)    Pericardial effusion    S/P pericardial window 01/16/2019   Sleep apnea    wear cpap     Past Surgical History:  Procedure Laterality Date   Bone removal from back  1987   AV FISTULA PLACEMENT Right 04/02/2021   Procedure: RIGHT RADIOCEPHALIC  ARTERIOVENOUS (AV) FISTULA CREATION;  Surgeon: Serene Gaile ORN, MD;  Location: [facility] OR;  Service: Vascular;  Laterality: Right;   CAPD INSERTION N/A 04/02/2021   Procedure: LAPAROSCOPIC INSERTION CONTINUOUS AMBULATORY PERITONEAL DIALYSIS  (CAPD) CATHETER;  Surgeon: Serene Gaile ORN, MD;  Location: [facility] OR;  Service: Vascular;  Laterality: N/A;   CARDIAC CATHETERIZATION     CARDIAC CATHETERIZATION N/A 02/12/2015   Procedure: Left Heart Cath and Coronary Angiography;  Surgeon: Gordy Bergamo, MD;  Location: [facility] INVASIVE CV LAB;  Service: Cardiovascular;  Laterality: N/A;    COLONOSCOPY     COLONOSCOPY WITH PROPOFOL  N/A 10/06/2021   Procedure: COLONOSCOPY WITH PROPOFOL ;  Surgeon: Albertus Gordy HERO, MD;  Location: [facility];  Service: Gastroenterology;  Laterality: N/A;  CKD, on dialysis   KNEE CARTILAGE SURGERY     left knee   LUMBAR DISC SURGERY  1986   PERICARDIOCENTESIS N/A 01/16/2019   Procedure: PERICARDIOCENTESIS;  Surgeon: Burnard Debby LABOR, MD;  Location: [facility] INVASIVE CV LAB;  Service: Cardiovascular;  Laterality: N/A;   POLYPECTOMY     POLYPECTOMY  10/06/2021   Procedure: POLYPECTOMY;  Surgeon: Albertus Gordy HERO, MD;  Location: [facility];  Service: Gastroenterology;;   VIDEO ASSISTED THORACOSCOPY Right 01/17/2019   Procedure: VIDEO ASSISTED THORACOSCOPY, pericardial window for pericardium and pericardial fluid.;  Surgeon: Shyrl Linnie KIDD, MD;  Location: [facility];  Service: Thoracic;  Laterality: Right;    No Known Allergies   Objective/Physical Exam General: The patient is alert and oriented x3 in no acute distress.  Dermatology:  Wound #1 noted to the plantar aspect of the fifth MTP of the right foot measuring approximately 1.0 x 1.0 x 0.2 cm (LxWxD).   To the noted ulceration(s), there is no eschar. There is a moderate amount of slough, fibrin, and necrotic tissue noted. Granulation tissue and wound base is red. There is a minimal amount of serosanguineous drainage noted. There is no exposed  bone muscle-tendon ligament or joint. There is no malodor. Periwound integrity is intact. Skin is warm, dry and supple bilateral lower extremities.  Vascular: Palpable pedal pulses bilaterally. No edema or erythema noted. Capillary refill within normal limits.  Neurological: Light touch and protective threshold diminished bilaterally.   Musculoskeletal Exam: Range of motion within normal limits to all pedal and ankle joints bilateral. Muscle strength 5/5 in all groups bilateral.   Assessment: 1.  Ulcer plantar aspect of the fifth MTP right foot secondary to  diabetes mellitus 2. diabetes mellitus w/ peripheral neuropathy   Plan of Care:  -Patient was evaluated. -Medically necessary excisional debridement including subcutaneous tissue was performed using a tissue nipper and a chisel blade. Excisional debridement of all the necrotic nonviable tissue down to healthy bleeding viable tissue was performed with post-debridement measurements same as pre-. -The wound was cleansed and dry sterile dressing applied. -Offloading felt dancers pads were applied to the patient's new balance shoes to offload pressure from the fifth MTP -Return to clinic 3 weeks   Thresa EMERSON Sar, DPM [facility]  Dr. Thresa EMERSON Sar, DPM    [address] [place], [place] [address]                Office [phone]  Fax [phone]

## 2023-02-10 ENCOUNTER — Ambulatory Visit: Payer: 59 | Admitting: Podiatry

## 2023-02-15 ENCOUNTER — Telehealth: Payer: Self-pay

## 2023-02-15 NOTE — Telephone Encounter (Signed)
Pt called stating he has not had any side effects or issues with the Mounjaro 2.5 so he is wanting to increase to 5 mg. OK to send in?

## 2023-02-16 MED ORDER — TIRZEPATIDE 5 MG/0.5ML ~~LOC~~ SOAJ: 5.0000 mg | SUBCUTANEOUS | 3 refills | Status: DC

## 2023-02-16 NOTE — Telephone Encounter (Signed)
Requested Prescriptions   Signed Prescriptions Disp Refills   tirzepatide (MOUNJARO) 5 MG/0.5ML Pen 6 mL 3    Sig: Inject 5 mg into the skin once a week.    Authorizing Provider: Carlus Pavlov    Ordering User: Pollie Meyer

## 2023-02-24 ENCOUNTER — Encounter: Payer: Self-pay | Admitting: Podiatry

## 2023-02-24 ENCOUNTER — Ambulatory Visit (INDEPENDENT_AMBULATORY_CARE_PROVIDER_SITE_OTHER): Payer: Medicare Other | Admitting: Podiatry

## 2023-02-24 DIAGNOSIS — L97512 Non-pressure chronic ulcer of other part of right foot with fat layer exposed: Secondary | ICD-10-CM | POA: Diagnosis not present

## 2023-02-24 NOTE — Progress Notes (Signed)
Chief Complaint  Patient presents with   Wound Check    Patient states" he believes his wound is healing up find "    Subjective:  63 y.o. male with PMHx of diabetes mellitus presenting today for evaluation of a symptomatic callus to the plantar aspect of the right foot.  Patient is doing significantly better.  He says that the offloading dancers pads have improved the wound significantly.  No new complaints   Past Medical History:  Diagnosis Date   Allergy    Anemia    CHF (congestive heart failure) (HCC)    Chronic venous insufficiency    CKD (chronic kidney disease), stage III (HCC)    Complication of anesthesia    patient was still awake when he was being intubated   Diabetes mellitus type II 2003   HLD (hyperlipidemia)    HTN (hypertension) 1980's   Neuromuscular disorder (HCC)    neuropathy feet    OSA (obstructive sleep apnea)    Pericardial effusion    S/P pericardial window 01/16/2019   Sleep apnea    wear cpap     Past Surgical History:  Procedure Laterality Date   "Bone removal" from back  1987   AV FISTULA PLACEMENT Right 04/02/2021   Procedure: RIGHT RADIOCEPHALIC  ARTERIOVENOUS (AV) FISTULA CREATION;  Surgeon: Nada Libman, MD;  Location: [facility];  Service: Vascular;  Laterality: Right;   CAPD INSERTION N/A 04/02/2021   Procedure: LAPAROSCOPIC INSERTION CONTINUOUS AMBULATORY PERITONEAL DIALYSIS  (CAPD) CATHETER;  Surgeon: Nada Libman, MD;  Location: [facility];  Service: Vascular;  Laterality: N/A;   CARDIAC CATHETERIZATION     CARDIAC CATHETERIZATION N/A 02/12/2015   Procedure: Left Heart Cath and Coronary Angiography;  Surgeon: Yates Decamp, MD;  Location: [facility] INVASIVE CV LAB;  Service: Cardiovascular;  Laterality: N/A;   COLONOSCOPY     COLONOSCOPY WITH PROPOFOL N/A 10/06/2021   Procedure: COLONOSCOPY WITH PROPOFOL;  Surgeon: Beverley Fiedler, MD;  Location: [facility];  Service: Gastroenterology;  Laterality: N/A;  CKD, on dialysis   KNEE CARTILAGE SURGERY      left knee   LUMBAR DISC SURGERY  1986   PERICARDIOCENTESIS N/A 01/16/2019   Procedure: PERICARDIOCENTESIS;  Surgeon: Lennette Bihari, MD;  Location: [facility] INVASIVE CV LAB;  Service: Cardiovascular;  Laterality: N/A;   POLYPECTOMY     POLYPECTOMY  10/06/2021   Procedure: POLYPECTOMY;  Surgeon: Beverley Fiedler, MD;  Location: [facility];  Service: Gastroenterology;;   VIDEO ASSISTED THORACOSCOPY Right 01/17/2019   Procedure: VIDEO ASSISTED THORACOSCOPY, pericardial window for pericardium and pericardial fluid.;  Surgeon: Corliss Skains, MD;  Location: [facility];  Service: Thoracic;  Laterality: Right;    No Known Allergies   Objective/Physical Exam General: The patient is alert and oriented x3 in no acute distress.  Dermatology:  The ulcer to the plantar aspect of the fifth MTP of the right foot is resolved.  Complete reepithelialization has occurred.  There is no indication of persistent ulcer or underlying infection  Vascular: Palpable pedal pulses bilaterally. No edema or erythema noted. Capillary refill within normal limits.  Neurological: Light touch and protective threshold diminished bilaterally.   Musculoskeletal Exam: Range of motion within normal limits to all pedal and ankle joints bilateral. Muscle strength 5/5 in all groups bilateral.   Assessment: 1.  Ulcer plantar aspect of the fifth MTP right foot secondary to diabetes mellitus; healed 2. diabetes mellitus w/ peripheral neuropathy   Plan of Care:  -Patient was evaluated. -  Light debridement of the callus area was performed today using 312 scalpel.  The wound is completely healed and resolved -Continue offloading felt dancers pads with good supportive tennis shoes -The patient is on a kidney transplant recipient waitlist.  From a podiatry standpoint the wound is completely healed/resolved and there is no indication of infection. -Return to clinic 3 months routine footcare  Felecia Shelling, DPM [facility]  Dr. Felecia Shelling, DPM    [address] [place], [place] [address]                Office [phone]  Fax [phone]

## 2023-03-14 ENCOUNTER — Encounter: Payer: Self-pay | Admitting: Podiatry

## 2023-03-14 MED ORDER — DOXYCYCLINE HYCLATE 100 MG PO TABS: 100.0000 mg | ORAL_TABLET | Freq: Two times a day (BID) | ORAL | 0 refills | Status: DC

## 2023-03-15 ENCOUNTER — Encounter: Payer: Self-pay | Admitting: Podiatry

## 2023-03-15 ENCOUNTER — Emergency Department (HOSPITAL_COMMUNITY): Payer: Medicare Other

## 2023-03-15 ENCOUNTER — Inpatient Hospital Stay (HOSPITAL_COMMUNITY)
Admission: EM | Admit: 2023-03-15 | Discharge: 2023-03-18 | DRG: 629 | Disposition: A | Discharge status: Home / Self Care | Payer: Medicare Other | Source: Ambulatory Visit | Attending: Internal Medicine | Admitting: Internal Medicine

## 2023-03-15 ENCOUNTER — Other Ambulatory Visit: Payer: Self-pay | Admitting: Podiatry

## 2023-03-15 ENCOUNTER — Encounter (HOSPITAL_COMMUNITY): Payer: Self-pay

## 2023-03-15 ENCOUNTER — Ambulatory Visit (INDEPENDENT_AMBULATORY_CARE_PROVIDER_SITE_OTHER): Payer: Medicare Other | Admitting: Podiatry

## 2023-03-15 ENCOUNTER — Other Ambulatory Visit: Payer: Self-pay

## 2023-03-15 ENCOUNTER — Ambulatory Visit (INDEPENDENT_AMBULATORY_CARE_PROVIDER_SITE_OTHER): Payer: Medicare Other

## 2023-03-15 DIAGNOSIS — L97512 Non-pressure chronic ulcer of other part of right foot with fat layer exposed: Secondary | ICD-10-CM

## 2023-03-15 DIAGNOSIS — Z794 Long term (current) use of insulin: Secondary | ICD-10-CM

## 2023-03-15 DIAGNOSIS — D631 Anemia in chronic kidney disease: Secondary | ICD-10-CM | POA: Diagnosis present

## 2023-03-15 DIAGNOSIS — Z8042 Family history of malignant neoplasm of prostate: Secondary | ICD-10-CM

## 2023-03-15 DIAGNOSIS — Z833 Family history of diabetes mellitus: Secondary | ICD-10-CM

## 2023-03-15 DIAGNOSIS — L97513 Non-pressure chronic ulcer of other part of right foot with necrosis of muscle: Principal | ICD-10-CM | POA: Diagnosis present

## 2023-03-15 DIAGNOSIS — Z8249 Family history of ischemic heart disease and other diseases of the circulatory system: Secondary | ICD-10-CM

## 2023-03-15 DIAGNOSIS — L02611 Cutaneous abscess of right foot: Secondary | ICD-10-CM

## 2023-03-15 DIAGNOSIS — M869 Osteomyelitis, unspecified: Secondary | ICD-10-CM

## 2023-03-15 DIAGNOSIS — L03031 Cellulitis of right toe: Secondary | ICD-10-CM | POA: Diagnosis not present

## 2023-03-15 DIAGNOSIS — N2581 Secondary hyperparathyroidism of renal origin: Secondary | ICD-10-CM | POA: Diagnosis present

## 2023-03-15 DIAGNOSIS — E1169 Type 2 diabetes mellitus with other specified complication: Secondary | ICD-10-CM | POA: Diagnosis not present

## 2023-03-15 DIAGNOSIS — N186 End stage renal disease: Secondary | ICD-10-CM | POA: Diagnosis present

## 2023-03-15 DIAGNOSIS — I132 Hypertensive heart and chronic kidney disease with heart failure and with stage 5 chronic kidney disease, or end stage renal disease: Secondary | ICD-10-CM | POA: Diagnosis present

## 2023-03-15 DIAGNOSIS — E1122 Type 2 diabetes mellitus with diabetic chronic kidney disease: Secondary | ICD-10-CM

## 2023-03-15 DIAGNOSIS — Z6833 Body mass index (BMI) 33.0-33.9, adult: Secondary | ICD-10-CM

## 2023-03-15 DIAGNOSIS — Z79899 Other long term (current) drug therapy: Secondary | ICD-10-CM

## 2023-03-15 DIAGNOSIS — L089 Local infection of the skin and subcutaneous tissue, unspecified: Secondary | ICD-10-CM | POA: Diagnosis present

## 2023-03-15 DIAGNOSIS — Z7985 Long-term (current) use of injectable non-insulin antidiabetic drugs: Secondary | ICD-10-CM

## 2023-03-15 DIAGNOSIS — Z992 Dependence on renal dialysis: Secondary | ICD-10-CM

## 2023-03-15 DIAGNOSIS — E785 Hyperlipidemia, unspecified: Secondary | ICD-10-CM | POA: Diagnosis present

## 2023-03-15 DIAGNOSIS — I10 Essential (primary) hypertension: Secondary | ICD-10-CM | POA: Diagnosis present

## 2023-03-15 DIAGNOSIS — Z825 Family history of asthma and other chronic lower respiratory diseases: Secondary | ICD-10-CM

## 2023-03-15 DIAGNOSIS — G4733 Obstructive sleep apnea (adult) (pediatric): Secondary | ICD-10-CM

## 2023-03-15 DIAGNOSIS — L97416 Non-pressure chronic ulcer of right heel and midfoot with bone involvement without evidence of necrosis: Secondary | ICD-10-CM | POA: Diagnosis present

## 2023-03-15 DIAGNOSIS — E11628 Type 2 diabetes mellitus with other skin complications: Secondary | ICD-10-CM | POA: Diagnosis present

## 2023-03-15 DIAGNOSIS — E11621 Type 2 diabetes mellitus with foot ulcer: Secondary | ICD-10-CM | POA: Diagnosis present

## 2023-03-15 DIAGNOSIS — I5032 Chronic diastolic (congestive) heart failure: Secondary | ICD-10-CM | POA: Diagnosis present

## 2023-03-15 DIAGNOSIS — E669 Obesity, unspecified: Secondary | ICD-10-CM | POA: Diagnosis present

## 2023-03-15 DIAGNOSIS — Z87891 Personal history of nicotine dependence: Secondary | ICD-10-CM

## 2023-03-15 DIAGNOSIS — D649 Anemia, unspecified: Secondary | ICD-10-CM | POA: Insufficient documentation

## 2023-03-15 DIAGNOSIS — I872 Venous insufficiency (chronic) (peripheral): Secondary | ICD-10-CM | POA: Diagnosis present

## 2023-03-15 HISTORY — DX: Dependence on renal dialysis: N18.6

## 2023-03-15 HISTORY — DX: End stage renal disease: Z99.2

## 2023-03-15 LAB — BASIC METABOLIC PANEL
Anion gap: 11 (ref 5–15)
BUN: 74 mg/dL — ABNORMAL HIGH (ref 8–23)
CO2: 19 mmol/L — ABNORMAL LOW (ref 22–32)
Calcium: 8.7 mg/dL — ABNORMAL LOW (ref 8.9–10.3)
Chloride: 106 mmol/L (ref 98–111)
Creatinine, Ser: 7.38 mg/dL — ABNORMAL HIGH (ref 0.61–1.24)
GFR, Estimated: 8 mL/min — ABNORMAL LOW (ref >60)
Glucose, Bld: 97 mg/dL (ref 70–99)
Potassium: 4 mmol/L (ref 3.5–5.1)
Sodium: 136 mmol/L (ref 135–145)

## 2023-03-15 LAB — CBC
HCT: 35.4 % — ABNORMAL LOW (ref 39.0–52.0)
Hemoglobin: 11.9 g/dL — ABNORMAL LOW (ref 13.0–17.0)
MCH: 30.2 pg (ref 26.0–34.0)
MCHC: 33.6 g/dL (ref 30.0–36.0)
MCV: 89.8 fL (ref 80.0–100.0)
Platelets: 290 10*3/uL (ref 150–400)
RBC: 3.94 MIL/uL — ABNORMAL LOW (ref 4.22–5.81)
RDW: 13.9 % (ref 11.5–15.5)
WBC: 11.6 10*3/uL — ABNORMAL HIGH (ref 4.0–10.5)
nRBC: 0 % (ref 0.0–0.2)

## 2023-03-15 LAB — CBG MONITORING, ED
Glucose-Capillary: 88 mg/dL (ref 70–99)
Glucose-Capillary: 84 mg/dL (ref 70–99)

## 2023-03-15 MED ORDER — HYDROCODONE-ACETAMINOPHEN 5-325 MG PO TABS
1.0000 | ORAL_TABLET | Freq: Once | ORAL | Status: AC
  Administered 2023-03-15: 1 via ORAL
  Filled 2023-03-15: qty 1

## 2023-03-15 MED ORDER — VANCOMYCIN HCL 2000 MG/400ML IV SOLN
2000.0000 mg | Freq: Once | INTRAVENOUS | Status: AC
  Administered 2023-03-15: 2000 mg via INTRAVENOUS
  Filled 2023-03-15: qty 400

## 2023-03-15 MED ORDER — VANCOMYCIN HCL IN DEXTROSE 1-5 GM/200ML-% IV SOLN: 1000.0000 mg | Freq: Once | INTRAVENOUS | Status: DC

## 2023-03-15 MED ORDER — FENTANYL CITRATE PF 50 MCG/ML IJ SOSY
50.0000 ug | PREFILLED_SYRINGE | Freq: Once | INTRAMUSCULAR | Status: AC
  Administered 2023-03-15: 50 ug via INTRAVENOUS
  Filled 2023-03-15: qty 1

## 2023-03-15 MED ORDER — SODIUM CHLORIDE 0.9 % IV SOLN
2.0000 g | Freq: Once | INTRAVENOUS | Status: AC
  Administered 2023-03-15: 2 g via INTRAVENOUS
  Filled 2023-03-15: qty 12.5

## 2023-03-15 NOTE — ED Triage Notes (Addendum)
 Pt sent from podiatrist for workup of R foot wound. Pt states that he had a callous removed about a month ago and now thinks there may be infection. H/x diabetes

## 2023-03-15 NOTE — Addendum Note (Signed)
 Addended by: Maryruth Hancock D on: 03/15/2023 03:20 PM   Modules accepted: Orders

## 2023-03-15 NOTE — ED Provider Notes (Signed)
 [place] EMERGENCY DEPARTMENT AT [facility] Provider Note   CSN: [id number] Arrival date & time: 03/15/23  1523     History  Chief Complaint  Patient presents with   Foot Injury    Samuel Reed is a 63 y.o. male with past medical history for hypertension, hyperlipidemia, diabetes who presents with concern for wound on the plantar aspect of the right foot.  Patient reports that he had a callus removed around a month ago and is concerned about an infection.  He was started on doxycycline 100 mg twice daily for 10 days yesterday.  He reports that his most recent A1c was 6.5, he reports he has not had any trouble with blood supply to extremities in the past.  Sent by podiatrist for hospital admission.  History of CKD.   Foot Injury      Home Medications Prior to Admission medications   Medication Sig Start Date End Date Taking? Authorizing Provider  amLODipine (NORVASC) 10 MG tablet TAKE 1 TABLET BY MOUTH EVERY DAY 01/28/23   Karie Schwalbe, MD  cholecalciferol (VITAMIN D) 25 MCG (1000 UNIT) tablet Take 2,000 Units by mouth daily with breakfast.    [provider]  Continuous Glucose Sensor (FREESTYLE LIBRE 3 PLUS SENSOR) MISC Inject 1 Device into the skin continuous. Change every 15 days 10/13/22   Carlus Pavlov, MD  Continuous Glucose Sensor (FREESTYLE LIBRE 3 SENSOR) MISC USE 1 SENSOR EVERY 14 DAYS 10/12/22   Carlus Pavlov, MD  doxycycline (VIBRA-TABS) 100 MG tablet Take 1 tablet (100 mg total) by mouth 2 (two) times daily. 03/14/23   Felecia Shelling, DPM  fluticasone (FLONASE) 50 MCG/ACT nasal spray PLACE 1 SPRAY INTO BOTH NOSTRILS 2 (TWO) TIMES DAILY. Patient taking differently: Place 2 sprays into both nostrils 2 (two) times daily. 09/26/18   Karie Schwalbe, MD  gentamicin cream (GARAMYCIN) 0.1 % Apply 1 application topically 2 (two) times daily. Patient taking differently: Apply 1 application  topically daily. 02/26/21   Felecia Shelling, DPM  insulin  glargine (LANTUS SOLOSTAR) 100 UNIT/ML Solostar Pen Inject 65 Units into the skin daily. 01/22/23   Carlus Pavlov, MD  insulin lispro (HUMALOG KWIKPEN) 100 UNIT/ML KwikPen INJECT 10-20 UNITS TOTAL INTO THE SKIN 3 (THREE) TIMES DAILY BEFORE MEALS. 07/14/22   Carlus Pavlov, MD  Insulin Pen Needle (BD PEN NEEDLE NANO 2ND GEN) 32G X 4 MM MISC USE TO INJECT INSULIN 4 TIMES A DAY 07/10/22   Carlus Pavlov, MD  losartan (COZAAR) 100 MG tablet Take 100 mg by mouth at bedtime. 06/16/21   [provider]  metoprolol tartrate (LOPRESSOR) 100 MG tablet TAKE 1 TABLET BY MOUTH TWICE A DAY 01/28/23   Tillman Abide I, MD  potassium chloride SA (KLOR-CON M) 20 MEQ tablet Take 20 mEq by mouth at bedtime.    [provider]  PRESCRIPTION MEDICATION CPAP: At bedtime    [provider]  rosuvastatin (CRESTOR) 20 MG tablet TAKE 1 TABLET BY MOUTH EVERY DAY 01/28/23   Karie Schwalbe, MD  sevelamer carbonate (RENVELA) 800 MG tablet Take 800 mg by mouth 3 (three) times daily with meals. 09/23/21   [provider]  tirzepatide Greggory Keen) 5 MG/0.5ML Pen Inject 5 mg into the skin once a week. 02/16/23   Carlus Pavlov, MD  torsemide (DEMADEX) 100 MG tablet Take 100 mg by mouth daily. 04/22/21   [provider]      Allergies    Patient has no known allergies.  Review of Systems   Review of Systems  All other systems reviewed and are negative.   Physical Exam Updated Vital Signs BP (!) 124/57 (BP Location: Left Arm)   Pulse 67   Temp 98.1 F (36.7 C) (Oral)   Resp 16   Ht 6' (1.829 m)   Wt 111.6 kg   SpO2 100%   BMI 33.36 kg/m  Physical Exam Vitals and nursing note reviewed.  Constitutional:      General: He is not in acute distress.    Appearance: Normal appearance.  HENT:     Head: Normocephalic and atraumatic.  Eyes:     General:        Right eye: No discharge.        Left eye: No discharge.  Cardiovascular:     Rate and Rhythm: Normal rate  and regular rhythm.     Pulses: Normal pulses.     Heart sounds: No murmur heard.    No friction rub. No gallop.     Comments: DP, PT pulses are 2+ in the affected right lower extremity Pulmonary:     Effort: Pulmonary effort is normal.     Breath sounds: Normal breath sounds.  Abdominal:     General: Bowel sounds are normal.     Palpations: Abdomen is soft.  Skin:    General: Skin is warm and dry.     Capillary Refill: Capillary refill takes less than 2 seconds. Capillary refill distal to injury is intact.    Comments: Right plantar wound on the lateral aspect of the foot with foul-smelling necrotic tissue, intact perfusion of wound bed, no active pus draining at my exam.  Neurological:     Mental Status: He is alert and oriented to person, place, and time.  Psychiatric:        Mood and Affect: Mood normal.        Behavior: Behavior normal.     ED Results / Procedures / Treatments   Labs (all labs ordered are listed, but only abnormal results are displayed) Labs Reviewed  BASIC METABOLIC PANEL - Abnormal; Notable for the following components:      Result Value   CO2 19 (*)    BUN 74 (*)    Creatinine, Ser 7.38 (*)    Calcium 8.7 (*)    GFR, Estimated 8 (*)    All other components within normal limits  CBC - Abnormal; Notable for the following components:   WBC 11.6 (*)    RBC 3.94 (*)    Hemoglobin 11.9 (*)    HCT 35.4 (*)    All other components within normal limits  CBG MONITORING, ED  CBG MONITORING, ED    EKG None  Radiology DG Foot Complete Right Result Date: 03/15/2023 CLINICAL DATA:  Foot wound EXAM: RIGHT FOOT COMPLETE - 3+ VIEW COMPARISON:  03/15/2023, 01/19/2023 FINDINGS: No fracture or malalignment. Similar osseous bridging across the third through fifth proximal metatarsals. Similar tarsal degenerative changes. Moderate plantar calcaneal spur. Wound plantar aspect of the distal foot, no acute osseous destructive change. No soft tissue emphysema  IMPRESSION: Wound plantar aspect of the distal foot. No acute osseous destructive change. Electronically Signed   By: Jasmine Pang M.D.   On: 03/15/2023 22:03    Procedures Procedures    Medications Ordered in ED Medications  ceFEPIme (MAXIPIME) 2 g in sodium chloride 0.9 % 100 mL IVPB (2 g Intravenous New Bag/Given 03/15/23 2306)  vancomycin (VANCOREADY) IVPB 2000 mg/400 mL (2,000  mg Intravenous New Bag/Given 03/15/23 2305)  HYDROcodone-acetaminophen (NORCO/VICODIN) 5-325 MG per tablet 1 tablet (1 tablet Oral Given 03/15/23 1618)  fentaNYL (SUBLIMAZE) injection 50 mcg (50 mcg Intravenous Given 03/15/23 2317)    ED Course/ Medical Decision Making/ A&P                                 Medical Decision Making Amount and/or Complexity of Data Reviewed Radiology: ordered.   This patient is a 63 y.o. male  who presents to the ED for concern of foot wound, possible progression to osteomyelitis.   Differential diagnoses prior to evaluation: The emergent differential diagnosis includes, but is not limited to, cellulitis, osteomyelitis, bacteremia, sepsis, peripheral arterial disease, venous insufficiency, versus other. This is not an exhaustive differential.   Past Medical History / Co-morbidities / Social History: ESRD on peritoneal dialysis, obesity, heart failure, hypertension, hyperlipidemia, diabetes  Additional history: Chart reviewed. Pertinent results include: Reviewed outpatient podiatry note just prior to arrival, notably podiatrist recommended direct hospital admission, MRI and podiatry consult due to the degree of necrotic foul-smelling wound debrided at the clinic just prior to arrival.  Physical Exam: Physical exam performed. The pertinent findings include: Capillary refill distal to injury is intact.    Comments: Right plantar wound on the lateral aspect of the foot with foul-smelling necrotic tissue, intact perfusion of wound bed, no active pus draining at my exam.   DP, PT  pulses are 2+ in the affected right lower extremity  Lab Tests/Imaging studies: I personally interpreted labs/imaging and the pertinent results include: CBC with mild leukocytosis, blood cells 11.6, mild anemia, hemoglobin 11.9, likely anemia of chronic disease in context of his ESRD on peritoneal dialysis.  Elevated BUN, creatinine consistent with his ESRD, mild bicarb deficit, CO2 19, normal CBG.  I independently interpreted plain film radiograph of the right foot x-ray who presents concern for foul-smelling necrotic wound on plantar aspect of foot.  Muscle bed noted, no exposed bone tissue.  Neurovascularly intact lower extremity. I agree with the radiologist interpretation.   Medications: I ordered medication including Norco for pain, vancomycin, cefepime for antibiosis.  I have reviewed the patients home medicines and have made adjustments as needed.   Consult: I spoke with the hospitalist, Dr Loney Loh, who agrees to admission, podiatry will consult while patient is in the hospital.  Disposition: After consideration of the diagnostic results and the patients response to treatment, I feel that patient would benefit from hospital admission for necrotic foot wound.   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

## 2023-03-15 NOTE — ED Provider Triage Note (Signed)
 Emergency Medicine Provider Triage Evaluation Note  BRAXEN DOBEK , a 63 y.o. male  was evaluated in triage.  Pt complains of right foot wound.  States he had a callus removed on the sole of his right foot about a month ago.  Was seen by podiatry today and there was concern that he may have infection there now.  States it was oozing pustulant drainage.  Patient also endorses pain in that area.  Denies fever  and chills.   Review of Systems  Positive: See above Negative: See above  Physical Exam  BP 126/60 (BP Location: Right Arm)   Pulse 75   Temp 98.2 F (36.8 C)   Resp 16   Ht 6' (1.829 m)   Wt 111.6 kg   SpO2 100%   BMI 33.36 kg/m  Gen:   Awake, no distress  Resp:  Normal effort  MSK:   Moves extremities without difficulty  Other:    Medical Decision Making  Medically screening exam initiated at 4:14 PM.  Appropriate orders placed.  DEHAVEN SINE was informed that the remainder of the evaluation will be completed by another provider, this initial triage assessment does not replace that evaluation, and the importance of remaining in the ED until their evaluation is complete.  Work up started   Gareth Eagle, [place] 03/15/23 1617

## 2023-03-15 NOTE — Progress Notes (Addendum)
 Chief Complaint  Patient presents with   Callouses    Patient states he had his Callouse removed off his right foot over a month ago and he believes that it is infected now.    Subjective:  63 y.o. male with PMHx of diabetes mellitus presenting for acute onset of an ulcer to the plantar aspect of the right foot.  Patient is unsure how long the wound has been present for.  He was started on oral doxycycline 100 mg twice daily over the weekend.  Presenting for follow-up treatment evaluation   Past Medical History:  Diagnosis Date   Allergy    Anemia    CHF (congestive heart failure) (HCC)    Chronic venous insufficiency    CKD (chronic kidney disease), stage III (HCC)    Complication of anesthesia    patient was still awake when he was being intubated   Diabetes mellitus type II 2003   HLD (hyperlipidemia)    HTN (hypertension) 1980's   Neuromuscular disorder (HCC)    neuropathy feet    OSA (obstructive sleep apnea)    Pericardial effusion    S/P pericardial window 01/16/2019   Sleep apnea    wear cpap     Past Surgical History:  Procedure Laterality Date   "Bone removal" from back  1987   AV FISTULA PLACEMENT Right 04/02/2021   Procedure: RIGHT RADIOCEPHALIC  ARTERIOVENOUS (AV) FISTULA CREATION;  Surgeon: Nada Libman, MD;  Location: [facility];  Service: Vascular;  Laterality: Right;   CAPD INSERTION N/A 04/02/2021   Procedure: LAPAROSCOPIC INSERTION CONTINUOUS AMBULATORY PERITONEAL DIALYSIS  (CAPD) CATHETER;  Surgeon: Nada Libman, MD;  Location: [facility] OR;  Service: Vascular;  Laterality: N/A;   CARDIAC CATHETERIZATION     CARDIAC CATHETERIZATION N/A 02/12/2015   Procedure: Left Heart Cath and Coronary Angiography;  Surgeon: Yates Decamp, MD;  Location: [facility] INVASIVE CV LAB;  Service: Cardiovascular;  Laterality: N/A;   COLONOSCOPY     COLONOSCOPY WITH PROPOFOL N/A 10/06/2021   Procedure: COLONOSCOPY WITH PROPOFOL;  Surgeon: Beverley Fiedler, MD;  Location: [facility];  Service:  Gastroenterology;  Laterality: N/A;  CKD, on dialysis   KNEE CARTILAGE SURGERY     left knee   LUMBAR DISC SURGERY  1986   PERICARDIOCENTESIS N/A 01/16/2019   Procedure: PERICARDIOCENTESIS;  Surgeon: Lennette Bihari, MD;  Location: [facility] INVASIVE CV LAB;  Service: Cardiovascular;  Laterality: N/A;   POLYPECTOMY     POLYPECTOMY  10/06/2021   Procedure: POLYPECTOMY;  Surgeon: Beverley Fiedler, MD;  Location: [facility];  Service: Gastroenterology;;   VIDEO ASSISTED THORACOSCOPY Right 01/17/2019   Procedure: VIDEO ASSISTED THORACOSCOPY, pericardial window for pericardium and pericardial fluid.;  Surgeon: Corliss Skains, MD;  Location: [facility] OR;  Service: Thoracic;  Laterality: Right;    No Known Allergies   RT foot 03/15/2023  RT foot 03/15/2023   Objective/Physical Exam General: The patient is alert and oriented x3 in no acute distress.  Dermatology:  Wound #1 noted to the plantar aspect of the right foot fifth MTP approximately 2.0 x 2.0 x 1.0 cm.  Cm (LxWxD).  Strong malodor with heavy serous drainage.  Extensive necrotic tissue within the wound base.  The wound extends down to the level of joint capsule.  There is some concern for possible underlying osteomyelitis.  Vascular: There does appear to be some adequate perfusion with debridement.  Pulses palpable.  Capillary refill WNL  Neurological: Light touch and protective threshold diminished bilaterally.  Musculoskeletal Exam: Range of motion within normal limits to all pedal and ankle joints bilateral. Muscle strength 5/5 in all groups bilateral.  No prior amputations  Radiographic exam RT foot 03/15/2023: Moderate degenerative changes noted throughout the midtarsal joints of the foot.  No acute fractures identified.  No osseous erosions around the fifth metatarsal head.  No gas within the tissues  Assessment: 1.  Ulcer plantar aspect of the fifth MTP right foot secondary to diabetes mellitus 2. diabetes mellitus w/ peripheral  neuropathy 3.  Acute cellulitis/abscess with strong malodor and concern for possible underlying osteomyelitis   Plan of Care:  -Patient evaluated.  X-rays reviewed -Cultures taken and sent to pathology for cultures and sensitivities -Medically necessary excisional debridement including muscle and deep fascial tissue was performed using a tissue nipper and a chisel blade. Excisional debridement of all the necrotic nonviable tissue down to healthy bleeding viable tissue was performed with post-debridement measurements same as pre-. -Due to the strong malodor to the foot drainage recommend the patient go to the emergency department for admission and full workup.  Patient is in agreement and amenable to this plan.  Recommend MRI upon admission RT foot -Recommend podiatry consult when the patient is admitted -In the meantime, minimal WBAT surgical shoe.  Surgical shoe dispensed -Will plan to follow inpatient  Felecia Shelling, DPM [facility]  Dr. Felecia Shelling, DPM    [address] [place], [place] [address]                Office [phone]  Fax [phone]

## 2023-03-16 ENCOUNTER — Encounter (HOSPITAL_COMMUNITY): Payer: Self-pay | Admitting: Internal Medicine

## 2023-03-16 ENCOUNTER — Inpatient Hospital Stay (HOSPITAL_COMMUNITY): Payer: Medicare Other

## 2023-03-16 DIAGNOSIS — L089 Local infection of the skin and subcutaneous tissue, unspecified: Secondary | ICD-10-CM | POA: Diagnosis present

## 2023-03-16 DIAGNOSIS — M86171 Other acute osteomyelitis, right ankle and foot: Secondary | ICD-10-CM | POA: Diagnosis not present

## 2023-03-16 DIAGNOSIS — M869 Osteomyelitis, unspecified: Secondary | ICD-10-CM | POA: Diagnosis present

## 2023-03-16 DIAGNOSIS — E785 Hyperlipidemia, unspecified: Secondary | ICD-10-CM | POA: Diagnosis present

## 2023-03-16 DIAGNOSIS — L97416 Non-pressure chronic ulcer of right heel and midfoot with bone involvement without evidence of necrosis: Secondary | ICD-10-CM | POA: Diagnosis present

## 2023-03-16 DIAGNOSIS — I132 Hypertensive heart and chronic kidney disease with heart failure and with stage 5 chronic kidney disease, or end stage renal disease: Secondary | ICD-10-CM | POA: Diagnosis present

## 2023-03-16 DIAGNOSIS — Z8249 Family history of ischemic heart disease and other diseases of the circulatory system: Secondary | ICD-10-CM | POA: Diagnosis not present

## 2023-03-16 DIAGNOSIS — E11628 Type 2 diabetes mellitus with other skin complications: Secondary | ICD-10-CM

## 2023-03-16 DIAGNOSIS — Z87891 Personal history of nicotine dependence: Secondary | ICD-10-CM | POA: Diagnosis not present

## 2023-03-16 DIAGNOSIS — G4733 Obstructive sleep apnea (adult) (pediatric): Secondary | ICD-10-CM | POA: Diagnosis present

## 2023-03-16 DIAGNOSIS — N2581 Secondary hyperparathyroidism of renal origin: Secondary | ICD-10-CM | POA: Diagnosis present

## 2023-03-16 DIAGNOSIS — L97513 Non-pressure chronic ulcer of other part of right foot with necrosis of muscle: Secondary | ICD-10-CM | POA: Diagnosis present

## 2023-03-16 DIAGNOSIS — Z992 Dependence on renal dialysis: Secondary | ICD-10-CM | POA: Diagnosis not present

## 2023-03-16 DIAGNOSIS — Z7985 Long-term (current) use of injectable non-insulin antidiabetic drugs: Secondary | ICD-10-CM | POA: Diagnosis not present

## 2023-03-16 DIAGNOSIS — Z794 Long term (current) use of insulin: Secondary | ICD-10-CM | POA: Diagnosis not present

## 2023-03-16 DIAGNOSIS — E1169 Type 2 diabetes mellitus with other specified complication: Secondary | ICD-10-CM | POA: Diagnosis present

## 2023-03-16 DIAGNOSIS — N186 End stage renal disease: Secondary | ICD-10-CM | POA: Diagnosis present

## 2023-03-16 DIAGNOSIS — E669 Obesity, unspecified: Secondary | ICD-10-CM | POA: Diagnosis present

## 2023-03-16 DIAGNOSIS — E11621 Type 2 diabetes mellitus with foot ulcer: Secondary | ICD-10-CM | POA: Diagnosis present

## 2023-03-16 DIAGNOSIS — I5032 Chronic diastolic (congestive) heart failure: Secondary | ICD-10-CM | POA: Diagnosis present

## 2023-03-16 DIAGNOSIS — Z833 Family history of diabetes mellitus: Secondary | ICD-10-CM | POA: Diagnosis not present

## 2023-03-16 DIAGNOSIS — Z79899 Other long term (current) drug therapy: Secondary | ICD-10-CM | POA: Diagnosis not present

## 2023-03-16 DIAGNOSIS — I872 Venous insufficiency (chronic) (peripheral): Secondary | ICD-10-CM | POA: Diagnosis present

## 2023-03-16 DIAGNOSIS — E1122 Type 2 diabetes mellitus with diabetic chronic kidney disease: Secondary | ICD-10-CM | POA: Diagnosis present

## 2023-03-16 DIAGNOSIS — D631 Anemia in chronic kidney disease: Secondary | ICD-10-CM | POA: Diagnosis present

## 2023-03-16 DIAGNOSIS — Z6833 Body mass index (BMI) 33.0-33.9, adult: Secondary | ICD-10-CM | POA: Diagnosis not present

## 2023-03-16 DIAGNOSIS — I10 Essential (primary) hypertension: Secondary | ICD-10-CM | POA: Diagnosis not present

## 2023-03-16 LAB — COMPREHENSIVE METABOLIC PANEL
ALT: 19 U/L (ref 0–44)
AST: 11 U/L — ABNORMAL LOW (ref 15–41)
Albumin: 3 g/dL — ABNORMAL LOW (ref 3.5–5.0)
Alkaline Phosphatase: 62 U/L (ref 38–126)
Anion gap: 12 (ref 5–15)
BUN: 78 mg/dL — ABNORMAL HIGH (ref 8–23)
CO2: 17 mmol/L — ABNORMAL LOW (ref 22–32)
Calcium: 8.8 mg/dL — ABNORMAL LOW (ref 8.9–10.3)
Chloride: 109 mmol/L (ref 98–111)
Creatinine, Ser: 7.76 mg/dL — ABNORMAL HIGH (ref 0.61–1.24)
GFR, Estimated: 7 mL/min — ABNORMAL LOW (ref >60)
Glucose, Bld: 111 mg/dL — ABNORMAL HIGH (ref 70–99)
Potassium: 4.6 mmol/L (ref 3.5–5.1)
Sodium: 138 mmol/L (ref 135–145)
Total Bilirubin: 0.8 mg/dL (ref 0.0–1.2)
Total Protein: 6.6 g/dL (ref 6.5–8.1)

## 2023-03-16 LAB — GLUCOSE, CAPILLARY
Glucose-Capillary: 129 mg/dL — ABNORMAL HIGH (ref 70–99)
Glucose-Capillary: 112 mg/dL — ABNORMAL HIGH (ref 70–99)
Glucose-Capillary: 93 mg/dL (ref 70–99)
Glucose-Capillary: 165 mg/dL — ABNORMAL HIGH (ref 70–99)

## 2023-03-16 LAB — CBC
HCT: 34.8 % — ABNORMAL LOW (ref 39.0–52.0)
Hemoglobin: 11.4 g/dL — ABNORMAL LOW (ref 13.0–17.0)
MCH: 29.8 pg (ref 26.0–34.0)
MCHC: 32.8 g/dL (ref 30.0–36.0)
MCV: 91.1 fL (ref 80.0–100.0)
Platelets: 266 10*3/uL (ref 150–400)
RBC: 3.82 MIL/uL — ABNORMAL LOW (ref 4.22–5.81)
RDW: 14 % (ref 11.5–15.5)
WBC: 11 10*3/uL — ABNORMAL HIGH (ref 4.0–10.5)
nRBC: 0 % (ref 0.0–0.2)

## 2023-03-16 LAB — HIV ANTIBODY (ROUTINE TESTING W REFLEX): HIV Screen 4th Generation wRfx: NONREACTIVE

## 2023-03-16 LAB — PHOSPHORUS: Phosphorus: 7.1 mg/dL — ABNORMAL HIGH (ref 2.5–4.6)

## 2023-03-16 LAB — HEMOGLOBIN A1C
Hgb A1c MFr Bld: 6.1 % — ABNORMAL HIGH (ref 4.8–5.6)
Mean Plasma Glucose: 128.37 mg/dL

## 2023-03-16 MED ORDER — GENTAMICIN SULFATE 0.1 % EX CREA
1.0000 | TOPICAL_CREAM | Freq: Every day | CUTANEOUS | Status: DC
  Administered 2023-03-17: 1 via TOPICAL
  Filled 2023-03-16: qty 15

## 2023-03-16 MED ORDER — SODIUM BICARBONATE 8.4 % IV SOLN
25.0000 meq | Freq: Once | INTRAVENOUS | Status: AC
  Administered 2023-03-16: 25 meq via INTRAVENOUS
  Filled 2023-03-16: qty 50

## 2023-03-16 MED ORDER — HYDROCODONE-ACETAMINOPHEN 5-325 MG PO TABS
1.0000 | ORAL_TABLET | ORAL | Status: DC | PRN
  Administered 2023-03-16 – 2023-03-18 (×7): 1 via ORAL
  Filled 2023-03-16 (×7): qty 1

## 2023-03-16 MED ORDER — INSULIN ASPART 100 UNIT/ML IJ SOLN
0.0000 [IU] | INTRAMUSCULAR | Status: DC
  Administered 2023-03-16 – 2023-03-18 (×6): 1 [IU] via SUBCUTANEOUS
  Administered 2023-03-18: 2 [IU] via SUBCUTANEOUS

## 2023-03-16 MED ORDER — GADOBUTROL 1 MMOL/ML IV SOLN
10.0000 mL | Freq: Once | INTRAVENOUS | Status: AC | PRN
  Administered 2023-03-16: 10 mL via INTRAVENOUS

## 2023-03-16 MED ORDER — VANCOMYCIN VARIABLE DOSE PER UNSTABLE RENAL FUNCTION (PHARMACIST DOSING): Status: DC

## 2023-03-16 MED ORDER — DELFLEX-LC/2.5% DEXTROSE 394 MOSM/L IP SOLN
INTRAPERITONEAL | Status: DC
  Administered 2023-03-16: 6000 mL via INTRAPERITONEAL

## 2023-03-16 MED ORDER — SEVELAMER CARBONATE 800 MG PO TABS
800.0000 mg | ORAL_TABLET | Freq: Three times a day (TID) | ORAL | Status: DC
  Administered 2023-03-16 – 2023-03-18 (×4): 800 mg via ORAL
  Filled 2023-03-16 (×4): qty 1

## 2023-03-16 MED ORDER — SODIUM CHLORIDE 0.9 % IV SOLN
1.0000 g | INTRAVENOUS | Status: DC
  Administered 2023-03-16 – 2023-03-17 (×2): 1 g via INTRAVENOUS
  Filled 2023-03-16 (×4): qty 10

## 2023-03-16 MED ORDER — ACETAMINOPHEN 650 MG RE SUPP: 650.0000 mg | Freq: Four times a day (QID) | RECTAL | Status: DC | PRN

## 2023-03-16 MED ORDER — ACETAMINOPHEN 325 MG PO TABS
650.0000 mg | ORAL_TABLET | Freq: Four times a day (QID) | ORAL | Status: DC | PRN
  Administered 2023-03-16: 650 mg via ORAL
  Filled 2023-03-16 (×2): qty 2

## 2023-03-16 MED ORDER — DEXTROSE 50 % IV SOLN: 12.5000 g | INTRAVENOUS | Status: DC | PRN

## 2023-03-16 MED ORDER — METRONIDAZOLE 500 MG/100ML IV SOLN
500.0000 mg | Freq: Two times a day (BID) | INTRAVENOUS | Status: DC
  Administered 2023-03-16 – 2023-03-18 (×5): 500 mg via INTRAVENOUS
  Filled 2023-03-16 (×5): qty 100

## 2023-03-16 MED ORDER — TORSEMIDE 20 MG PO TABS
100.0000 mg | ORAL_TABLET | Freq: Every day | ORAL | Status: DC
  Administered 2023-03-16 – 2023-03-18 (×3): 100 mg via ORAL
  Filled 2023-03-16 (×3): qty 5

## 2023-03-16 NOTE — Consult Note (Signed)
 Renal Service Consult Note [facility]  Samuel Reed 03/16/2023 Samuel Krabbe, MD Requesting Physician: Dr Clide Dales  Reason for Consult: ESRD pt on PD here w/ diabetic foot infection HPI: The patient is a 63 y.o. year-old w/ PMH as below who presented to ED yesterday for w/u of a R foot wound. Had callous removed 1 mo ago and now there is pus draining. In ED sbc 11K, Hb 11, K 4.0, xrays neg for osteomyelitis. Pt rec'd IV vanc, cefepime, fentanyl and po norco. TRH admitted. We are asked to see for ESRD.    Pt seen in room. In good spirits. Foot pain already improving w/ IV abx and pain medicine. Is f/b Dr Marisue Humble for his CCPD. On PD x 2 yrs, never did HD. No recent issues w/ dialysis.    PMH DM2 ESRD on PD HTN OSA   ROS - denies CP, no joint pain, no HA, no blurry vision, no rash, no diarrhea, no nausea/ vomiting, no dysuria, no difficulty voiding   Past Medical History  Past Medical History:  Diagnosis Date   Allergy    Anemia    CHF (congestive heart failure) (HCC)    Chronic venous insufficiency    CKD (chronic kidney disease), stage III (HCC)    Complication of anesthesia    patient was still awake when he was being intubated   Diabetes mellitus type II 2003   HLD (hyperlipidemia)    HTN (hypertension) 1980's   Neuromuscular disorder (HCC)    neuropathy feet    OSA (obstructive sleep apnea)    Pericardial effusion    S/P pericardial window 01/16/2019   Sleep apnea    wear cpap    Past Surgical History  Past Surgical History:  Procedure Laterality Date   "Bone removal" from back  1987   AV FISTULA PLACEMENT Right 04/02/2021   Procedure: RIGHT RADIOCEPHALIC  ARTERIOVENOUS (AV) FISTULA CREATION;  Surgeon: Nada Libman, MD;  Location: [facility];  Service: Vascular;  Laterality: Right;   callous removal Right    CAPD INSERTION N/A 04/02/2021   Procedure: LAPAROSCOPIC INSERTION CONTINUOUS AMBULATORY PERITONEAL DIALYSIS  (CAPD) CATHETER;  Surgeon:  Nada Libman, MD;  Location: [facility];  Service: Vascular;  Laterality: N/A;   CARDIAC CATHETERIZATION     CARDIAC CATHETERIZATION N/A 02/12/2015   Procedure: Left Heart Cath and Coronary Angiography;  Surgeon: Yates Decamp, MD;  Location: [facility] INVASIVE CV LAB;  Service: Cardiovascular;  Laterality: N/A;   COLONOSCOPY     COLONOSCOPY WITH PROPOFOL N/A 10/06/2021   Procedure: COLONOSCOPY WITH PROPOFOL;  Surgeon: Beverley Fiedler, MD;  Location: [facility];  Service: Gastroenterology;  Laterality: N/A;  CKD, on dialysis   KNEE CARTILAGE SURGERY     left knee   LUMBAR DISC SURGERY  1986   PERICARDIOCENTESIS N/A 01/16/2019   Procedure: PERICARDIOCENTESIS;  Surgeon: Lennette Bihari, MD;  Location: [facility] INVASIVE CV LAB;  Service: Cardiovascular;  Laterality: N/A;   POLYPECTOMY     POLYPECTOMY  10/06/2021   Procedure: POLYPECTOMY;  Surgeon: Beverley Fiedler, MD;  Location: [facility];  Service: Gastroenterology;;   VIDEO ASSISTED THORACOSCOPY Right 01/17/2019   Procedure: VIDEO ASSISTED THORACOSCOPY, pericardial window for pericardium and pericardial fluid.;  Surgeon: Corliss Skains, MD;  Location: [facility];  Service: Thoracic;  Laterality: Right;   Family History  Family History  Problem Relation Age of Onset   COPD Father    Cancer Father  prostate   Prostate cancer Father    Diabetes Mother    Coronary artery disease Paternal Grandfather    Colon cancer Neg Hx    Colon polyps Neg Hx    Esophageal cancer Neg Hx    Rectal cancer Neg Hx    Stomach cancer Neg Hx    Social History  reports that he quit smoking about 34 years ago. His smoking use included pipe. He has never been exposed to tobacco smoke. He has never used smokeless tobacco. He reports that he does not drink alcohol and does not use drugs. Allergies No Known Allergies Home medications Prior to Admission medications   Medication Sig Start Date End Date Taking? Authorizing Provider  amLODipine (NORVASC) 10 MG tablet TAKE 1  TABLET BY MOUTH EVERY DAY 01/28/23  Yes Karie Schwalbe, MD  cholecalciferol (VITAMIN D) 25 MCG (1000 UNIT) tablet Take 2,000 Units by mouth daily with breakfast.   Yes [provider]  doxycycline (VIBRA-TABS) 100 MG tablet Take 1 tablet (100 mg total) by mouth 2 (two) times daily. 03/14/23  Yes Felecia Shelling, DPM  fluticasone (FLONASE) 50 MCG/ACT nasal spray PLACE 1 SPRAY INTO BOTH NOSTRILS 2 (TWO) TIMES DAILY. Patient taking differently: Place 2 sprays into both nostrils 2 (two) times daily. 09/26/18  Yes Karie Schwalbe, MD  gentamicin cream (GARAMYCIN) 0.1 % Apply 1 application topically 2 (two) times daily. Patient taking differently: Apply 1 application  topically daily. 02/26/21  Yes Felecia Shelling, DPM  insulin glargine (LANTUS SOLOSTAR) 100 UNIT/ML Solostar Pen Inject 65 Units into the skin daily. 01/22/23  Yes Carlus Pavlov, MD  insulin lispro (HUMALOG KWIKPEN) 100 UNIT/ML KwikPen INJECT 10-20 UNITS TOTAL INTO THE SKIN 3 (THREE) TIMES DAILY BEFORE MEALS. 07/14/22  Yes Carlus Pavlov, MD  losartan (COZAAR) 100 MG tablet Take 100 mg by mouth at bedtime. 06/16/21  Yes [provider]  metoprolol tartrate (LOPRESSOR) 100 MG tablet TAKE 1 TABLET BY MOUTH TWICE A DAY 01/28/23  Yes Karie Schwalbe, MD  PRESCRIPTION MEDICATION CPAP: At bedtime   Yes [provider]  rosuvastatin (CRESTOR) 20 MG tablet TAKE 1 TABLET BY MOUTH EVERY DAY 01/28/23  Yes Karie Schwalbe, MD  sevelamer carbonate (RENVELA) 800 MG tablet Take 800 mg by mouth 3 (three) times daily with meals. 09/23/21  Yes [provider]  spironolactone (ALDACTONE) 25 MG tablet Take 25 mg by mouth every morning. 02/26/23  Yes [provider]  tirzepatide Greggory Keen) 5 MG/0.5ML Pen Inject 5 mg into the skin once a week. 02/16/23  Yes Carlus Pavlov, MD  torsemide (DEMADEX) 100 MG tablet Take 100 mg by mouth daily. 04/22/21  Yes [provider]  Continuous Glucose Sensor (FREESTYLE  LIBRE 3 PLUS SENSOR) MISC Inject 1 Device into the skin continuous. Change every 15 days 10/13/22   Carlus Pavlov, MD  Continuous Glucose Sensor (FREESTYLE LIBRE 3 SENSOR) MISC USE 1 SENSOR EVERY 14 DAYS 10/12/22   Carlus Pavlov, MD  Insulin Pen Needle (BD PEN NEEDLE NANO 2ND GEN) 32G X 4 MM MISC USE TO INJECT INSULIN 4 TIMES A DAY 07/10/22   Carlus Pavlov, MD     Vitals:   03/16/23 0631 03/16/23 0738 03/16/23 0751 03/16/23 0932  BP:  133/65 130/69 134/67  Pulse:  70 67 69  Resp:  18 18   Temp:  98.6 F (37 C)    TempSrc:  Oral    SpO2:   100% 99%  Weight: 110.7 kg  Height: 6' (1.829 m)      Exam Gen alert, no distress No rash, cyanosis or gangrene Sclera anicteric, throat clear  No jvd or bruits Chest clear bilat to bases, no rales/ wheezing RRR no MRG Abd soft ntnd no mass or ascites +bs GU nl male MS no joint effusions or deformity Ext no LE or UE edema, no other edema Neuro is alert, Ox 3 , nf    PD cath in mid-abdomen, clean exit site      Renal-related home meds: - norvasc 10 - losartan 100 hs - metoprolol 100 bid  - renvela 800 ac tid - aldactone 25 qam - torsemide 100 qd    OP CCPD: 7 nights/ wk, Dr Marisue Humble  4 exchanges, 3000 cc dwells, 1.5hr dwell time - usually hangs mix of 1.5% and 2.5% fluids    Assessment/ Plan: Infected R foot ulcer - in a diabetic. Getting IV abx, wound cx's sent. Per pmd ESRD - on CCPD. Plan PD nightly while here.  HTN - bp's stable 135/ 70. Follow.  Volume - euvolemic, no vol  excess. Mild pretib edema due to missed PD last night. Use all 2.5% tonight. Cont torsemide daily.  Anemia of esrd - Hb good 11-12 here, no esa needs.  Secondary hyperparathyroidism - CCa in range, add on phos. Cont binders w/ meals.       Vinson Moselle  MD [facility] 03/16/2023, 12:20 PM  Recent Labs  Lab 03/15/23 1618 03/16/23 0410  HGB 11.9* 11.4*  ALBUMIN  --  3.0*  CALCIUM 8.7* 8.8*  CREATININE 7.38* 7.76*  K 4.0 4.6   Inpatient  medications:  insulin aspart  0-6 Units Subcutaneous Q4H   vancomycin variable dose per unstable renal function (pharmacist dosing)   Does not apply See admin instructions    ceFEPime (MAXIPIME) IV     metronidazole 500 mg (03/16/23 6213)   acetaminophen **OR** acetaminophen, dextrose, HYDROcodone-acetaminophen

## 2023-03-16 NOTE — Procedures (Signed)
 I have reviewed the PD prescription and made appropriate changes as needed.  Vinson Moselle MD  [facility] 03/16/2023, 12:54 PM

## 2023-03-16 NOTE — ED Notes (Signed)
 Pt back from MRI

## 2023-03-16 NOTE — H&P (Signed)
 History and Physical    Samuel Reed:[id number] DOB: 08-27-1960 DOA: 03/15/2023  PCP: Karie Schwalbe, MD  Patient coming from: Home  Chief Complaint: Right foot wound  HPI: Samuel Reed is a 63 y.o. male with medical history significant of ESRD on PD, chronic HFpEF (EF 60-65% and G3DD 02/2021), hypertension, hyperlipidemia, insulin-dependent type 2 diabetes, OSA on CPAP, anemia sent to the ED by podiatry due to concern for infected ulcer on the plantar surface of the right foot.  Patient was started on doxycycline 100 mg twice daily over the weekend.  Seen at podiatry office yesterday and underwent I&D.  Wound cultures were sent to pathology.  Due to malodorous drainage, patient was sent to the ED for hospital admission and MRI.  Podiatry will follow inpatient.  Vital signs stable, afebrile.  Labs showing WBC count 11.6, hemoglobin 11.9 (stable), potassium 4.0, bicarb 19, glucose 97.  X-ray without signs of osteomyelitis. Patient was given fentanyl, Norco, vancomycin, and cefepime.  [facility] called to admit.  Patient states he noticed an ulcer at the bottom of his right foot 4 days ago and was prescribed doxycycline by his podiatrist.  Samuel Reed he went to see his podiatrist who sent him to the ED for hospital admission due to concern for foot infection.  He denies fevers or chills.  Not having any pain in his foot at this time.  No other complaints.  Denies cough, shortness breath, chest pain, vomiting, abdominal pain, or diarrhea.  Review of Systems:  Review of Systems  All other systems reviewed and are negative.   Past Medical History:  Diagnosis Date   Allergy    Anemia    CHF (congestive heart failure) (HCC)    Chronic venous insufficiency    CKD (chronic kidney disease), stage III (HCC)    Complication of anesthesia    patient was still awake when he was being intubated   Diabetes mellitus type II 2003   HLD (hyperlipidemia)    HTN (hypertension) 1980's   Neuromuscular  disorder (HCC)    neuropathy feet    OSA (obstructive sleep apnea)    Pericardial effusion    S/P pericardial window 01/16/2019   Sleep apnea    wear cpap     Past Surgical History:  Procedure Laterality Date   "Bone removal" from back  1987   AV FISTULA PLACEMENT Right 04/02/2021   Procedure: RIGHT RADIOCEPHALIC  ARTERIOVENOUS (AV) FISTULA CREATION;  Surgeon: Nada Libman, MD;  [facility]: [facility];  Service: Vascular;  Laterality: Right;   callous removal Right    CAPD INSERTION N/A 04/02/2021   Procedure: LAPAROSCOPIC INSERTION CONTINUOUS AMBULATORY PERITONEAL DIALYSIS  (CAPD) CATHETER;  Surgeon: Nada Libman, MD;  Location: [facility];  Service: Vascular;  Laterality: N/A;   CARDIAC CATHETERIZATION     CARDIAC CATHETERIZATION N/A 02/12/2015   Procedure: Left Heart Cath and Coronary Angiography;  Surgeon: Yates Decamp, MD;  Location: [facility] INVASIVE CV LAB;  Service: Cardiovascular;  Laterality: N/A;   COLONOSCOPY     COLONOSCOPY WITH PROPOFOL N/A 10/06/2021   Procedure: COLONOSCOPY WITH PROPOFOL;  Surgeon: Beverley Fiedler, MD;  Location: [facility];  Service: Gastroenterology;  Laterality: N/A;  CKD, on dialysis   KNEE CARTILAGE SURGERY     left knee   LUMBAR DISC SURGERY  1986   PERICARDIOCENTESIS N/A 01/16/2019   Procedure: PERICARDIOCENTESIS;  Surgeon: Lennette Bihari, MD;  Location: [facility] INVASIVE CV LAB;  Service: Cardiovascular;  Laterality: N/A;   POLYPECTOMY  POLYPECTOMY  10/06/2021   Procedure: POLYPECTOMY;  Surgeon: Beverley Fiedler, MD;  Location: [facility];  Service: Gastroenterology;;   VIDEO ASSISTED THORACOSCOPY Right 01/17/2019   Procedure: VIDEO ASSISTED THORACOSCOPY, pericardial window for pericardium and pericardial fluid.;  Surgeon: Corliss Skains, MD;  Location: [facility] OR;  Service: Thoracic;  Laterality: Right;     reports that he quit smoking about 34 years ago. His smoking use included pipe. He has never been exposed to tobacco smoke. He has never used  smokeless tobacco. He reports that he does not drink alcohol and does not use drugs.  No Known Allergies  Family History  Problem Relation Age of Onset   COPD Father    Cancer Father        prostate   Prostate cancer Father    Diabetes Mother    Coronary artery disease Paternal Grandfather    Colon cancer Neg Hx    Colon polyps Neg Hx    Esophageal cancer Neg Hx    Rectal cancer Neg Hx    Stomach cancer Neg Hx     Prior to Admission medications   Medication Sig Start Date End Date Taking? Authorizing Provider  amLODipine (NORVASC) 10 MG tablet TAKE 1 TABLET BY MOUTH EVERY DAY 01/28/23   Karie Schwalbe, MD  cholecalciferol (VITAMIN D) 25 MCG (1000 UNIT) tablet Take 2,000 Units by mouth daily with breakfast.    [provider]  Continuous Glucose Sensor (FREESTYLE LIBRE 3 PLUS SENSOR) MISC Inject 1 Device into the skin continuous. Change every 15 days 10/13/22   Carlus Pavlov, MD  Continuous Glucose Sensor (FREESTYLE LIBRE 3 SENSOR) MISC USE 1 SENSOR EVERY 14 DAYS 10/12/22   Carlus Pavlov, MD  doxycycline (VIBRA-TABS) 100 MG tablet Take 1 tablet (100 mg total) by mouth 2 (two) times daily. 03/14/23   Felecia Shelling, DPM  fluticasone (FLONASE) 50 MCG/ACT nasal spray PLACE 1 SPRAY INTO BOTH NOSTRILS 2 (TWO) TIMES DAILY. Patient taking differently: Place 2 sprays into both nostrils 2 (two) times daily. 09/26/18   Karie Schwalbe, MD  gentamicin cream (GARAMYCIN) 0.1 % Apply 1 application topically 2 (two) times daily. Patient taking differently: Apply 1 application  topically daily. 02/26/21   Felecia Shelling, DPM  insulin glargine (LANTUS SOLOSTAR) 100 UNIT/ML Solostar Pen Inject 65 Units into the skin daily. 01/22/23   Carlus Pavlov, MD  insulin lispro (HUMALOG KWIKPEN) 100 UNIT/ML KwikPen INJECT 10-20 UNITS TOTAL INTO THE SKIN 3 (THREE) TIMES DAILY BEFORE MEALS. 07/14/22   Carlus Pavlov, MD  Insulin Pen Needle (BD PEN NEEDLE NANO 2ND GEN) 32G X 4 MM MISC USE TO  INJECT INSULIN 4 TIMES A DAY 07/10/22   Carlus Pavlov, MD  losartan (COZAAR) 100 MG tablet Take 100 mg by mouth at bedtime. 06/16/21   [provider]  metoprolol tartrate (LOPRESSOR) 100 MG tablet TAKE 1 TABLET BY MOUTH TWICE A DAY 01/28/23   Tillman Abide I, MD  potassium chloride SA (KLOR-CON M) 20 MEQ tablet Take 20 mEq by mouth at bedtime.    [provider]  PRESCRIPTION MEDICATION CPAP: At bedtime    [provider]  rosuvastatin (CRESTOR) 20 MG tablet TAKE 1 TABLET BY MOUTH EVERY DAY 01/28/23   Karie Schwalbe, MD  sevelamer carbonate (RENVELA) 800 MG tablet Take 800 mg by mouth 3 (three) times daily with meals. 09/23/21   [provider]  tirzepatide Greggory Keen) 5 MG/0.5ML Pen Inject 5 mg into the skin once a week.  02/16/23   Carlus Pavlov, MD  torsemide (DEMADEX) 100 MG tablet Take 100 mg by mouth daily. 04/22/21   [provider]    Physical Exam: Vitals:   03/15/23 1553 03/15/23 1558 03/15/23 1949 03/15/23 2342  BP: 126/60  (!) 124/57 139/74  Pulse: 75  67 73  Resp: 16  16 18   Temp: 98.2 F (36.8 C)  98.1 F (36.7 C) 98.8 F (37.1 C)  TempSrc:   Oral   SpO2: 100%  100% 100%  Weight:  111.6 kg    Height:  6' (1.829 m)      Physical Exam Vitals reviewed.  Constitutional:      General: He is not in acute distress. HENT:     Head: Normocephalic and atraumatic.  Eyes:     Extraocular Movements: Extraocular movements intact.  Cardiovascular:     Rate and Rhythm: Normal rate and regular rhythm.     Pulses: Normal pulses.  Pulmonary:     Effort: Pulmonary effort is normal. No respiratory distress.     Breath sounds: Normal breath sounds. No wheezing or rales.  Abdominal:     General: Bowel sounds are normal. There is no distension.     Palpations: Abdomen is soft.     Tenderness: There is no abdominal tenderness. There is no guarding.  Musculoskeletal:     Cervical back: Normal range of motion.     Right lower leg: No  edema.     Left lower leg: No edema.     Comments: Right foot: Ulcer on the plantar surface of the foot, see image.  Dorsalis pedis pulse intact and foot warm to touch.  Skin:    General: Skin is warm and dry.  Neurological:     General: No focal deficit present.     Mental Status: He is alert and oriented to person, place, and time.      Labs on Admission: I have personally reviewed following labs and imaging studies  CBC: Recent Labs  Lab 03/15/23 1618  WBC 11.6*  HGB 11.9*  HCT 35.4*  MCV 89.8  PLT 290   Basic Metabolic Panel: Recent Labs  Lab 03/15/23 1618  NA 136  K 4.0  CL 106  CO2 19*  GLUCOSE 97  BUN 74*  CREATININE 7.38*  CALCIUM 8.7*   GFR: Estimated Creatinine Clearance: 13.6 mL/min (A) (by C-G formula based on SCr of 7.38 mg/dL (H)). Liver Function Tests: No results for input(s): "AST", "ALT", "ALKPHOS", "BILITOT", "PROT", "ALBUMIN" in the last 168 hours. No results for input(s): "LIPASE", "AMYLASE" in the last 168 hours. No results for input(s): "AMMONIA" in the last 168 hours. Coagulation Profile: No results for input(s): "INR", "PROTIME" in the last 168 hours. Cardiac Enzymes: No results for input(s): "CKTOTAL", "CKMB", "CKMBINDEX", "TROPONINI" in the last 168 hours. BNP (last 3 results) No results for input(s): "PROBNP" in the last 8760 hours. HbA1C: No results for input(s): "HGBA1C" in the last 72 hours. CBG: Recent Labs  Lab 03/15/23 1623 03/15/23 1852  GLUCAP 88 84   Lipid Profile: No results for input(s): "CHOL", "HDL", "LDLCALC", "TRIG", "CHOLHDL", "LDLDIRECT" in the last 72 hours. Thyroid Function Tests: No results for input(s): "TSH", "T4TOTAL", "FREET4", "T3FREE", "THYROIDAB" in the last 72 hours. Anemia Panel: No results for input(s): "VITAMINB12", "FOLATE", "FERRITIN", "TIBC", "IRON", "RETICCTPCT" in the last 72 hours. Urine analysis:    Component Value Date/Time   COLORURINE STRAW (A) 03/10/2021 0623   APPEARANCEUR CLEAR  03/10/2021 0623   LABSPEC  1.008 03/10/2021 0623   PHURINE 5.0 03/10/2021 0623   GLUCOSEU NEGATIVE 03/10/2021 0623   HGBUR SMALL (A) 03/10/2021 0623   HGBUR negative 04/05/2009 1451   BILIRUBINUR NEGATIVE 03/10/2021 0623   KETONESUR NEGATIVE 03/10/2021 0623   PROTEINUR 100 (A) 03/10/2021 0623   UROBILINOGEN 0.2 04/15/2012 1415   NITRITE NEGATIVE 03/10/2021 0623   LEUKOCYTESUR NEGATIVE 03/10/2021 0623    Radiological Exams on Admission: DG Foot Complete Right Result Date: 03/15/2023 CLINICAL DATA:  Foot wound EXAM: RIGHT FOOT COMPLETE - 3+ VIEW COMPARISON:  03/15/2023, 01/19/2023 FINDINGS: No fracture or malalignment. Similar osseous bridging across the third through fifth proximal metatarsals. Similar tarsal degenerative changes. Moderate plantar calcaneal spur. Wound plantar aspect of the distal foot, no acute osseous destructive change. No soft tissue emphysema IMPRESSION: Wound plantar aspect of the distal foot. No acute osseous destructive change. Electronically Signed   By: Jasmine Pang M.D.   On: 03/15/2023 22:03    Assessment and Plan  Infected diabetic right foot ulcer Labs showing borderline leukocytosis.  No fever, tachycardia, or signs of sepsis.  Dorsalis pedis pulse intact and foot warm to touch.  X-ray without signs of osteomyelitis.  MRI has been ordered for further evaluation.  Continue antibiotic coverage with vancomycin, cefepime, and metronidazole.  Trend WBC count and follow-up wound cultures.  Keep n.p.o. after midnight.  Podiatry will consult.  ESRD on PD No signs of volume overload.  Potassium within normal range.  Bicarb mildly low and replacement ordered.  Consult nephrology in the morning.  Insulin-dependent type 2 diabetes Last A1c 6.7 in August 2024, repeat ordered.  Placed on very sensitive sliding scale insulin every 4 hours as patient is n.p.o. after midnight.  OSA Continue nightly CPAP.  Chronic anemia Hemoglobin stable, continue to monitor  labs.  Hypertension: Blood pressure stable. Hyperlipidemia Pharmacy med rec pending.  DVT prophylaxis: SCDs Code Status: Full Code (discussed with the patient) Level of care: Telemetry bed Admission status: It is my clinical opinion that admission to INPATIENT is reasonable and necessary because of the expectation that this patient will require hospital care that crosses at least 2 midnights to treat this condition based on the medical complexity of the problems presented.  Given the aforementioned information, the predictability of an adverse outcome is felt to be significant.  John Giovanni MD [facility]  If 7PM-7AM, please contact night-coverage [email or web address]  03/16/2023, 3:11 AM

## 2023-03-16 NOTE — ED Notes (Signed)
 Patient transported to MRI

## 2023-03-16 NOTE — ED Notes (Signed)
 ED TO INPATIENT HANDOFF REPORT  ED Nurse Name and Phone #: Gillis Ends #[phone]  S Name/Age/Gender Samuel Reed 63 y.o. male Room/Bed: H011C/H011C  Code Status   Code Status: Full Code  Home/SNF/Other Home Patient oriented to: self, place, time, and situation Is this baseline? Yes   Triage Complete: Triage complete  Chief Complaint Diabetic foot infection (HCC) [B28.413, L08.9]  Triage Note Pt sent from podiatrist for workup of R foot wound. Pt states that he had a callous removed about a month ago and now thinks there may be infection. H/x diabetes   Allergies No Known Allergies  Level of Care/Admitting Diagnosis ED Disposition     ED Disposition  Admit   Condition  --   Comment  Hospital Area: [facility] [[id number]]  Level of Care: Telemetry Medical [[id number]]  May admit patient to [facility] or [facility] if equivalent level of care is available:: Yes  Covid Evaluation: Asymptomatic - no recent exposure (last 10 days) testing not required  Diagnosis: Diabetic foot infection [facility]) [[id number]]  Admitting Physician: John Giovanni [[id number]]  Attending Physician: John Giovanni [[id number]]  Certification:: I certify this patient will need inpatient services for at least 2 midnights  Expected Medical Readiness: 03/18/2023          B Medical/Surgery History Past Medical History:  Diagnosis Date   Allergy    Anemia    CHF (congestive heart failure) (HCC)    Chronic venous insufficiency    CKD (chronic kidney disease), stage III (HCC)    Complication of anesthesia    patient was still awake when he was being intubated   Diabetes mellitus type II 2003   HLD (hyperlipidemia)    HTN (hypertension) 1980's   Neuromuscular disorder (HCC)    neuropathy feet    OSA (obstructive sleep apnea)    Pericardial effusion    S/P pericardial window 01/16/2019   Sleep apnea    wear cpap    Past Surgical History:  Procedure Laterality Date   "Bone removal"  from back  1987   AV FISTULA PLACEMENT Right 04/02/2021   Procedure: RIGHT RADIOCEPHALIC  ARTERIOVENOUS (AV) FISTULA CREATION;  Surgeon: Nada Libman, MD;  Location: [facility] OR;  Service: Vascular;  Laterality: Right;   callous removal Right    CAPD INSERTION N/A 04/02/2021   Procedure: LAPAROSCOPIC INSERTION CONTINUOUS AMBULATORY PERITONEAL DIALYSIS  (CAPD) CATHETER;  Surgeon: Nada Libman, MD;  Location: [facility] OR;  Service: Vascular;  Laterality: N/A;   CARDIAC CATHETERIZATION     CARDIAC CATHETERIZATION N/A 02/12/2015   Procedure: Left Heart Cath and Coronary Angiography;  Surgeon: Yates Decamp, MD;  Location: [facility] INVASIVE CV LAB;  Service: Cardiovascular;  Laterality: N/A;   COLONOSCOPY     COLONOSCOPY WITH PROPOFOL N/A 10/06/2021   Procedure: COLONOSCOPY WITH PROPOFOL;  Surgeon: Beverley Fiedler, MD;  Location: [facility];  Service: Gastroenterology;  Laterality: N/A;  CKD, on dialysis   KNEE CARTILAGE SURGERY     left knee   LUMBAR DISC SURGERY  1986   PERICARDIOCENTESIS N/A 01/16/2019   Procedure: PERICARDIOCENTESIS;  Surgeon: Lennette Bihari, MD;  Location: [facility] INVASIVE CV LAB;  Service: Cardiovascular;  Laterality: N/A;   POLYPECTOMY     POLYPECTOMY  10/06/2021   Procedure: POLYPECTOMY;  Surgeon: Beverley Fiedler, MD;  Location: [facility];  Service: Gastroenterology;;   VIDEO ASSISTED THORACOSCOPY Right 01/17/2019   Procedure: VIDEO ASSISTED THORACOSCOPY, pericardial window for pericardium and pericardial fluid.;  Surgeon: Corliss Skains,  MD;  Location: [facility] OR;  Service: Thoracic;  Laterality: Right;     A IV Location/Drains/Wounds Patient Lines/Drains/Airways Status     Active Line/Drains/Airways     Name Placement date Placement time Site Days   Peripheral IV 03/15/23 20 G Right Antecubital 03/15/23  2300  Antecubital  1   Fistula / Graft Right Forearm Arteriovenous fistula 04/02/21  1216  Forearm  713   Wound / Incision (Open or Dehisced) 01/13/17 Non-pressure wound Foot  Left 01/13/17  2100  Foot  2253   Wound / Incision (Open or Dehisced) 03/10/21 Other (Comment) Heel Left 03/10/21  1600  Heel  736            Intake/Output Last 24 hours  Intake/Output Summary (Last 24 hours) at 03/16/2023 0529 Last data filed at 03/16/2023 0106 Gross per 24 hour  Intake 500 ml  Output --  Net 500 ml    Labs/Imaging Results for orders placed or performed during the hospital encounter of 03/15/23 (from the past 48 hours)  Basic metabolic panel     Status: Abnormal   Collection Time: 03/15/23  4:18 PM  Result Value Ref Range   Sodium 136 135 - 145 mmol/L   Potassium 4.0 3.5 - 5.1 mmol/L   Chloride 106 98 - 111 mmol/L   CO2 19 (L) 22 - 32 mmol/L   Glucose, Bld 97 70 - 99 mg/dL    Comment: Glucose reference range applies only to samples taken after fasting for at least 8 hours.   BUN 74 (H) 8 - 23 mg/dL   Creatinine, Ser 8.11 (H) 0.61 - 1.24 mg/dL   Calcium 8.7 (L) 8.9 - 10.3 mg/dL   GFR, Estimated 8 (L) >60 mL/min    Comment: (NOTE) Calculated using the CKD-EPI Creatinine Equation (2021)    Anion gap 11 5 - 15    Comment: Performed at [facility], [address], [place], [place] [address]  CBC     Status: Abnormal   Collection Time: 03/15/23  4:18 PM  Result Value Ref Range   WBC 11.6 (H) 4.0 - 10.5 K/uL   RBC 3.94 (L) 4.22 - 5.81 MIL/uL   Hemoglobin 11.9 (L) 13.0 - 17.0 g/dL   HCT 29.5 (L) 62.1 - 30.8 %   MCV 89.8 80.0 - 100.0 fL   MCH 30.2 26.0 - 34.0 pg   MCHC 33.6 30.0 - 36.0 g/dL   RDW 65.7 84.6 - 96.2 %   Platelets 290 150 - 400 K/uL   nRBC 0.0 0.0 - 0.2 %    Comment: Performed at [facility], [address] [place], [place] [address]  POC CBG, ED     Status: None   Collection Time: 03/15/23  4:23 PM  Result Value Ref Range   Glucose-Capillary 88 70 - 99 mg/dL    Comment: Glucose reference range applies only to samples taken after fasting for at least 8 hours.  CBG monitoring, ED     Status: None   Collection Time: 03/15/23   6:52 PM  Result Value Ref Range   Glucose-Capillary 84 70 - 99 mg/dL    Comment: Glucose reference range applies only to samples taken after fasting for at least 8 hours.  CBC     Status: Abnormal   Collection Time: 03/16/23  4:10 AM  Result Value Ref Range   WBC 11.0 (H) 4.0 - 10.5 K/uL   RBC 3.82 (L) 4.22 - 5.81 MIL/uL   Hemoglobin  11.4 (L) 13.0 - 17.0 g/dL   HCT 16.1 (L) 09.6 - 04.5 %   MCV 91.1 80.0 - 100.0 fL   MCH 29.8 26.0 - 34.0 pg   MCHC 32.8 30.0 - 36.0 g/dL   RDW 40.9 81.1 - 91.4 %   Platelets 266 150 - 400 K/uL   nRBC 0.0 0.0 - 0.2 %    Comment: Performed at [facility], [address] [place], [place] [address]  Comprehensive metabolic panel     Status: Abnormal   Collection Time: 03/16/23  4:10 AM  Result Value Ref Range   Sodium 138 135 - 145 mmol/L   Potassium 4.6 3.5 - 5.1 mmol/L   Chloride 109 98 - 111 mmol/L   CO2 17 (L) 22 - 32 mmol/L   Glucose, Bld 111 (H) 70 - 99 mg/dL    Comment: Glucose reference range applies only to samples taken after fasting for at least 8 hours.   BUN 78 (H) 8 - 23 mg/dL   Creatinine, Ser 6.21 (H) 0.61 - 1.24 mg/dL   Calcium 8.8 (L) 8.9 - 10.3 mg/dL   Total Protein 6.6 6.5 - 8.1 g/dL   Albumin 3.0 (L) 3.5 - 5.0 g/dL   AST 11 (L) 15 - 41 U/L   ALT 19 0 - 44 U/L   Alkaline Phosphatase 62 38 - 126 U/L   Total Bilirubin 0.8 0.0 - 1.2 mg/dL   GFR, Estimated 7 (L) >60 mL/min    Comment: (NOTE) Calculated using the CKD-EPI Creatinine Equation (2021)    Anion gap 12 5 - 15    Comment: Performed at [facility], [address] [place], [place] [address]  Hemoglobin A1c     Status: Abnormal   Collection Time: 03/16/23  4:10 AM  Result Value Ref Range   Hgb A1c MFr Bld 6.1 (H) 4.8 - 5.6 %    Comment: (NOTE) Pre diabetes:          5.7%-6.4%  Diabetes:              >6.4%  Glycemic control for   <7.0% adults with diabetes    Mean Plasma Glucose 128.37 mg/dL    Comment: Performed at [facility], [address] [place], [place] [address]   DG Foot Complete Right Result Date: 03/15/2023 CLINICAL DATA:  Foot wound EXAM: RIGHT FOOT COMPLETE - 3+ VIEW COMPARISON:  03/15/2023, 01/19/2023 FINDINGS: No fracture or malalignment. Similar osseous bridging across the third through fifth proximal metatarsals. Similar tarsal degenerative changes. Moderate plantar calcaneal spur. Wound plantar aspect of the distal foot, no acute osseous destructive change. No soft tissue emphysema IMPRESSION: Wound plantar aspect of the distal foot. No acute osseous destructive change. Electronically Signed   By: Jasmine Pang M.D.   On: 03/15/2023 22:03    Pending Labs Unresulted Labs (From admission, onward)     Start     Ordered   03/16/23 0355  HIV Antibody (routine testing w rflx)  (HIV Antibody (Routine testing w reflex) panel)  Once,   R        03/16/23 0356            Vitals/Pain Today's Vitals   03/15/23 1949 03/15/23 2342 03/16/23 0200 03/16/23 0300  BP: (!) 124/57 139/74    Pulse: 67 73    Resp: 16 18    Temp: 98.1 F (36.7 C) 98.8 F (37.1 C)  98.5 F (36.9 C)  TempSrc: Oral  SpO2: 100% 100%    Weight:      Height:      PainSc:   4      Isolation Precautions No active isolations  Medications Medications  metroNIDAZOLE (FLAGYL) IVPB 500 mg (has no administration in time range)  acetaminophen (TYLENOL) tablet 650 mg (has no administration in time range)    Or  acetaminophen (TYLENOL) suppository 650 mg (has no administration in time range)  insulin aspart (novoLOG) injection 0-6 Units (has no administration in time range)  sodium bicarbonate injection 25 mEq (has no administration in time range)  HYDROcodone-acetaminophen (NORCO/VICODIN) 5-325 MG per tablet 1 tablet (1 tablet Oral Given 03/15/23 1618)  ceFEPIme (MAXIPIME) 2 g in sodium chloride 0.9 % 100 mL IVPB (0 g Intravenous Stopped 03/15/23 2337)  vancomycin (VANCOREADY) IVPB 2000 mg/400 mL (0 mg Intravenous Stopped 03/16/23 0106)   fentaNYL (SUBLIMAZE) injection 50 mcg (50 mcg Intravenous Given 03/15/23 2317)  gadobutrol (GADAVIST) 1 MMOL/ML injection 10 mL (10 mLs Intravenous Contrast Given 03/16/23 0504)    Mobility walks     Focused Assessments     R Recommendations: See Admitting Provider Note  Report given to:   Additional Notes: peritoneal Dialysis

## 2023-03-16 NOTE — Progress Notes (Signed)
 Pharmacy Antibiotic Note  CAIGE ALMEDA is a 63 y.o. male admitted on 03/15/2023 with diabetic foot infection. PMH significant for ESRD on PD. Pt noticed an ulcer at the bottom of his right foot 4 days ago and was prescribed doxycycline by his podiatrist on 2/16. On 2/17 podiatrist referred pt to ED. Pharmacy has been consulted for cefepime and vancomycin dosing.  Plan: Cefepime 1g q24h Vancomycin dose per levels (goal >15 mcg/ml) F/u nephrology recs, infectious work up and narrow as able Vancomycin levels as needed  Height: 6' (182.9 cm) Weight: 111.6 kg (246 lb) IBW/kg (Calculated) : 77.6  Temp (24hrs), Avg:98.4 F (36.9 C), Min:98.1 F (36.7 C), Max:98.8 F (37.1 C)  Recent Labs  Lab 03/15/23 1618  WBC 11.6*  CREATININE 7.38*    Estimated Creatinine Clearance: 13.6 mL/min (A) (by C-G formula based on SCr of 7.38 mg/dL (H)).    No Known Allergies  Antimicrobials this admission: Cefepime 2/17> Vancomycin 2/17>  Thank you for allowing pharmacy to be a part of this patient's care.  Marja Kays 03/16/2023 4:14 AM

## 2023-03-16 NOTE — Progress Notes (Signed)
 Courtesy note- No billing  Patient is seen and examined today morning. He is admitted early morning for diabetic right foot wound. He did complain of right foot pain, tylenol not helping.  Blood sugars controlled. Podiatry evaluated him and plan is for OR tomorrow if MRI foot is positive for osteomyelitis. Continue broad spectrum antibiotics Vancomycin and Cefepime per pharmacy protocol. I called Dr. Arlean Hopping for his peritoneal dialysis. He did not get it last night.  Also started him on norco for severe pain.  Further management as per MRI results and surgical plan by podiatry. He understands and agrees.

## 2023-03-16 NOTE — Plan of Care (Signed)
  Problem: Education: Goal: Ability to describe self-care measures that may prevent or decrease complications (Diabetes Survival Skills Education) will improve Outcome: Progressing   Problem: Metabolic: Goal: Ability to maintain appropriate glucose levels will improve Outcome: Progressing   Problem: Skin Integrity: Goal: Risk for impaired skin integrity will decrease Outcome: Progressing   Problem: Clinical Measurements: Goal: Ability to maintain clinical measurements within normal limits will improve Outcome: Progressing   Problem: Activity: Goal: Risk for activity intolerance will decrease Outcome: Progressing   Problem: Pain Managment: Goal: General experience of comfort will improve and/or be controlled Outcome: Progressing   Problem: Safety: Goal: Ability to remain free from injury will improve Outcome: Progressing

## 2023-03-16 NOTE — Consult Note (Signed)
 PODIATRY CONSULTATION  NAME Samuel Reed MRN [id number] DOB Aug 20, 1960 DOA 03/15/2023   Reason for consult:  Ulcer of right foot  Attending/Consulting physician: Ricki Miller MD  History of present illness: "Samuel Reed is a 63 y.o. male with medical history significant of ESRD on PD, chronic HFpEF (EF 60-65% and G3DD 02/2021), hypertension, hyperlipidemia, insulin-dependent type 2 diabetes, OSA on CPAP, anemia sent to the ED by podiatry due to concern for infected ulcer on the plantar surface of the right foot.  Patient was started on doxycycline 100 mg twice daily over the weekend.  Seen at podiatry office yesterday and underwent I&D.  Wound cultures were sent to pathology.  Due to malodorous drainage, patient was sent to the ED for hospital admission and MRI.  Podiatry will follow inpatient.  Vital signs stable, afebrile.  Labs showing WBC count 11.6, hemoglobin 11.9 (stable), potassium 4.0, bicarb 19, glucose 97.  X-ray without signs of osteomyelitis. Patient was given fentanyl, Norco, vancomycin, and cefepime.  TRH called to admit."  Patient tells me that he has been following with Dr. Logan Bores.  He felt like the small callus was improving and had healed up.  Was surprised that it got infected so quickly.  Hoping to avoid surgery if possible.  He does have diminished sensation at the area and says it did not bleed well during the debridement in the office.  Past Medical History:  Diagnosis Date   Allergy    Anemia    CHF (congestive heart failure) (HCC)    Chronic venous insufficiency    CKD (chronic kidney disease), stage III (HCC)    Complication of anesthesia    patient was still awake when he was being intubated   Diabetes mellitus type II 2003   HLD (hyperlipidemia)    HTN (hypertension) 1980's   Neuromuscular disorder (HCC)    neuropathy feet    OSA (obstructive sleep apnea)    Pericardial effusion    S/P pericardial window 01/16/2019   Sleep apnea    wear cpap         Latest Ref Rng & Units 03/16/2023    4:10 AM 03/15/2023    4:18 PM 04/02/2021    8:48 AM  CBC  WBC 4.0 - 10.5 K/uL 11.0  11.6    Hemoglobin 13.0 - 17.0 g/dL 40.9  81.1  91.4   Hematocrit 39.0 - 52.0 % 34.8  35.4  35.0   Platelets 150 - 400 K/uL 266  290         Latest Ref Rng & Units 03/16/2023    4:10 AM 03/15/2023    4:18 PM 04/02/2021    8:48 AM  BMP  Glucose 70 - 99 mg/dL 782  97  956   BUN 8 - 23 mg/dL 78  74  >213   Creatinine 0.61 - 1.24 mg/dL 0.86  5.78  4.69   Sodium 135 - 145 mmol/L 138  136  142   Potassium 3.5 - 5.1 mmol/L 4.6  4.0  3.3   Chloride 98 - 111 mmol/L 109  106  97   CO2 22 - 32 mmol/L 17  19    Calcium 8.9 - 10.3 mg/dL 8.8  8.7        Physical Exam: Lower Extremity Exam Vasc: R - PT palpable, DP palpable. Cap refill < 3 sec to digits  L - PT palpable, DP palpable. Cap refill <3 sec to digits  Derm: R -ulceration plantar aspect fifth metatarsal  head to subcutaneous fat tissue with some fibrotic material and necrosis of the tissue bed surrounding hyperkeratotic macerated tissue and some erythema   L - Normal temp/texture/turgor with no open lesion or clinical signs of infection    MSK:  R -  No gross deformities. Compartments soft, non-tender, compressible  L -  No gross deformities. Compartments soft, non-tender, compressible  Neuro: R - Gross sensation diminished. Gross motor function intact   L - Gross sensation diminished. Gross motor function intact    ASSESSMENT/PLAN OF CARE 63 y.o. 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 past midnight for OR tomorrow plan for either bone biopsy and  debridement of ulceration versus fifth metatarsal head resection.  - Continue IV abx broad spectrum pending further culture data - Anticoagulation: Hold pending or - Wound care: Leave dressing clean dry and intact until surgery - WB status: Weightbearing as tolerated right foot - Will continue to follow   Thank you for the consult.  Please contact me directly with any questions or concerns.           Corinna Gab, DPM [facility] / [facility]    [address] [place], [place] [address]                Office [phone]  Fax [phone]

## 2023-03-17 ENCOUNTER — Encounter (HOSPITAL_COMMUNITY): Payer: Self-pay | Admitting: Internal Medicine

## 2023-03-17 ENCOUNTER — Inpatient Hospital Stay (HOSPITAL_COMMUNITY): Payer: Medicare Other | Admitting: Anesthesiology

## 2023-03-17 ENCOUNTER — Other Ambulatory Visit: Payer: Self-pay

## 2023-03-17 ENCOUNTER — Inpatient Hospital Stay (HOSPITAL_COMMUNITY): Payer: Medicare Other

## 2023-03-17 ENCOUNTER — Encounter (HOSPITAL_COMMUNITY)
Admission: EM | Disposition: A | Discharge status: Home / Self Care | Payer: Self-pay | Source: Ambulatory Visit | Attending: Internal Medicine

## 2023-03-17 DIAGNOSIS — M869 Osteomyelitis, unspecified: Secondary | ICD-10-CM

## 2023-03-17 DIAGNOSIS — Z794 Long term (current) use of insulin: Secondary | ICD-10-CM

## 2023-03-17 DIAGNOSIS — I10 Essential (primary) hypertension: Secondary | ICD-10-CM

## 2023-03-17 DIAGNOSIS — L97513 Non-pressure chronic ulcer of other part of right foot with necrosis of muscle: Secondary | ICD-10-CM | POA: Diagnosis not present

## 2023-03-17 DIAGNOSIS — E1169 Type 2 diabetes mellitus with other specified complication: Secondary | ICD-10-CM

## 2023-03-17 DIAGNOSIS — E11628 Type 2 diabetes mellitus with other skin complications: Secondary | ICD-10-CM | POA: Diagnosis not present

## 2023-03-17 DIAGNOSIS — L089 Local infection of the skin and subcutaneous tissue, unspecified: Secondary | ICD-10-CM | POA: Diagnosis not present

## 2023-03-17 DIAGNOSIS — M86171 Other acute osteomyelitis, right ankle and foot: Secondary | ICD-10-CM | POA: Diagnosis not present

## 2023-03-17 HISTORY — PX: METATARSAL HEAD EXCISION: SHX5027

## 2023-03-17 LAB — GLUCOSE, CAPILLARY
Glucose-Capillary: 117 mg/dL — ABNORMAL HIGH (ref 70–99)
Glucose-Capillary: 118 mg/dL — ABNORMAL HIGH (ref 70–99)
Glucose-Capillary: 119 mg/dL — ABNORMAL HIGH (ref 70–99)
Glucose-Capillary: 126 mg/dL — ABNORMAL HIGH (ref 70–99)
Glucose-Capillary: 133 mg/dL — ABNORMAL HIGH (ref 70–99)
Glucose-Capillary: 149 mg/dL — ABNORMAL HIGH (ref 70–99)
Glucose-Capillary: 155 mg/dL — ABNORMAL HIGH (ref 70–99)
Glucose-Capillary: 175 mg/dL — ABNORMAL HIGH (ref 70–99)

## 2023-03-17 LAB — BASIC METABOLIC PANEL
Anion gap: 17 — ABNORMAL HIGH (ref 5–15)
BUN: 74 mg/dL — ABNORMAL HIGH (ref 8–23)
CO2: 18 mmol/L — ABNORMAL LOW (ref 22–32)
Calcium: 9.3 mg/dL (ref 8.9–10.3)
Chloride: 104 mmol/L (ref 98–111)
Creatinine, Ser: 7 mg/dL — ABNORMAL HIGH (ref 0.61–1.24)
GFR, Estimated: 8 mL/min — ABNORMAL LOW (ref >60)
Glucose, Bld: 141 mg/dL — ABNORMAL HIGH (ref 70–99)
Potassium: 4.4 mmol/L (ref 3.5–5.1)
Sodium: 139 mmol/L (ref 135–145)

## 2023-03-17 SURGERY — EXCISION, METATARSAL BONE, HEAD
Anesthesia: Monitor Anesthesia Care | Site: Toe | Laterality: Right

## 2023-03-17 MED ORDER — LIDOCAINE HCL (PF) 1 % IJ SOLN
INTRAMUSCULAR | Status: AC
  Filled 2023-03-17: qty 30

## 2023-03-17 MED ORDER — PROPOFOL 1000 MG/100ML IV EMUL
INTRAVENOUS | Status: AC
  Filled 2023-03-17: qty 100

## 2023-03-17 MED ORDER — LIDOCAINE 2% (20 MG/ML) 5 ML SYRINGE
INTRAMUSCULAR | Status: AC
  Filled 2023-03-17: qty 10

## 2023-03-17 MED ORDER — PROPOFOL 10 MG/ML IV BOLUS
INTRAVENOUS | Status: AC
  Filled 2023-03-17: qty 20

## 2023-03-17 MED ORDER — PHENYLEPHRINE 80 MCG/ML (10ML) SYRINGE FOR IV PUSH (FOR BLOOD PRESSURE SUPPORT)
PREFILLED_SYRINGE | INTRAVENOUS | Status: AC
  Filled 2023-03-17: qty 10

## 2023-03-17 MED ORDER — ONDANSETRON HCL 4 MG/2ML IJ SOLN
INTRAMUSCULAR | Status: AC
  Filled 2023-03-17: qty 2

## 2023-03-17 MED ORDER — LIDOCAINE HCL 1 % IJ SOLN
INTRAMUSCULAR | Status: DC | PRN
  Administered 2023-03-17: 60 mL

## 2023-03-17 MED ORDER — PROPOFOL 500 MG/50ML IV EMUL
INTRAVENOUS | Status: DC | PRN
Start: 2023-03-17 — End: 2023-03-17
  Administered 2023-03-17: 105 ug/kg/min via INTRAVENOUS

## 2023-03-17 MED ORDER — MIDAZOLAM HCL 2 MG/2ML IJ SOLN
INTRAMUSCULAR | Status: AC
  Filled 2023-03-17: qty 2

## 2023-03-17 MED ORDER — BUPIVACAINE HCL (PF) 0.5 % IJ SOLN
INTRAMUSCULAR | Status: AC
  Filled 2023-03-17: qty 30

## 2023-03-17 MED ORDER — ONDANSETRON HCL 4 MG/2ML IJ SOLN
INTRAMUSCULAR | Status: DC | PRN
  Administered 2023-03-17: 4 mg via INTRAVENOUS

## 2023-03-17 MED ORDER — FENTANYL CITRATE (PF) 250 MCG/5ML IJ SOLN
INTRAMUSCULAR | Status: AC
  Filled 2023-03-17: qty 5

## 2023-03-17 MED ORDER — GENTAMICIN SULFATE 0.1 % EX CREA: 1.0000 | TOPICAL_CREAM | Freq: Every day | CUTANEOUS | Status: DC

## 2023-03-17 MED ORDER — CHLORHEXIDINE GLUCONATE 0.12 % MT SOLN
15.0000 mL | Freq: Once | OROMUCOSAL | Status: AC
  Administered 2023-03-17: 15 mL via OROMUCOSAL

## 2023-03-17 MED ORDER — PROPOFOL 10 MG/ML IV BOLUS
INTRAVENOUS | Status: DC | PRN
  Administered 2023-03-17: 40 mg via INTRAVENOUS

## 2023-03-17 MED ORDER — LACTATED RINGERS IV SOLN: INTRAVENOUS | Status: DC

## 2023-03-17 MED ORDER — 0.9 % SODIUM CHLORIDE (POUR BTL) OPTIME
TOPICAL | Status: DC | PRN
  Administered 2023-03-17: 1000 mL

## 2023-03-17 MED ORDER — DELFLEX-LC/2.5% DEXTROSE 394 MOSM/L IP SOLN: INTRAPERITONEAL | Status: DC

## 2023-03-17 MED ORDER — LIDOCAINE 2% (20 MG/ML) 5 ML SYRINGE
INTRAMUSCULAR | Status: DC | PRN
  Administered 2023-03-17: 40 mg via INTRAVENOUS

## 2023-03-17 MED ORDER — SODIUM CHLORIDE 0.9 % IV SOLN: INTRAVENOUS | Status: DC

## 2023-03-17 MED ORDER — MIDAZOLAM HCL 2 MG/2ML IJ SOLN
INTRAMUSCULAR | Status: DC | PRN
  Administered 2023-03-17: 1 mg via INTRAVENOUS

## 2023-03-17 MED ORDER — ORAL CARE MOUTH RINSE: 15.0000 mL | Freq: Once | OROMUCOSAL | Status: AC

## 2023-03-17 MED ORDER — FENTANYL CITRATE (PF) 250 MCG/5ML IJ SOLN
INTRAMUSCULAR | Status: DC | PRN
  Administered 2023-03-17: 25 ug via INTRAVENOUS

## 2023-03-17 MED ORDER — INSULIN ASPART 100 UNIT/ML IJ SOLN
0.0000 [IU] | INTRAMUSCULAR | Status: DC | PRN
Start: 2023-03-17 — End: 2023-03-17

## 2023-03-17 MED ORDER — DELFLEX-LC/1.5% DEXTROSE 344 MOSM/L IP SOLN: INTRAPERITONEAL | Status: DC

## 2023-03-17 SURGICAL SUPPLY — 43 items
BAG COUNTER SPONGE SURGICOUNT (BAG) ×2 IMPLANT
BLADE AVERAGE 25X9 (BLADE) IMPLANT
BNDG COHESIVE 6X5 TAN ST LF (GAUZE/BANDAGES/DRESSINGS) IMPLANT
BNDG ELASTIC 4INX 5YD STR LF (GAUZE/BANDAGES/DRESSINGS) IMPLANT
BNDG ESMARK 4X9 LF (GAUZE/BANDAGES/DRESSINGS) ×2 IMPLANT
BNDG GAUZE DERMACEA FLUFF 4 (GAUZE/BANDAGES/DRESSINGS) IMPLANT
CNTNR URN SCR LID CUP LEK RST (MISCELLANEOUS) IMPLANT
CORD BIPOLAR FORCEPS 12FT (ELECTRODE) ×2 IMPLANT
COTTON STERILE ROLL (GAUZE/BANDAGES/DRESSINGS) IMPLANT
COVER SURGICAL LIGHT HANDLE (MISCELLANEOUS) ×4 IMPLANT
CUFF TRNQT CYL 34X4.125X (TOURNIQUET CUFF) IMPLANT
DRAPE OEC MINIVIEW 54X84 (DRAPES) IMPLANT
DRAPE U-SHAPE 47X51 STRL (DRAPES) ×2 IMPLANT
DRSG ADAPTIC 3X8 NADH LF (GAUZE/BANDAGES/DRESSINGS) IMPLANT
DRSG XEROFORM 1X8 (GAUZE/BANDAGES/DRESSINGS) IMPLANT
DURAPREP 26ML APPLICATOR (WOUND CARE) ×2 IMPLANT
ELECT REM PT RETURN 9FT ADLT (ELECTROSURGICAL) ×1 IMPLANT
ELECTRODE REM PT RTRN 9FT ADLT (ELECTROSURGICAL) ×2 IMPLANT
GAUZE PAD ABD 8X10 STRL (GAUZE/BANDAGES/DRESSINGS) IMPLANT
GAUZE SPONGE 4X4 12PLY STRL (GAUZE/BANDAGES/DRESSINGS) IMPLANT
GLOVE BIOGEL PI IND STRL 9 (GLOVE) ×2 IMPLANT
GLOVE SURG ORTHO 9.0 STRL STRW (GLOVE) ×2 IMPLANT
GOWN STRL REUS W/ TWL XL LVL3 (GOWN DISPOSABLE) ×4 IMPLANT
GRAFT SKIN WND MICRO 38 (Tissue) IMPLANT
KIT BASIN OR (CUSTOM PROCEDURE TRAY) ×2 IMPLANT
KIT TURNOVER KIT B (KITS) ×2 IMPLANT
MANIFOLD NEPTUNE II (INSTRUMENTS) ×2 IMPLANT
NDL HYPO 25GX1X1/2 BEV (NEEDLE) IMPLANT
NEEDLE HYPO 25GX1X1/2 BEV (NEEDLE) IMPLANT
NS IRRIG 1000ML POUR BTL (IV SOLUTION) ×2 IMPLANT
PACK ORTHO EXTREMITY (CUSTOM PROCEDURE TRAY) ×2 IMPLANT
PAD ARMBOARD 7.5X6 YLW CONV (MISCELLANEOUS) ×4 IMPLANT
PAD CAST 4YDX4 CTTN HI CHSV (CAST SUPPLIES) IMPLANT
SPECIMEN JAR SMALL (MISCELLANEOUS) ×2 IMPLANT
SUCTION TUBE FRAZIER 10FR DISP (SUCTIONS) IMPLANT
SUT ETHILON 2 0 PSLX (SUTURE) IMPLANT
SUT PROLENE 3 0 PS 1 (SUTURE) IMPLANT
SUT VIC AB 2-0 FS1 27 (SUTURE) IMPLANT
SYR CONTROL 10ML LL (SYRINGE) IMPLANT
TOWEL GREEN STERILE (TOWEL DISPOSABLE) ×2 IMPLANT
TOWEL GREEN STERILE FF (TOWEL DISPOSABLE) ×2 IMPLANT
TUBE CONNECTING 12X1/4 (SUCTIONS) IMPLANT
WATER STERILE IRR 1000ML POUR (IV SOLUTION) ×2 IMPLANT

## 2023-03-17 NOTE — Anesthesia Preprocedure Evaluation (Addendum)
 Anesthesia Evaluation  Patient identified by MRN, date of birth, ID band Patient awake    Reviewed: Allergy & Precautions, NPO status , Patient's Chart, lab work & pertinent test results, reviewed documented beta blocker date and time   History of Anesthesia Complications (+) history of anesthetic complications  Airway Mallampati: II  TM Distance: >3 FB Neck ROM: Full    Dental  (+) Dental Advisory Given   Pulmonary sleep apnea and Continuous Positive Airway Pressure Ventilation , former smoker   Pulmonary exam normal breath sounds clear to auscultation       Cardiovascular hypertension, Pt. on medications and Pt. on home beta blockers +CHF  Normal cardiovascular exam Rhythm:Regular Rate:Normal  Echo 03/10/2021  1. Left ventricular ejection fraction, by estimation, is 60 to 65%. The  left ventricle has normal function. The left ventricle has no regional  wall motion abnormalities. There is moderate asymmetric left ventricular  hypertrophy of the septal segment.  Left ventricular diastolic parameters are consistent with Grade III  diastolic dysfunction (restrictive). Elevated left atrial pressure.   2. Right ventricular systolic function is normal. The right ventricular  size is normal. Tricuspid regurgitation signal is inadequate for assessing  PA pressure.   3. Left atrial size was severely dilated.   4. Right atrial size was mildly dilated.   5. The mitral valve is degenerative. Trivial mitral valve regurgitation.  Moderate mitral annular calcification.   6. The aortic valve is tricuspid. Aortic valve regurgitation is not  visualized. Aortic valve sclerosis is present, with no evidence of aortic  valve stenosis.   7. The inferior vena cava is normal in size with greater than 50%  respiratory variability, suggesting right atrial pressure of 3 mmHg.   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   Abdominal  (+) + obese  Peds  Hematology  (+) Blood dyscrasia, anemia   Anesthesia Other Findings   Reproductive/Obstetrics                              Anesthesia Physical Anesthesia Plan  ASA: 3  Anesthesia Plan: MAC   Post-op Pain Management: Minimal or no pain anticipated   Induction: Intravenous  PONV Risk Score and Plan: 2 and Ondansetron, Propofol infusion and Treatment may vary due to age or medical condition  Airway Management Planned: Natural Airway and Simple Face Mask  Additional Equipment: None  Intra-op Plan:   Post-operative Plan:   Informed Consent: I have reviewed the patients History and Physical, chart, labs and discussed the procedure including the risks, benefits and alternatives for the proposed anesthesia with the patient or authorized representative who has indicated his/her understanding and acceptance.     Dental advisory given  Plan Discussed with: Anesthesiologist and CRNA  Anesthesia Plan Comments:          Anesthesia Quick Evaluation

## 2023-03-17 NOTE — Op Note (Signed)
 Full Operative Report  Date of Operation: 8:39 PM, 03/17/2023   [name] - 63 y.o. male  Surgeon: Pilar Plate, DPM   Assistant: None  Diagnosis: Osteomyelitis of fifth metatarsal head right foot  Procedure:  1.  Fifth metatarsal head resection, right foot 2.  Application dermal allograft 3.5 x 1.5 cm, right foot    Anesthesia: Monitor Anesthesia Care  No responsible provider has been recorded for the case.  Anesthesiologist: Gaynelle Adu, MD; Mal Amabile, MD CRNA: Ayesha Rumpf, CRNA   Estimated Blood Loss: Minimal  Hemostasis: 1) Anatomical dissection, mechanical compression, electrocautery 2) no tourniquet was used during procedure  Implants: Implant Name Type Inv. Item Serial No. Manufacturer Lot No. LRB No. Used Action  GRAFT SKIN WND MICRO 38 - [id number] Tissue GRAFT SKIN WND MICRO 38  [employer] [id number] Right 1 Implanted    Materials: 3-0 Prolene  Injectables: 1) Pre-operatively: 10 cc of 50:50 mixture 1%lidocaine plain and 0.5% marcaine plain 2) Post-operatively: None   Specimens: Pathology: Fifth metatarsal head for pathology microbiology: Fifth metatarsal head for culture, deep wound swab culture, tissue culture ulceration fifth metatarsal head   Antibiotics: IV antibiotics given per schedule on the floor  Drains: None  Complications: Patient tolerated the procedure well without complication.   Operative findings: As below in detailed report  Indications for Procedure: Samuel Reed presents to Pilar Plate, DPM with a chief complaint of ulceration subfifth metatarsal head with concern for underlying osteomyelitis of the fifth metatarsal head on MRI.  The patient has failed conservative treatments of various modalities. At this time the patient has elected to proceed with surgical correction. All alternatives, risks, and complications of the procedures were thoroughly explained to the patient. Patient  exhibits appropriate understanding of all discussion points and informed consent was signed and obtained in the chart with no guarantees to surgical outcome given or implied.  Description of Procedure: Patient was brought to the operating room. Patient remained on their hospital bed in the supine position. A surgical timeout was performed and all members of the operating room, the procedure, and the surgical site were identified. anesthesia occurred as per anesthesia record. Local anesthetic as previously described was then injected about the operative field in a local infiltrative block.  The operative lower extremity as noted above was then prepped and draped in the usual sterile manner. The following procedure then began.  Attention was directed to the ulceration of the plantar aspect fifth metatarsal head.  The wound was debrided excisionally with 15 blade with excision of all necrotic and fibrotic tissues of the wound bed.  This was a complete full-thickness debridement to the level of the underlying fifth metatarsal head bone.  There was significant necrosis noted of the tissue underlying the ulceration.  A piece of the ulceration tissue was sent for culture.  A deep wound swab culture was also obtained.  Dissection was carried out around the fifth metatarsal head and plantar fifth MPJ so as to expose the fifth metatarsal head.  Next a sagittal saw was used to transect the fifth metatarsal at the neck.  The fifth metatarsal head was then resected in entirety.  A portion of the fifth metatarsal head was sent for culture and remainder was sent for pathology.  The proximal margin of the fifth metatarsal head at the neck was inked so as to determine proximal margin.  The surgical site was then irrigated with 3 L of sterile saline via power pulse lavage.  Next Kerecis micronized dermal allograft was then mixed with small amount of sterile saline and then packed into the wound bed.  The wound measured  approximately 3.5 x 1.5 cm postdebridement this was done to promote healing and fill  dead space.  The surgical site was then closed with complete closure of the incision including the prior ulceration under minimal tension with 3-0 Prolene.  The surgical site was then dressed with Xeroform 4 x 4 ABD pad Kerlix Ace wrap. The patient tolerated both the procedure and anesthesia well with vital signs stable throughout. The patient was transferred in good condition and all vital signs stable  from the OR to recovery under the discretion of anesthesia.  Condition: Vital signs stable, neurovascular status unchanged from preoperative   Surgical plan:  Healthy surgical wound at completion of procedure with clean margin expected.  Wound closed primarily.  Nonweightbearing to the right foot given plantar incision in postop shoe stable for discharge pending culture and pathology data to guide antibiotic therapy.  The patient will be nonweightbearing in a postop shoe to the operative limb until further instructed. The dressing is to remain clean, dry, and intact. Will continue to follow unless noted elsewhere.   Carlena Hurl, DPM [facility]

## 2023-03-17 NOTE — Progress Notes (Signed)
 History and Physical Interval Note:  03/17/2023 12:59 PM  Samuel Reed  has presented today for surgery, with the diagnosis of 5th met head osteomyelitis.  The various methods of treatment have been discussed with the patient and family. After consideration of risks, benefits and other options for treatment, the patient has consented to   Procedure(s) with comments: METATARSAL HEAD EXCISION (Right) - Right 5th met head resection possible antibiotic beads and possible graft application as a surgical intervention.  The patient's history has been reviewed, patient examined, no change in status, stable for surgery.  I have reviewed the patient's chart and labs.  Questions were answered to the patient's satisfaction.     Jenelle Mages Emoni Yang

## 2023-03-17 NOTE — Hospital Course (Signed)
 63 y.o. male with medical history significant of ESRD on PD, chronic HFpEF (EF 60-65% and G3DD 02/2021), hypertension, hyperlipidemia, insulin-dependent type 2 diabetes, OSA on CPAP, anemia sent to the ED by podiatry due to concern for infected ulcer on the plantar surface of the right foot.  Patient was started on doxycycline 100 mg twice daily over the weekend.  Seen at podiatry office yesterday and underwent I&D.  Wound cultures were sent to pathology.  Due to malodorous drainage, patient was sent to the ED for hospital admission and MRI.  Podiatry will follow inpatient.  Vital signs stable, afebrile.  Labs showing WBC count 11.6, hemoglobin 11.9 (stable), potassium 4.0, bicarb 19, glucose 97.  X-ray without signs of osteomyelitis. Patient was given fentanyl, Norco, vancomycin, and cefepime.  TRH called to admit.   Patient states he noticed an ulcer at the bottom of his right foot 4 days ago and was prescribed doxycycline by his podiatrist.  [name] he went to see his podiatrist who sent him to the ED for hospital admission due to concern for foot infection.  He denies fevers or chills.  Not having any pain in his foot at this time.  No other complaints.  Denies cough, shortness breath, chest pain, vomiting, abdominal pain, or diarrhea.

## 2023-03-17 NOTE — Transfer of Care (Signed)
 Immediate Anesthesia Transfer of Care Note  [name]  Procedure(s) Performed: METATARSAL HEAD EXCISION (Right: Toe)  Patient Location: PACU  Anesthesia Type:MAC  Level of Consciousness: drowsy, patient cooperative, and responds to stimulation  Airway & Oxygen Therapy: Patient Spontanous Breathing and Patient connected to nasal cannula oxygen  Post-op Assessment: Report given to RN and Post -op Vital signs reviewed and stable  Post vital signs: Reviewed and stable  Last Vitals:  Vitals Value Taken Time  BP    Temp    Pulse    Resp    SpO2      Last Pain:  Vitals:   03/17/23 1326  TempSrc:   PainSc: 2       Patients Stated Pain Goal: 3 (03/17/23 1326)  Complications: No notable events documented.

## 2023-03-17 NOTE — Progress Notes (Signed)
 Orthopedic Tech Progress Note Patient Details:  Samuel Reed 1960/10/25 [id number]  Ortho Devices Type of Ortho Device: Postop shoe/boot Ortho Device/Splint Location: RLE Ortho Device/Splint Interventions: Application   Post Interventions Patient Tolerated: Well  Samuel Reed [name] 03/17/2023, 3:49 PM

## 2023-03-17 NOTE — Anesthesia Postprocedure Evaluation (Signed)
 Anesthesia Post Note  Patient: Molinda Bailiff  Procedure(s) Performed: METATARSAL HEAD EXCISION (Right: Toe)     Patient location during evaluation: PACU Anesthesia Type: MAC Level of consciousness: awake and alert Pain management: pain level controlled Vital Signs Assessment: post-procedure vital signs reviewed and stable Respiratory status: spontaneous breathing, nonlabored ventilation and respiratory function stable Cardiovascular status: stable and blood pressure returned to baseline Postop Assessment: no apparent nausea or vomiting Anesthetic complications: no  No notable events documented.  Last Vitals:  Vitals:   03/17/23 1545 03/17/23 1600  BP: 130/67 135/67  Pulse: 73 70  Resp: 17 14  Temp:    SpO2: 100% 100%    Last Pain:  Vitals:   03/17/23 1600  TempSrc:   PainSc: 0-No pain                 Deeksha Cotrell,W. EDMOND

## 2023-03-17 NOTE — Progress Notes (Signed)
 [facility] Progress Note   Subjective: Standing weights obtained this AM for CCPD. Much different than bed weight 109 kg. He says he is intentionally trying to lose wt. K+ OK.   Going for either bone biopsy and debridement of ulceration versus fifth R metatarsal head resection later today.     Objective Vitals:   03/17/23 0500 03/17/23 0640 03/17/23 0745 03/17/23 0808  BP:   134/69   Pulse:   78   Resp:   18   Temp:   98.5 F (36.9 C)   TempSrc:   Oral   SpO2:   100%   Weight: 112.2 kg 112.2 kg  109 kg  Height:       Physical Exam General: Pleasant male in NAD Heart: S1,S2 RRR No M/R/G Lungs: CTAB A/P Abdomen: NABS, NT Extremities: No LE edema Dialysis Access: PD cath abdomen drsg intact    Additional Objective Labs: Basic Metabolic Panel: Recent Labs  Lab 03/15/23 1618 03/16/23 0410  NA 136 138  K 4.0 4.6  CL 106 109  CO2 19* 17*  GLUCOSE 97 111*  BUN 74* 78*  CREATININE 7.38* 7.76*  CALCIUM 8.7* 8.8*  PHOS  --  7.1*   Liver Function Tests: Recent Labs  Lab 03/16/23 0410  AST 11*  ALT 19  ALKPHOS 62  BILITOT 0.8  PROT 6.6  ALBUMIN 3.0*   No results for input(s): "LIPASE", "AMYLASE" in the last 168 hours. CBC: Recent Labs  Lab 03/15/23 1618 03/16/23 0410  WBC 11.6* 11.0*  HGB 11.9* 11.4*  HCT 35.4* 34.8*  MCV 89.8 91.1  PLT 290 266   Blood Culture    Component Value Date/Time   SDES PERICARDIAL 01/17/2019 1139   SDES PERICARDIAL 01/17/2019 1139   SPECREQUEST NONE 01/17/2019 1139   SPECREQUEST NONE 01/17/2019 1139   CULT  01/17/2019 1139    NO GROWTH 5 DAYS Performed at [facility], [address], [place], [place] [address]    REPTSTATUS 01/17/2019 FINAL 01/17/2019 1139   REPTSTATUS 01/22/2019 FINAL 01/17/2019 1139    Cardiac Enzymes: No results for input(s): "CKTOTAL", "CKMB", "CKMBINDEX", "TROPONINI" in the last 168 hours. CBG: Recent Labs  Lab 03/16/23 1301 03/16/23 1633 03/16/23 2005  03/17/23 0000 03/17/23 0414  GLUCAP 112* 93 165* 149* 155*   Iron Studies: No results for input(s): "IRON", "TIBC", "TRANSFERRIN", "FERRITIN" in the last 72 hours. @lablastinr3 @ Studies/Results: MR FOOT RIGHT W WO CONTRAST Result Date: 03/16/2023 CLINICAL DATA:  Right foot wound.  Callus removal a month ago. EXAM: MRI OF THE RIGHT FOREFOOT WITHOUT AND WITH CONTRAST TECHNIQUE: Multiplanar, multisequence MR imaging of the right forefoot was performed before and after the administration of intravenous contrast. CONTRAST:  10mL GADAVIST GADOBUTROL 1 MMOL/ML IV SOLN COMPARISON:  Right foot x-rays from yesterday. FINDINGS: Bones/Joint/Cartilage Mild marrow edema with corresponding decreased T1 marrow signal involving the fifth metatarsal head. No additional worrisome marrow signal. No fracture or dislocation. Midfoot degenerative changes. No joint effusion. Ligaments Collateral ligaments are intact.  Lisfranc ligament is intact. Muscles and Tendons Flexor and extensor tendons are intact. No tenosynovitis. Increased T2 signal within and atrophy of the intrinsic muscles of the forefoot, nonspecific, but likely related to diabetic muscle changes. Soft tissue Soft tissue ulceration at the plantar aspect of the fifth metatarsal head extending near bone. No fluid collection. 7 x 10 x 9 mm ganglion cyst dorsal to the second TMT joint. IMPRESSION: 1. Soft tissue ulceration at the plantar aspect of the fifth metatarsal  head extending near bone. 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 No abscess. Electronically Signed   By: Obie Dredge M.D.   On: 03/16/2023 11:53   DG Foot Complete Right Result Date: 03/16/2023 Please see detailed radiograph report in office note.  DG Foot Complete Right Result Date: 03/15/2023 CLINICAL DATA:  Foot wound EXAM: RIGHT FOOT COMPLETE - 3+ VIEW COMPARISON:  03/15/2023, 01/19/2023 FINDINGS: No fracture or malalignment. Similar osseous bridging across the third  through fifth proximal metatarsals. Similar tarsal degenerative changes. Moderate plantar calcaneal spur. Wound plantar aspect of the distal foot, no acute osseous destructive change. No soft tissue emphysema IMPRESSION: Wound plantar aspect of the distal foot. No acute osseous destructive change. Electronically Signed   By: Jasmine Pang M.D.   On: 03/15/2023 22:03   Medications:  ceFEPime (MAXIPIME) IV 1 g (03/16/23 2128)   dialysis solution 1.5% low-MG/low-CA     dialysis solution 2.5% low-MG/low-CA     dialysis solution 2.5% low-MG/low-CA     metronidazole 500 mg (03/17/23 0357)    gentamicin cream  1 Application Topical Daily   gentamicin cream  1 Application Topical Daily   insulin aspart  0-6 Units Subcutaneous Q4H   sevelamer carbonate  800 mg Oral TID WC   torsemide  100 mg Oral Daily   vancomycin variable dose per unstable renal function (pharmacist dosing)   Does not apply See admin instructions     OP CCPD: 7 nights/ wk, Dr Marisue Humble  4 exchanges, 3000 cc dwells, 1.5hr dwell time - usually hangs mix of 1.5% and 2.5% fluids       Assessment/ Plan: Infected R foot ulcer - in a diabetic. Getting IV abx, wound cx's sent. Per pmd ESRD - on CCPD. Plan PD nightly while here.  HTN - bp's stable 135/ 70. Follow.  Volume - euvolemic, no vol  excess. Appears euvolemic today. Will use 1/2 1.5% and 1/2 2.5% per home regimen tonight. Needs standing wts-ordered. Cont torsemide daily.  Anemia of esrd - Hb good 11-12 here, no esa needs.  Secondary hyperparathyroidism - CCa in range, add on phos. Cont binders w/ meals.  Karlye Ihrig H. Sammye Staff NP-C 03/17/2023, 8:51 AM  [employer] [phone]

## 2023-03-17 NOTE — Progress Notes (Signed)
   03/17/23 0628  Completion  Treatment Status Complete  Initial Drain Volume 25  Average Dwell Time-Hour(s) 1  Average Dwell Time-Min(s) 30  Average Drain Time 25  Total Therapy Volume 16109  Total Therapy Time-Hour(s) 9  Total Therapy Time-Min(s) 6  Weight after Drain 247 lb 5.7 oz (112.2 kg)  Effluent Appearance Amber  Cell Count on Daytime Exchange N/A  Fluid Balance - CCPD  Total UF (+ value on cycler, pt loss) 1256 mL  Procedure Comments  Tolerated treatment well? Yes  Peritoneal Dialysis Comments  (UF )  Education / Care Plan  Dialysis Education Provided Yes  Hand-off documentation  Hand-off Given Given to shift RN/LPN  Report given to (Full Name) Tameshia  Hand-off Received Received from shift RN/LPN   Pt Stable Post treatment.

## 2023-03-17 NOTE — Progress Notes (Signed)
 Progress Note   [name] UEA:[id number] DOB: December 21, 1960 DOA: 03/15/2023     1 DOS: the patient was seen and examined on 03/17/2023   Brief hospital course: 63 y.o. male with medical history significant of ESRD on PD, chronic HFpEF (EF 60-65% and G3DD 02/2021), hypertension, hyperlipidemia, insulin-dependent type 2 diabetes, OSA on CPAP, anemia sent to the ED by podiatry due to concern for infected ulcer on the plantar surface of the right foot.  Patient was started on doxycycline 100 mg twice daily over the weekend.  Seen at podiatry office yesterday and underwent I&D.  Wound cultures were sent to pathology.  Due to malodorous drainage, patient was sent to the ED for hospital admission and MRI.  Podiatry will follow inpatient.  Vital signs stable, afebrile.  Labs showing WBC count 11.6, hemoglobin 11.9 (stable), potassium 4.0, bicarb 19, glucose 97.  X-ray without signs of osteomyelitis. Patient was given fentanyl, Norco, vancomycin, and cefepime.  TRH called to admit.   Patient states he noticed an ulcer at the bottom of his right foot 4 days ago and was prescribed doxycycline by his podiatrist.  [name] he went to see his podiatrist who sent him to the ED for hospital admission due to concern for foot infection.  He denies fevers or chills.  Not having any pain in his foot at this time.  No other complaints.  Denies cough, shortness breath, chest pain, vomiting, abdominal pain, or diarrhea.  Assessment and Plan: Infected diabetic right foot ulcer Labs showing borderline leukocytosis.  No fever, tachycardia, or signs of sepsis.  Dorsalis pedis pulse intact and foot warm to touch.  X-ray without signs of osteomyelitis.  MRI has been ordered for further evaluation.  Continue antibiotic coverage with vancomycin, cefepime, and metronidazole.  Trend WBC count and follow-up wound cultures.  Keep n.p.o. after midnight.  Podiatry will consult.   ESRD on PD No signs of volume overload.   Potassium within normal range.  Bicarb mildly low and replacement ordered.  Consult nephrology in the morning.   Insulin-dependent type 2 diabetes Last A1c 6.7 in August 2024, repeat ordered.  Placed on very sensitive sliding scale insulin every 4 hours as patient is n.p.o. after midnight.   OSA Continue nightly CPAP.   Chronic anemia Hemoglobin stable, continue to monitor labs.   Hypertension: Blood pressure stable. Hyperlipidemia Pharmacy med rec pending.      Subjective: Without complaints this AM  Physical Exam: Vitals:   03/17/23 0745 03/17/23 0808 03/17/23 1326 03/17/23 1530  BP: 134/69   119/60  Pulse: 78   69  Resp: 18   14  Temp: 98.5 F (36.9 C)   (!) 97 F (36.1 C)  TempSrc: Oral     SpO2: 100%   100%  Weight:  109 kg 109 kg   Height:   6' (1.829 m)    General exam: Awake, laying in bed, in nad Respiratory system: Normal respiratory effort, no wheezing Cardiovascular system: regular rate, s1, s2 Gastrointestinal system: Soft, nondistended, positive BS Central nervous system: CN2-12 grossly intact, strength intact Extremities: Perfused, no clubbing Skin: Normal skin turgor, no notable skin lesions seen Psychiatry: Mood normal // no visual hallucinations   Data Reviewed:  Labs reviewed: Na 139, K 4.4, Cr 7.00  Family Communication: Pt in room, family not at bedside  Disposition: Status is: Inpatient Remains inpatient appropriate because: severity of illness  Planned Discharge Destination: Home    Author: Rickey Barbara, MD 03/17/2023 3:52 PM  For  on call review [email or web address].

## 2023-03-18 ENCOUNTER — Other Ambulatory Visit (HOSPITAL_COMMUNITY): Payer: Self-pay

## 2023-03-18 ENCOUNTER — Encounter (HOSPITAL_COMMUNITY): Payer: Self-pay | Admitting: Podiatry

## 2023-03-18 ENCOUNTER — Other Ambulatory Visit: Payer: Self-pay | Admitting: Podiatry

## 2023-03-18 DIAGNOSIS — E11628 Type 2 diabetes mellitus with other skin complications: Secondary | ICD-10-CM | POA: Diagnosis not present

## 2023-03-18 DIAGNOSIS — L089 Local infection of the skin and subcutaneous tissue, unspecified: Secondary | ICD-10-CM | POA: Diagnosis not present

## 2023-03-18 DIAGNOSIS — L97513 Non-pressure chronic ulcer of other part of right foot with necrosis of muscle: Secondary | ICD-10-CM | POA: Diagnosis not present

## 2023-03-18 LAB — WOUND CULTURE
MICRO NUMBER:: 16096550
SPECIMEN QUALITY:: ADEQUATE

## 2023-03-18 LAB — CBC
HCT: 37 % — ABNORMAL LOW (ref 39.0–52.0)
Hemoglobin: 12.3 g/dL — ABNORMAL LOW (ref 13.0–17.0)
MCH: 29.6 pg (ref 26.0–34.0)
MCHC: 33.2 g/dL (ref 30.0–36.0)
MCV: 89.2 fL (ref 80.0–100.0)
Platelets: 308 10*3/uL (ref 150–400)
RBC: 4.15 MIL/uL — ABNORMAL LOW (ref 4.22–5.81)
RDW: 13.9 % (ref 11.5–15.5)
WBC: 8.1 10*3/uL (ref 4.0–10.5)
nRBC: 0 % (ref 0.0–0.2)

## 2023-03-18 LAB — GLUCOSE, CAPILLARY
Glucose-Capillary: 171 mg/dL — ABNORMAL HIGH (ref 70–99)
Glucose-Capillary: 190 mg/dL — ABNORMAL HIGH (ref 70–99)
Glucose-Capillary: 206 mg/dL — ABNORMAL HIGH (ref 70–99)

## 2023-03-18 LAB — RENAL FUNCTION PANEL
Albumin: 3.2 g/dL — ABNORMAL LOW (ref 3.5–5.0)
Anion gap: 16 — ABNORMAL HIGH (ref 5–15)
BUN: 74 mg/dL — ABNORMAL HIGH (ref 8–23)
CO2: 20 mmol/L — ABNORMAL LOW (ref 22–32)
Calcium: 9.2 mg/dL (ref 8.9–10.3)
Chloride: 102 mmol/L (ref 98–111)
Creatinine, Ser: 7.05 mg/dL — ABNORMAL HIGH (ref 0.61–1.24)
GFR, Estimated: 8 mL/min — ABNORMAL LOW (ref >60)
Glucose, Bld: 179 mg/dL — ABNORMAL HIGH (ref 70–99)
Phosphorus: 6.8 mg/dL — ABNORMAL HIGH (ref 2.5–4.6)
Potassium: 4.2 mmol/L (ref 3.5–5.1)
Sodium: 138 mmol/L (ref 135–145)

## 2023-03-18 LAB — DUPLICATE REPORT

## 2023-03-18 LAB — HOUSE ACCOUNT TRACKING

## 2023-03-18 LAB — VANCOMYCIN, RANDOM: Vancomycin Rm: 17 ug/mL

## 2023-03-18 MED ORDER — AMOXICILLIN-POT CLAVULANATE 500-125 MG PO TABS
1.0000 | ORAL_TABLET | Freq: Two times a day (BID) | ORAL | 0 refills | Status: AC
  Filled 2023-03-18 (×2): qty 28, 14d supply, fill #0

## 2023-03-18 MED ORDER — VANCOMYCIN HCL 1500 MG/300ML IV SOLN
1500.0000 mg | Freq: Once | INTRAVENOUS | Status: AC
  Administered 2023-03-18: 1500 mg via INTRAVENOUS
  Filled 2023-03-18: qty 300

## 2023-03-18 MED ORDER — DELFLEX-LC/2.5% DEXTROSE 394 MOSM/L IP SOLN: INTRAPERITONEAL | Status: DC

## 2023-03-18 MED ORDER — DOXYCYCLINE HYCLATE 100 MG PO TABS
100.0000 mg | ORAL_TABLET | Freq: Two times a day (BID) | ORAL | 0 refills | Status: AC
  Filled 2023-03-18: qty 28, 14d supply, fill #0

## 2023-03-18 MED ORDER — GENTAMICIN SULFATE 0.1 % EX CREA: 1.0000 | TOPICAL_CREAM | Freq: Every day | CUTANEOUS | Status: DC

## 2023-03-18 MED ORDER — HYDROCODONE-ACETAMINOPHEN 5-325 MG PO TABS
1.0000 | ORAL_TABLET | ORAL | 0 refills | Status: DC | PRN
  Filled 2023-03-18: qty 15, 3d supply, fill #0

## 2023-03-18 NOTE — Progress Notes (Addendum)
 Pharmacy Antibiotic Note  Samuel Reed is a 62 y.o. male admitted on 03/15/2023 with diabetic foot infection. PMH significant for ESRD on PD. Pt noticed an ulcer at the bottom of his right foot 4 days ago and was prescribed doxycycline by his podiatrist on 2/16. Patient is s/p resection on 2/19 of fifth metatarsal.  Pharmacy has been consulted for cefepime and vancomycin dosing.  Vancomycin random this AM is 17. Has been on PD - charting inaccurate per conversation with nursing.   Plan: Cefepime 1g q24h Will re-dose vancomycin this AM at 1500mg , pt received 2000g on 2/17. Will likely need another level in 3 days if remains on therapy.  F/u cx data finalization, cultures from 2/17 were not acceptable specimens.   Height: 6' (182.9 cm) Weight: 109 kg (240 lb 4.8 oz) IBW/kg (Calculated) : 77.6  Temp (24hrs), Avg:97.6 F (36.4 C), Min:97 F (36.1 C), Max:98.1 F (36.7 C)  Recent Labs  Lab 03/15/23 1618 03/16/23 0410 03/17/23 0806 03/18/23 0446  WBC 11.6* 11.0*  --  8.1  CREATININE 7.38* 7.76* 7.00* 7.05*  VANCORANDOM  --   --   --  17    Estimated Creatinine Clearance: 14 mL/min (A) (by C-G formula based on SCr of 7.05 mg/dL (H)).    No Known Allergies  Antimicrobials this admission: Cefepime 2/17> Vancomycin 2/17>  Thank you for allowing pharmacy to be a part of this patient's care.  Estill Batten, PharmD, BCCCP  03/18/2023 9:57 AM

## 2023-03-18 NOTE — Evaluation (Signed)
 Physical Therapy Brief Evaluation and Discharge Note Patient Details Name: Samuel Reed MRN: [id number] DOB: [birth year] Today's Date: 03/18/2023   History of Present Illness  Pt is a 63 y.o. M who presents for infected ulcer on plantar surface of right foot now s/p right metatarsal head resection 2/19. Significant PMH: ESRD on PD, chronic HFpEF, HTN, HLD, insulin dependent type 2 diabetes.  Clinical Impression  Pt admitted s/p procedure listed above. Pt verbalizing good pain control and is mobilizing well; adhering to non weightbearing precautions. Pt has been utilizing RW for ambulating to and from bathroom, but requesting trialing knee scooter for longer distance navigation for energy conservation. Pt propelled knee scooter x 100 ft with cueing for locking/unlocking brakes, obstacle and turn negotiation. Pt plans to purchase device online. Would recommend RW for traversing shorter household distances. Educated pt regarding precautions, DME, post op shoe use, fall precautions. No further acute or follow up PT needs. Thank you for this consult.       PT Assessment Patient does not need any further PT services  Assistance Needed at Discharge  Other (comment) (assist for PD)    Equipment Recommendations Rolling walker (2 wheels)  Recommendations for Other Services       Precautions/Restrictions Precautions Precautions: Fall Required Braces or Orthoses: Other Brace Other Brace: R post op shoe Restrictions Weight Bearing Restrictions Per Provider Order: Yes RLE Weight Bearing Per Provider Order: Non weight bearing        Mobility  Bed Mobility          Transfers Overall transfer level: Modified independent Equipment used: None                    Ambulation/Gait Ambulation/Gait assistance: Contact guard assist Gait Distance (Feet): 100 Feet Assistive device: Knee scooter     General Gait Details: Propelled knee scooter with verbal cues for obstacle negotiation  and turns  Home Activity Instructions    Stairs            Modified Rankin (Stroke Patients Only)        Balance                          Pertinent Vitals/Pain   Pain Assessment Pain Assessment: No/denies pain     Home Living   Living Arrangements: Spouse/significant other       Home Equipment: None        Prior Function        UE/LE Assessment               Communication   Communication Communication: No apparent difficulties     Cognition         General Comments      Exercises     Assessment/Plan    PT Problem List         PT Visit Diagnosis Difficulty in walking, not elsewhere classified (R26.2)    No Skilled PT All education completed;Patient will have necessary level of assist by caregiver at discharge   Co-evaluation                AMPAC 6 Clicks Help needed turning from your back to your side while in a flat bed without using bedrails?: None Help needed moving from lying on your back to sitting on the side of a flat bed without using bedrails?: None Help needed moving to and from a bed to a chair (including a wheelchair)?: A  Little Help needed standing up from a chair using your arms (e.g., wheelchair or bedside chair)?: A Little Help needed to walk in hospital room?: A Little Help needed climbing 3-5 steps with a railing? : A Lot 6 Click Score: 19      End of Session   Activity Tolerance: Patient tolerated treatment well Patient left: in bed;with call bell/phone within reach Nurse Communication: Mobility status PT Visit Diagnosis: Difficulty in walking, not elsewhere classified (R26.2)     Time: 8119-1478 PT Time Calculation (min) (ACUTE ONLY): 22 min  Charges:   PT Evaluation $PT Eval Low Complexity: 1 Low      Lillia Pauls, PT, DPT Acute Rehabilitation Services Office [phone]   Norval Morton  03/18/2023, 2:01 PM

## 2023-03-18 NOTE — Progress Notes (Signed)
   03/18/23 1610  Peritoneal Catheter Mid lower abdomen Continuous ambulatory  Placement Date/Time: 04/02/21 1339   Procedural Verification: Medical records & consent reviewed;Relevant studies,results and images reviewed  Time out: Correct Patient;Correct Site;Correct Procedure;Special equipment/requirements available  Person In...  Site Assessment Clean, Dry, Intact  Drainage Description None  Catheter status Deaccessed  Dressing Gauze/Drain sponge  Dressing Status Clean, Dry, Intact  Completion  Treatment Status Complete  Initial Drain Volume 242  Average Dwell Time-Hour(s) 1  Average Dwell Time-Min(s) 30  Average Drain Time 26  Total Therapy Volume 96045  Total Therapy Time-Hour(s) 8  Total Therapy Time-Min(s) 59  Weight after Drain 239 lb 11.2 oz (108.7 kg)  Effluent Appearance Amber  Cell Count on Daytime Exchange N/A  Fluid Balance - CCPD  Total UF (+ value on cycler, pt loss) 715 mL  Procedure Comments  Tolerated treatment well? Yes  Education / Care Plan  Dialysis Education Provided Yes  Hand-off documentation  Hand-off Given Given to shift RN/LPN  Report given to (Full Name) Loreano,Kiedy  Hand-off Received Received from shift RN/LPN  Report received from (Full Name) Larene Pickett Jasn Xia   Pt tolerated treatment without complications.

## 2023-03-18 NOTE — TOC Transition Note (Signed)
 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 RN, patient, and patient's family notified of DC.   Post hospital f/u noted on AVS. Pt without RX med concerns. Pt to pick up RX meds from [facility] pharmacy. Family to provide transportation home . Referral made with [employer] for RW. Equipment already delivered to bedside.   RNCM will sign off for now as intervention is no longer needed. Please consult [place] again if new needs arise.   Final next level of care: Home/Self Care Barriers to Discharge: No Barriers Identified   Patient Goals and CMS Choice            Discharge Placement                       Discharge Plan and Services Additional resources added to the After Visit Summary for                  DME Arranged: Walker rolling DME Agency: [employer] Date DME Agency Contacted: 03/18/23 Time DME Agency Contacted: 1517 Representative spoke with at DME Agency: Ian Malkin            Social Drivers of Health (SDOH) Interventions SDOH Screenings   Food Insecurity: No Food Insecurity (03/16/2023)  Housing: Low Risk  (03/16/2023)  Transportation Needs: No Transportation Needs (03/16/2023)  Utilities: Not At Risk (03/16/2023)  Depression (PHQ2-9): Low Risk  (12/22/2022)  Tobacco Use: Medium Risk (03/17/2023)     Readmission Risk Interventions     No data to display

## 2023-03-18 NOTE — Progress Notes (Signed)
 [facility] Progress Note   Subjective: Went for R metatarsal head excision 03/18/2023.     Objective Vitals:   03/17/23 1545 03/17/23 1600 03/17/23 2000 03/18/23 0400  BP: 130/67 135/67 (!) 154/71 (!) 142/69  Pulse: 73 70 78 94  Resp: 17 14 16 16   Temp:   97.7 F (36.5 C) 98.1 F (36.7 C)  TempSrc:   Oral Oral  SpO2: 100% 100% 100% 100%  Weight:      Height:       Physical Exam General: Pleasant male in NAD Heart: S1,S2 RRR No M/R/G Lungs: CTAB A/P Abdomen: NABS, NT Extremities: No LE edema. ACE wrap R foot.  Dialysis Access: PD cath abdomen drsg intact  Additional Objective Labs: Basic Metabolic Panel: Recent Labs  Lab 03/16/23 0410 03/17/23 0806 03/18/23 0446  NA 138 139 138  K 4.6 4.4 4.2  CL 109 104 102  CO2 17* 18* 20*  GLUCOSE 111* 141* 179*  BUN 78* 74* 74*  CREATININE 7.76* 7.00* 7.05*  CALCIUM 8.8* 9.3 9.2  PHOS 7.1*  --  6.8*   Liver Function Tests: Recent Labs  Lab 03/16/23 0410 03/18/23 0446  AST 11*  --   ALT 19  --   ALKPHOS 62  --   BILITOT 0.8  --   PROT 6.6  --   ALBUMIN 3.0* 3.2*   No results for input(s): "LIPASE", "AMYLASE" in the last 168 hours. CBC: Recent Labs  Lab 03/15/23 1618 03/16/23 0410 03/18/23 0446  WBC 11.6* 11.0* 8.1  HGB 11.9* 11.4* 12.3*  HCT 35.4* 34.8* 37.0*  MCV 89.8 91.1 89.2  PLT 290 266 308   Blood Culture    Component Value Date/Time   SDES BONE 03/17/2023 1518   SPECREQUEST 5TH METATARSAL HEAD 03/17/2023 1518   CULT  03/17/2023 1518    NO GROWTH < 12 HOURS Performed at [facility], [address] [place], [place] [address]    REPTSTATUS PENDING 03/17/2023 1518    Cardiac Enzymes: No results for input(s): "CKTOTAL", "CKMB", "CKMBINDEX", "TROPONINI" in the last 168 hours. CBG: Recent Labs  Lab 03/17/23 1543 03/17/23 1648 03/17/23 2011 03/18/23 0016 03/18/23 0439  GLUCAP 119* 117* 175* 206* 171*   Iron Studies: No results for input(s): "IRON", "TIBC",  "TRANSFERRIN", "FERRITIN" in the last 72 hours. @lablastinr3 @ Studies/Results: DG Foot 2 Views Right Result Date: 03/17/2023 CLINICAL DATA:  Postoperative state. EXAM: RIGHT FOOT - 2 VIEW COMPARISON:  March 15, 2023. FINDINGS: Status post surgical resection of distal portion of fifth metatarsal. Moderate posterior calcaneal spurring is noted. No other significant bony abnormality is noted. IMPRESSION: Status post surgical resection of distal portion of fifth metatarsal. Electronically Signed   By: Lupita Raider M.D.   On: 03/17/2023 16:40   Medications:  ceFEPime (MAXIPIME) IV 1 g (03/17/23 2227)   dialysis solution 1.5% low-MG/low-CA     dialysis solution 2.5% low-MG/low-CA     metronidazole 500 mg (03/18/23 0452)   vancomycin 1,500 mg (03/18/23 1057)    gentamicin cream  1 Application Topical Daily   insulin aspart  0-6 Units Subcutaneous Q4H   sevelamer carbonate  800 mg Oral TID WC   torsemide  100 mg Oral Daily   vancomycin variable dose per unstable renal function (pharmacist dosing)   Does not apply See admin instructions     OP CCPD: 7 nights/ wk, Dr Marisue Humble  4 exchanges, 3000 cc dwells, 1.5hr dwell time - usually hangs mix of 1.5% and 2.5%  fluids     Assessment/ Plan: Infected R foot ulcer -S/P R metatarsal head excision 03/18/2023. ABX per primary.  ESRD - on CCPD. Plan PD nightly while here.  HTN - bp's stable 135/ 70. Follow.  Volume - euvolemic, no vol  excess. Appears euvolemic today. Will use 1/2 1.5% and 1/2 2.5% per home regimen. Needs standing wts-ordered. Cont torsemide daily.  Anemia of esrd - Hb good 11-12 here, no esa needs.  Secondary hyperparathyroidism - CCa in range, add on phos. Cont binders w/ meals.   Daisee Centner H. Tayven Renteria NP-C 03/18/2023, 11:52 AM  [employer] [phone]

## 2023-03-18 NOTE — Discharge Summary (Signed)
 Physician Discharge Summary   [name] MRN: [id number] DOB: 1960-09-24  Admit date:     03/15/2023  Discharge date: 03/18/23  Discharge Physician: Rickey Barbara   PCP: Karie Schwalbe, MD   Recommendations at discharge:   Follow up with PCP in 1-2 weeks Follow up with Podiatry as scheduled   Discharge Diagnoses: Principal Problem:   Diabetic foot infection (HCC) Active Problems:   Hyperlipemia   Essential hypertension   OSA on CPAP   Type 2 diabetes mellitus with chronic kidney disease on chronic dialysis, with long-term current use of insulin (HCC)   Anemia   Pyogenic inflammation of bone (HCC)  Resolved Problems:   * No resolved hospital problems. *  Hospital Course: 63 y.o. male with medical history significant of ESRD on PD, chronic HFpEF (EF 60-65% and G3DD 02/2021), hypertension, hyperlipidemia, insulin-dependent type 2 diabetes, OSA on CPAP, anemia sent to the ED by podiatry due to concern for infected ulcer on the plantar surface of the right foot.  Patient was started on doxycycline 100 mg twice daily over the weekend.  Seen at podiatry office yesterday and underwent I&D.  Wound cultures were sent to pathology.  Due to malodorous drainage, patient was sent to the ED for hospital admission and MRI.  Podiatry will follow inpatient.  Vital signs stable, afebrile.  Labs showing WBC count 11.6, hemoglobin 11.9 (stable), potassium 4.0, bicarb 19, glucose 97.  X-ray without signs of osteomyelitis. Patient was given fentanyl, Norco, vancomycin, and cefepime.  [facility] called to admit.   Patient states he noticed an ulcer at the bottom of his right foot 4 days ago and was prescribed doxycycline by his podiatrist.  [name] he went to see his podiatrist who sent him to the ED for hospital admission due to concern for foot infection.  He denies fevers or chills.  Not having any pain in his foot at this time.  No other complaints.  Denies cough, shortness breath, chest pain,  vomiting, abdominal pain, or diarrhea.  Assessment and Plan: Infected diabetic right foot ulcer Labs showing borderline leukocytosis.  No fever, tachycardia, or signs of sepsis.  Dorsalis pedis pulse intact and foot warm to touch.  X-ray without signs of osteomyelitis.   -Podiatry was consulted and pt underwent 5th metatarsal resection on R foot 2/19 -Podiatry recommended 2 weeks of augmentin and doxycycline on d/c with follow up in office next week   ESRD on PD No signs of volume overload.  Potassium within normal range.  Bicarb mildly low and replacement ordered.   -Nephrology was consulted -Underwent PD while in hospital   Insulin-dependent type 2 diabetes -continued SSI as needed this visit   OSA Continue nightly CPAP.   Chronic anemia Hgb remained stable   Hypertension: Blood pressure remained stable. Hyperlipidemia       Consultants: Podiatry, Nephrology Procedures performed: fifth metatarsal head resection, R foot  Disposition: Home Diet recommendation:  Renal diet DISCHARGE MEDICATION: Allergies as of 03/18/2023   No Known Allergies      Medication List     TAKE these medications    amLODipine 10 MG tablet Commonly known as: NORVASC TAKE 1 TABLET BY MOUTH EVERY DAY   amoxicillin-clavulanate 875-125 MG tablet Commonly known as: AUGMENTIN Take 1 tablet by mouth 2 (two) times daily for 14 days.   BD Pen Needle Nano 2nd Gen 32G X 4 MM Misc Generic drug: Insulin Pen Needle USE TO INJECT INSULIN 4 TIMES A DAY   cholecalciferol 25  MCG (1000 UNIT) tablet Commonly known as: VITAMIN D3 Take 2,000 Units by mouth daily with breakfast.   doxycycline 100 MG tablet Commonly known as: VIBRA-TABS Take 1 tablet (100 mg total) by mouth 2 (two) times daily for 14 days.   fluticasone 50 MCG/ACT nasal spray Commonly known as: FLONASE PLACE 1 SPRAY INTO BOTH NOSTRILS 2 (TWO) TIMES DAILY. What changed: how much to take   FreeStyle Libre 3 Sensor Misc USE 1 SENSOR  EVERY 14 DAYS   FreeStyle Libre 3 Plus Sensor Misc Inject 1 Device into the skin continuous. Change every 15 days   gentamicin cream 0.1 % Commonly known as: GARAMYCIN Apply 1 application topically 2 (two) times daily. What changed: when to take this   HYDROcodone-acetaminophen 5-325 MG tablet Commonly known as: NORCO/VICODIN Take 1 tablet by mouth every 4 (four) hours as needed for severe pain (pain score 7-10).   insulin lispro 100 UNIT/ML KwikPen Commonly known as: HumaLOG KwikPen INJECT 10-20 UNITS TOTAL INTO THE SKIN 3 (THREE) TIMES DAILY BEFORE MEALS.   Lantus SoloStar 100 UNIT/ML Solostar Pen Generic drug: insulin glargine Inject 65 Units into the skin daily.   losartan 100 MG tablet Commonly known as: COZAAR Take 100 mg by mouth at bedtime.   metoprolol tartrate 100 MG tablet Commonly known as: LOPRESSOR TAKE 1 TABLET BY MOUTH TWICE A DAY   PRESCRIPTION MEDICATION CPAP: At bedtime   rosuvastatin 20 MG tablet Commonly known as: CRESTOR TAKE 1 TABLET BY MOUTH EVERY DAY   sevelamer carbonate 800 MG tablet Commonly known as: RENVELA Take 800 mg by mouth 3 (three) times daily with meals.   spironolactone 25 MG tablet Commonly known as: ALDACTONE Take 25 mg by mouth every morning.   tirzepatide 5 MG/0.5ML Pen Commonly known as: MOUNJARO Inject 5 mg into the skin once a week.   torsemide 100 MG tablet Commonly known as: DEMADEX Take 100 mg by mouth daily.               Durable Medical Equipment  (From admission, onward)           Start     Ordered   03/18/23 1407  For home use only DME Walker rolling  Once       Question Answer Comment  Walker: With 5 Inch Wheels   Patient needs a walker to treat with the following condition Gait instability      03/18/23 1407            Follow-up Information     Karie Schwalbe, MD Follow up.   Specialties: Internal Medicine, Pediatrics Contact information: [address] [place]  [place] [address] [phone]         [place] [facility] Follow up.   Why: Follow-up in office next week Tuesday, office to call and set up appointment time. Contact information: [address] [place], [place], [place] [address]  Phone: [phone]               Discharge Exam: Ceasar Mons Weights   03/17/23 2440 03/17/23 0808 03/17/23 1326  Weight: 112.2 kg 109 kg 109 kg   General exam: Awake, laying in bed, in nad Respiratory system: Normal respiratory effort, no wheezing Cardiovascular system: regular rate, s1, s2 Gastrointestinal system: Soft, nondistended, positive BS Central nervous system: CN2-12 grossly intact, strength intact Extremities: Perfused, no clubbing Skin: Normal skin turgor, no notable skin lesions seen Psychiatry: Mood normal // no visual hallucinations  Condition at discharge: fair  The results of significant diagnostics from this hospitalization (including imaging, microbiology, ancillary and laboratory) are listed below for reference.   Imaging Studies: DG Foot 2 Views Right Result Date: 03/17/2023 CLINICAL DATA:  Postoperative state. EXAM: RIGHT FOOT - 2 VIEW COMPARISON:  March 15, 2023. FINDINGS: Status post surgical resection of distal portion of fifth metatarsal. Moderate posterior calcaneal spurring is noted. No other significant bony abnormality is noted. IMPRESSION: Status post surgical resection of distal portion of fifth metatarsal. Electronically Signed   By: Lupita Raider M.D.   On: 03/17/2023 16:40   MR FOOT RIGHT W WO CONTRAST Result Date: 03/16/2023 CLINICAL DATA:  Right foot wound.  Callus removal a month ago. EXAM: MRI OF THE RIGHT FOREFOOT WITHOUT AND WITH CONTRAST TECHNIQUE: Multiplanar, multisequence MR imaging of the right forefoot was performed before and after the administration of intravenous contrast. CONTRAST:  10mL GADAVIST GADOBUTROL 1 MMOL/ML IV SOLN COMPARISON:  Right foot x-rays from yesterday.  FINDINGS: Bones/Joint/Cartilage Mild marrow edema with corresponding decreased T1 marrow signal involving the fifth metatarsal head. No additional worrisome marrow signal. No fracture or dislocation. Midfoot degenerative changes. No joint effusion. Ligaments Collateral ligaments are intact.  Lisfranc ligament is intact. Muscles and Tendons Flexor and extensor tendons are intact. No tenosynovitis. Increased T2 signal within and atrophy of the intrinsic muscles of the forefoot, nonspecific, but likely related to diabetic muscle changes. Soft tissue Soft tissue ulceration at the plantar aspect of the fifth metatarsal head extending near bone. No fluid collection. 7 x 10 x 9 mm ganglion cyst dorsal to the second TMT joint. IMPRESSION: 1. Soft tissue ulceration at the plantar aspect of the fifth metatarsal head extending near bone. 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 No abscess. Electronically Signed   By: Obie Dredge M.D.   On: 03/16/2023 11:53   DG Foot Complete Right Result Date: 03/16/2023 Please see detailed radiograph report in office note.  DG Foot Complete Right Result Date: 03/15/2023 CLINICAL DATA:  Foot wound EXAM: RIGHT FOOT COMPLETE - 3+ VIEW COMPARISON:  03/15/2023, 01/19/2023 FINDINGS: No fracture or malalignment. Similar osseous bridging across the third through fifth proximal metatarsals. Similar tarsal degenerative changes. Moderate plantar calcaneal spur. Wound plantar aspect of the distal foot, no acute osseous destructive change. No soft tissue emphysema IMPRESSION: Wound plantar aspect of the distal foot. No acute osseous destructive change. Electronically Signed   By: Jasmine Pang M.D.   On: 03/15/2023 22:03    Microbiology: Results for orders placed or performed during the hospital encounter of 03/15/23  Aerobic/Anaerobic Culture w Gram Stain (surgical/deep wound)     Status: None (Preliminary result)   Collection Time: 03/17/23  3:02 PM   Specimen:  Wound; Tissue  Result Value Ref Range Status   Specimen Description WOUND  Final   Special Requests RIGHT FIFTH METATARSAL  Final   Gram Stain NO WBC SEEN NO ORGANISMS SEEN   Final   Culture   Final    NO GROWTH < 12 HOURS Performed at [facility], [address], [place], [place] [address]    Report Status PENDING  Incomplete  Aerobic/Anaerobic Culture w Gram Stain (surgical/deep wound)     Status: None (Preliminary result)   Collection Time: 03/17/23  3:06 PM   Specimen: Wound; Tissue  Result Value Ref Range Status   Specimen Description TISSUE RIGHT FOOT  Final   Special Requests NONE  Final   Gram Stain   Final  RARE WBC PRESENT, PREDOMINANTLY PMN RARE GRAM POSITIVE COCCI    Culture   Final    NO GROWTH < 12 HOURS Performed at [facility], [address] [place], [place] [address]    Report Status PENDING  Incomplete  Aerobic/Anaerobic Culture w Gram Stain (surgical/deep wound)     Status: None (Preliminary result)   Collection Time: 03/17/23  3:18 PM   Specimen: Bone; Tissue  Result Value Ref Range Status   Specimen Description BONE  Final   Special Requests 5TH METATARSAL HEAD  Final   Gram Stain NO WBC SEEN NO ORGANISMS SEEN   Final   Culture   Final    NO GROWTH < 12 HOURS Performed at [facility], [address] [place], [place] [address]    Report Status PENDING  Incomplete    Labs: CBC: Recent Labs  Lab 03/15/23 1618 03/16/23 0410 03/18/23 0446  WBC 11.6* 11.0* 8.1  HGB 11.9* 11.4* 12.3*  HCT 35.4* 34.8* 37.0*  MCV 89.8 91.1 89.2  PLT 290 266 308   Basic Metabolic Panel: Recent Labs  Lab 03/15/23 1618 03/16/23 0410 03/17/23 0806 03/18/23 0446  NA 136 138 139 138  K 4.0 4.6 4.4 4.2  CL 106 109 104 102  CO2 19* 17* 18* 20*  GLUCOSE 97 111* 141* 179*  BUN 74* 78* 74* 74*  CREATININE 7.38* 7.76* 7.00* 7.05*  CALCIUM 8.7* 8.8* 9.3 9.2  PHOS  --  7.1*  --  6.8*   Liver Function Tests: Recent Labs  Lab  03/16/23 0410 03/18/23 0446  AST 11*  --   ALT 19  --   ALKPHOS 62  --   BILITOT 0.8  --   PROT 6.6  --   ALBUMIN 3.0* 3.2*   CBG: Recent Labs  Lab 03/17/23 1648 03/17/23 2011 03/18/23 0016 03/18/23 0439 03/18/23 1207  GLUCAP 117* 175* 206* 171* 190*    Discharge time spent: less than 30 minutes.  Signed: Rickey Barbara, MD [facility] 03/18/2023

## 2023-03-18 NOTE — Progress Notes (Signed)
  Subjective:  Patient ID: Samuel Reed, male    DOB: February 10, 1960,  MRN: [id number]  Chief Complaint  Patient presents with   Foot Injury    DOS: 03/17/2023 Procedure:  1.  Fifth metatarsal head resection, right foot 2.  Application dermal allograft 3.5 x 1.5 cm, right foot  63 y.o. male seen for post op check. He reports he is doing well denies pain this AM. Has been using walker to get around and trying to keep weight off right foot. Aware of findings from OR and plans going forward.   Review of Systems: Negative except as noted in the HPI. Denies N/V/F/Ch.   Objective:   Vitals:   03/17/23 2000 03/18/23 0400  BP: (!) 154/71 (!) 142/69  Pulse: 78 94  Resp: 16 16  Temp: 97.7 F (36.5 C) 98.1 F (36.7 C)  SpO2: 100% 100%   Body mass index is 32.59 kg/m. Constitutional Well developed. Well nourished.  Vascular Foot warm and well perfused. Capillary refill normal to all digits.   No calf pain with palpation  Neurologic Normal speech. Oriented to person, place, and time. 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  POD # 1 s/p fifth met head resection, Kerecis micro graft application and closure of wound -Progressing as expected postoperatively.  Wound completely excised and closed during procedure with graft applied -XR: Expected postop changes -WB Status: Nonweightbearing in postop shoe -Sutures: To remain intact 2 to 3 weeks. -Medications/ABX: Recommend 2 weeks p.o. antibiotics, okay with Augmentin and doxycycline -Dressing left clean dry and intact at this time, leave intact until  follow-up  -Follow-up in office next week Tuesday, will have office call him to set up.  will sign off at this time        Corinna Gab, DPM [facility] / [facility]

## 2023-03-19 ENCOUNTER — Telehealth: Payer: Self-pay

## 2023-03-19 LAB — SURGICAL PATHOLOGY

## 2023-03-19 NOTE — Transitions of Care (Post Inpatient/ED Visit) (Addendum)
 03/19/2023  Name: Samuel Reed MRN: [id number] DOB: 11-02-1960  Today's TOC FU Call Status: Today's TOC FU Call Status:: Successful TOC FU Call Completed TOC FU Call Complete Date: 03/19/23 Patient's Name and Date of Birth confirmed.  Transition Care Management Follow-up Telephone Call Date of Discharge: 03/18/23 Discharge Facility: [facility]) Type of Discharge: Inpatient Admission Primary Inpatient Discharge Diagnosis:: Infected diabetic right foot ulcer -Podiatry was consulted and pt underwent 5th metatarsal resection on R foot 2/19 -Podiatry recommended 2 weeks of augmentin and doxycycline on d/c with follow up in office next week How have you been since you were released from the hospital?: Better Any questions or concerns?: No  Items Reviewed: Did you receive and understand the discharge instructions provided?: Yes Medications obtained,verified, and reconciled?: Yes (Medications Reviewed) (Medication reconciliation completed based on recent discharge summary Patient taking medications as instructed and is aware of any changes or dosage adjustments medication regimen. Patient denies questions and reports no barriers to medication adherence) Any new allergies since your discharge?: No Dietary orders reviewed?: Yes Type of Diet Ordered:: Reg Heart Healthy Carb Modified, Renal  He uses a Libre device with parameter set for high/ low  SSI for coverage  Do you have support at home?: Yes People in Home: spouse Name of Support/Comfort Primary Source: Spouse Samuel Reed Daughter- Samuel Reed  Medications Reviewed Today: Medications Reviewed Today     Reviewed by Johnnette Barrios, RN (Registered Nurse) on 03/19/23 at 1109  Med List Status: <None>   Medication Order Taking? Sig Documenting Provider Last Dose Status Informant  amLODipine (NORVASC) 10 MG tablet [id number] Yes TAKE 1 TABLET BY MOUTH EVERY DAY Karie Schwalbe, MD Taking Active Self  amoxicillin-clavulanate (AUGMENTIN)  500-125 MG tablet [id number] Yes Take 1 tablet by mouth 2 (two) times daily for 14 days. Jerald Kief, MD Taking Active   cholecalciferol (VITAMIN D) 25 MCG (1000 UNIT) tablet [id number] Yes Take 2,000 Units by mouth daily with breakfast. [provider] Taking Active Self  Continuous Glucose Sensor (FREESTYLE LIBRE 3 PLUS SENSOR) MISC [id number] Yes Inject 1 Device into the skin continuous. Change every 15 days Carlus Pavlov, MD Taking Active Self  Continuous Glucose Sensor (FREESTYLE LIBRE 3 SENSOR) [place] [id number] Yes USE 1 SENSOR EVERY 14 DAYS Carlus Pavlov, MD Taking Active Self  doxycycline (VIBRA-TABS) 100 MG tablet [id number] Yes Take 1 tablet (100 mg total) by mouth 2 (two) times daily for 14 days. Jerald Kief, MD Taking Active   fluticasone [facility]) 50 MCG/ACT nasal spray [id number] Yes PLACE 1 SPRAY INTO BOTH NOSTRILS 2 (TWO) TIMES DAILY.  Patient taking differently: Place 2 sprays into both nostrils 2 (two) times daily.   Karie Schwalbe, MD Taking Active Self  gentamicin cream (GARAMYCIN) 0.1 % [id number] Yes Apply 1 application topically 2 (two) times daily.  Patient taking differently: Apply 1 application  topically daily.   Felecia Shelling, DPM Taking Active Self           Med Note (WHITE, Elvin So   Tue Mar 16, 2023  4:27 AM) Used prior to nightly dialysis  HYDROcodone-acetaminophen (NORCO/VICODIN) 5-325 MG tablet [id number] Yes Take 1 tablet by mouth every 4 (four) hours as needed for severe pain (pain score 7-10). Jerald Kief, MD Taking Active   insulin glargine (LANTUS SOLOSTAR) 100 UNIT/ML Solostar Pen [id number] Yes Inject 65 Units into the skin daily. Carlus Pavlov, MD Taking Active Self  insulin lispro [facility]) 100 UNIT/ML KwikPen [id number] Yes INJECT 10-20  UNITS TOTAL INTO THE SKIN 3 (THREE) TIMES DAILY BEFORE MEALS. Carlus Pavlov, MD Taking Active Self  Insulin Pen Needle (BD PEN NEEDLE NANO 2ND GEN) 32G X 4 MM MISC [id number] Yes USE TO  INJECT INSULIN 4 TIMES A Sammuel Bailiff, MD Taking Active Self  losartan (COZAAR) 100 MG tablet [id number] No Take 100 mg by mouth at bedtime.  Patient not taking: Reported on 03/19/2023   [provider] Not Taking Active Self           Med Note (WHITE, Elvin So   Tue Mar 16, 2023  4:45 AM) Unable to confirm ths as an acitve med per DrFirst, since there is no refill per Dr.First.  metoprolol tartrate (LOPRESSOR) 100 MG tablet [id number] Yes TAKE 1 TABLET BY MOUTH TWICE A DAY Karie Schwalbe, MD Taking Active Self  PRESCRIPTION MEDICATION [id number] Yes CPAP: At bedtime [provider] Taking Active Self  rosuvastatin (CRESTOR) 20 MG tablet [id number] Yes TAKE 1 TABLET BY MOUTH EVERY DAY Karie Schwalbe, MD Taking Active Self  sevelamer carbonate (RENVELA) 800 MG tablet [id number] Yes Take 800 mg by mouth 3 (three) times daily with meals. [provider] Taking Active Self  spironolactone (ALDACTONE) 25 MG tablet [id number] Yes Take 25 mg by mouth every morning. [provider] Taking Active   tirzepatide [facility]) 5 MG/0.5ML Pen [id number] Yes Inject 5 mg into the skin once a week. Carlus Pavlov, MD Taking Active Self  torsemide [facility]) 100 MG tablet [id number] Yes Take 100 mg by mouth daily. [provider] Taking Active Self          Medication reconciliation / review completed based on most recent discharge summary and EHR medication list. Confirmed patient is taking all newly prescribed medications as instructed (any discrepancies are noted in review section)   Patient / Caregiver is aware of any changes to and / or  any dosage adjustments to medication regimen. Patient/ Caregiver denies questions at this time and reports no barriers to medication adherence.    Home Care and Equipment/Supplies: Were Home Health Services Ordered?: No Any new equipment or medical supplies ordered?: No  Functional Questionnaire: Do you need assistance  with bathing/showering or dressing?: No Do you need assistance with meal preparation?: No Do you need assistance with eating?: No Do you have difficulty maintaining continence: No Do you need assistance with getting out of bed/getting out of a chair/moving?: No (He is using a walker to keep weight off right foot) Do you have difficulty managing or taking your medications?: No  Follow up appointments reviewed: PCP Follow-up appointment confirmed?: NA (PCP called patient while in hosital and has spoken with him post discharge) Specialist Hospital Follow-up appointment confirmed?: Yes Date of Specialist follow-up appointment?: 03/23/23 Follow-Up Specialty Provider:: [place] [facility] Do you need transportation to your follow-up appointment?: No (Daughter will transport) Do you understand care options if your condition(s) worsen?: Yes-patient verbalized understanding  SDOH Interventions Today    Flowsheet Row Most Recent Value  SDOH Interventions   Food Insecurity Interventions Intervention Not Indicated  Housing Interventions Intervention Not Indicated  Transportation Interventions Intervention Not Indicated, Patient Resources (Friends/Family)  Utilities Interventions Intervention Not Indicated       Goals Addressed             This Visit's Progress    TOC Care Plan       Current Barriers:  Medication management New Antibiotic for wound infection  Provider  appointments PCP aware of hospital discharge and he will [facility]  Equipment/DME ambulatory assist to avoid weight on left foot  Chronic Disease Management support and education needs related to ESRD- on home based PD q hs  and Diabetic foot ulcer care    RNCM Clinical Goal(s):  Patient will work with the Care Management team over the next 30 days to address Transition of Care Barriers: Medication Management Provider appointments Ongoing wound care for diabetic  foot ulcer  take all medications exactly as prescribed and will call provider for medication related questions as evidenced by no missed medication doses  attend all scheduled medical appointments: as evidenced by no missed follow-up visit demonstrate ongoing self health care management ability to resume normal activity  as evidenced by healing diabetic foot ulcer through collaboration with RN Care manager, provider, and care team.   Interventions: Evaluation of current treatment plan related to  self management and patient's adherence to plan as established by provider  Transitions of Care:  New goal. Doctor Visits  - discussed the importance of doctor visits Post discharge activity limitations prescribed by provider reviewed Post-op wound/incision care reviewed with patient/caregiver Reviewed Signs and symptoms of infection   Chronic Kidney Disease Interventions:  (Status:  New goal.) Short Term Goal Evaluation of current treatment plan related to chronic kidney disease self management and patient's adherence to plan as established by provider      Reviewed prescribed diet Reg Heart Healthy Carb Modified Renal  Reviewed medications with patient and discussed importance of compliance    Advised patient, providing education and rationale, to monitor blood pressure daily and record, calling PCP for findings outside established parameters    Reviewed scheduled/upcoming provider appointments including    Discussed plans with patient for ongoing care management follow up and provided patient with direct contact information for care management team    Assessed social determinant of health barriers    Last practice recorded BP readings:  BP Readings from Last 3 Encounters:  03/18/23 (!) 142/69  01/22/23 120/70  01/19/23 (!) 148/70   Most recent eGFR/CrCl: No results found for: "EGFR"  No components found for: "CRCL"  Patient Goals/Self-Care Activities: Participate in Transition of Care  Program/Attend TOC scheduled calls Take all medications as prescribed Attend all scheduled provider appointments Call pharmacy for medication refills 3-7 days in advance of running out of medications Attend church or other social activities Perform all self care activities independently  Perform IADL's (shopping, preparing meals, housekeeping, managing finances) independently Call provider office for new concerns or questions   Follow Up Plan:  Telephone follow up appointment with care management team member scheduled for:  03/24/23 @ 2:30pm with Deidre Ala RN  The patient has been provided with contact information for the care management team and has been advised to call with any health related questions or concerns.          Patient is at high risk for readmission and/or has history of  high utilization  Discussed VBCI  TOC program and weekly calls to patient to assess condition/status, medication management  and provide support/education as indicated . Patient/ Caregiver voiced understanding and is  agreeable to 30 day program   He checks his weights daily, ad is aware of parameters to report to PCP  He performs his Peritoneal dialysis at home every evening.  He wears CPAP at night and He continues using a Libre device to check BG and follows SSI for coverage along  with nightly dose of Lantus as ordered and  reports no issues with these processes   The patient has been provided with contact information for the care management team and has been advised to call with any health-related questions or concerns.  Susa Loffler , BSN, RN [facility]   VBCI-Population Health RN Care Manager Direct Dial [phone]  Fax: [phone] Website: [email or web address]

## 2023-03-19 NOTE — Patient Instructions (Signed)
 Visit Information  Thank you for taking time to visit with me today. Please don't hesitate to contact me if I can be of assistance to you before our next scheduled telephone appointment.  Your next appointment is by telephone on  03/24/23 at 2:30pm with Deidre Ala, RN   Following is a copy of your care plan:   Goals Addressed             This Visit's Progress    TOC Care Plan       Current Barriers:  Medication management New Antibiotic for wound infection  Provider appointments PCP aware of hospital discharge and he will follow-up with [facility]  Equipment/DME ambulatory assist to avoid weight on left foot  Chronic Disease Management support and education needs related to ESRD- on home based PD q hs  and Diabetic foot ulcer care    RNCM Clinical Goal(s):  Patient will work with the Care Management team over the next 30 days to address Transition of Care Barriers: Medication Management Provider appointments Ongoing wound care for diabetic foot ulcer  take all medications exactly as prescribed and will call provider for medication related questions as evidenced by no missed medication doses  attend all scheduled medical appointments: as evidenced by no missed follow-up visit demonstrate ongoing self health care management ability to resume normal activity  as evidenced by healing diabetic foot ulcer through collaboration with RN Care manager, provider, and care team.   Interventions: Evaluation of current treatment plan related to  self management and patient's adherence to plan as established by provider  Transitions of Care:  New goal. Doctor Visits  - discussed the importance of doctor visits Post discharge activity limitations prescribed by provider reviewed Post-op wound/incision care reviewed with patient/caregiver Reviewed Signs and symptoms of infection   Chronic Kidney Disease Interventions:  (Status:  New goal.) Short Term Goal Evaluation of current  treatment plan related to chronic kidney disease self management and patient's adherence to plan as established by provider      Reviewed prescribed diet Reg Heart Healthy Carb Modified Renal  Reviewed medications with patient and discussed importance of compliance    Advised patient, providing education and rationale, to monitor blood pressure daily and record, calling PCP for findings outside established parameters    Reviewed scheduled/upcoming provider appointments including    Discussed plans with patient for ongoing care management follow up and provided patient with direct contact information for care management team    Assessed social determinant of health barriers    Last practice recorded BP readings:  BP Readings from Last 3 Encounters:  03/18/23 (!) 142/69  01/22/23 120/70  01/19/23 (!) 148/70   Most recent eGFR/CrCl: No results found for: "EGFR"  No components found for: "CRCL"  Patient Goals/Self-Care Activities: Participate in Transition of Care Program/Attend TOC scheduled calls Take all medications as prescribed Attend all scheduled provider appointments Call pharmacy for medication refills 3-7 days in advance of running out of medications Attend church or other social activities Perform all self care activities independently  Perform IADL's (shopping, preparing meals, housekeeping, managing finances) independently Call provider office for new concerns or questions   Follow Up Plan:  Telephone follow up appointment with care management team member scheduled for:  03/24/23 @ 2:30pm with Deidre Ala RN  The patient has been provided with contact information for the care management team and has been advised to call with any health related questions or concerns.  Medication review  Reviewed current home medications -- provided education as needed. Patient is aware of potential side effects and was encouraged to notify PCP for any adverse side effects or unwanted  symptoms not relieved with interventions  Patient will call 911 for Medical Emergencies or Life -Threatening Symptoms.  Reviewed goals for care Patient/ Caregiver verbalizes understanding of instructions with the plan of care . The  Patient / Caregiver was encouraged to make informed decisions about care, actively participate in managing health conditions, and implement lifestyle changes as needed to promote independence and self-management of healthcare. SDOH screenings have been completed and addressed if indicted.  There are no reported barriers to care.    Follow-up Plan VBCI Case Management Nurse will provide follow-up and on-going assessment ,evaluation and education of disease processes, recommended interventions for both chronic and acute medical conditions ,  along with ongoing review of symptoms ,medication reviews / reconciliation during each weekly call . Any updates , inconsistencies, discrepancies or acute care concerns will be addressed and routed to the correct Practitioner if indicated   [facility]  Please call the care guide team at [phone]  if you need to cancel or reschedule your appointment . For scheduled calls -Three attempts will be made to reach you -if the scheduled call is missed or  we are unable to reach the you after 3 attempts no additional outreach attempts will be made and the TOC follow-up will be closed .   If you need to speak to a Nurse you may  call me directly at the number below or if I am unavailable,and  your need is urgent  please call the main VBCI number at [phone] and ask to speak with one of the [facility] ( Transition of Care )  Nurses  .  Patient was encouraged to Contact PCP with any changes in baseline or  medication regimen,  changes in health status  /  well-being, safety concerns, including falls any questions or concerns regarding ongoing medical care, any difficulty obtaining or picking up prescriptions, any changes or worsening  in condition- including  symptoms not relieved  with interventions                                                                            Additionally, If you experience worsening of your symptoms, develop shortness of breath, If you are experiencing a medical emergency,  develop suicidal or homicidal thoughts you must seek medical attention immediately by calling 911 or report to your local emergency department or urgent care.   If you have a non-emergency medical problem during routine business hours, please contact your provider's office and ask to speak with a nurse.       Please take the time to read instructions/literature along with the possible adverse reactions/side effects for all the Medicines that have been prescribed to you. Only take newly prescribed  Medications after you have completely understood and accept all the possible adverse reactions/side effects.   Do not take more than prescribed Medications for  Pain, Sleep and Anxiety. Do not drive when taking Pain medications or sleep aid/ insomnia  medications It is not advisable to combine anxiety, sleep and pain  medications without talking with your primary care practitioner    If you are experiencing a Mental Health or Behavioral Health Crisis or need someone to talk to Please call the Suicide and Crisis Lifeline: 988 You may also call the [place] National Suicide Prevention Lifeline: (831)337-7373 or TTY: 301-865-8785 TTY (863)223-4325) to talk to a trained counselor.  You may call the Behavioral Health Crisis Line at 253 663 8624, at any time, 24 hours a day, 7 days a week- however If you are in danger or need immediate medical attention, call 911.   If you would like help to quit smoking, call 1-800-QUIT-NOW ( 660-856-0173) OR Espaol: 1-855-Djelo-Ya (7-253-664-4034) o para ms informacin haga clic aqu or Text READY to 742-595 to register via text.   Susa Loffler , BSN, RN [place]   VBCI-Population  Health RN Care Manager Direct Dial [phone]  Website: [email or web address]

## 2023-03-20 LAB — AEROBIC/ANAEROBIC CULTURE W GRAM STAIN (SURGICAL/DEEP WOUND)

## 2023-03-23 ENCOUNTER — Other Ambulatory Visit (HOSPITAL_COMMUNITY): Payer: Self-pay

## 2023-03-23 ENCOUNTER — Encounter: Payer: Self-pay | Admitting: Podiatry

## 2023-03-23 ENCOUNTER — Ambulatory Visit (INDEPENDENT_AMBULATORY_CARE_PROVIDER_SITE_OTHER): Payer: Medicare Other | Admitting: Podiatry

## 2023-03-23 DIAGNOSIS — L03031 Cellulitis of right toe: Secondary | ICD-10-CM

## 2023-03-23 DIAGNOSIS — L02611 Cutaneous abscess of right foot: Secondary | ICD-10-CM

## 2023-03-23 DIAGNOSIS — L97512 Non-pressure chronic ulcer of other part of right foot with fat layer exposed: Secondary | ICD-10-CM

## 2023-03-23 DIAGNOSIS — E0843 Diabetes mellitus due to underlying condition with diabetic autonomic (poly)neuropathy: Secondary | ICD-10-CM

## 2023-03-23 LAB — AEROBIC/ANAEROBIC CULTURE W GRAM STAIN (SURGICAL/DEEP WOUND): Gram Stain: NONE SEEN

## 2023-03-23 NOTE — Progress Notes (Signed)
  Subjective:  Patient ID: Molinda Bailiff, male    DOB: [birth year],  MRN: [id number]  Chief Complaint  Patient presents with   Routine Post Op    RM22: hospital post op/dos 03/17/23/ right met head resection ( diabetic )    DOS: 03/17/2023 Procedure:  1.  Fifth metatarsal head resection, right foot 2.  Application dermal allograft 3.5 x 1.5 cm, right foot  63 y.o. male seen for post op check.  Following up for first hospital follow-up. He is doing well using a walker for mobility. Denies pain. Has kept dressing c/d/I since hospital as instructed.   Review of Systems: Negative except as noted in the HPI. Denies N/V/F/Ch.   Objective:   There were no vitals filed for this visit.  There is no height or weight on file to calculate BMI. Constitutional Well developed. Well nourished.  Vascular Foot warm and well perfused. Capillary refill normal to all digits.   No calf pain with palpation  Neurologic Normal speech. Oriented to person, place, and time. Epicritic sensation diminished to forefoot  Dermatologic At the plantar aspect of the fifth MPJ on the right foot there is a small wound at site of prior primary closure with maceration of the tissue margins however there is no erythema or drainage.  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  1 week s/p fifth met head resection, Kerecis micro graft application and closure of wound -Progressing well with mild dehiscence at the central aspect of the surgical site though the graft appears intact and healthy without evidence of residual  infection -XR: Deferred today will consider next appointment -WB Status: Nonweightbearing in postop shoe, using a walker to get around -Sutures: To remain intact 2 to 3 weeks. -Medications/ABX: Continue 2 weeks oral antibiotics as previously instructed he is taking doxycycline and Augmentin -Dressing changed and applied Betadine and then Aquacel Ag 4 x 4 Kerlix Ace wrap dressing -Follow-up in office next week for wound check        Corinna Gab, DPM [facility]

## 2023-03-24 ENCOUNTER — Other Ambulatory Visit: Payer: Self-pay

## 2023-03-24 LAB — AEROBIC/ANAEROBIC CULTURE W GRAM STAIN (SURGICAL/DEEP WOUND): Gram Stain: NONE SEEN

## 2023-03-24 NOTE — Patient Outreach (Signed)
 Care Management  Transitions of Care Program Transitions of Care Post-discharge week 2   03/24/2023 Name: CLEOTIS SPARR MRN: [id number] DOB: 1960-10-06  Subjective: Samuel Reed is a 63 y.o. year old male who is a primary care patient of Karie Schwalbe, MD. The Care Management team Engaged with patient Engaged with patient by telephone to assess and address transitions of care needs.   Consent to Services:  Patient was given information about care management services, agreed to services, and gave verbal consent to participate.   Assessment: TOC Outreach completed to the patient. He states he went yesterday to the podiatrist and had his post op wound check and it is healing well. He has a dressing over it and he is not allowed to bear weight. He states he uses a walker and walks on his heel. He has no pain. He spends his days riding around with friends on various errands. He states he really doesn't need additional outreaches and has been discharged from the program. He declines PCP follow up appointment.   SDOH Interventions    Flowsheet Row Patient Outreach from 03/24/2023 in [facility] Telephone from 03/19/2023 in [facility]PART[facility]  SDOH Interventions    Food Insecurity Interventions Intervention Not Indicated Intervention Not Indicated  Housing Interventions Intervention Not Indicated Intervention Not Indicated  Transportation Interventions Intervention Not Indicated Intervention Not Indicated, Patient Resources (Friends/Family)  Utilities Interventions Intervention Not Indicated Intervention Not Indicated        Goals Addressed             This Visit's Progress    COMPLETED: TOC Care Plan   On track    Current Barriers: (completed 03/24/23) Medication management New Antibiotic for wound infection  Provider appointments PCP aware of hospital discharge and [facility]  Equipment/DME  ambulatory assist to avoid weight on left foot  Chronic Disease Management support and education needs related to ESRD- on home based PD q hs  and Diabetic foot ulcer care    RNCM Clinical Goal(s): (completed 03/24/23) Patient will work with the Care Management team over the next 30 days to address Transition of Care Barriers: Medication Management Provider appointments Ongoing wound care for diabetic foot ulcer  take all medications exactly as prescribed and will call provider for medication related questions as evidenced by no missed medication doses  attend all scheduled medical appointments: as evidenced by no missed follow-up visit demonstrate ongoing self health care management ability to resume normal activity  as evidenced by healing diabetic foot ulcer through collaboration with RN Care manager, provider, and care team.   Interventions: (completed 03/24/23) Evaluation of current treatment plan related to  self management and patient's adherence to plan as established by provider  Transitions of Care:  New goal. (completed 03/24/23) Doctor Visits  - discussed the importance of doctor visits Post discharge activity limitations prescribed by provider reviewed Post-op wound/incision care reviewed with patient/caregiver Reviewed Signs and symptoms of infection   (completed 03/24/23)  Chronic Kidney Disease Interventions:  (Status:  Goal on track:  Yes.) Short Term Goal Evaluation of current treatment plan related to chronic kidney disease self management and patient's adherence to plan as established by provider      Reviewed prescribed diet Reg Heart Healthy Carb Modified Renal  Reviewed medications with patient and discussed importance of compliance    Advised patient, providing education and rationale, to monitor blood pressure daily and record, calling  PCP for findings outside established parameters    Reviewed scheduled/upcoming provider appointments including    Discussed plans with  patient for ongoing care management follow up and provided patient with direct contact information for care management team    Assessed social determinant of health barriers    Last practice recorded BP readings:  BP Readings from Last 3 Encounters:  03/18/23 (!) 142/69  01/22/23 120/70  01/19/23 (!) 148/70   Most recent eGFR/CrCl: No results found for: "EGFR"  No components found for: "CRCL"  Patient Goals/Self-Care Activities: (completed 03/24/23) Participate in Transition of Care Program/Attend TOC scheduled calls Take all medications as prescribed Attend all scheduled provider appointments Call pharmacy for medication refills 3-7 days in advance of running out of medications Attend church or other social activities Perform all self care activities independently  Perform IADL's (shopping, preparing meals, housekeeping, managing finances) independently Call provider office for new concerns or questions   Follow Up Plan:  The patient has stated that he does not feel that he needs further phone calls for education and support and declines further Outreach Management         Plan: The patient has been provided with contact information for the care management team and has been advised to call with any health related questions or concerns.   Deidre Ala, BSN, RN [place]  VBCI - [employer] [facility] RN Care Manager [phone]

## 2023-03-24 NOTE — Patient Instructions (Signed)
 Visit Information  Thank you for taking time to visit with me today. Please don't hesitate to contact me if I can be of assistance to you before our next scheduled telephone appointment.   Following is a copy of your care plan:   Goals Addressed             This Visit's Progress    COMPLETED: TOC Care Plan   On track    Current Barriers: (completed 03/24/23) Medication management New Antibiotic for wound infection  Provider appointments PCP aware of hospital discharge and he [facility]  Equipment/DME ambulatory assist to avoid weight on left foot  Chronic Disease Management support and education needs related to ESRD- on home based PD q hs  and Diabetic foot ulcer care    RNCM Clinical Goal(s): (completed 03/24/23) Patient will work with the Care Management team over the next 30 days to address Transition of Care Barriers: Medication Management Provider appointments Ongoing wound care for diabetic foot ulcer  take all medications exactly as prescribed and will call provider for medication related questions as evidenced by no missed medication doses  attend all scheduled medical appointments: as evidenced by no missed follow-up visit demonstrate ongoing self health care management ability to resume normal activity  as evidenced by healing diabetic foot ulcer through collaboration with RN Care manager, provider, and care team.   Interventions: (completed 03/24/23) Evaluation of current treatment plan related to  self management and patient's adherence to plan as established by provider  Transitions of Care:  New goal. (completed 03/24/23) Doctor Visits  - discussed the importance of doctor visits Post discharge activity limitations prescribed by provider reviewed Post-op wound/incision care reviewed with patient/caregiver Reviewed Signs and symptoms of infection   (completed 03/24/23)  Chronic Kidney Disease Interventions:  (Status:  Goal on track:   Yes.) Short Term Goal Evaluation of current treatment plan related to chronic kidney disease self management and patient's adherence to plan as established by provider      Reviewed prescribed diet Reg Heart Healthy Carb Modified Renal  Reviewed medications with patient and discussed importance of compliance    Advised patient, providing education and rationale, to monitor blood pressure daily and record, calling PCP for findings outside established parameters    Reviewed scheduled/upcoming provider appointments including    Discussed plans with patient for ongoing care management follow up and provided patient with direct contact information for care management team    Assessed social determinant of health barriers    Last practice recorded BP readings:  BP Readings from Last 3 Encounters:  03/18/23 (!) 142/69  01/22/23 120/70  01/19/23 (!) 148/70   Most recent eGFR/CrCl: No results found for: "EGFR"  No components found for: "CRCL"  Patient Goals/Self-Care Activities: (completed 03/24/23) Participate in Transition of Care Program/Attend TOC scheduled calls Take all medications as prescribed Attend all scheduled provider appointments Call pharmacy for medication refills 3-7 days in advance of running out of medications Attend church or other social activities Perform all self care activities independently  Perform IADL's (shopping, preparing meals, housekeeping, managing finances) independently Call provider office for new concerns or questions   Follow Up Plan:  The patient has stated that he does not feel that he needs further phone calls for education and support and declines further Outreach Management         Patient verbalizes understanding of instructions and care plan provided today and agrees to view in [place]. Active MyChart status  and patient understanding of how to access instructions and care plan via MyChart confirmed with patient.     The patient has been provided  with contact information for the care management team and has been advised to call with any health related questions or concerns.   Please call the care guide team at [phone] if you need to cancel or reschedule your appointment.   Please call the Suicide and Crisis Lifeline: 988 call the [place] National Suicide Prevention Lifeline: (234)456-8587 or TTY: 534-718-5974 TTY 918-374-2747) to talk to a trained counselor if you are experiencing a Mental Health or Behavioral Health Crisis or need someone to talk to.  Deidre Ala, BSN, RN   VBCI - [employer] [facility] RN Care Manager [phone]

## 2023-04-01 ENCOUNTER — Encounter: Payer: Self-pay | Admitting: Podiatry

## 2023-04-01 ENCOUNTER — Ambulatory Visit (INDEPENDENT_AMBULATORY_CARE_PROVIDER_SITE_OTHER): Payer: Medicare Other | Admitting: Podiatry

## 2023-04-01 VITALS — Ht 72.0 in | Wt 240.3 lb

## 2023-04-01 DIAGNOSIS — L97512 Non-pressure chronic ulcer of other part of right foot with fat layer exposed: Secondary | ICD-10-CM

## 2023-04-01 DIAGNOSIS — Z9889 Other specified postprocedural states: Secondary | ICD-10-CM

## 2023-04-01 NOTE — Progress Notes (Signed)
  Subjective:  Patient ID: Molinda Bailiff, male    DOB: 1960/11/26,  MRN: [id number]  Chief Complaint  Patient presents with   Routine Post Op    POST OP - RM21 hospital post op/dos 03/17/23/ right met head resection( diabetic ) pt states his foot feels fine, and that he can wiggle his toe so everything is working how it suppose to, little to no pain.    DOS: 03/17/2023 Procedure:  1.  Fifth metatarsal head resection, right foot 2.  Application dermal allograft 3.5 x 1.5 cm, right foot  63 y.o. male seen for post op check.  Patient reports he is doing well he is using a walker to ambulate.  He is wearing a postop shoe.  Kept The dressing clean dry and intact as instructed.  Finishing his course of Augmentin today and has a few days left of doxycycline  Review of Systems: Negative except as noted in the HPI. Denies N/V/F/Ch.   Objective:   There were no vitals filed for this visit.  Body mass index is 32.59 kg/m. Constitutional Well developed. Well nourished.  Vascular Foot warm and well perfused. Capillary refill normal to all digits.   No calf pain with palpation  Neurologic Normal speech. Oriented to person, place, and time. Epicritic sensation diminished to forefoot  Dermatologic At the plantar aspect of the fifth MPJ there is eschar overlying longitudinal ulceration however appears improved from prior.  Decreased maceration from prior.  Debrided the eschar and sutures and there was healthy tissue underlying.  No evidence of infection or purulent drainage.   Orthopedic: Status post fifth metatarsal head resection edema   Radiographs: Status post resection of the distal portion of the fifth metatarsal  Pathology:  A.  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  2 week s/p fifth met head resection, Kerecis micro graft application and closure of wound -Progressing well with mild dehiscence at the central aspect of the surgical site, slightly improved from prior with the ongoing wound care and biotics -XR: Deferred today will consider next appointment -WB Status: Weightbearing in postop shoe, using a walker to get around -Sutures: Removed in total today and debrided the eschar overlying the wound to allow for increased healing -Medications/ABX: Finish course of oral antibiotics as previously prescribed and monitor off -Dressing changed and applied Iodosorb and adhesive bandage dressing.  Will have patient begin every 2 to 3-day dressing change with gentamicin ointment followed by gauze dressing -Follow-up in office in 2 week for wound check        Corinna Gab, DPM [facility] / [facility] Book

## 2023-04-06 ENCOUNTER — Encounter: Payer: Self-pay | Admitting: Podiatry

## 2023-04-06 ENCOUNTER — Ambulatory Visit (INDEPENDENT_AMBULATORY_CARE_PROVIDER_SITE_OTHER): Admitting: Podiatry

## 2023-04-06 VITALS — Ht 72.0 in | Wt 240.3 lb

## 2023-04-06 DIAGNOSIS — L97512 Non-pressure chronic ulcer of other part of right foot with fat layer exposed: Secondary | ICD-10-CM

## 2023-04-06 DIAGNOSIS — Z9889 Other specified postprocedural states: Secondary | ICD-10-CM

## 2023-04-06 MED ORDER — GENTAMICIN SULFATE 0.1 % EX OINT: 1.0000 | TOPICAL_OINTMENT | Freq: Every day | CUTANEOUS | 0 refills | Status: DC

## 2023-04-06 NOTE — Progress Notes (Signed)
  Subjective:  Patient ID: Molinda Bailiff, male    DOB: 1960-08-24,  MRN: [id number]  Chief Complaint  Patient presents with   Routine Post Op    POV# 2 hospital post op/dos 03/17/23/ right met head resection( diabetic ) pt states wound has came open just a little, has no other complaints.    DOS: 03/17/2023 Procedure:  1.  Fifth metatarsal head resection, right foot 2.  Application dermal allograft 3.5 x 1.5 cm, right foot  63 y.o. male seen for post op check.   Patient reports that his wound has opened up slightly.  He states it is not draining very much if at all.  Has been using gentamicin ointment and applying a gauze bandage and changing daily.  Finishing his antibiotics as instructed   Review of Systems: Negative except as noted in the HPI. Denies N/V/F/Ch.   Objective:   There were no vitals filed for this visit.  Body mass index is 32.59 kg/m. Constitutional Well developed. Well nourished.  Vascular Foot warm and well perfused. Capillary refill normal to all digits.   No calf pain with palpation  Neurologic Normal speech. Oriented to person, place, and time. Epicritic sensation diminished to forefoot  Dermatologic Small superficial dehiscence of prior incision site plantar aspect of the fifth metatarsal head resection with healthy granular tissue no evidence of deep infection wound appears to be healing mild hyperkeratotic tissue on the borders overall similar to prior visit appearance without malodor or erythema surrounding     Orthopedic: Status post fifth metatarsal head resection edema   Radiographs: Status post resection of the distal portion of the fifth metatarsal  Pathology:  A.  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  3 week s/p fifth met head resection, Kerecis micro graft application and closure of wound -Progressing well with stable mild dehiscence at the central aspect of the surgical site  -XR: Deferred today will consider next appointment -WB Status: Weightbearing in postop shoe, using a walker to get around -Sutures: Previously removed -Wound debrided today new dressing applied -Medications/ABX: Finish course of oral antibiotics as previously prescribed and monitor off -Dressing changed recommend we continue with the gentamicin ointment and gauze dressings -Follow-up in office in 2 week for wound check        Corinna Gab, DPM [facility] / [facility] Book

## 2023-04-13 ENCOUNTER — Encounter: Admitting: Podiatry

## 2023-04-20 ENCOUNTER — Encounter: Payer: Self-pay | Admitting: Podiatry

## 2023-04-20 ENCOUNTER — Ambulatory Visit (INDEPENDENT_AMBULATORY_CARE_PROVIDER_SITE_OTHER): Admitting: Podiatry

## 2023-04-20 DIAGNOSIS — Z9889 Other specified postprocedural states: Secondary | ICD-10-CM

## 2023-04-20 DIAGNOSIS — L97512 Non-pressure chronic ulcer of other part of right foot with fat layer exposed: Secondary | ICD-10-CM

## 2023-04-20 NOTE — Progress Notes (Signed)
  Subjective:  Patient ID: Samuel Reed, male    DOB: Jun 18, 1960,  MRN: [id number]  Chief Complaint  Patient presents with   Routine Post Op    Rm 21: POV#3: hospital post op/dos 03/17/23/ right met head resection( diabetic ). Pt states other than neuropathy pain he has no pains. No draining noted.     DOS: 03/17/2023 Procedure:  1.  Fifth metatarsal head resection, right foot 2.  Application dermal allograft 3.5 x 1.5 cm, right foot  63 y.o. male seen for post op check.  Previously been treated for dehiscence of surgical site with local wound care.  He reports has been doing well he denies drainage denies pain.  Has been wearing postop shoe walking with walker doing daily gentamicin ointment dressing changes to the right foot   Review of Systems: Negative except as noted in the HPI. Denies N/V/F/Ch.   Objective:   There were no vitals filed for this visit.  There is no height or weight on file to calculate BMI. Constitutional Well developed. Well nourished.  Vascular Foot warm and well perfused. Capillary refill normal to all digits.   No calf pain with palpation  Neurologic Normal speech. Oriented to person, place, and time. Epicritic sensation diminished to forefoot  Dermatologic Small superficial dehiscence of prior incision improved from prior with near complete healing very small hairline fissure but no active drainage maceration malodor or other evidence of infection    Orthopedic: Status post fifth metatarsal head resection edema   Radiographs: Status post resection of the distal portion of the fifth metatarsal  Pathology:  A.  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  5 week s/p fifth met head resection, Kerecis micro graft application and closure of wound -Progressing well with improved wound healing at the site of small superficial dehiscence.  Nearly fully healed today -Continue wound care with gentamicin ointment and adhesive bandage or gauze dressing change daily -XR: Deferred today   -WB Status: Weightbearing in postop shoe  -Wound debrided today new dressing applied -Medications/ABX: Monitor off antibiotics -Follow-up in office in 2 week for wound check        Corinna Gab, DPM [facility] / [facility]

## 2023-04-21 ENCOUNTER — Other Ambulatory Visit: Payer: Self-pay | Admitting: Internal Medicine

## 2023-04-21 DIAGNOSIS — E1165 Type 2 diabetes mellitus with hyperglycemia: Secondary | ICD-10-CM

## 2023-04-22 ENCOUNTER — Telehealth: Payer: Self-pay | Admitting: Dietician

## 2023-04-22 NOTE — Telephone Encounter (Signed)
 Patient called and left a message on our voice mail that he would like to increase his mounjaro to the next dose.  Message sent to MA.  Oran Rein, RD, LDN, CDCES, DipACLM

## 2023-04-26 NOTE — Telephone Encounter (Signed)
 Medication was just increased in January

## 2023-04-29 IMAGING — DX DG FOOT COMPLETE 3+V*L*
2 series · 3 of 3 positions shown · non-contrast
Comparison: None.

CLINICAL DATA: Left foot ulcer, initial encounter

EXAM:
LEFT FOOT - COMPLETE 3+ VIEW

[Series 1: foot · 0.14mm/px · 2 of 2 slices shown]
[im 1/2]
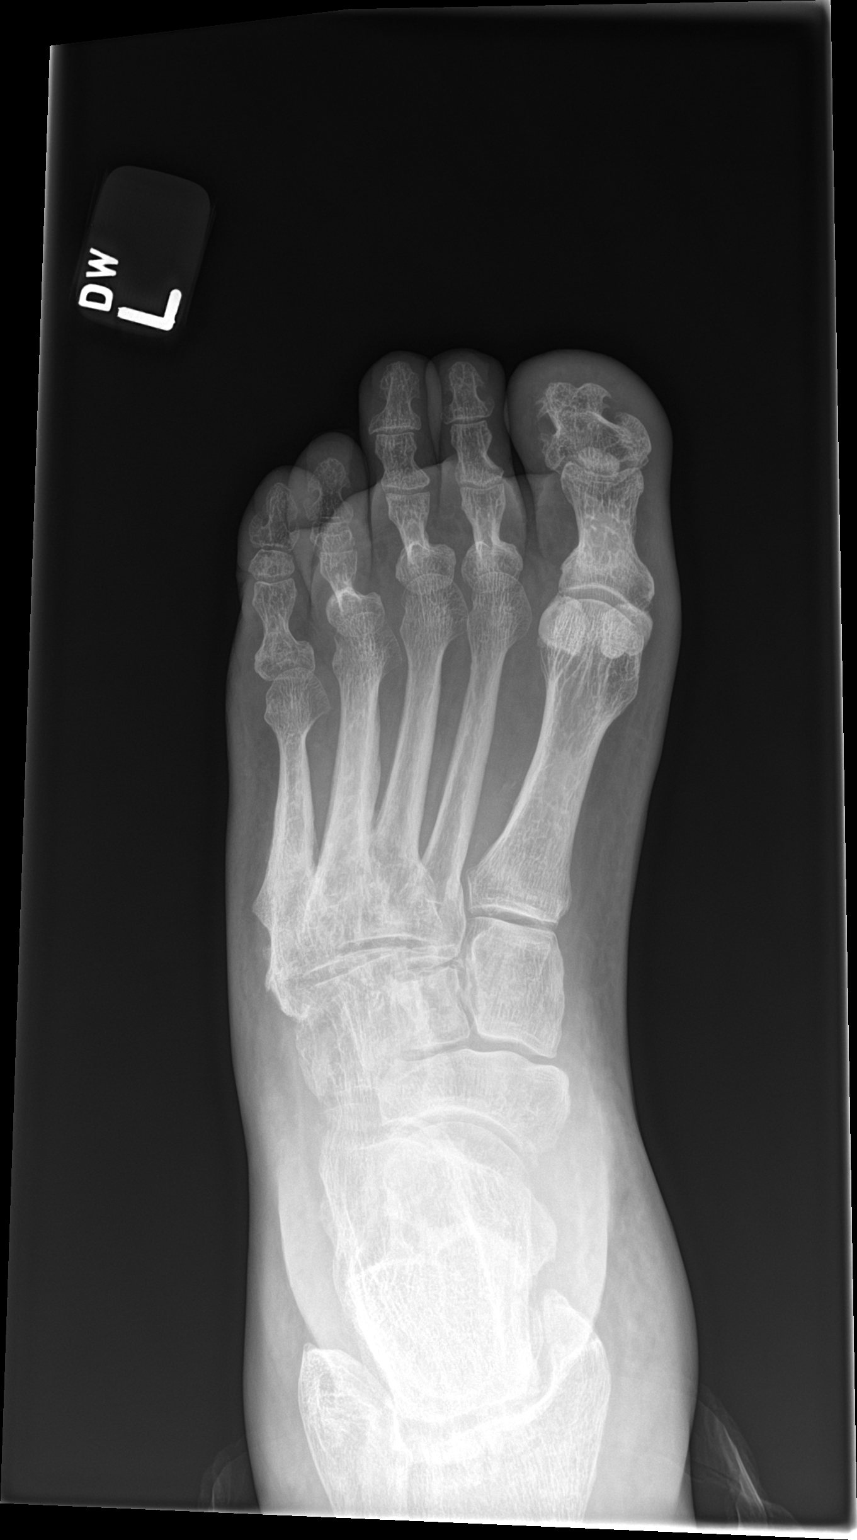
[im 2/2]
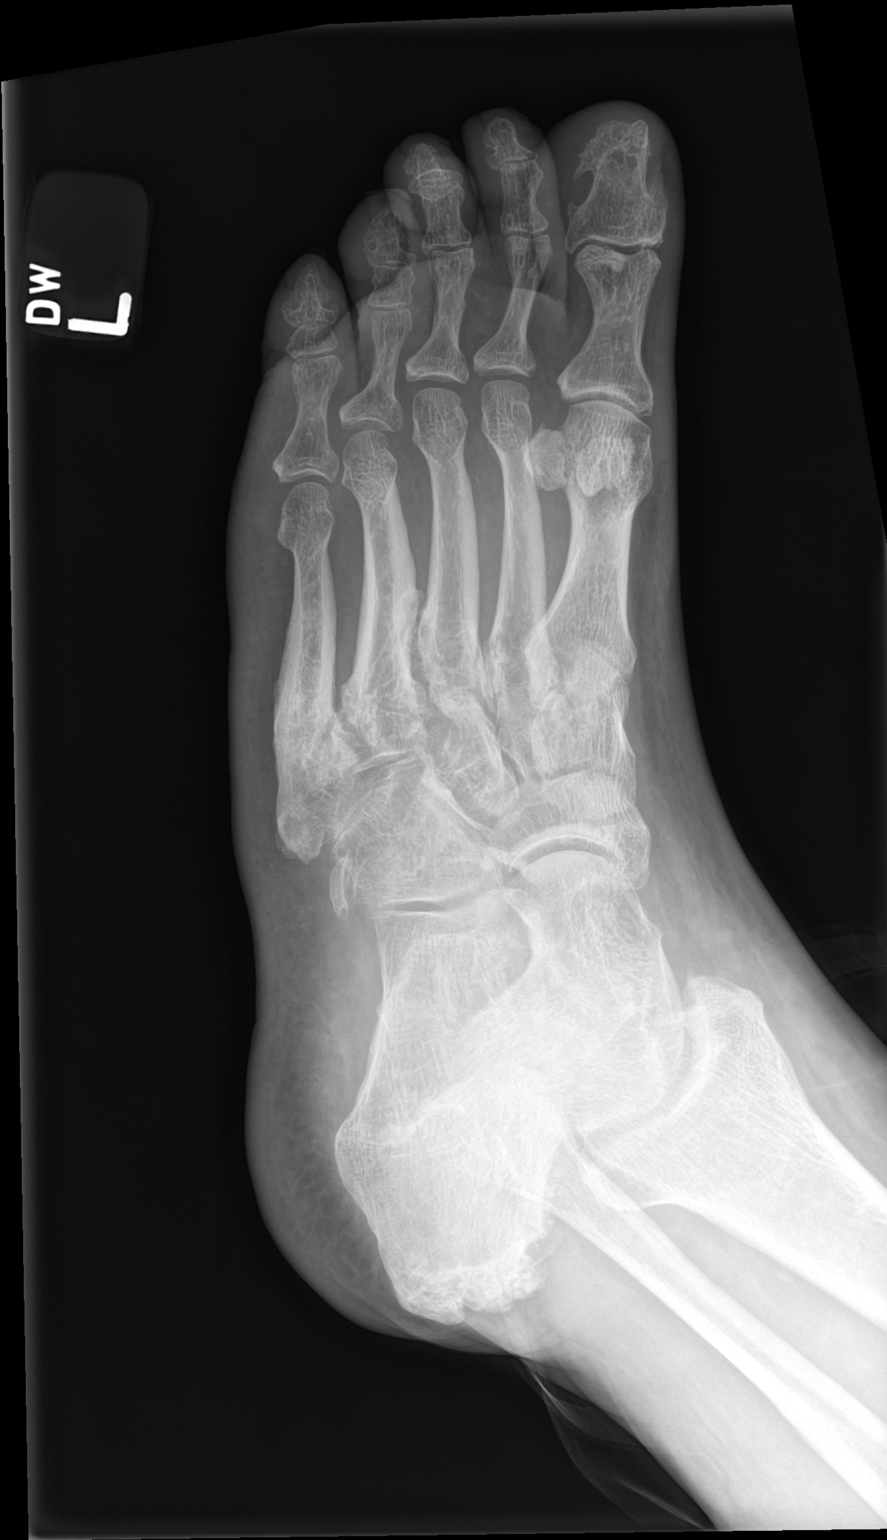

[leg]
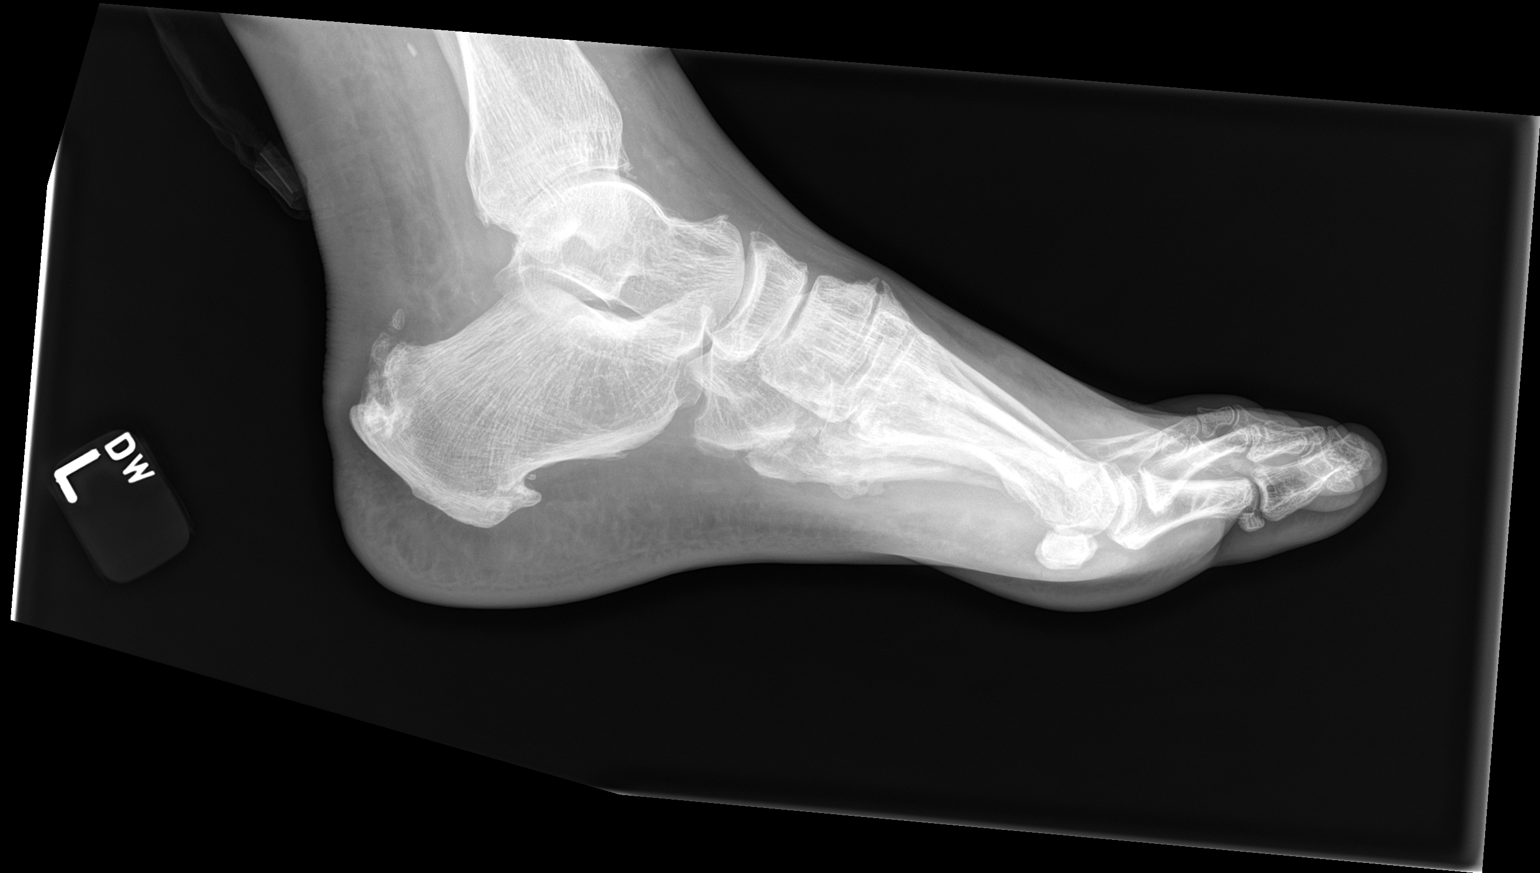

[3 of 3 positions shown; findings below may reference images not displayed]

FINDINGS: Calcaneal spurring and tarsal degenerative changes noted. Mild
distal soft tissue swelling is seen. Definitive bony erosive changes
to suggest osteomyelitis are noted. No fracture or dislocation is
seen. Healed fifth metatarsal fracture is again noted.
IMPRESSION: Chronic changes without acute abnormality.

## 2023-05-04 ENCOUNTER — Encounter: Admitting: Podiatry

## 2023-05-04 ENCOUNTER — Encounter: Payer: Self-pay | Admitting: Podiatry

## 2023-05-04 ENCOUNTER — Ambulatory Visit (INDEPENDENT_AMBULATORY_CARE_PROVIDER_SITE_OTHER): Admitting: Podiatry

## 2023-05-04 VITALS — Ht 72.0 in | Wt 240.3 lb

## 2023-05-04 DIAGNOSIS — M216X1 Other acquired deformities of right foot: Secondary | ICD-10-CM

## 2023-05-04 DIAGNOSIS — Z9889 Other specified postprocedural states: Secondary | ICD-10-CM

## 2023-05-04 DIAGNOSIS — L97512 Non-pressure chronic ulcer of other part of right foot with fat layer exposed: Secondary | ICD-10-CM

## 2023-05-04 NOTE — Progress Notes (Signed)
  Subjective:  Patient ID: Samuel Reed, male    DOB: Apr 07, 1960,  MRN: [id number]  Chief Complaint  Patient presents with   Routine Post Op    Pt is here for post op visit due to ulcer on the bottom of his right foot, states everything is going fine, he believes it is healing well, keeps it clean and wrapped up.    DOS: 03/17/2023 Procedure:  1.  Fifth metatarsal head resection, right foot 2.  Application dermal allograft 3.5 x 1.5 cm, right foot  63 y.o. male seen for post op check.  Previously been treated for dehiscence of surgical site with local wound care.  He reports wound has been continuing to improve and he believes it is healed.  He denies drainage from the area.  Has been using mupirocin ointment and a gauze style adhesive dressing.   Review of Systems: Negative except as noted in the HPI. Denies N/V/F/Ch.   Objective:   There were no vitals filed for this visit.  Body mass index is 32.59 kg/m. Constitutional Well developed. Well nourished.  Vascular Foot warm and well perfused. Capillary refill normal to all digits.   No calf pain with palpation  Neurologic Normal speech. Oriented to person, place, and time. Epicritic sensation diminished to forefoot  Dermatologic Plantar fifth MPJ incision now fully healed no drainage erythema or open wound present.    Orthopedic: Status post fifth metatarsal head resection edema   Radiographs: Status post resection of the distal portion of the fifth metatarsal  Pathology:  A.  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  7 week s/p fifth met  head resection, Kerecis micro graft application and closure of wound -Progressing well  -wound is now fully healed with plantar aspect of the fifth metatarsal head. -No evidence of residual infection or further concern at the surgical site. - Would continue with protective/adhesive bandage over the surgical site. -XR: Deferred today   -WB Status: Weightbearing in regular shoe gear is fine at this point -Medications/ABX: No antibiotics indicated -Okay to wash foot in the shower and dry and reapply bandage -Follow-up in office in 3 week         Corinna Gab, DPM [facility] / [facility]

## 2023-05-13 ENCOUNTER — Ambulatory Visit (INDEPENDENT_AMBULATORY_CARE_PROVIDER_SITE_OTHER): Admitting: Podiatry

## 2023-05-13 ENCOUNTER — Telehealth: Payer: Self-pay | Admitting: Podiatry

## 2023-05-13 DIAGNOSIS — E0843 Diabetes mellitus due to underlying condition with diabetic autonomic (poly)neuropathy: Secondary | ICD-10-CM

## 2023-05-13 DIAGNOSIS — M216X1 Other acquired deformities of right foot: Secondary | ICD-10-CM

## 2023-05-13 DIAGNOSIS — Z9889 Other specified postprocedural states: Secondary | ICD-10-CM

## 2023-05-13 DIAGNOSIS — L97512 Non-pressure chronic ulcer of other part of right foot with fat layer exposed: Secondary | ICD-10-CM

## 2023-05-13 NOTE — Progress Notes (Signed)
 This visit is Subjective:  Patient ID: Samuel Reed, male    DOB: [birth year],  MRN: [id number]  Chief Complaint  Patient presents with   Wound Check    my foot has an open spot and is drainng some fluid    DOS: 03/17/2023 Procedure:  1.  Fifth metatarsal head resection, right foot 2.  Application dermal allograft 3.5 x 1.5 cm, right foot  63 y.o. male seen for post op check.  Previously been treated for dehiscence of surgical site with local wound care.  He reports that he recently noticed some bleeding and increased drainage from the surgical site and that it opened up some.  Denies nausea vomiting fever chills.  Says it has gotten better over the past few days.   Review of Systems: Negative except as noted in the HPI. Denies N/V/F/Ch.   Objective:   There were no vitals filed for this visit.  There is no height or weight on file to calculate BMI. Constitutional Well developed. Well nourished.  Vascular Foot warm and well perfused. Capillary refill normal to all digits.   No calf pain with palpation  Neurologic Normal speech. Oriented to person, place, and time. Epicritic sensation diminished to forefoot  Dermatologic Plantar fifth MPJ incision with no dehiscence superficially to subcutaneous fat tissue layer no erythema or active drainage noted.  No malodor.   Orthopedic: Status post fifth metatarsal head resection edema   Radiographs: Status post resection of the distal portion of the fifth metatarsal  Pathology:  A.  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  8 week s/p fifth met head  resection, Kerecis micro graft application and closure of wound - Progression of wound with superficial dehiscence and superficial ulceration at site that had previously nearly fully healed - Recommend ongoing local wound care with Betadine followed by gentamicin ointment and adhesive dressing or gauze dressing. - Would continue with protective/adhesive bandage over the surgical site. -XR: Deferred today   -WB Status: Weightbearing in postop shoe which was dispensed as he had disposed of his old postop shoe -Medications/ABX: No antibiotics indicated - Recommend keeping foot dry at this time -Follow-up in office in 2 week for wound check        Maridee Shoemaker, DPM [facility] / [facility]

## 2023-05-13 NOTE — Telephone Encounter (Signed)
 The patient called and repots he has noticed an open spot on his foot near his surgical site that is draining some fluid and he would like to be evaluated. I have made him an appointment 05/13/23 and he is aware of the time. Nothing further needed.

## 2023-05-23 ENCOUNTER — Other Ambulatory Visit: Payer: Self-pay | Admitting: Internal Medicine

## 2023-05-25 ENCOUNTER — Encounter: Payer: Self-pay | Admitting: Internal Medicine

## 2023-05-25 ENCOUNTER — Ambulatory Visit (INDEPENDENT_AMBULATORY_CARE_PROVIDER_SITE_OTHER): Payer: 59 | Admitting: Internal Medicine

## 2023-05-25 ENCOUNTER — Ambulatory Visit (INDEPENDENT_AMBULATORY_CARE_PROVIDER_SITE_OTHER): Admitting: Podiatry

## 2023-05-25 ENCOUNTER — Encounter: Payer: Self-pay | Admitting: Podiatry

## 2023-05-25 VITALS — BP 124/60 | HR 60 | Ht 72.0 in | Wt 245.4 lb

## 2023-05-25 DIAGNOSIS — E785 Hyperlipidemia, unspecified: Secondary | ICD-10-CM

## 2023-05-25 DIAGNOSIS — M79676 Pain in unspecified toe(s): Secondary | ICD-10-CM

## 2023-05-25 DIAGNOSIS — Z992 Dependence on renal dialysis: Secondary | ICD-10-CM

## 2023-05-25 DIAGNOSIS — Z9889 Other specified postprocedural states: Secondary | ICD-10-CM

## 2023-05-25 DIAGNOSIS — E1122 Type 2 diabetes mellitus with diabetic chronic kidney disease: Secondary | ICD-10-CM

## 2023-05-25 DIAGNOSIS — N186 End stage renal disease: Secondary | ICD-10-CM | POA: Diagnosis not present

## 2023-05-25 DIAGNOSIS — E66811 Obesity, class 1: Secondary | ICD-10-CM

## 2023-05-25 DIAGNOSIS — Z794 Long term (current) use of insulin: Secondary | ICD-10-CM

## 2023-05-25 DIAGNOSIS — B351 Tinea unguium: Secondary | ICD-10-CM | POA: Diagnosis not present

## 2023-05-25 DIAGNOSIS — N185 Chronic kidney disease, stage 5: Secondary | ICD-10-CM

## 2023-05-25 LAB — POCT GLYCOSYLATED HEMOGLOBIN (HGB A1C): Hemoglobin A1C: 6.2 % — AB (ref 4.0–5.6)

## 2023-05-25 MED ORDER — TIRZEPATIDE 7.5 MG/0.5ML ~~LOC~~ SOAJ: 7.5000 mg | SUBCUTANEOUS | 3 refills | Status: DC

## 2023-05-25 MED ORDER — INSULIN LISPRO (1 UNIT DIAL) 100 UNIT/ML (KWIKPEN)
PEN_INJECTOR | SUBCUTANEOUS | 3 refills | Status: DC
Start: 2023-05-25 — End: 2023-06-18
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
## 2023-05-25 NOTE — Progress Notes (Addendum)
 This visit is Subjective:  Patient ID: Samuel Reed, male    DOB: 09-10-1960,  MRN: [id number]  Chief Complaint  Patient presents with   Routine Post Op    RM21: POV#4: hospital post op/dos 03/17/23/ right met head resection( diabetic ) Getting smaller healing well     DOS: 03/17/2023 Procedure:  1.  Fifth metatarsal head resection, right foot 2.  Application dermal allograft 3.5 x 1.5 cm, right foot  63 y.o. male seen for post op check.  Previously been treated for dehiscence of surgical site with local wound care.   Was nearly fully healed and then had a slight dehiscence of the surgical site plantarly, has been treating with gentamicin  ointment as well as adhesive bandage dressing.   Review of Systems: Negative except as noted in the HPI. Denies N/V/F/Ch.   Objective:   There were no vitals filed for this visit.  There is no height or weight on file to calculate BMI. Constitutional Well developed. Well nourished.  Vascular Foot warm and well perfused. Capillary refill normal to all digits.   No calf pain with palpation  Neurologic Normal speech. Oriented to person, place, and time. Epicritic sensation diminished to forefoot  Dermatologic Plantar fifth MPJ incision with very superficial dehiscence overall healthy granular tissue in wound bed no evidence of infection no malodor no drainage or deep sinus tract    Orthopedic: Status post fifth metatarsal head resection edema   Radiographs: Status post resection of the distal portion of the fifth metatarsal  Pathology:  A.  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  10 week s/p fifth met head resection, Kerecis micro graft application and closure of wound - Overall continues to improve with local wound care, believe some of the dehiscence/drainage was related to the [facility] graft liquefying as it incorporated - Recommend ongoing local wound care with Betadine followed by gentamicin  ointment and adhesive dressing or gauze dressing. - Would continue with protective/adhesive bandage over the surgical site. -XR: Deferred today   -WB Status: Weightbearing in postop shoe  -Medications/ABX: No antibiotics indicated - Recommend keeping foot dry at this time -Follow-up in office in 3 week for wound check        Maridee Shoemaker, DPM [facility] / [facility]    This patient returns to my office for at risk foot care.  This patient requires this care by a professional since this patient will be at risk due to having diabetes,venous stasis and CKD.  This patient is unable to cut nails himself since the patient cannot reach his nails.These nails are painful walking and wearing shoes. He had surgery for infected fifth metatarsal right foot.. This patient presents for at risk foot care today.  General Appearance  Alert, conversant and in no acute stress.  Vascular  Dorsalis pedis and posterior tibial  pulses are palpable  bilaterally.  Capillary return is within normal limits  bilaterally. Temperature is within normal limits  bilaterally.  Neurologic  Senn-Weinstein monofilament wire test diminished  bilaterally. Muscle power within normal limits bilaterally.  Nails Thick disfigured discolored nails with subungual debris  from hallux to fifth toes bilaterally. No evidence  of bacterial infection or drainage bilaterally.  Orthopedic  No limitations of motion  feet .  No crepitus or effusions noted.  No bony pathology or digital deformities noted.  Skin  normotropic skin noted bilaterally.  No signs of infections or ulcers noted.    Asymptomatic sub 5 right.    Onychomycosis  Pain in right toes  Pain in left toes    Consent was obtained for treatment procedures.   Mechanical debridement of nails 1-5  bilaterally performed with a nail nipper.  Filed with dremel without incident.    Return office visit   12  weeks                  Told patient to return for periodic foot care and evaluation due to potential at risk complications.   Ruffin Cotton DPM

## 2023-05-25 NOTE — Patient Instructions (Addendum)
 Please decrease: - Lantus  55 units daily  in the evening - Humalog   10 units before b'fast  10-20 units before lunch or brunch 10-20 units before dinner  Please increase: - Mounjaro 7.5 mg weekly   Please return in 4 months.

## 2023-05-26 ENCOUNTER — Ambulatory Visit: Payer: 59 | Admitting: Podiatry

## 2023-05-26 ENCOUNTER — Encounter: Payer: Self-pay | Admitting: Podiatry

## 2023-05-28 ENCOUNTER — Other Ambulatory Visit (HOSPITAL_COMMUNITY): Payer: Self-pay

## 2023-06-15 ENCOUNTER — Ambulatory Visit (INDEPENDENT_AMBULATORY_CARE_PROVIDER_SITE_OTHER): Admitting: Podiatry

## 2023-06-15 ENCOUNTER — Other Ambulatory Visit: Payer: Self-pay | Admitting: Podiatry

## 2023-06-15 DIAGNOSIS — M216X1 Other acquired deformities of right foot: Secondary | ICD-10-CM

## 2023-06-15 DIAGNOSIS — L97512 Non-pressure chronic ulcer of other part of right foot with fat layer exposed: Secondary | ICD-10-CM

## 2023-06-15 DIAGNOSIS — E1122 Type 2 diabetes mellitus with diabetic chronic kidney disease: Secondary | ICD-10-CM

## 2023-06-15 DIAGNOSIS — E0843 Diabetes mellitus due to underlying condition with diabetic autonomic (poly)neuropathy: Secondary | ICD-10-CM

## 2023-06-15 DIAGNOSIS — Z794 Long term (current) use of insulin: Secondary | ICD-10-CM

## 2023-06-15 NOTE — Progress Notes (Signed)
 This visit is Subjective:  Patient ID: Samuel Reed, male    DOB: Oct 15, 1960,  MRN: [id number]  Chief Complaint  Patient presents with   Follow-up    Denies drainage, denies pain, denies discomfort.     DOS: 03/17/2023 Procedure:  1.  Fifth metatarsal head resection, right foot 2.  Application dermal allograft 3.5 x 1.5 cm, right foot  63 y.o. male seen for post op check.  3 months postop.  Previously been treated for dehiscence of surgical site with local wound care.  He is continuing to do Betadine and gentamicin  dressing changes he denies any drainage from the wound at this point in time.   Review of Systems: Negative except as noted in the HPI. Denies N/V/F/Ch.   Objective:   There were no vitals filed for this visit.  There is no height or weight on file to calculate BMI. Constitutional Well developed. Well nourished.  Vascular Foot warm and well perfused. Capillary refill normal to all digits.   No calf pain with palpation  Neurologic Normal speech. Oriented to person, place, and time. Epicritic sensation diminished to forefoot  Dermatologic Plantar fifth MPJ incision with mild superficial ulceration to the level subcutaneous fat tissue much improved from prior very tiny approximately 0.4 x 0.2 cm.  No active drainage mild hyperkeratotic tissue surrounding margins    Orthopedic: Status post fifth metatarsal head resection edema   Radiographs: Status post resection of the distal portion of the fifth metatarsal  Pathology:  A.  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  3 months s/p fifth  met head resection, Kerecis micro graft application and closure of wound - Progression of wound much improved from prior with local wound care - Recommend ongoing local wound care with Betadine followed by gentamicin  ointment and adhesive dressing or gauze dressing. - Would continue with protective/adhesive bandage over the surgical site. -XR: Deferred today   -WB Status: Okay to transition back to weightbearing in regular shoe -Medications/ABX: No antibiotics indicated - Recommend keeping foot dry at this time -Follow-up in 3 weeks to ensure complete healing of the wound        Maridee Shoemaker, DPM [facility] / [facility]

## 2023-06-17 ENCOUNTER — Other Ambulatory Visit: Payer: Self-pay | Admitting: Internal Medicine

## 2023-07-06 ENCOUNTER — Ambulatory Visit: Admitting: Podiatry

## 2023-07-13 ENCOUNTER — Ambulatory Visit (INDEPENDENT_AMBULATORY_CARE_PROVIDER_SITE_OTHER): Admitting: Podiatry

## 2023-07-13 DIAGNOSIS — L97512 Non-pressure chronic ulcer of other part of right foot with fat layer exposed: Secondary | ICD-10-CM

## 2023-07-13 DIAGNOSIS — M216X1 Other acquired deformities of right foot: Secondary | ICD-10-CM

## 2023-07-13 DIAGNOSIS — E1122 Type 2 diabetes mellitus with diabetic chronic kidney disease: Secondary | ICD-10-CM

## 2023-07-13 DIAGNOSIS — Z794 Long term (current) use of insulin: Secondary | ICD-10-CM

## 2023-07-13 DIAGNOSIS — N185 Chronic kidney disease, stage 5: Secondary | ICD-10-CM | POA: Diagnosis not present

## 2023-07-13 NOTE — Progress Notes (Addendum)
 This visit is Subjective:  Patient ID: Samuel Reed, male    DOB: 20-Jan-1961,  MRN: [id number]  Chief Complaint  Patient presents with   Foot Ulcer    Right foot. Denies pain, denies drainage. Reports no longer needing to do dressing changes or wound care. Wearing regular shoes and WB with no issues.    DOS: 03/17/2023 Procedure:  1.  Fifth metatarsal head resection, right foot 2.  Application dermal allograft 3.5 x 1.5 cm, right foot  63 y.o. male seen for post op check.  4 months postop.  Previously been treated for dehiscence of surgical site with local wound care.  At this point in time patient denies drainage or needing to do any bandaging.  He is wearing regular shoes and weightbearing as tolerated without any pain or issues.   Review of Systems: Negative except as noted in the HPI. Denies N/V/F/Ch.   Objective:   There were no vitals filed for this visit.  There is no height or weight on file to calculate BMI. Constitutional Well developed. Well nourished.  Vascular Foot warm and well perfused. Capillary refill normal to all digits.   No calf pain with palpation  Neurologic Normal speech. Oriented to person, place, and time. Epicritic sensation diminished to forefoot  Dermatologic Plantar fifth MPJ incision is now fully healed no open wound or evidence of residual infection, no residual hyper keratotic lesion forming    Orthopedic: Status post fifth metatarsal head resection edema   Radiographs: Status post resection of the distal portion of the fifth metatarsal  Pathology:  A.  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  4 months s/p fifth met head resection, Kerecis micro graft application and closure of wound - Fully healed at this time no further wound care or dressings needed, patient is stable for addition to transplant list for kidney no contraindication to receiving the kidney in regards to the foot at this time there is no evidence of infection or wound on either foot. -XR: Deferred today   -WB Status: Weightbearing in regular shoes -Medications/ABX: No antibiotics indicated - Okay to wash right foot as desired -Follow-up  as needed        Maridee Shoemaker, DPM [facility] / [facility]

## 2023-07-16 ENCOUNTER — Other Ambulatory Visit: Payer: Self-pay | Admitting: Internal Medicine

## 2023-07-16 DIAGNOSIS — E1165 Type 2 diabetes mellitus with hyperglycemia: Secondary | ICD-10-CM

## 2023-07-27 LAB — HM DIABETES EYE EXAM

## 2023-08-13 ENCOUNTER — Telehealth: Payer: Self-pay

## 2023-08-13 NOTE — Telephone Encounter (Signed)
 Patient called and left a vm stating that he is having some issues with the Mounjaro  7.5 mg. He wants to know can he go back down to 5 mg   SEs from 7.5 mg, abdominal pain, severe constipation

## 2023-08-16 MED ORDER — TIRZEPATIDE 5 MG/0.5ML ~~LOC~~ SOAJ: 5.0000 mg | SUBCUTANEOUS | 3 refills | Status: DC

## 2023-09-21 ENCOUNTER — Other Ambulatory Visit: Payer: Self-pay | Admitting: Cardiovascular Disease

## 2023-09-21 ENCOUNTER — Encounter: Payer: Self-pay | Admitting: Cardiovascular Disease

## 2023-09-21 ENCOUNTER — Ambulatory Visit: Attending: Cardiovascular Disease | Admitting: Cardiovascular Disease

## 2023-09-21 ENCOUNTER — Encounter: Payer: Self-pay | Admitting: Internal Medicine

## 2023-09-21 ENCOUNTER — Ambulatory Visit (INDEPENDENT_AMBULATORY_CARE_PROVIDER_SITE_OTHER): Admitting: Internal Medicine

## 2023-09-21 VITALS — BP 120/70 | HR 69 | Ht 72.0 in | Wt 248.6 lb

## 2023-09-21 VITALS — BP 110/72 | HR 70 | Ht 72.0 in | Wt 250.3 lb

## 2023-09-21 DIAGNOSIS — E1165 Type 2 diabetes mellitus with hyperglycemia: Secondary | ICD-10-CM | POA: Diagnosis not present

## 2023-09-21 DIAGNOSIS — E782 Mixed hyperlipidemia: Secondary | ICD-10-CM | POA: Diagnosis not present

## 2023-09-21 DIAGNOSIS — I5032 Chronic diastolic (congestive) heart failure: Secondary | ICD-10-CM

## 2023-09-21 DIAGNOSIS — E1142 Type 2 diabetes mellitus with diabetic polyneuropathy: Secondary | ICD-10-CM

## 2023-09-21 DIAGNOSIS — I10 Essential (primary) hypertension: Secondary | ICD-10-CM | POA: Diagnosis not present

## 2023-09-21 DIAGNOSIS — G4733 Obstructive sleep apnea (adult) (pediatric): Secondary | ICD-10-CM | POA: Diagnosis not present

## 2023-09-21 DIAGNOSIS — E66811 Obesity, class 1: Secondary | ICD-10-CM | POA: Diagnosis not present

## 2023-09-21 DIAGNOSIS — E785 Hyperlipidemia, unspecified: Secondary | ICD-10-CM | POA: Diagnosis not present

## 2023-09-21 DIAGNOSIS — Z7985 Long-term (current) use of injectable non-insulin antidiabetic drugs: Secondary | ICD-10-CM

## 2023-09-21 DIAGNOSIS — N186 End stage renal disease: Secondary | ICD-10-CM

## 2023-09-21 DIAGNOSIS — I4510 Unspecified right bundle-branch block: Secondary | ICD-10-CM | POA: Insufficient documentation

## 2023-09-21 LAB — CBC

## 2023-09-21 MED ORDER — TIRZEPATIDE 7.5 MG/0.5ML ~~LOC~~ SOAJ
7.5000 mg | SUBCUTANEOUS | 3 refills | Status: AC
Start: 2023-09-21 — End: ?

## 2023-09-21 MED ORDER — LANTUS SOLOSTAR 100 UNIT/ML ~~LOC~~ SOPN: 65.0000 [IU] | PEN_INJECTOR | Freq: Every day | SUBCUTANEOUS | Status: AC

## 2023-09-21 NOTE — Assessment & Plan Note (Signed)
 History of hyperlipidemia on statin therapy with lipid profile performed 01/06/2021 revealed a total cholesterol 105, LDL 36 and HDL 32.  Apparently his endocrinologist follows this closely as an outpatient.
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
## 2023-09-21 NOTE — Assessment & Plan Note (Signed)
 History of grade 3 diastolic dysfunction by 2D echo in 2023 on peritoneal dialysis and diuretics.

## 2023-09-21 NOTE — Assessment & Plan Note (Signed)
 Chronic

## 2023-09-21 NOTE — Patient Instructions (Signed)
 Medication Instructions:  Your physician recommends that you continue on your current medications as directed. Please refer to the Current Medication list given to you today.  *If you need a refill on your cardiac medications before your next appointment, please call your pharmacy*  Lab Work: Your physician recommends that you have labs drawn today: BMET & CBC  If you have labs (blood work) drawn today and your tests are completely normal, you will receive your results only by: MyChart Message (if you have MyChart) OR A paper copy in the mail If you have any lab test that is abnormal or we need to change your treatment, we will call you to review the results.  Testing/Procedures: See below  Follow-Up: At [facility], you and your health needs are our priority.  As part of our continuing mission to provide you with exceptional heart care, our providers are all part of one team.  This team includes your primary Cardiologist (physician) and Advanced Practice Providers or APPs (Physician Assistants and Nurse Practitioners) who all work together to provide you with the care you need, when you need it.  Your next appointment:   2-3 weeks   Provider:   Any APP for post cath visit    We recommend signing up for the patient portal called MyChart.  Sign up information is provided on this After Visit Summary.  MyChart is used to connect with patients for Virtual Visits (Telemedicine).  Patients are able to view lab/test results, encounter notes, upcoming appointments, etc.  Non-urgent messages can be sent to your provider as well.   To learn more about what you can do with MyChart, go to [email or web address].   Other Instructions       Cardiac/Peripheral Catheterization   You are scheduled for a Cardiac Catheterization on Thursday, August 28 with Dr. Ozell Fell.  1. Please arrive at the [facility] (Main Entrance A) at [facility]: [address]  [place], [place] [address] at 10:30 AM (This time is 2 hour(s) before your procedure to ensure your preparation).   Free valet parking service is available. You will check in at ADMITTING. The support person will be asked to wait in the waiting room.  It is OK to have someone drop you off and come back when you are ready to be discharged.        Special note: Every effort is made to have your procedure done on time. Please understand that emergencies sometimes delay scheduled procedures.  2. Diet: No solid foods after midnight. You may have clear liquids until you arrive at the hospital.  List of approved liquids water , clear juice, clear tea, black coffee, fruit juices, non-citric and without pulp, carbonated beverages, Gatorade, Kool -Aid, plain Jello-O and plain ice popsicles.  3. Hydration:On August 28, you may drink approved liquids (see below) until 2 hours before the procedure time.       List of approved liquids water , clear juice, clear tea, black coffee, fruit juices, non-citric and without pulp, carbonated beverages, Gatorade, Kool -Aid, plain Jello-O and plain ice popsicles.  4. Labs: You will need to have blood drawn today (8/26)  5. Medication instructions in preparation for your procedure:   Stop taking, Torsemide  (Demadex ) and Spironolactone Thursday, August 28,  Take only 1/2 units of insulin  the night before your procedure. Do not take any insulin  on the day of the procedure.  Hold Mounjaro  until after your heart cath  On the morning of your procedure, take  Aspirin  81 mg and any morning medicines NOT listed above.  You may use sips of water .  6. Plan to go home the same day, you will only stay overnight if medically necessary. 7. You MUST have a responsible adult to drive you home. 8. An adult MUST be with you the first 24 hours after you arrive home. 9. Bring a current list of your medications, and the last time and date medication taken. 10. Bring ID and current insurance  cards. 11.Please wear clothes that are easy to get on and off and wear slip-on shoes.  Thank you for allowing us  to care for you!   -- [facility]

## 2023-09-21 NOTE — Patient Instructions (Addendum)
 Please continue: - Lantus  65 units daily in the evening - Humalog   10 units before b'fast  10-20 units before lunch or brunch 10-20 units before dinner  Try again to increase: - Mounjaro  7.5 mg weekly   Please return in 4 months.

## 2023-09-21 NOTE — Assessment & Plan Note (Signed)
 History of essential hypertension with blood pressure measured today at 110/72.  He is on amlodipine , losartan and metoprolol .
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
## 2023-09-21 NOTE — Assessment & Plan Note (Signed)
 History of obstructive sleep apnea on CPAP.

## 2023-09-22 ENCOUNTER — Other Ambulatory Visit (HOSPITAL_COMMUNITY): Payer: Self-pay

## 2023-09-22 ENCOUNTER — Ambulatory Visit (INDEPENDENT_AMBULATORY_CARE_PROVIDER_SITE_OTHER): Admitting: Podiatry

## 2023-09-22 ENCOUNTER — Telehealth: Payer: Self-pay

## 2023-09-22 ENCOUNTER — Encounter: Payer: Self-pay | Admitting: Podiatry

## 2023-09-22 ENCOUNTER — Telehealth: Payer: Self-pay | Admitting: *Deleted

## 2023-09-22 ENCOUNTER — Ambulatory Visit: Payer: Self-pay | Admitting: Cardiovascular Disease

## 2023-09-22 DIAGNOSIS — M216X1 Other acquired deformities of right foot: Secondary | ICD-10-CM

## 2023-09-22 DIAGNOSIS — B351 Tinea unguium: Secondary | ICD-10-CM

## 2023-09-22 DIAGNOSIS — E0843 Diabetes mellitus due to underlying condition with diabetic autonomic (poly)neuropathy: Secondary | ICD-10-CM

## 2023-09-22 DIAGNOSIS — M79676 Pain in unspecified toe(s): Secondary | ICD-10-CM | POA: Diagnosis not present

## 2023-09-22 LAB — BASIC METABOLIC PANEL WITH GFR
BUN/Creatinine Ratio: 7 — ABNORMAL LOW (ref 10–24)
BUN: 67 mg/dL — ABNORMAL HIGH (ref 8–27)
CO2: 19 mmol/L — ABNORMAL LOW (ref 20–29)
Calcium: 8.8 mg/dL (ref 8.6–10.2)
Chloride: 101 mmol/L (ref 96–106)
Creatinine, Ser: 9.11 mg/dL — ABNORMAL HIGH (ref 0.76–1.27)
Glucose: 97 mg/dL (ref 70–99)
Potassium: 4.2 mmol/L (ref 3.5–5.2)
Sodium: 144 mmol/L (ref 134–144)
eGFR: 6 mL/min/1.73 — ABNORMAL LOW (ref >59)

## 2023-09-22 LAB — CBC
Hematocrit: 39 (ref 37.5–51.0)
Hemoglobin: 12.5 g/dL — AB (ref 13.0–17.7)
MCH: 29.6 pg (ref 26.6–33.0)
MCHC: 32.1 g/dL (ref 31.5–35.7)
MCV: 92 fL (ref 79–97)
Platelets: 225 x10E3/uL (ref 150–450)
RBC: 4.23 x10E6/uL (ref 4.14–5.80)
RDW: 14.1 (ref 11.6–15.4)
WBC: 8.1 x10E3/uL (ref 3.4–10.8)

## 2023-09-22 NOTE — Telephone Encounter (Signed)
 Pharmacy Patient Advocate Encounter   Received notification from CoverMyMeds that prior authorization for FreeStyle Libre 3 Plus Sensor is required/requested.   Insurance verification completed.   The patient is insured through [facility] .   Per test claim: The current 30 day co-pay is, $0.  No PA needed at this time. This test claim was processed through [facility]- copay amounts may vary at other pharmacies due to pharmacy/plan contracts, or as the patient moves through the different stages of their insurance plan.

## 2023-09-22 NOTE — Telephone Encounter (Addendum)
 Cardiac Catheterization scheduled at [facility] for: Thursday September 23, 2023 12:30 PM Arrival time [facility] Main Entrance A at: 10:30 AM  Diet: -May have light meal until 6:30 AM. (6 hours before procedure time) Approved light meal consists of plain toast, fruit, light soups, crackers.  Hydration: -May drink clear liquids until 2 hours before the procedure.  Approved liquids: Water , clear tea, black coffee, fruit juices-non-citric and without pulp,Gatorade, plain Jello/popsicles.  No PO hydration (bottle of water ) OTW to hospital-peritoneal dialysis  Medication instructions: -Hold:  Insulin -AM of procedure/1/2 usual dose HS prior to procedure  Spironolactone/Torsemide -AM of procedure   Mounjaro -weekly on Mondays -Other usual morning medications can be taken including aspirin  81 mg.  Plan to go home the same day, you will only stay overnight if medically necessary.  You must have responsible adult to drive you home.  Someone must be with you the first 24 hours after you arrive home.  Reviewed procedure instructions with patient.  Patient tells me he does Peritoneal Dialysis 7 times/week.

## 2023-09-22 NOTE — Progress Notes (Signed)
 This patient returns to my office for at risk foot care.  This patient requires this care by a professional since this patient will be at risk due to having diabetes,venous stasis and CKD.  This patient is unable to cut nails himself since the patient cannot reach his nails.These nails are painful walking and wearing shoes. He had surgery for correction left forefoot callus. This patient presents for at risk foot care today.  General Appearance  Alert, conversant and in no acute stress.  Vascular  Dorsalis pedis and posterior tibial  pulses are palpable  bilaterally.  Capillary return is within normal limits  bilaterally. Temperature is within normal limits  bilaterally.  Neurologic  Senn-Weinstein monofilament wire test diminished  bilaterally. Muscle power within normal limits bilaterally.  Nails Thick disfigured discolored nails with subungual debris  from hallux to fifth toes bilaterally. No evidence of bacterial infection or drainage bilaterally.  Orthopedic  No limitations of motion  feet .  No crepitus or effusions noted.  No bony pathology or digital deformities noted.  Skin  normotropic skin noted bilaterally.  No signs of infections or ulcers noted.      Onychomycosis  Pain in right toes  Pain in left toes    Consent was obtained for treatment procedures.   Mechanical debridement of nails 1-5  bilaterally performed with a nail nipper.  Filed with dremel without incident.    Return office visit   12  weeks                  Told patient to return for periodic foot care and evaluation due to potential at risk complications.   Cordella Bold DPM

## 2023-09-23 ENCOUNTER — Ambulatory Visit (HOSPITAL_COMMUNITY)
Admission: RE | Admit: 2023-09-23 | Discharge: 2023-09-23 | Disposition: A | Discharge status: Home / Self Care | Attending: Cardiovascular Disease | Admitting: Cardiovascular Disease

## 2023-09-23 ENCOUNTER — Other Ambulatory Visit: Payer: Self-pay

## 2023-09-23 ENCOUNTER — Encounter (HOSPITAL_COMMUNITY)
Admission: RE | Disposition: A | Discharge status: Home / Self Care | Source: Home / Self Care | Attending: Cardiovascular Disease

## 2023-09-23 DIAGNOSIS — Z7985 Long-term (current) use of injectable non-insulin antidiabetic drugs: Secondary | ICD-10-CM | POA: Diagnosis not present

## 2023-09-23 DIAGNOSIS — Z992 Dependence on renal dialysis: Secondary | ICD-10-CM | POA: Diagnosis not present

## 2023-09-23 DIAGNOSIS — I132 Hypertensive heart and chronic kidney disease with heart failure and with stage 5 chronic kidney disease, or end stage renal disease: Secondary | ICD-10-CM | POA: Diagnosis not present

## 2023-09-23 DIAGNOSIS — I2510 Atherosclerotic heart disease of native coronary artery without angina pectoris: Secondary | ICD-10-CM | POA: Insufficient documentation

## 2023-09-23 DIAGNOSIS — Z87891 Personal history of nicotine dependence: Secondary | ICD-10-CM | POA: Diagnosis not present

## 2023-09-23 DIAGNOSIS — E1122 Type 2 diabetes mellitus with diabetic chronic kidney disease: Secondary | ICD-10-CM | POA: Insufficient documentation

## 2023-09-23 DIAGNOSIS — Z79899 Other long term (current) drug therapy: Secondary | ICD-10-CM | POA: Diagnosis not present

## 2023-09-23 DIAGNOSIS — N186 End stage renal disease: Secondary | ICD-10-CM | POA: Insufficient documentation

## 2023-09-23 DIAGNOSIS — E785 Hyperlipidemia, unspecified: Secondary | ICD-10-CM | POA: Diagnosis not present

## 2023-09-23 DIAGNOSIS — Z794 Long term (current) use of insulin: Secondary | ICD-10-CM | POA: Insufficient documentation

## 2023-09-23 DIAGNOSIS — G4733 Obstructive sleep apnea (adult) (pediatric): Secondary | ICD-10-CM | POA: Insufficient documentation

## 2023-09-23 DIAGNOSIS — I5032 Chronic diastolic (congestive) heart failure: Secondary | ICD-10-CM | POA: Diagnosis not present

## 2023-09-23 HISTORY — PX: LEFT HEART CATH AND CORONARY ANGIOGRAPHY: CATH118249

## 2023-09-23 LAB — GLUCOSE, CAPILLARY: Glucose-Capillary: 161 mg/dL — ABNORMAL HIGH (ref 70–99)

## 2023-09-23 SURGERY — LEFT HEART CATH AND CORONARY ANGIOGRAPHY
Anesthesia: LOCAL

## 2023-09-23 MED ORDER — SODIUM CHLORIDE 0.9% FLUSH: 3.0000 mL | INTRAVENOUS | Status: DC | PRN

## 2023-09-23 MED ORDER — FREE WATER: 250.0000 mL | Freq: Once | Status: DC

## 2023-09-23 MED ORDER — ONDANSETRON HCL 4 MG/2ML IJ SOLN: 4.0000 mg | Freq: Four times a day (QID) | INTRAMUSCULAR | Status: DC | PRN

## 2023-09-23 MED ORDER — FENTANYL CITRATE (PF) 100 MCG/2ML IJ SOLN
INTRAMUSCULAR | Status: DC | PRN
  Administered 2023-09-23: 25 ug via INTRAVENOUS

## 2023-09-23 MED ORDER — SODIUM CHLORIDE 0.9 % IV SOLN
250.0000 mL | INTRAVENOUS | Status: DC | PRN
Start: 2023-09-23 — End: 2023-09-23

## 2023-09-23 MED ORDER — MIDAZOLAM HCL 2 MG/2ML IJ SOLN
INTRAMUSCULAR | Status: DC | PRN
  Administered 2023-09-23: 1 mg via INTRAVENOUS

## 2023-09-23 MED ORDER — LIDOCAINE HCL (PF) 1 % IJ SOLN
INTRAMUSCULAR | Status: DC | PRN
  Administered 2023-09-23: 5 mL via INTRADERMAL

## 2023-09-23 MED ORDER — MIDAZOLAM HCL 2 MG/2ML IJ SOLN
INTRAMUSCULAR | Status: AC
Start: 2023-09-23 — End: 2023-09-23
  Filled 2023-09-23: qty 2

## 2023-09-23 MED ORDER — IOHEXOL 350 MG/ML SOLN
INTRAVENOUS | Status: DC | PRN
  Administered 2023-09-23: 62 mL

## 2023-09-23 MED ORDER — ACETAMINOPHEN 325 MG PO TABS: 650.0000 mg | ORAL_TABLET | ORAL | Status: DC | PRN

## 2023-09-23 MED ORDER — HYDRALAZINE HCL 20 MG/ML IJ SOLN: 10.0000 mg | INTRAMUSCULAR | Status: DC | PRN

## 2023-09-23 MED ORDER — SODIUM CHLORIDE 0.9% FLUSH: 3.0000 mL | Freq: Two times a day (BID) | INTRAVENOUS | Status: DC

## 2023-09-23 MED ORDER — SODIUM CHLORIDE 0.9 % IV SOLN: 250.0000 mL | INTRAVENOUS | Status: DC | PRN

## 2023-09-23 MED ORDER — LABETALOL HCL 5 MG/ML IV SOLN: 10.0000 mg | INTRAVENOUS | Status: DC | PRN

## 2023-09-23 MED ORDER — FENTANYL CITRATE (PF) 100 MCG/2ML IJ SOLN
INTRAMUSCULAR | Status: AC
  Filled 2023-09-23: qty 2

## 2023-09-23 MED ORDER — HEPARIN (PORCINE) IN NACL 1000-0.9 UT/500ML-% IV SOLN
INTRAVENOUS | Status: DC | PRN
  Administered 2023-09-23 (×2): 500 mL

## 2023-09-23 MED ORDER — FREE WATER: 500.0000 mL | Freq: Once | Status: DC

## 2023-09-23 MED ORDER — LIDOCAINE HCL (PF) 1 % IJ SOLN
INTRAMUSCULAR | Status: AC
  Filled 2023-09-23: qty 30

## 2023-09-23 SURGICAL SUPPLY — 9 items
CATH INFINITI 5FR MULTPACK ANG (CATHETERS) IMPLANT
CLOSURE MYNX CONTROL 5F (Vascular Products) IMPLANT
GUIDEWIRE INQWIRE 1.5J.035X260 (WIRE) IMPLANT
KIT MICROPUNCTURE NIT STIFF (SHEATH) IMPLANT
PACK CARDIAC CATHETERIZATION (CUSTOM PROCEDURE TRAY) ×2 IMPLANT
SET ATX-X65L (MISCELLANEOUS) IMPLANT
SHEATH PINNACLE 5F 10CM (SHEATH) IMPLANT
SHEATH PROBE COVER 6X72 (BAG) IMPLANT
WIRE EMERALD 3MM-J .035X150CM (WIRE) IMPLANT

## 2023-09-23 NOTE — Discharge Instructions (Signed)
 Femoral Site Care This sheet gives you information about how to care for yourself after your procedure. Your health care provider may also give you more specific instructions. If you have problems or questions, contact your health care provider. What can I expect after the procedure?  After the procedure, it is common to have: Bruising that usually fades within 1-2 weeks. Tenderness at the site. Follow these instructions at home: Wound care Follow instructions from your health care provider about how to take care of your insertion site. Make sure you: Wash your hands with soap and water  before you change your bandage (dressing). If soap and water  are not available, use hand sanitizer. Remove your dressing as told by your health care provider. 24 hours Do not take baths, swim, or use a hot tub until your health care provider approves. You may shower 24-48 hours after the procedure or as told by your health care provider. Gently wash the site with plain soap and water . Pat the area dry with a clean towel. Do not rub the site. This may cause bleeding. Do not apply powder or lotion to the site. Keep the site clean and dry. Check your femoral site every day for signs of infection. Check for: Redness, swelling, or pain. Fluid or blood. Warmth. Pus or a bad smell. Activity For the first 2-3 days after your procedure, or as long as directed: Avoid climbing stairs as much as possible. Do not squat. Do not lift anything that is heavier than 10 lb (4.5 kg), or the limit that you are told, until your health care provider says that it is safe. For 5 days Rest as directed. Avoid sitting for a long time without moving. Get up to take short walks every 1-2 hours. Do not drive for 24 hours if you were given a medicine to help you relax (sedative). General instructions Take over-the-counter and prescription medicines only as told by your health care provider. Keep all follow-up visits as told by your  health care provider. This is important. Contact a health care provider if you have: A fever or chills. You have redness, swelling, or pain around your insertion site. Get help right away if: The catheter insertion area swells very fast. You pass out. You suddenly start to sweat or your skin gets clammy. The catheter insertion area is bleeding, and the bleeding does not stop when you hold steady pressure on the area. The area near or just beyond the catheter insertion site becomes pale, cool, tingly, or numb. These symptoms may represent a serious problem that is an emergency. Do not wait to see if the symptoms will go away. Get medical help right away. Call your local emergency services (911 in the [place] Do not drive yourself to the hospital. Summary After the procedure, it is common to have bruising that usually fades within 1-2 weeks. Check your femoral site every day for signs of infection. Do not lift anything that is heavier than 10 lb (4.5 kg), or the limit that you are told, until your health care provider says that it is safe. This information is not intended to replace advice given to you by your health care provider. Make sure you discuss any questions you have with your health care provider. Document Revised: 01/25/2017 Document Reviewed: 01/25/2017 Elsevier Patient Education  2020 [employer].

## 2023-09-23 NOTE — Progress Notes (Signed)
 Patient ambulated to the bathroom. Patient was able to void before discharge.  No hematoma noted. Dressing clean, dry and intact.

## 2023-09-23 NOTE — Interval H&P Note (Signed)
 History and Physical Interval Note:  09/23/2023 3:27 PM  Samuel Reed  has presented today for surgery, with the diagnosis of instage renal.  The various methods of treatment have been discussed with the patient and family. After consideration of risks, benefits and other options for treatment, the patient has consented to  Procedure(s): LEFT HEART CATH AND CORONARY ANGIOGRAPHY (N/A) as a surgical intervention.  The patient's history has been reviewed, patient examined, no change in status, stable for surgery.  I have reviewed the patient's chart and labs.  Questions were answered to the patient's satisfaction.     Ozell Fell

## 2023-09-24 ENCOUNTER — Encounter (HOSPITAL_COMMUNITY): Payer: Self-pay | Admitting: Cardiovascular Disease

## 2023-09-28 ENCOUNTER — Telehealth: Payer: Self-pay

## 2023-09-28 NOTE — Telephone Encounter (Signed)
 Spoke with Crystal at transplant center regarding results from pt's recent heart cath. Crystal states that images for transplant are not necessary, they are requesting a print out of results from heart cath to be faxed. Fax number confirmed [phone]). Results faxed.

## 2023-10-13 ENCOUNTER — Encounter (HOSPITAL_BASED_OUTPATIENT_CLINIC_OR_DEPARTMENT_OTHER): Payer: Self-pay

## 2023-10-13 NOTE — Progress Notes (Unsigned)
 Cardiology Office Note:  .   Date:  10/14/2023  ID:  LYMON KIDNEY, DOB Mar 18, 1960, MRN [id number] PCP: Jimmy Charlie FERNS, MD  [facility] Providers Cardiologist:  Dorn Lesches, MD {  History of Present Illness: .   Samuel Reed is a 63 y.o. male with history of pericardial effusion with tamponade status post pericardial window 12/2018, nonobstructive CAD by heart catheterization 08/2023, hypertension, diabetes, hyperlipidemia, OSA on CPAP, ESRD     Nonobstructive CAD LHC 01/2015 normal Pending transplant. Preprocedure LHC 09/2023 mild nonobstructive CAD 30% mid LAD stenosis  Pericardial effusion Noted 12/2018.  Severe concentric LVH, large circumferential pericardial effusion with features of tamponade.  Asymptomatic.  Eventually underwent pericardiectomy.  Pathology nonmalignant and no growth.  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  He has been getting worked up for renal transplant.  Last seen 08/2023 and doing very well.  Just underwent preoperative left heart catheterization 09/2023 with mild nonobstructive CAD.    Today patient presents for post cardiac catheterization follow-up.  Reports no pain/tenderness around the site of groin access.  He is active and walks approximately 1 mile daily with no exertional symptoms.  Blood pressure has been well-controlled at home.  Sometimes he does get orthostatic but only when his blood pressure drops below 100 but does not happen frequently and has not fallen or passed out in over 2 years.  Reports he is still producing urine and does peritoneal dialysis at home.  ROS: Denies: Chest pain, shortness of breath, orthopnea, peripheral edema, palpitations, decreased exercise intolerance, fatigue, lightheadedness.   Studies Reviewed: .          Risk Assessment/Calculations:             Physical Exam:   VS:  BP 124/66   Pulse 65   Ht 6' (1.829 m)   Wt 249 lb (112.9 kg)   SpO2 99%   BMI 33.77 kg/m    Wt Readings from Last 3 Encounters:  10/14/23 249 lb (112.9 kg)  09/23/23 245 lb (111.1 kg)  09/21/23 248 lb 9.6 oz (112.8 kg)    GEN: Well nourished, well developed in no acute distress NECK: No JVD; No carotid bruits CARDIAC: RRR, no murmurs, rubs, gallops RESPIRATORY:  Clear to auscultation without rales, wheezing or rhonchi  ABDOMEN: Soft, non-tender, non-distended EXTREMITIES:  No edema; No deformity   ASSESSMENT AND PLAN: .    Nonobstructive CAD Hyperlipidemia On renal transplant list.  Preprocedure left heart catheterization with nonobstructive disease with 30% mid LAD stenosis.  He is completely asymptomatic with good functional capacity. Reports nephrology stopped aspirin .  He is on rosuvastatin  20 mg daily which his endocrinologist manages.  Consider switching to atorvastatin given underlying renal disease.  Pericardial effusion Status post pericardial window 12/2018.  Pathology not compelling, he was stage IV kidney disease during that time which may have been the culprit.  Diastolic dysfunction Echocardiogram 02/2021 with preserved biventricular function.  Grade 3 diastolic dysfunction.  Severely dilated LA.  No significant valvular disease.  Asymptomatic with no issues of volume. Continue torsemide  100 mg daily.  ESRD On the transplant list at [facility] [place] in [place].  Hypertension Well-controlled 124/66 today. He is on amlodipine  10 mg, losartan 100 mg, Lopressor  100 mg twice daily, rosuvastatin  25 milligrams.  Nephrologist manages this.  OSA on CPAP Compliant  Diabetes Followed by endocrinology and on insulin .  Well-controlled A1c 6.2% April 2025.     Dispo: 1 year follow-up with Dr. Court   Signed, Thom LITTIE Sluder, PA-C

## 2023-10-14 ENCOUNTER — Ambulatory Visit: Attending: Physician Assistant | Admitting: Cardiology

## 2023-10-14 ENCOUNTER — Encounter: Payer: Self-pay | Admitting: Physician Assistant

## 2023-10-14 VITALS — BP 124/66 | HR 65 | Ht 72.0 in | Wt 249.0 lb

## 2023-10-14 DIAGNOSIS — Z9889 Other specified postprocedural states: Secondary | ICD-10-CM | POA: Diagnosis present

## 2023-10-14 DIAGNOSIS — N186 End stage renal disease: Secondary | ICD-10-CM | POA: Insufficient documentation

## 2023-10-14 DIAGNOSIS — I2510 Atherosclerotic heart disease of native coronary artery without angina pectoris: Secondary | ICD-10-CM | POA: Diagnosis present

## 2023-10-14 DIAGNOSIS — E782 Mixed hyperlipidemia: Secondary | ICD-10-CM | POA: Diagnosis present

## 2023-10-14 DIAGNOSIS — G4733 Obstructive sleep apnea (adult) (pediatric): Secondary | ICD-10-CM | POA: Diagnosis present

## 2023-10-14 NOTE — Patient Instructions (Signed)
 Medication Instructions:  Your physician recommends that you continue on your current medications as directed. Please refer to the Current Medication list given to you today.  *If you need a refill on your cardiac medications before your next appointment, please call your pharmacy*  Lab Work: None If you have labs (blood work) drawn today and your tests are completely normal, you will receive your results only by: MyChart Message (if you have MyChart) OR A paper copy in the mail If you have any lab test that is abnormal or we need to change your treatment, we will call you to review the results.  Testing/Procedures: None  Follow-Up: At [facility], you and your health needs are our priority.  As part of our continuing mission to provide you with exceptional heart care, our providers are all part of one team.  This team includes your primary Cardiologist (physician) and Advanced Practice Providers or APPs (Physician Assistants and Nurse Practitioners) who all work together to provide you with the care you need, when you need it.  Your next appointment:   1 year(s)  Provider:   Dorn Lesches, MD    We recommend signing up for the patient portal called MyChart.  Sign up information is provided on this After Visit Summary.  MyChart is used to connect with patients for Virtual Visits (Telemedicine).  Patients are able to view lab/test results, encounter notes, upcoming appointments, etc.  Non-urgent messages can be sent to your provider as well.   To learn more about what you can do with MyChart, go to [email or web address].   Other Instructions

## 2023-10-27 ENCOUNTER — Telehealth: Payer: Self-pay

## 2023-10-27 NOTE — Telephone Encounter (Signed)
 Copied from CRM [id number]. Topic: General - Other >> Oct 27, 2023  9:42 AM Chiquita SQUIBB wrote: Reason for CRM: [facility] is calling in stating that they have faxed a request for a c pap machine for the patient but have not received a response. They are going to try re faxing it. A good call back number is [phone]

## 2023-10-28 NOTE — Telephone Encounter (Signed)
 Copied from CRM [id number]. Topic: General - Other >> Oct 27, 2023  9:42 AM Chiquita SQUIBB wrote: Reason for CRM: [facility] is calling in stating that they have faxed a request for a c pap machine for the patient but have not received a response. They are going to try re faxing it. A good call back number is [phone] >> Oct 28, 2023  4:02 PM Rosina BIRCH wrote: Tyra from [facility] called stating she is requesting a medical prescription for the cpap machine  [phone]

## 2023-11-08 ENCOUNTER — Other Ambulatory Visit: Payer: Self-pay | Admitting: Internal Medicine

## 2023-11-10 MED ORDER — FREESTYLE LIBRE 3 PLUS SENSOR MISC: 1.0000 | 12 refills | Status: AC

## 2023-11-10 NOTE — Telephone Encounter (Signed)
 Called and spoke to Samuel Reed at [facility]. Advised her that Dr Jimmy retired and he is not scheduled to see a new PCP until December. He was last seen 11/2022 so we do not have any new notes. Possibly one of his specialists could do it for him. She will contact the pt.

## 2023-11-10 NOTE — Addendum Note (Signed)
 Addended by: CLEOTILDE ROLIN RAMAN on: 11/10/2023 01:19 PM   Modules accepted: Orders

## 2023-12-22 ENCOUNTER — Ambulatory Visit (INDEPENDENT_AMBULATORY_CARE_PROVIDER_SITE_OTHER): Admitting: Podiatry

## 2023-12-22 ENCOUNTER — Encounter: Payer: Self-pay | Admitting: Podiatry

## 2023-12-22 DIAGNOSIS — L84 Corns and callosities: Secondary | ICD-10-CM

## 2023-12-22 DIAGNOSIS — M216X1 Other acquired deformities of right foot: Secondary | ICD-10-CM

## 2023-12-22 DIAGNOSIS — B351 Tinea unguium: Secondary | ICD-10-CM | POA: Diagnosis not present

## 2023-12-22 DIAGNOSIS — E0843 Diabetes mellitus due to underlying condition with diabetic autonomic (poly)neuropathy: Secondary | ICD-10-CM | POA: Diagnosis not present

## 2023-12-22 DIAGNOSIS — M79676 Pain in unspecified toe(s): Secondary | ICD-10-CM

## 2023-12-22 NOTE — Progress Notes (Signed)
 This patient returns to my office for at risk foot care.  This patient requires this care by a professional since this patient will be at risk due to having diabetes,venous stasis and CKD.  This patient is unable to cut nails himself since the patient cannot reach his nails.These nails are painful walking and wearing shoes. He has painful callus sub 3,4 right foot. This patient presents for at risk foot care today.  General Appearance  Alert, conversant and in no acute stress.  Vascular  Dorsalis pedis and posterior tibial  pulses are palpable  bilaterally.  Capillary return is within normal limits  bilaterally. Temperature is within normal limits  bilaterally.  Neurologic  Senn-Weinstein monofilament wire test diminished  bilaterally. Muscle power within normal limits bilaterally.  Nails Thick disfigured discolored nails with subungual debris  from hallux to fifth toes bilaterally. No evidence of bacterial infection or drainage bilaterally.  Orthopedic  No limitations of motion  feet .  No crepitus or effusions noted.  No bony pathology or digital deformities noted.  Skin  normotropic skin noted bilaterally.  No signs of infections or ulcers noted. Callus right forefoot.     Onychomycosis  Pain in right toes  Pain in left toes  Callus right foot.  Consent was obtained for treatment procedures.   Mechanical debridement of nails 1-5  bilaterally performed with a nail nipper.  Filed with dremel without incident. Debride callus with dremel tool.   Return office visit   12  weeks                  Told patient to return for periodic foot care and evaluation due to potential at risk complications.   Cordella Bold DPM

## 2023-12-27 ENCOUNTER — Ambulatory Visit

## 2023-12-27 VITALS — BP 118/82 | HR 67 | Temp 97.6°F | Ht 72.0 in | Wt 234.0 lb

## 2023-12-27 DIAGNOSIS — I10 Essential (primary) hypertension: Secondary | ICD-10-CM

## 2023-12-27 DIAGNOSIS — E785 Hyperlipidemia, unspecified: Secondary | ICD-10-CM

## 2023-12-27 DIAGNOSIS — Z0000 Encounter for general adult medical examination without abnormal findings: Secondary | ICD-10-CM

## 2023-12-27 DIAGNOSIS — K635 Polyp of colon: Secondary | ICD-10-CM

## 2023-12-27 NOTE — Progress Notes (Signed)
 Assessment & Plan:   This visit was conducted in person. The patient gave informed consent to the use of Abridge AI technology to record the contents of the encounter as documented below.  Assessment & Plan Physical Hx colonic polyps Up to date on most vaccinations. Discussed shingles vaccine due to potential renal transplant, currently on waiting list. Hepatitis A status unclear, per chart review only one dose was given. Pt deferring vaccine at this time, would prefer to check his personal records.  I have personally reviewed and have noted: 1. The patient's medical and social history 2. Their use of alcohol, tobacco or illicit drugs 3. Their current medications and supplements 4. The patient's functional ability including ADL's, fall risks, home safety risks and hearing or visual impairment. 5. Diet and physical activities 6. Evidence for depression or mood disorder   Colonoscopy: Done in September 2023, showed multiple precancerous polyps, repeat due in Sept 2026, referral sent AAA Screening: Will need at age 66, smoked tobacco through pipe for a few yrs, quit in 1990 Labs: Lipid ordered Immunizations: Needs shingles from pharmacy Diet and Exercise: Walk 3 times weekly 30 minutes, cut out processed foods Sleep and mood: No concerns Dental and vision: UTD ADLs and IADLs: Capable Cognitive: Intact to orientation, naming, recall and repetition Fall risk and home safety: No concerns Health counselling given: Counseled about exercise given current BMI and importance of fluid restriction given ESRD  - Recommended shingles vaccine series from pharmacy. - Verify hepatitis A vaccination status. - Scheduled fasting lab visit in one week. - Scheduled physical exam in one year.   Hypertension Blood pressure controlled at 118/82 mmHg with Lopressor  and Aldactone, no changes. - Continue Lopressor  100 mg daily. - Continue Aldactone 25 mg daily.  Hyperlipidemia Managed with  rosuvastatin . No recent cholesterol panel since 2022. - Ordered fasting lipid panel in one week. - Continue rosuvastatin  20 mg daily.   Follow-up: Return in about 1 year (around 12/26/2024) for CPE, fasting labs 1 wk from now.        Subjective:   Patient ID: Samuel Reed, male    DOB: 1960-12-20  Age: 63 y.o. MRN: [id number]  Patient Care Team: Bennett Reuben POUR, MD as PCP - General (Family Medicine) Court Dorn PARAS, MD as PCP - Cardiology (Cardiology) Center, [facility] [place], Wanda, RN as [facility] Care Management   CC:  Chief Complaint  Patient presents with   Transfer of Care    Previous pt of Dr Jimmy Aquas nephrologist regularly.     Samuel Reed is a 63 y.o. male here for annual physical and f/u on chronic conditions, see assessment and plan for further details  Discussed the use of AI scribe software for clinical note transcription with the patient, who gave verbal consent to proceed.  History of Present Illness Samuel Reed is a 63 year old male with end stage kidney disease who presents for an annual physical exam.  He has end stage kidney disease and is on the transplant waiting list. He performs peritoneal dialysis and uses gentamicin  ointment for catheter care. He experiences dehydration, affecting his voice, and is on a fluid restriction. His medications include Velphoro three times a day, torsemide  once daily, Aldactone 25 mg every morning, and calcitriol  three times a week.  He has type 2 diabetes and follows up with an endocrinologist. He uses Mounjaro  7.5 mg weekly and Lantus  65 units in the evening. He also uses a Libre sensor for glucose monitoring. He  has a history of a foot ulcer with bone removal and follows up with a podiatrist for foot care. No current swelling in his feet.  He has high cholesterol and takes rosuvastatin  20 mg daily. He has a history of high blood pressure and takes Lopressor  100 mg daily.  He experiences body aches,  particularly in his back, which sometimes affects his sleep. 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 He sleeps well most of the week, with difficulty falling asleep a couple of times a week due to pain.  He engages in physical activity by walking three times a week for about 30 minutes and performs mechanical work restoring antique tractors. He eats one full meal a day with snacks consisting of fruits or vegetables. He has a history of smoking a pipe with tobacco for a couple of years, quitting in 1990. He denies any current smoking or vaping.  He has regular dental and eye check-ups, including diabetic eye exams. He had a colonoscopy in 2023 with removal of precancerous polyps and is due for another in 2026. He is up to date with most vaccinations, including tetanus, RSV, pneumonia, influenza, and COVID booster. He is unsure about the completion of his hepatitis A vaccination series and has not received the shingles vaccine.    Depression Screening;    12/27/2023    1:58 PM 12/22/2022   10:08 AM 12/16/2021   10:39 AM 08/30/2020    7:32 AM 08/25/2019    8:07 AM 08/19/2018    9:56 AM 08/13/2017   11:17 AM  PHQ 2/9 Scores  PHQ - 2 Score 0 0 0 0 0 0 0     Anxiety Screening:     No data to display           ROS: Negative unless specifically indicated above in HPI.       Objective:     BP 118/82 (BP Location: Left Arm, Patient Position: Sitting, Cuff Size: Large)   Pulse 67   Temp 97.6 F (36.4 C) (Oral)   Ht 6' (1.829 m)   Wt 234 lb (106.1 kg)   SpO2 98%   BMI 31.74 kg/m    Physical Exam GENERAL: Alert, cooperative, well developed, no acute distress. Obese HEAD: Normocephalic atraumatic. EYES: Extraocular movements intact bilaterally, pupils round, equal and reactive to light bilaterally, conjunctivae normal bilaterally. EARS: Tympanic membrane, ear canal and external ear normal bilaterally. NOSE: No congestion or  rhinorrhea, mucous membranes are moist. THROAT: Oral cavity normal, no oropharyngeal exudate or posterior oropharyngeal erythema. CARDIOVASCULAR: Normal heart rate and rhythm, S1 and S2 normal without murmurs. CHEST: Clear to auscultation bilaterally, no wheezes, rhonchi, or crackles. No redness or swelling at catheter site. ABDOMEN: PD catheter in place, no signs of infection. Soft, non tender, non distended, without organomegaly, normal bowel sounds. EXTREMITIES: Extremities normal, no cyanosis or edema. NEUROLOGICAL: Oriented to person, place and time, no gait abnormalities, moves all extremities without gross motor or sensory deficit. NECK: Neck normal, thyroid  normal.        Emanual Lamountain K Samuel Novoa, MD  12/27/23    Contains text generated by Abridge AI Software.

## 2023-12-27 NOTE — Patient Instructions (Signed)
 Thank you for visiting [facility] today! Here's what we talked about: - Get shingles vaccine from pharmacy  - Check for second shot of hep A, if incomplete, let us  know

## 2023-12-29 ENCOUNTER — Other Ambulatory Visit: Payer: Self-pay | Admitting: Internal Medicine

## 2024-01-04 LAB — OPHTHALMOLOGY REPORT-SCANNED

## 2024-01-05 ENCOUNTER — Ambulatory Visit: Payer: Self-pay

## 2024-01-11 ENCOUNTER — Ambulatory Visit: Admitting: Internal Medicine

## 2024-01-11 ENCOUNTER — Other Ambulatory Visit

## 2024-01-11 ENCOUNTER — Encounter: Payer: Self-pay | Admitting: Internal Medicine

## 2024-01-11 VITALS — BP 120/70 | HR 82 | Ht 72.0 in | Wt 235.0 lb

## 2024-01-11 DIAGNOSIS — E1142 Type 2 diabetes mellitus with diabetic polyneuropathy: Secondary | ICD-10-CM | POA: Diagnosis not present

## 2024-01-11 DIAGNOSIS — Z7985 Long-term (current) use of injectable non-insulin antidiabetic drugs: Secondary | ICD-10-CM | POA: Diagnosis not present

## 2024-01-11 DIAGNOSIS — E785 Hyperlipidemia, unspecified: Secondary | ICD-10-CM

## 2024-01-11 DIAGNOSIS — E66811 Obesity, class 1: Secondary | ICD-10-CM

## 2024-01-11 DIAGNOSIS — E1165 Type 2 diabetes mellitus with hyperglycemia: Secondary | ICD-10-CM | POA: Diagnosis not present

## 2024-01-11 DIAGNOSIS — Z794 Long term (current) use of insulin: Secondary | ICD-10-CM | POA: Diagnosis not present

## 2024-01-11 LAB — POCT GLYCOSYLATED HEMOGLOBIN (HGB A1C): Hemoglobin A1C: 6.3 % — AB (ref 4.0–5.6)

## 2024-01-11 LAB — LIPID PANEL W/REFLEX DIRECT LDL
Cholesterol: 96 mg/dL (ref <200)
HDL: 33 mg/dL — ABNORMAL LOW (ref >=40)
LDL Cholesterol (Calc): 34 mg/dL
Non-HDL Cholesterol (Calc): 63 mg/dL (ref <130)
Total CHOL/HDL Ratio: 2.9 (calc) (ref <5.0)
Triglycerides: 248 mg/dL — ABNORMAL HIGH (ref <150)

## 2024-01-11 NOTE — Patient Instructions (Addendum)
 Please continue: - Lantus  65 units daily in the evening - Humalog  - inject 15 min before meals 10 units before b'fast  10-20 units before lunch 10-20 units before dinner - Mounjaro  7.5 mg weekly   Please return in 4 months.
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
## 2024-01-12 ENCOUNTER — Ambulatory Visit: Payer: Self-pay | Admitting: Internal Medicine

## 2024-02-17 ENCOUNTER — Ambulatory Visit

## 2024-03-22 ENCOUNTER — Ambulatory Visit: Admitting: Podiatry

## 2024-05-16 ENCOUNTER — Ambulatory Visit: Admitting: Internal Medicine

## 2024-07-03 ENCOUNTER — Ambulatory Visit
